# Patient Record
Sex: Female | Born: 1955 | Race: White | Hispanic: No | Marital: Single | Smoking: Current every day smoker
Health system: Southern US, Community
[De-identification: ages and names within clinical notes are randomized; demographics above are authoritative.]

## PROBLEM LIST (undated history)

## (undated) DIAGNOSIS — F329 Major depressive disorder, single episode, unspecified: Secondary | ICD-10-CM

## (undated) DIAGNOSIS — E119 Type 2 diabetes mellitus without complications: Secondary | ICD-10-CM

## (undated) DIAGNOSIS — Z96641 Presence of right artificial hip joint: Secondary | ICD-10-CM

## (undated) DIAGNOSIS — H3530 Unspecified macular degeneration: Secondary | ICD-10-CM

## (undated) DIAGNOSIS — G4730 Sleep apnea, unspecified: Secondary | ICD-10-CM

## (undated) DIAGNOSIS — M1990 Unspecified osteoarthritis, unspecified site: Secondary | ICD-10-CM

## (undated) DIAGNOSIS — G2581 Restless legs syndrome: Secondary | ICD-10-CM

## (undated) DIAGNOSIS — F419 Anxiety disorder, unspecified: Secondary | ICD-10-CM

## (undated) DIAGNOSIS — D649 Anemia, unspecified: Secondary | ICD-10-CM

## (undated) DIAGNOSIS — R51 Headache: Secondary | ICD-10-CM

## (undated) DIAGNOSIS — T8859XA Other complications of anesthesia, initial encounter: Secondary | ICD-10-CM

## (undated) DIAGNOSIS — G40909 Epilepsy, unspecified, not intractable, without status epilepticus: Secondary | ICD-10-CM

## (undated) DIAGNOSIS — F32A Depression, unspecified: Secondary | ICD-10-CM

## (undated) DIAGNOSIS — G4733 Obstructive sleep apnea (adult) (pediatric): Secondary | ICD-10-CM

## (undated) DIAGNOSIS — Z9889 Other specified postprocedural states: Secondary | ICD-10-CM

## (undated) DIAGNOSIS — T7840XA Allergy, unspecified, initial encounter: Secondary | ICD-10-CM

## (undated) DIAGNOSIS — R569 Unspecified convulsions: Secondary | ICD-10-CM

## (undated) DIAGNOSIS — S069XAA Unspecified intracranial injury with loss of consciousness status unknown, initial encounter: Secondary | ICD-10-CM

## (undated) DIAGNOSIS — M545 Low back pain, unspecified: Secondary | ICD-10-CM

## (undated) DIAGNOSIS — S0990XA Unspecified injury of head, initial encounter: Secondary | ICD-10-CM

## (undated) DIAGNOSIS — IMO0001 Reserved for inherently not codable concepts without codable children: Secondary | ICD-10-CM

## (undated) DIAGNOSIS — I4891 Unspecified atrial fibrillation: Secondary | ICD-10-CM

## (undated) DIAGNOSIS — Z8669 Personal history of other diseases of the nervous system and sense organs: Secondary | ICD-10-CM

## (undated) DIAGNOSIS — T4145XA Adverse effect of unspecified anesthetic, initial encounter: Secondary | ICD-10-CM

## (undated) DIAGNOSIS — G709 Myoneural disorder, unspecified: Secondary | ICD-10-CM

## (undated) DIAGNOSIS — J439 Emphysema, unspecified: Secondary | ICD-10-CM

## (undated) DIAGNOSIS — R519 Headache, unspecified: Secondary | ICD-10-CM

## (undated) DIAGNOSIS — K219 Gastro-esophageal reflux disease without esophagitis: Secondary | ICD-10-CM

## (undated) DIAGNOSIS — J449 Chronic obstructive pulmonary disease, unspecified: Secondary | ICD-10-CM

## (undated) DIAGNOSIS — I10 Essential (primary) hypertension: Secondary | ICD-10-CM

## (undated) DIAGNOSIS — S069X9A Unspecified intracranial injury with loss of consciousness of unspecified duration, initial encounter: Secondary | ICD-10-CM

## (undated) HISTORY — DX: Obstructive sleep apnea (adult) (pediatric): G47.33

## (undated) HISTORY — DX: Epilepsy, unspecified, not intractable, without status epilepticus: G40.909

## (undated) HISTORY — PX: TONSILLECTOMY: SUR1361

## (undated) HISTORY — DX: Unspecified convulsions: R56.9

## (undated) HISTORY — DX: Sleep apnea, unspecified: G47.30

## (undated) HISTORY — DX: Type 2 diabetes mellitus without complications: E11.9

## (undated) HISTORY — DX: Major depressive disorder, single episode, unspecified: F32.9

## (undated) HISTORY — DX: Personal history of other diseases of the nervous system and sense organs: Z86.69

## (undated) HISTORY — DX: Gastro-esophageal reflux disease without esophagitis: K21.9

## (undated) HISTORY — DX: Chronic obstructive pulmonary disease, unspecified: J44.9

## (undated) HISTORY — DX: Depression, unspecified: F32.A

## (undated) HISTORY — DX: Unspecified injury of head, initial encounter: S09.90XA

## (undated) HISTORY — DX: Unspecified osteoarthritis, unspecified site: M19.90

## (undated) HISTORY — DX: Allergy, unspecified, initial encounter: T78.40XA

## (undated) HISTORY — DX: Restless legs syndrome: G25.81

## (undated) HISTORY — PX: ROTATOR CUFF REPAIR: SHX139

## (undated) HISTORY — PX: ABDOMINAL HYSTERECTOMY: SHX81

## (undated) HISTORY — DX: Reserved for inherently not codable concepts without codable children: IMO0001

## (undated) HISTORY — PX: REVISION TOTAL HIP ARTHROPLASTY: SHX766

## (undated) HISTORY — DX: Emphysema, unspecified: J43.9

## (undated) HISTORY — DX: Myoneural disorder, unspecified: G70.9

## (undated) HISTORY — DX: Unspecified intracranial injury with loss of consciousness of unspecified duration, initial encounter: S06.9X9A

## (undated) HISTORY — DX: Anemia, unspecified: D64.9

## (undated) HISTORY — PX: VESICOVAGINAL FISTULA CLOSURE W/ TAH: SUR271

## (undated) HISTORY — DX: Low back pain: M54.5

## (undated) HISTORY — DX: Low back pain, unspecified: M54.50

## (undated) HISTORY — DX: Anxiety disorder, unspecified: F41.9

## (undated) HISTORY — PX: APPENDECTOMY: SHX54

## (undated) HISTORY — DX: Unspecified macular degeneration: H35.30

## (undated) HISTORY — DX: Unspecified intracranial injury with loss of consciousness status unknown, initial encounter: S06.9XAA

---

## 2000-03-25 ENCOUNTER — Other Ambulatory Visit: Admission: RE | Admit: 2000-03-25 | Discharge: 2000-03-25 | Payer: Self-pay | Admitting: Gynecology

## 2000-12-27 ENCOUNTER — Ambulatory Visit (HOSPITAL_COMMUNITY): Admission: RE | Admit: 2000-12-27 | Discharge: 2000-12-27 | Payer: Self-pay | Admitting: Family Medicine

## 2000-12-27 ENCOUNTER — Encounter: Payer: Self-pay | Admitting: Family Medicine

## 2001-01-18 ENCOUNTER — Ambulatory Visit (HOSPITAL_COMMUNITY): Admission: RE | Admit: 2001-01-18 | Discharge: 2001-01-18 | Payer: Self-pay | Admitting: Internal Medicine

## 2001-08-08 ENCOUNTER — Encounter: Payer: Self-pay | Admitting: Family Medicine

## 2001-08-08 ENCOUNTER — Ambulatory Visit (HOSPITAL_COMMUNITY): Admission: RE | Admit: 2001-08-08 | Discharge: 2001-08-08 | Payer: Self-pay | Admitting: Family Medicine

## 2001-08-09 ENCOUNTER — Encounter (HOSPITAL_COMMUNITY): Admission: RE | Admit: 2001-08-09 | Discharge: 2001-09-08 | Payer: Self-pay | Admitting: Oncology

## 2001-08-09 ENCOUNTER — Encounter: Admission: RE | Admit: 2001-08-09 | Discharge: 2001-08-09 | Payer: Self-pay | Admitting: Oncology

## 2001-11-08 ENCOUNTER — Encounter: Admission: RE | Admit: 2001-11-08 | Discharge: 2001-11-08 | Payer: Self-pay | Admitting: Oncology

## 2001-11-08 ENCOUNTER — Encounter (HOSPITAL_COMMUNITY): Admission: RE | Admit: 2001-11-08 | Discharge: 2001-12-08 | Payer: Self-pay | Admitting: Oncology

## 2001-12-27 ENCOUNTER — Encounter: Admission: RE | Admit: 2001-12-27 | Discharge: 2001-12-27 | Payer: Self-pay | Admitting: Oncology

## 2001-12-27 ENCOUNTER — Encounter (HOSPITAL_COMMUNITY): Admission: RE | Admit: 2001-12-27 | Discharge: 2002-01-26 | Payer: Self-pay | Admitting: Oncology

## 2002-02-09 ENCOUNTER — Encounter (HOSPITAL_COMMUNITY): Admission: RE | Admit: 2002-02-09 | Discharge: 2002-03-11 | Payer: Self-pay | Admitting: Oncology

## 2002-02-09 ENCOUNTER — Encounter: Admission: RE | Admit: 2002-02-09 | Discharge: 2002-02-09 | Payer: Self-pay | Admitting: Oncology

## 2002-07-17 ENCOUNTER — Ambulatory Visit (HOSPITAL_COMMUNITY): Admission: RE | Admit: 2002-07-17 | Discharge: 2002-07-17 | Payer: Self-pay | Admitting: Internal Medicine

## 2002-07-21 ENCOUNTER — Ambulatory Visit (HOSPITAL_COMMUNITY): Admission: RE | Admit: 2002-07-21 | Discharge: 2002-07-21 | Payer: Self-pay | Admitting: Internal Medicine

## 2002-10-12 ENCOUNTER — Encounter (HOSPITAL_COMMUNITY): Admission: RE | Admit: 2002-10-12 | Discharge: 2002-11-11 | Payer: Self-pay | Admitting: Oncology

## 2002-10-12 ENCOUNTER — Encounter: Admission: RE | Admit: 2002-10-12 | Discharge: 2002-10-12 | Payer: Self-pay | Admitting: Oncology

## 2002-10-13 ENCOUNTER — Encounter (INDEPENDENT_AMBULATORY_CARE_PROVIDER_SITE_OTHER): Payer: Self-pay | Admitting: Internal Medicine

## 2002-10-13 ENCOUNTER — Encounter: Payer: Self-pay | Admitting: Family Medicine

## 2002-10-13 ENCOUNTER — Ambulatory Visit (HOSPITAL_COMMUNITY): Admission: RE | Admit: 2002-10-13 | Discharge: 2002-10-13 | Payer: Self-pay | Admitting: Family Medicine

## 2002-10-18 ENCOUNTER — Encounter: Payer: Self-pay | Admitting: Orthopedic Surgery

## 2002-10-18 ENCOUNTER — Ambulatory Visit: Admission: RE | Admit: 2002-10-18 | Discharge: 2002-10-18 | Payer: Self-pay | Admitting: Orthopedic Surgery

## 2002-11-16 ENCOUNTER — Ambulatory Visit (HOSPITAL_COMMUNITY): Admission: RE | Admit: 2002-11-16 | Discharge: 2002-11-16 | Payer: Self-pay | Admitting: Internal Medicine

## 2002-11-16 HISTORY — PX: ESOPHAGOGASTRODUODENOSCOPY: SHX1529

## 2003-03-10 DIAGNOSIS — S0990XA Unspecified injury of head, initial encounter: Secondary | ICD-10-CM

## 2003-03-10 HISTORY — DX: Unspecified injury of head, initial encounter: S09.90XA

## 2003-03-29 ENCOUNTER — Ambulatory Visit (HOSPITAL_COMMUNITY): Admission: RE | Admit: 2003-03-29 | Discharge: 2003-03-29 | Payer: Self-pay | Admitting: Orthopedic Surgery

## 2003-04-04 ENCOUNTER — Encounter (HOSPITAL_COMMUNITY): Admission: RE | Admit: 2003-04-04 | Discharge: 2003-05-04 | Payer: Self-pay | Admitting: Orthopedic Surgery

## 2003-04-11 ENCOUNTER — Encounter: Payer: Self-pay | Admitting: Orthopedic Surgery

## 2003-04-11 ENCOUNTER — Observation Stay (HOSPITAL_COMMUNITY): Admission: RE | Admit: 2003-04-11 | Discharge: 2003-04-12 | Payer: Self-pay | Admitting: Orthopedic Surgery

## 2003-05-09 ENCOUNTER — Encounter (HOSPITAL_COMMUNITY): Admission: RE | Admit: 2003-05-09 | Discharge: 2003-06-08 | Payer: Self-pay | Admitting: Orthopedic Surgery

## 2003-05-09 ENCOUNTER — Encounter: Admission: RE | Admit: 2003-05-09 | Discharge: 2003-05-09 | Payer: Self-pay | Admitting: Oncology

## 2003-05-09 ENCOUNTER — Encounter (HOSPITAL_COMMUNITY): Admission: RE | Admit: 2003-05-09 | Discharge: 2003-06-08 | Payer: Self-pay | Admitting: Oncology

## 2003-06-11 ENCOUNTER — Encounter (HOSPITAL_COMMUNITY): Admission: RE | Admit: 2003-06-11 | Discharge: 2003-07-11 | Payer: Self-pay | Admitting: Orthopedic Surgery

## 2003-07-10 ENCOUNTER — Encounter (HOSPITAL_COMMUNITY): Admission: RE | Admit: 2003-07-10 | Discharge: 2003-08-09 | Payer: Self-pay | Admitting: Orthopedic Surgery

## 2003-07-19 ENCOUNTER — Other Ambulatory Visit: Admission: RE | Admit: 2003-07-19 | Discharge: 2003-07-19 | Payer: Self-pay | Admitting: Gynecology

## 2003-08-09 ENCOUNTER — Encounter (HOSPITAL_COMMUNITY): Admission: RE | Admit: 2003-08-09 | Discharge: 2003-09-08 | Payer: Self-pay | Admitting: Orthopedic Surgery

## 2003-09-11 ENCOUNTER — Encounter (HOSPITAL_COMMUNITY): Admission: RE | Admit: 2003-09-11 | Discharge: 2003-10-11 | Payer: Self-pay | Admitting: Orthopedic Surgery

## 2003-10-12 ENCOUNTER — Encounter (HOSPITAL_COMMUNITY): Admission: RE | Admit: 2003-10-12 | Discharge: 2003-11-11 | Payer: Self-pay | Admitting: Orthopedic Surgery

## 2003-11-26 ENCOUNTER — Encounter (HOSPITAL_COMMUNITY): Admission: RE | Admit: 2003-11-26 | Discharge: 2003-12-07 | Payer: Self-pay | Admitting: Oncology

## 2003-11-26 ENCOUNTER — Encounter: Admission: RE | Admit: 2003-11-26 | Discharge: 2003-12-07 | Payer: Self-pay | Admitting: Oncology

## 2003-12-06 ENCOUNTER — Ambulatory Visit (HOSPITAL_COMMUNITY): Admission: RE | Admit: 2003-12-06 | Discharge: 2003-12-06 | Payer: Self-pay | Admitting: Orthopedic Surgery

## 2003-12-24 ENCOUNTER — Encounter: Admission: RE | Admit: 2003-12-24 | Discharge: 2003-12-24 | Payer: Self-pay | Admitting: Orthopedic Surgery

## 2004-01-02 ENCOUNTER — Encounter: Payer: Self-pay | Admitting: Orthopedic Surgery

## 2004-01-07 ENCOUNTER — Encounter: Admission: RE | Admit: 2004-01-07 | Discharge: 2004-01-07 | Payer: Self-pay | Admitting: Orthopedic Surgery

## 2004-01-22 ENCOUNTER — Encounter: Admission: RE | Admit: 2004-01-22 | Discharge: 2004-01-22 | Payer: Self-pay | Admitting: Orthopedic Surgery

## 2004-10-06 ENCOUNTER — Emergency Department (HOSPITAL_COMMUNITY): Admission: EM | Admit: 2004-10-06 | Discharge: 2004-10-06 | Payer: Self-pay | Admitting: Emergency Medicine

## 2005-01-20 ENCOUNTER — Ambulatory Visit (HOSPITAL_COMMUNITY): Admission: RE | Admit: 2005-01-20 | Discharge: 2005-01-20 | Payer: Self-pay | Admitting: Family Medicine

## 2005-11-18 ENCOUNTER — Encounter: Admission: RE | Admit: 2005-11-18 | Discharge: 2005-12-04 | Payer: Self-pay | Admitting: Oncology

## 2005-11-18 ENCOUNTER — Encounter (HOSPITAL_COMMUNITY): Admission: RE | Admit: 2005-11-18 | Discharge: 2005-12-04 | Payer: Self-pay | Admitting: Oncology

## 2005-11-18 ENCOUNTER — Ambulatory Visit (HOSPITAL_COMMUNITY): Payer: Self-pay | Admitting: Oncology

## 2005-12-10 ENCOUNTER — Other Ambulatory Visit (HOSPITAL_COMMUNITY): Admission: RE | Admit: 2005-12-10 | Discharge: 2006-03-10 | Payer: Self-pay | Admitting: Psychiatry

## 2005-12-11 ENCOUNTER — Encounter (HOSPITAL_COMMUNITY): Admission: RE | Admit: 2005-12-11 | Discharge: 2006-01-10 | Payer: Self-pay | Admitting: Oncology

## 2005-12-11 ENCOUNTER — Encounter: Admission: RE | Admit: 2005-12-11 | Discharge: 2005-12-11 | Payer: Self-pay | Admitting: Oncology

## 2005-12-14 ENCOUNTER — Ambulatory Visit: Payer: Self-pay | Admitting: Psychiatry

## 2006-03-12 ENCOUNTER — Encounter (HOSPITAL_COMMUNITY): Admission: RE | Admit: 2006-03-12 | Discharge: 2006-04-11 | Payer: Self-pay | Admitting: Oncology

## 2006-03-12 ENCOUNTER — Ambulatory Visit (HOSPITAL_COMMUNITY): Payer: Self-pay | Admitting: Oncology

## 2006-04-20 ENCOUNTER — Ambulatory Visit: Payer: Self-pay | Admitting: Internal Medicine

## 2006-08-30 ENCOUNTER — Encounter (INDEPENDENT_AMBULATORY_CARE_PROVIDER_SITE_OTHER): Payer: Self-pay | Admitting: Internal Medicine

## 2007-02-07 HISTORY — PX: COLONOSCOPY WITH ESOPHAGOGASTRODUODENOSCOPY (EGD): SHX5779

## 2007-02-15 ENCOUNTER — Emergency Department (HOSPITAL_COMMUNITY): Admission: EM | Admit: 2007-02-15 | Discharge: 2007-02-15 | Payer: Self-pay | Admitting: Emergency Medicine

## 2007-02-15 ENCOUNTER — Ambulatory Visit: Payer: Self-pay | Admitting: Internal Medicine

## 2007-03-09 ENCOUNTER — Ambulatory Visit: Payer: Self-pay | Admitting: Internal Medicine

## 2007-03-09 ENCOUNTER — Ambulatory Visit (HOSPITAL_COMMUNITY): Admission: RE | Admit: 2007-03-09 | Discharge: 2007-03-09 | Payer: Self-pay | Admitting: Internal Medicine

## 2007-03-09 ENCOUNTER — Encounter: Payer: Self-pay | Admitting: Internal Medicine

## 2007-03-24 ENCOUNTER — Ambulatory Visit (HOSPITAL_COMMUNITY): Payer: Self-pay | Admitting: Oncology

## 2007-03-24 ENCOUNTER — Encounter (HOSPITAL_COMMUNITY): Admission: RE | Admit: 2007-03-24 | Discharge: 2007-04-23 | Payer: Self-pay | Admitting: Oncology

## 2007-03-28 ENCOUNTER — Ambulatory Visit: Payer: Self-pay | Admitting: Internal Medicine

## 2007-03-28 DIAGNOSIS — K219 Gastro-esophageal reflux disease without esophagitis: Secondary | ICD-10-CM

## 2007-03-28 DIAGNOSIS — M129 Arthropathy, unspecified: Secondary | ICD-10-CM | POA: Insufficient documentation

## 2007-03-28 DIAGNOSIS — F329 Major depressive disorder, single episode, unspecified: Secondary | ICD-10-CM

## 2007-03-28 DIAGNOSIS — J309 Allergic rhinitis, unspecified: Secondary | ICD-10-CM | POA: Insufficient documentation

## 2007-03-28 DIAGNOSIS — H3530 Unspecified macular degeneration: Secondary | ICD-10-CM

## 2007-03-28 DIAGNOSIS — S0990XA Unspecified injury of head, initial encounter: Secondary | ICD-10-CM | POA: Insufficient documentation

## 2007-03-28 DIAGNOSIS — J45909 Unspecified asthma, uncomplicated: Secondary | ICD-10-CM | POA: Insufficient documentation

## 2007-03-28 DIAGNOSIS — R5383 Other fatigue: Secondary | ICD-10-CM

## 2007-03-28 DIAGNOSIS — D649 Anemia, unspecified: Secondary | ICD-10-CM

## 2007-03-28 DIAGNOSIS — R5381 Other malaise: Secondary | ICD-10-CM | POA: Insufficient documentation

## 2007-03-28 DIAGNOSIS — M545 Low back pain: Secondary | ICD-10-CM

## 2007-03-28 DIAGNOSIS — F411 Generalized anxiety disorder: Secondary | ICD-10-CM | POA: Insufficient documentation

## 2007-03-28 DIAGNOSIS — R569 Unspecified convulsions: Secondary | ICD-10-CM

## 2007-03-28 DIAGNOSIS — R635 Abnormal weight gain: Secondary | ICD-10-CM

## 2007-03-29 ENCOUNTER — Telehealth (INDEPENDENT_AMBULATORY_CARE_PROVIDER_SITE_OTHER): Payer: Self-pay | Admitting: *Deleted

## 2007-03-30 LAB — CONVERTED CEMR LAB
ALT: 16 units/L (ref 0–35)
AST: 13 units/L (ref 0–37)
Albumin: 4.4 g/dL (ref 3.5–5.2)
Alkaline Phosphatase: 72 units/L (ref 39–117)
BUN: 21 mg/dL (ref 6–23)
Basophils Relative: 0 % (ref 0–1)
CO2: 18 meq/L — ABNORMAL LOW (ref 19–32)
Calcium: 9.5 mg/dL (ref 8.4–10.5)
Chloride: 103 meq/L (ref 96–112)
Creatinine, Ser: 0.7 mg/dL (ref 0.40–1.20)
Eosinophils Absolute: 0.1 10*3/uL (ref 0.0–0.7)
Eosinophils Relative: 1 % (ref 0–5)
Glucose, Bld: 134 mg/dL — ABNORMAL HIGH (ref 70–99)
HCT: 49.4 % — ABNORMAL HIGH (ref 36.0–46.0)
Hemoglobin: 16.6 g/dL — ABNORMAL HIGH (ref 12.0–15.0)
Lymphocytes Relative: 20 % (ref 12–46)
Lymphs Abs: 3.5 10*3/uL (ref 0.7–4.0)
MCHC: 33.6 g/dL (ref 30.0–36.0)
Monocytes Absolute: 0.8 10*3/uL (ref 0.1–1.0)
Monocytes Relative: 5 % (ref 3–12)
Neutro Abs: 13.2 10*3/uL — ABNORMAL HIGH (ref 1.7–7.7)
RBC: 5.2 M/uL — ABNORMAL HIGH (ref 3.87–5.11)
RDW: 14.5 % (ref 11.5–15.5)
Sodium: 136 meq/L (ref 135–145)
TSH: 1 microintl units/mL (ref 0.350–5.50)
Total Bilirubin: 0.4 mg/dL (ref 0.3–1.2)
Total Protein: 7.5 g/dL (ref 6.0–8.3)
WBC: 17.7 10*3/uL — ABNORMAL HIGH (ref 4.0–10.5)

## 2007-03-31 ENCOUNTER — Ambulatory Visit: Payer: Self-pay | Admitting: Orthopedic Surgery

## 2007-03-31 DIAGNOSIS — M5430 Sciatica, unspecified side: Secondary | ICD-10-CM

## 2007-03-31 DIAGNOSIS — M549 Dorsalgia, unspecified: Secondary | ICD-10-CM | POA: Insufficient documentation

## 2007-04-01 ENCOUNTER — Encounter (INDEPENDENT_AMBULATORY_CARE_PROVIDER_SITE_OTHER): Payer: Self-pay | Admitting: Internal Medicine

## 2007-04-06 ENCOUNTER — Encounter (INDEPENDENT_AMBULATORY_CARE_PROVIDER_SITE_OTHER): Payer: Self-pay | Admitting: Internal Medicine

## 2007-04-15 ENCOUNTER — Ambulatory Visit: Admission: RE | Admit: 2007-04-15 | Discharge: 2007-04-15 | Payer: Self-pay | Admitting: Internal Medicine

## 2007-04-15 ENCOUNTER — Encounter (INDEPENDENT_AMBULATORY_CARE_PROVIDER_SITE_OTHER): Payer: Self-pay | Admitting: Internal Medicine

## 2007-04-21 ENCOUNTER — Ambulatory Visit: Payer: Self-pay | Admitting: Orthopedic Surgery

## 2007-04-26 ENCOUNTER — Telehealth: Payer: Self-pay | Admitting: Orthopedic Surgery

## 2007-04-26 ENCOUNTER — Ambulatory Visit: Payer: Self-pay | Admitting: Pulmonary Disease

## 2007-04-28 ENCOUNTER — Ambulatory Visit (HOSPITAL_COMMUNITY): Admission: RE | Admit: 2007-04-28 | Discharge: 2007-04-28 | Payer: Self-pay | Admitting: Orthopedic Surgery

## 2007-05-10 ENCOUNTER — Telehealth (INDEPENDENT_AMBULATORY_CARE_PROVIDER_SITE_OTHER): Payer: Self-pay | Admitting: *Deleted

## 2007-05-16 ENCOUNTER — Ambulatory Visit: Payer: Self-pay | Admitting: Orthopedic Surgery

## 2007-05-16 DIAGNOSIS — M1610 Unilateral primary osteoarthritis, unspecified hip: Secondary | ICD-10-CM | POA: Insufficient documentation

## 2007-05-16 DIAGNOSIS — M169 Osteoarthritis of hip, unspecified: Secondary | ICD-10-CM | POA: Insufficient documentation

## 2007-05-17 ENCOUNTER — Encounter (INDEPENDENT_AMBULATORY_CARE_PROVIDER_SITE_OTHER): Payer: Self-pay | Admitting: Internal Medicine

## 2007-05-18 ENCOUNTER — Ambulatory Visit: Payer: Self-pay | Admitting: Internal Medicine

## 2007-05-18 DIAGNOSIS — G4730 Sleep apnea, unspecified: Secondary | ICD-10-CM | POA: Insufficient documentation

## 2007-05-18 DIAGNOSIS — G2581 Restless legs syndrome: Secondary | ICD-10-CM | POA: Insufficient documentation

## 2007-05-18 DIAGNOSIS — R519 Headache, unspecified: Secondary | ICD-10-CM | POA: Insufficient documentation

## 2007-05-18 DIAGNOSIS — R51 Headache: Secondary | ICD-10-CM | POA: Insufficient documentation

## 2007-05-19 ENCOUNTER — Ambulatory Visit (HOSPITAL_COMMUNITY): Admission: RE | Admit: 2007-05-19 | Discharge: 2007-05-19 | Payer: Self-pay | Admitting: Orthopedic Surgery

## 2007-06-06 ENCOUNTER — Ambulatory Visit: Payer: Self-pay | Admitting: Orthopedic Surgery

## 2007-06-08 ENCOUNTER — Telehealth: Payer: Self-pay | Admitting: Orthopedic Surgery

## 2007-06-13 ENCOUNTER — Ambulatory Visit (HOSPITAL_COMMUNITY): Admission: RE | Admit: 2007-06-13 | Discharge: 2007-06-13 | Payer: Self-pay | Admitting: Orthopedic Surgery

## 2007-06-29 ENCOUNTER — Ambulatory Visit: Payer: Self-pay | Admitting: Orthopedic Surgery

## 2007-06-29 DIAGNOSIS — M5126 Other intervertebral disc displacement, lumbar region: Secondary | ICD-10-CM

## 2007-07-12 ENCOUNTER — Telehealth: Payer: Self-pay | Admitting: Orthopedic Surgery

## 2007-07-18 ENCOUNTER — Encounter: Admission: RE | Admit: 2007-07-18 | Discharge: 2007-07-18 | Payer: Self-pay | Admitting: Orthopedic Surgery

## 2007-07-21 ENCOUNTER — Encounter: Payer: Self-pay | Admitting: Orthopedic Surgery

## 2007-07-27 ENCOUNTER — Telehealth (INDEPENDENT_AMBULATORY_CARE_PROVIDER_SITE_OTHER): Payer: Self-pay | Admitting: Internal Medicine

## 2007-08-22 ENCOUNTER — Ambulatory Visit: Payer: Self-pay | Admitting: Orthopedic Surgery

## 2007-09-23 ENCOUNTER — Encounter: Payer: Self-pay | Admitting: Orthopedic Surgery

## 2007-10-05 ENCOUNTER — Encounter: Payer: Self-pay | Admitting: Orthopedic Surgery

## 2007-10-07 ENCOUNTER — Encounter: Payer: Self-pay | Admitting: Orthopedic Surgery

## 2007-11-01 ENCOUNTER — Encounter: Payer: Self-pay | Admitting: Orthopedic Surgery

## 2007-11-29 ENCOUNTER — Ambulatory Visit: Payer: Self-pay | Admitting: Internal Medicine

## 2007-11-30 LAB — CONVERTED CEMR LAB
ALT: 25 units/L (ref 0–35)
AST: 18 units/L (ref 0–37)
Albumin: 4.1 g/dL (ref 3.5–5.2)
Alkaline Phosphatase: 65 units/L (ref 39–117)
BUN: 8 mg/dL (ref 6–23)
Basophils Absolute: 0.1 10*3/uL (ref 0.0–0.1)
Basophils Relative: 0 % (ref 0–1)
CO2: 23 meq/L (ref 19–32)
Calcium: 9.2 mg/dL (ref 8.4–10.5)
Chloride: 104 meq/L (ref 96–112)
Creatinine, Ser: 0.71 mg/dL (ref 0.40–1.20)
Eosinophils Absolute: 0.2 10*3/uL (ref 0.0–0.7)
Eosinophils Relative: 1 % (ref 0–5)
Glucose, Bld: 165 mg/dL — ABNORMAL HIGH (ref 70–99)
HCT: 49.8 % — ABNORMAL HIGH (ref 36.0–46.0)
Hemoglobin: 17.2 g/dL — ABNORMAL HIGH (ref 12.0–15.0)
Lymphocytes Relative: 28 % (ref 12–46)
Lymphs Abs: 3.3 10*3/uL (ref 0.7–4.0)
MCHC: 34.5 g/dL (ref 30.0–36.0)
MCV: 94 fL (ref 78.0–100.0)
Monocytes Absolute: 0.9 10*3/uL (ref 0.1–1.0)
Monocytes Relative: 8 % (ref 3–12)
Neutro Abs: 7.3 10*3/uL (ref 1.7–7.7)
Neutrophils Relative %: 63 % (ref 43–77)
Platelets: 309 10*3/uL (ref 150–400)
Potassium: 4.2 meq/L (ref 3.5–5.3)
RBC: 5.3 M/uL — ABNORMAL HIGH (ref 3.87–5.11)
RDW: 14.1 % (ref 11.5–15.5)
Sodium: 139 meq/L (ref 135–145)
Total Bilirubin: 0.4 mg/dL (ref 0.3–1.2)
Total Protein: 7 g/dL (ref 6.0–8.3)
WBC: 11.7 10*3/uL — ABNORMAL HIGH (ref 4.0–10.5)

## 2007-12-02 ENCOUNTER — Ambulatory Visit: Payer: Self-pay | Admitting: Internal Medicine

## 2007-12-02 DIAGNOSIS — E119 Type 2 diabetes mellitus without complications: Secondary | ICD-10-CM

## 2007-12-02 LAB — CONVERTED CEMR LAB: Blood Glucose, Fingerstick: 153

## 2007-12-13 ENCOUNTER — Telehealth: Payer: Self-pay | Admitting: Orthopedic Surgery

## 2007-12-16 ENCOUNTER — Encounter (INDEPENDENT_AMBULATORY_CARE_PROVIDER_SITE_OTHER): Payer: Self-pay | Admitting: Internal Medicine

## 2007-12-16 ENCOUNTER — Telehealth (INDEPENDENT_AMBULATORY_CARE_PROVIDER_SITE_OTHER): Payer: Self-pay | Admitting: Internal Medicine

## 2007-12-21 ENCOUNTER — Inpatient Hospital Stay (HOSPITAL_COMMUNITY): Admission: RE | Admit: 2007-12-21 | Discharge: 2007-12-25 | Payer: Self-pay | Admitting: Orthopedic Surgery

## 2007-12-26 ENCOUNTER — Telehealth (INDEPENDENT_AMBULATORY_CARE_PROVIDER_SITE_OTHER): Payer: Self-pay | Admitting: *Deleted

## 2008-01-20 ENCOUNTER — Encounter: Payer: Self-pay | Admitting: Orthopedic Surgery

## 2008-03-22 ENCOUNTER — Encounter (HOSPITAL_COMMUNITY): Admission: RE | Admit: 2008-03-22 | Discharge: 2008-04-21 | Payer: Self-pay | Admitting: Oncology

## 2008-03-23 ENCOUNTER — Ambulatory Visit: Payer: Self-pay | Admitting: Internal Medicine

## 2008-03-23 ENCOUNTER — Ambulatory Visit (HOSPITAL_COMMUNITY): Payer: Self-pay | Admitting: Oncology

## 2008-03-23 LAB — CONVERTED CEMR LAB
Blood Glucose, Fingerstick: 159
Hgb A1c MFr Bld: 7 %

## 2008-03-26 LAB — CONVERTED CEMR LAB
ALT: 21 units/L (ref 0–35)
AST: 17 units/L (ref 0–37)
Albumin: 4.1 g/dL (ref 3.5–5.2)
Alkaline Phosphatase: 74 units/L (ref 39–117)
BUN: 14 mg/dL (ref 6–23)
CO2: 19 meq/L (ref 19–32)
Calcium: 8.9 mg/dL (ref 8.4–10.5)
Chloride: 107 meq/L (ref 96–112)
Cholesterol: 215 mg/dL — ABNORMAL HIGH (ref 0–200)
Creatinine, Ser: 0.55 mg/dL (ref 0.40–1.20)
Glucose, Bld: 140 mg/dL — ABNORMAL HIGH (ref 70–99)
HDL: 34 mg/dL — ABNORMAL LOW (ref >39)
Potassium: 4.3 meq/L (ref 3.5–5.3)
Sodium: 143 meq/L (ref 135–145)
Total Bilirubin: 0.4 mg/dL (ref 0.3–1.2)
Total CHOL/HDL Ratio: 6.3
Total Protein: 6.9 g/dL (ref 6.0–8.3)
Triglycerides: 493 mg/dL — ABNORMAL HIGH (ref <150)

## 2008-05-02 ENCOUNTER — Ambulatory Visit: Payer: Self-pay | Admitting: Orthopedic Surgery

## 2008-05-02 DIAGNOSIS — M25819 Other specified joint disorders, unspecified shoulder: Secondary | ICD-10-CM | POA: Insufficient documentation

## 2008-05-02 DIAGNOSIS — M542 Cervicalgia: Secondary | ICD-10-CM | POA: Insufficient documentation

## 2008-05-02 DIAGNOSIS — M25519 Pain in unspecified shoulder: Secondary | ICD-10-CM

## 2008-05-02 DIAGNOSIS — M7580 Other shoulder lesions, unspecified shoulder: Secondary | ICD-10-CM

## 2008-05-08 ENCOUNTER — Telehealth: Payer: Self-pay | Admitting: Orthopedic Surgery

## 2008-05-10 ENCOUNTER — Ambulatory Visit (HOSPITAL_COMMUNITY): Admission: RE | Admit: 2008-05-10 | Discharge: 2008-05-10 | Payer: Self-pay | Admitting: Orthopedic Surgery

## 2008-05-21 ENCOUNTER — Ambulatory Visit: Payer: Self-pay | Admitting: Orthopedic Surgery

## 2008-06-29 ENCOUNTER — Encounter: Payer: Self-pay | Admitting: Orthopedic Surgery

## 2008-07-12 ENCOUNTER — Encounter: Payer: Self-pay | Admitting: Orthopedic Surgery

## 2008-07-18 ENCOUNTER — Emergency Department (HOSPITAL_COMMUNITY): Admission: EM | Admit: 2008-07-18 | Discharge: 2008-07-18 | Payer: Self-pay | Admitting: Emergency Medicine

## 2008-08-01 ENCOUNTER — Telehealth (INDEPENDENT_AMBULATORY_CARE_PROVIDER_SITE_OTHER): Payer: Self-pay | Admitting: *Deleted

## 2008-08-01 ENCOUNTER — Ambulatory Visit: Payer: Self-pay | Admitting: Internal Medicine

## 2008-10-24 ENCOUNTER — Ambulatory Visit: Payer: Self-pay | Admitting: Internal Medicine

## 2008-12-04 ENCOUNTER — Encounter (INDEPENDENT_AMBULATORY_CARE_PROVIDER_SITE_OTHER): Payer: Self-pay | Admitting: Internal Medicine

## 2008-12-31 ENCOUNTER — Ambulatory Visit: Payer: Self-pay | Admitting: Orthopedic Surgery

## 2009-01-08 ENCOUNTER — Telehealth: Payer: Self-pay | Admitting: Orthopedic Surgery

## 2009-01-16 ENCOUNTER — Encounter: Payer: Self-pay | Admitting: Orthopedic Surgery

## 2009-01-18 ENCOUNTER — Encounter: Payer: Self-pay | Admitting: Orthopedic Surgery

## 2009-01-21 ENCOUNTER — Encounter (HOSPITAL_COMMUNITY): Admission: RE | Admit: 2009-01-21 | Discharge: 2009-02-20 | Payer: Self-pay | Admitting: Orthopedic Surgery

## 2009-01-21 ENCOUNTER — Encounter: Payer: Self-pay | Admitting: Orthopedic Surgery

## 2009-02-11 ENCOUNTER — Ambulatory Visit: Payer: Self-pay | Admitting: Orthopedic Surgery

## 2009-02-15 ENCOUNTER — Telehealth: Payer: Self-pay | Admitting: Orthopedic Surgery

## 2009-02-20 ENCOUNTER — Ambulatory Visit (HOSPITAL_COMMUNITY): Admission: RE | Admit: 2009-02-20 | Discharge: 2009-02-20 | Payer: Self-pay | Admitting: Orthopedic Surgery

## 2009-02-22 ENCOUNTER — Telehealth: Payer: Self-pay | Admitting: Orthopedic Surgery

## 2009-02-25 ENCOUNTER — Telehealth: Payer: Self-pay | Admitting: Orthopedic Surgery

## 2009-02-26 ENCOUNTER — Telehealth: Payer: Self-pay | Admitting: Orthopedic Surgery

## 2009-03-06 ENCOUNTER — Ambulatory Visit: Payer: Self-pay | Admitting: Orthopedic Surgery

## 2009-03-06 DIAGNOSIS — M75120 Complete rotator cuff tear or rupture of unspecified shoulder, not specified as traumatic: Secondary | ICD-10-CM | POA: Insufficient documentation

## 2009-03-06 DIAGNOSIS — M19019 Primary osteoarthritis, unspecified shoulder: Secondary | ICD-10-CM | POA: Insufficient documentation

## 2009-03-15 ENCOUNTER — Ambulatory Visit: Payer: Self-pay | Admitting: Orthopedic Surgery

## 2009-03-15 ENCOUNTER — Encounter: Payer: Self-pay | Admitting: Orthopedic Surgery

## 2009-03-15 ENCOUNTER — Inpatient Hospital Stay (HOSPITAL_COMMUNITY): Admission: RE | Admit: 2009-03-15 | Discharge: 2009-03-18 | Payer: Self-pay | Admitting: Orthopedic Surgery

## 2009-03-15 ENCOUNTER — Telehealth (INDEPENDENT_AMBULATORY_CARE_PROVIDER_SITE_OTHER): Payer: Self-pay | Admitting: *Deleted

## 2009-03-21 ENCOUNTER — Ambulatory Visit: Payer: Self-pay | Admitting: Orthopedic Surgery

## 2009-03-28 ENCOUNTER — Ambulatory Visit: Payer: Self-pay | Admitting: Orthopedic Surgery

## 2009-04-05 ENCOUNTER — Telehealth: Payer: Self-pay | Admitting: Orthopedic Surgery

## 2009-04-22 ENCOUNTER — Ambulatory Visit: Payer: Self-pay | Admitting: Orthopedic Surgery

## 2009-05-08 ENCOUNTER — Ambulatory Visit (HOSPITAL_COMMUNITY): Payer: Self-pay | Admitting: Oncology

## 2009-05-08 ENCOUNTER — Encounter (HOSPITAL_COMMUNITY): Admission: RE | Admit: 2009-05-08 | Discharge: 2009-06-07 | Payer: Self-pay | Admitting: Oncology

## 2009-06-23 ENCOUNTER — Emergency Department (HOSPITAL_COMMUNITY): Admission: EM | Admit: 2009-06-23 | Discharge: 2009-06-24 | Payer: Self-pay | Admitting: Emergency Medicine

## 2009-07-03 ENCOUNTER — Ambulatory Visit: Payer: Self-pay | Admitting: Orthopedic Surgery

## 2009-09-04 ENCOUNTER — Telehealth: Payer: Self-pay | Admitting: Orthopedic Surgery

## 2009-10-01 ENCOUNTER — Encounter: Payer: Self-pay | Admitting: Orthopedic Surgery

## 2010-03-20 ENCOUNTER — Ambulatory Visit: Admit: 2010-03-20 | Payer: Self-pay | Admitting: Gastroenterology

## 2010-03-30 ENCOUNTER — Encounter: Payer: Self-pay | Admitting: Orthopedic Surgery

## 2010-04-03 ENCOUNTER — Ambulatory Visit
Admission: RE | Admit: 2010-04-03 | Discharge: 2010-04-03 | Payer: Self-pay | Source: Home / Self Care | Attending: Gastroenterology | Admitting: Gastroenterology

## 2010-04-03 ENCOUNTER — Encounter: Payer: Self-pay | Admitting: Internal Medicine

## 2010-04-03 DIAGNOSIS — K2270 Barrett's esophagus without dysplasia: Secondary | ICD-10-CM | POA: Insufficient documentation

## 2010-04-08 NOTE — Letter (Signed)
Summary: Medical record request [employer]  Medical record request [employer]   Imported By: Cammie Sickle 07/17/2009 13:15:02  _____________________________________________________________________  External Attachment:    Type:   Image     Comment:   External Document

## 2010-04-08 NOTE — Assessment & Plan Note (Signed)
Summary: 6 wk reck lt shuolder post op surg 03/15/09/bsf   Visit Type:  Follow-up  CC:  post op left shoulder.  History of Present Illness: status post LEFT open rotator cuff repair on March 15, 2009.  Current medications are Demerol and Robaxin.  The patient started a home therapy program and has done very well.  She status post RIGHT total hip arthroplasty by Dr. Malena Catholic.  She is seen him recently and was evaluated for LEFT groin pain x-rays were negative  He is doing very well she looks better she was able to receive her disability.  She has 35 of external rotation.  90 of abduction.  125 of forward elevation.  Her shoulder incision looks good.  She's improving recommend continued physical therapy refill Demerol   followup in 3 months       Allergies: 1)  ! Pcn 2)  Codeine   Impression & Recommendations:  Problem # 1:  AFTERCARE FOLLOW SURGERY MUSCULOSKEL SYSTEM NEC (ICD-V58.78) Assessment Improved  Orders: Post-Op Check (16109)  Problem # 2:  RUPTURE ROTATOR CUFF (ICD-727.61) Assessment: Comment Only  Orders: Post-Op Check (60454)  Problem # 3:  SHOULDER, ARTHRITIS, DEGEN./OSTEO (ICD-715.91) Assessment: Comment Only  Orders: Post-Op Check (09811)  Medications Added to Medication List This Visit: 1)  Ritalin 10 Mg Tabs (Methylphenidate hcl) 2)  Alprazolam 1 Mg Tabs (Alprazolam) .... One by mouth tid  Patient Instructions: 1)  Please schedule a follow-up appointment in 3 months. 2)  Continue Home Exercise program  Prescriptions: DEMEROL 50 MG TABS (MEPERIDINE HCL) one by mouth q 6 hrs as needed pain  #180 x 0   Entered and Authorized by:   Fuller Canada MD   Signed by:   Fuller Canada MD on 07/03/2009   Method used:   Print then Give to Patient   RxID:   [id number]

## 2010-04-08 NOTE — Progress Notes (Signed)
Summary: wants prescription for Demerol  Phone Note Call from Patient   Summary of Call: Adrienne [name] (22-Jun-1955)  You wrote a prescription for Demerol #180 but [facility] would only fill for #120 due to insurance. Now they will not refill for the 60 remaining pills on that prescription.  She needs a new written prescription for the Demerol. [name] in [place] Her # [phone] Initial call taken by: Jacklynn Ganong,  September 04, 2009 2:57 PM  Follow-up for Phone Call        ok check with pharmacy then refill Follow-up by: Fuller Canada MD,  September 04, 2009 2:57 PM  Additional Follow-up for Phone Call Additional follow up Details #1::        done Additional Follow-up by: Fuller Canada MD,  September 04, 2009 4:07 PM

## 2010-04-08 NOTE — Letter (Signed)
Summary: Medical record request [employer]  Medical record request [employer]   Imported By: Cammie Sickle 03/12/2009 18:17:59  _____________________________________________________________________  External Attachment:    Type:   Image     Comment:   External Document

## 2010-04-08 NOTE — Letter (Signed)
Summary: No show for appointment  No show for appointment   Imported By: Cammie Sickle 10/08/2009 18:10:44  _____________________________________________________________________  External Attachment:    Type:   Image     Comment:   External Document

## 2010-04-08 NOTE — Assessment & Plan Note (Signed)
Summary: POST OP 2/SURG 03/15/09/REM STAPLES/[employer]/CAF   Visit Type:  Follow-up  CC:  postop visit.  History of Present Illness: I saw Adrienne [name] in the office today for a followup visit.  She is a 55 years old woman with the complaint of:  DOS left shoulder 03/15/09.  Procedure    Arthroscopy of left shoulder, open left rotator cuff repair with removal of distal clavicle.  Medication Demerol, Robaxin, Duragesic patch.  Subjectives postop 2 removal of staples.  Adrienne [name] is improving.  She has less pain less swelling.  She is using the Cryo/Cuff and doing much better.  Incision looks good staples were removed  Continue sling follow up 2 weeks and we will start some therapy at home  Allergies: 1)  ! Pcn 2)  Codeine   Impression & Recommendations:  Problem # 1:  AFTERCARE FOLLOW SURGERY MUSCULOSKEL SYSTEM NEC (ICD-V58.78) Assessment Improved  Orders: Post-Op Check (16109)  Medications Added to Medication List This Visit: 1)  Robaxin 500 Mg Tabs (Methocarbamol) .[name].. 1 by mouth q 6 as needed  Patient Instructions: 1)  Please schedule a follow-up appointment in 2 weeks. 2)  start Home program next time ! Prescriptions: DEMEROL 50 MG TABS (MEPERIDINE HCL) one by mouth q 6 hrs as needed pain  #90 x 0   Entered and Authorized by:   Fuller Canada MD   Signed by:   Fuller Canada MD on 03/28/2009   Method used:   Print then Give to Patient   RxID:   [id number] ROBAXIN 500 MG TABS (METHOCARBAMOL) 1 by mouth q 6 as needed  #90 x 5   Entered and Authorized by:   Fuller Canada MD   Signed by:   Fuller Canada MD on 03/28/2009   Method used:   Print then Give to Patient   RxID:   [id number]

## 2010-04-08 NOTE — Progress Notes (Signed)
Summary: Pre-authorization for out-patient procedure  Phone Note Outgoing Call   Call placed to: Insurer Summary of Call: On 1/6j/11 - Contacted [employer] insurance re: out-patient surgery re: pre-authorization. Per online system, auth reference # [id number] Initial call taken by: Cammie Sickle,  March 15, 2009 12:45 PM

## 2010-04-08 NOTE — Progress Notes (Signed)
Summary: Pt requests postpone of appointment for a week  Phone Note Call from Patient   Caller: Patient Summary of Call: Patient called to request to postpone her 04/15/09 post op appt to 04/22/09, as she "has a chance to go to her sister's, out of town".  States doing well and continuing prescribed treatment. Called to advise If any problems.   Her sister's phone # for our records:  [phone] Initial call taken by: Cammie Sickle,  April 05, 2009 10:52 AM  Follow-up for Phone Call        ok Follow-up by: Ether Griffins,  April 08, 2009 8:38 AM

## 2010-04-08 NOTE — Assessment & Plan Note (Signed)
Summary: 2 wk reck left shoulder/post op surg 03/15/09/bcbs/bsf   Visit Type:  Follow-up  CC:  post op left shoulder.[name]  History of Present Illness: I saw Adrienne [name] in the office today for a followup visit.  She is a 55 years old woman with the complaint of:  post op left shoulder.  DEMEROL  ROBAXIN  DOS  03-15-09.  5 WEEKS POST OP   START HOME PROGRAM TODAY   FILED FOR DISABILITY DUE TO HEAD INJURY   LEFT shoulder continues to improve with decreased swelling improved motion  Recommend a home exercise program follow up 6 weeks continue Demerol for pain  Allergies: 1)  ! Pcn 2)  Codeine   Impression & Recommendations:  Problem # 1:  AFTERCARE FOLLOW SURGERY MUSCULOSKEL SYSTEM NEC (ICD-V58.78)  Orders: Post-Op Check (16109)  Problem # 2:  RUPTURE ROTATOR CUFF (ICD-727.61)  Orders: Post-Op Check (60454)  Patient Instructions: 1)  START HOME EXERCISES 2)  F/U IN 6 WEEKS   Prescriptions: DEMEROL 50 MG TABS (MEPERIDINE HCL) one by mouth q 6 hrs as needed pain  #90 x 0   Entered and Authorized by:   Fuller Canada MD   Signed by:   Fuller Canada MD on 04/22/2009   Method used:   Print then Give to Patient   RxID:   [id number]

## 2010-04-08 NOTE — Assessment & Plan Note (Signed)
Summary: POST OP 1/LT SHOULDER/SURG 03/15/09/[employer]/CAF   Visit Type:  postop visit  CC:  postop visit.  History of Present Illness: I saw Adrienne [name] in the office today for a followup visit.  She is a 55 years old woman with the complaint of:  POST OP LEFT SHOULDER.  DOS   03-15-09.  Procedure   Arthroscopy of left shoulder, open left rotator cuff repair with removal of distal clavicle.  Medication  she is on a Duragesic patch, Percocet, Robaxin.  Subjectives  Patient states that her shoulder hurts., she just can't get comfortable.  The incision looks good no redness no drainage.  The shoulder looks good with minimal swelling.  Normal function in the LEFT hand.  Doing reasonably well.  She does have a history of being narcotic tolerance so I'm not surprised about the pain  Recommend sling rest ice take medications as prescribed followup next week for staples out and discuss when to start physical therapy   Allergies: 1)  ! 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

## 2010-04-10 NOTE — Letter (Signed)
Summary: EGD ORDER  EGD ORDER   Imported By: Ave Filter 04/03/2010 12:10:35  _____________________________________________________________________  External Attachment:    Type:   Image     Comment:   External Document  Appended Document: EGD ORDER Pre-op:04/18/10@10 :00a.m.

## 2010-04-10 NOTE — Assessment & Plan Note (Signed)
Summary: CONSULT FOR EGD/SS   Visit Type:  Follow-up Visit Primary Care Provider:  Erle Crocker MD  CC:  needs egd.  History of Present Illness: [name] presents today for a visit prior to EGD for surveillance, hx of Barrett's. Last EGD was in December 2008, +Barrett's, +gastritis, colonoscopy done at same time showed friable anal canal, hyperplastic polyp. Next TCS due in  2018.  Denies epigastric pain, dysphagia or odynophagia. No N/V. Rare reflux, takes omeprazole daily which controls her symptoms. +BM daily, no melena or hematochezia.      Current Medications (verified): 1)  Lamictal 100 Mg Tabs (Lamotrigine) .... Take 1 Tablet By Mouth Once A Day 2)  Lunesta 3 Mg Tabs (Eszopiclone) .... Take 1 Tablet By Mouth Once A Day [facility] 3)  Pristiq 100 Mg Xr24h-Tab (Desvenlafaxine Succinate) .[name].. 1 By Mouth Once Daily 4)  Aspirin 81 Mg  Tbec (Aspirin) .[name].. 1 By Mouth Once Daily 5)  Multivitamins   Tabs (Multiple Vitamin) .[name].. 1 By Mouth Once Daily 6)  Omeprazole 20 Mg  Cpdr (Omeprazole) .[name].. 1 By Mouth Once Daily 7)  Melatonin 3 Mg Tabs (Melatonin) .[name].. 1 By Mouth At Bedtime 8)  Fish Oil  Oil (Fish Oil) .... Take 1 Tablet By Mouth Once A Day 9)  Flax  Oil (Flaxseed (Linseed)) .... Take 1 Tablet By Mouth Once A Day 10)  Lithium Carbonate 300 Mg Caps (Lithium Carbonate) .... Take 1 Tablet By Mouth Two Times A Day 11)  Cinnamon 500 Mg Tabs (Cinnamon) .... Take 1 Tablet By Mouth Once A Day 12)  Vitamin D3 400 Unit Tabs (Cholecalciferol) .... Take 1 Tablet By Mouth Once A Day 13)  Ritalin 10 Mg Tabs (Methylphenidate Hcl) 14)  Alprazolam 1 Mg Tabs (Alprazolam) .... One By Mouth Tid 15)  B-12 .... Once Daily 16)  Vit E .... Once Daily  Allergies: 1)  ! Pcn 2)  ! Sulfa 3)  Codeine  Past History:  Past Medical History: Allergic rhinitis Anemia/leukocytosis/thrombocytosis--Dr. Mariel Sleet Asthma Depression/ ? bipolar disorder--Dr. Mayford Knife GERD/Barrett's esophagus Low back  pain Seizure disorder--? absence Dry macular degeneration bilaterally--Dr. Lita Mains arthritis migraines, hx closed head brain injury OSA RLS NIDDM TCS/EGD 02/2007:, +Barrett's, +gastritis,  friable anal canal, hyperplastic polyp. Next TCS due in  2018.   Past Surgical History: Appendectomy Hysterectomy  rotator cuff repair bilaterally Tonsillectomy colonoscopy with biopsy--03/09/07 EGD with biopsy--03/09/07 R. THA 10/09  Family History: father-deceased-50-cancer, MI mother-79-heart disease, "intestinal cancer," OSA sister x1 child-26 child-28-GERD child-18 MGF, maternal aunt and uncle with DM.   No FH of Colon Cancer:  Social History: Married but separated Disability: secondary to head injury, lives with son now Current Smoker-1ppd x25 years Alcohol use-no Drug use-no  Review of Systems General:  Denies fever, chills, and anorexia. Eyes:  Denies blurring, irritation, and discharge. ENT:  Denies sore throat, hoarseness, and difficulty swallowing. CV:  Denies chest pains and syncope. Resp:  Denies dyspnea at rest and wheezing. GI:  Denies difficulty swallowing, pain on swallowing, nausea, abdominal pain, constipation, change in bowel habits, bloody BM's, and black BMs. GU:  Denies urinary burning and urinary frequency. MS:  Denies joint pain / LOM, joint swelling, and joint stiffness. Derm:  Denies rash, itching, and dry skin. Neuro:  Denies weakness and syncope. Psych:  Denies depression and anxiety. Endo:  Denies cold intolerance and heat intolerance.  Vital Signs:  Patient profile:   55 year old female Height:      65.5 inches Weight:  170 pounds BMI:     27.96 Temp:     98.5 degrees F oral Pulse rate:   64 / minute BP sitting:   118 / 84  (left arm) Cuff size:   regular  Vitals Entered By: Hendricks Limes LPN (April 03, 2010 11:13 AM)  Physical Exam  General:  Well developed, well nourished, no acute distress. Head:  Normocephalic and  atraumatic. Eyes:  PERRLA, no icterus. Mouth:  No deformity or lesions, dentition normal. Lungs:  Clear throughout to auscultation. Heart:  Regular rate and rhythm; no murmurs, rubs,  or bruits. Abdomen:  +BS, soft, non-tender, non-distended, no HSM. no rebound or guarding.  Msk:  Symmetrical with no gross deformities. Normal posture. Pulses:  Normal pulses noted. Extremities:  No clubbing, cyanosis, edema or deformities noted. Neurologic:  Alert and  oriented x4;  grossly normal neurologically. Psych:  Alert and cooperative. Normal mood and affect.  Impression & Recommendations:  Problem # 1:  BARRETT'S ESOPHAGUS (ICD-54.4)  55 year old Caucasian female due for surveillance EGD secondary to Barrett's. Last done in 2008. Denies any problems at this time. No N/V, abdominal pain, no dysphagia/odynophagia. On omeprazole daily, reflux controlled.   EGD to be performed by Dr. Jena Gauss in near future: the R/B/A have been discussed in detail with the pt; states understanding and wishes to proceed.  Orders: Est. Patient Level II (04540)

## 2010-04-18 ENCOUNTER — Other Ambulatory Visit: Payer: Self-pay | Admitting: Internal Medicine

## 2010-04-18 ENCOUNTER — Encounter (HOSPITAL_COMMUNITY): Payer: BC Managed Care – PPO | Attending: Internal Medicine

## 2010-04-18 DIAGNOSIS — Z01812 Encounter for preprocedural laboratory examination: Secondary | ICD-10-CM | POA: Insufficient documentation

## 2010-04-18 DIAGNOSIS — Z01810 Encounter for preprocedural cardiovascular examination: Secondary | ICD-10-CM | POA: Insufficient documentation

## 2010-04-18 LAB — BASIC METABOLIC PANEL
BUN: 7 mg/dL (ref 6–23)
CO2: 30 mEq/L (ref 19–32)
Calcium: 9.8 mg/dL (ref 8.4–10.5)
Chloride: 100 mEq/L (ref 96–112)
Creatinine, Ser: 0.57 mg/dL (ref 0.4–1.2)
GFR calc Af Amer: >60 mL/min (ref >60)
GFR calc non Af Amer: >60 mL/min (ref >60)
Glucose, Bld: 108 mg/dL — ABNORMAL HIGH (ref 70–99)
Potassium: 4.7 mEq/L (ref 3.5–5.1)

## 2010-04-18 LAB — HEMOGLOBIN AND HEMATOCRIT, BLOOD
HCT: 48.3 % — ABNORMAL HIGH (ref 36.0–46.0)
Hemoglobin: 16.4 g/dL — ABNORMAL HIGH (ref 12.0–15.0)

## 2010-04-24 ENCOUNTER — Encounter: Payer: Self-pay | Admitting: Internal Medicine

## 2010-04-24 ENCOUNTER — Ambulatory Visit (HOSPITAL_COMMUNITY)
Admission: RE | Admit: 2010-04-24 | Payer: BC Managed Care – PPO | Source: Ambulatory Visit | Admitting: Internal Medicine

## 2010-05-07 ENCOUNTER — Other Ambulatory Visit (HOSPITAL_COMMUNITY): Payer: BC Managed Care – PPO

## 2010-05-07 ENCOUNTER — Encounter (HOSPITAL_COMMUNITY): Payer: BC Managed Care – PPO

## 2010-05-09 ENCOUNTER — Ambulatory Visit (HOSPITAL_COMMUNITY): Payer: BC Managed Care – PPO | Admitting: Oncology

## 2010-05-25 LAB — BASIC METABOLIC PANEL
CO2: 28 mEq/L (ref 19–32)
Calcium: 9.2 mg/dL (ref 8.4–10.5)
Chloride: 101 mEq/L (ref 96–112)
Creatinine, Ser: 0.57 mg/dL (ref 0.4–1.2)
GFR calc Af Amer: >60 mL/min (ref >60)
GFR calc non Af Amer: >60 mL/min (ref >60)
Glucose, Bld: 136 mg/dL — ABNORMAL HIGH (ref 70–99)
Potassium: 3.9 mEq/L (ref 3.5–5.1)
Sodium: 138 mEq/L (ref 135–145)

## 2010-05-25 LAB — HEMOGLOBIN AND HEMATOCRIT, BLOOD
HCT: 46.5 % — ABNORMAL HIGH (ref 36.0–46.0)
Hemoglobin: 15.9 g/dL — ABNORMAL HIGH (ref 12.0–15.0)

## 2010-05-27 LAB — RAPID URINE DRUG SCREEN, HOSP PERFORMED
Barbiturates: NOT DETECTED
Cocaine: NOT DETECTED
Opiates: NOT DETECTED

## 2010-05-27 LAB — DIFFERENTIAL
Basophils Absolute: 0.1 10*3/uL (ref 0.0–0.1)
Basophils Relative: 1 % (ref 0–1)
Eosinophils Absolute: 0 10*3/uL (ref 0.0–0.7)
Neutro Abs: 17.4 10*3/uL — ABNORMAL HIGH (ref 1.7–7.7)
Neutrophils Relative %: 78 % — ABNORMAL HIGH (ref 43–77)

## 2010-05-27 LAB — BASIC METABOLIC PANEL
BUN: 12 mg/dL (ref 6–23)
Calcium: 9.8 mg/dL (ref 8.4–10.5)
Chloride: 102 mEq/L (ref 96–112)
Creatinine, Ser: 0.75 mg/dL (ref 0.4–1.2)
GFR calc Af Amer: >60 mL/min (ref >60)
GFR calc non Af Amer: >60 mL/min (ref >60)
Glucose, Bld: 155 mg/dL — ABNORMAL HIGH (ref 70–99)
Sodium: 137 mEq/L (ref 135–145)

## 2010-05-27 LAB — CBC
HCT: 48.7 % — ABNORMAL HIGH (ref 36.0–46.0)
Hemoglobin: 17.1 g/dL — ABNORMAL HIGH (ref 12.0–15.0)
MCHC: 35.1 g/dL (ref 30.0–36.0)
Platelets: 285 10*3/uL (ref 150–400)
RDW: 13.4 % (ref 11.5–15.5)

## 2010-06-01 LAB — DIFFERENTIAL
Eosinophils Absolute: 0.1 10*3/uL (ref 0.0–0.7)
Lymphs Abs: 2.9 10*3/uL (ref 0.7–4.0)
Monocytes Absolute: 0.6 10*3/uL (ref 0.1–1.0)
Monocytes Relative: 7 % (ref 3–12)
Neutrophils Relative %: 60 % (ref 43–77)

## 2010-06-01 LAB — CBC
HCT: 48.3 % — ABNORMAL HIGH (ref 36.0–46.0)
Hemoglobin: 16.5 g/dL — ABNORMAL HIGH (ref 12.0–15.0)
MCV: 95.4 fL (ref 78.0–100.0)
RBC: 5.06 MIL/uL (ref 3.87–5.11)
WBC: 9.1 10*3/uL (ref 4.0–10.5)

## 2010-06-23 LAB — DIFFERENTIAL
Basophils Relative: 1 % (ref 0–1)
Eosinophils Absolute: 0.2 10*3/uL (ref 0.0–0.7)
Eosinophils Relative: 1 % (ref 0–5)
Lymphs Abs: 2.8 10*3/uL (ref 0.7–4.0)
Monocytes Relative: 5 % (ref 3–12)

## 2010-06-23 LAB — CBC
HCT: 49.8 % — ABNORMAL HIGH (ref 36.0–46.0)
MCHC: 32.9 g/dL (ref 30.0–36.0)
MCV: 90.6 fL (ref 78.0–100.0)
Platelets: 300 10*3/uL (ref 150–400)
WBC: 11.7 10*3/uL — ABNORMAL HIGH (ref 4.0–10.5)

## 2010-07-22 NOTE — H&P (Signed)
NAME:  Adrienne [name], Adrienne [name]                ACCOUNT NO.:  [id number]   MEDICAL RECORD NO.:  [id number]          PATIENT TYPE:  AMB   LOCATION:  DAY                           FACILITY:  [facility]   PHYSICIAN:  R. Roetta Sessions, M.D. DATE OF BIRTH:  1955/07/10   DATE OF ADMISSION:  DATE OF DISCHARGE:                              HISTORY & PHYSICAL   This is a lady with a history of known Barrett's esophagus.  Last EGD  2004.  She has had some intermittent rectal bleeding.  We saw her in the  office on April 20, 2006, and scheduled her for both EGD and  colonoscopy, but she decided not to have it done.  She has continued  intermittent rectal bleeding, no constipation or diarrhea.  Has not had  any abdominal pain.  Reflux symptoms well controlled on Prilosec 20 mg  orally a day.  She did not have any dysphagia.  She has gained 24 pounds  since we saw her in February.  Mother made this appointment and insisted  she follow through and get the recommended evaluations.  She did trip  and fall at home, has had some right lower extremity pain and some left  rib pain three weeks but has not seen her primary care physician.   CURRENT MEDICATIONS:  1. Zoloft 200 mg at bedtime.  2. Clonazepam 0.5 mg q.i.d.  3. Aspirin 81 mg daily.  4. Multivitamin daily.  5. Prilosec 20 mg orally daily.  6. Depakote daily.  7. Lamisil 2.5 mg daily.  8. Meloxicam p.r.n.  9. Seroquel.  10.Provigil.   ALLERGIES:  1. CODEINE  2. PENICILLIN.   PAST MEDICAL HISTORY:  1. Depression.  2. Gastroesophageal reflux disease with Barrett's esophagus.  3. History of small hiatal hernia.  4. Restless leg syndrome.  5. Chronic migraines.   PAST SURGICAL HISTORY:  1. Appendectomy.  2. Tonsillectomy.  3. Hysterectomy.   FAMILY HISTORY:  Negative for chronic GI or liver illness.   SOCIAL HISTORY:  Patient is married, three children.  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  She has a 30-pack-year tobacco use.  Rarely consumes alcoholic beverages.  Denies any illicit drug use.   REVIEW OF SYSTEMS:  No chest pain, dyspnea on exertion, weight gain as  outlined above.  No fever or chills.   PHYSICAL EXAMINATION:  VITAL SIGNS:  Weight 196, height 5 feet 5,  temperature 97.8, blood pressure 140/70, pulse 72.  GENERAL APPEARANCE:  A 55 year old lady resting comfortably.  SKIN:  Warm and dry.  No jaundice.  HEENT:  No scleral icterus.  Conjunctivae are pink.  CHEST:  Lungs are clear to auscultation.  CARDIOVASCULAR:  Regular rate and rhythm without murmurs, rubs, or  gallops.  ABDOMEN:  Nondistended, positive bowel sounds, soft and nontender  without appreciable mass or organomegaly.  RECTAL:  Exam deferred to time of colonoscopy.   IMPRESSION:  Adrienne [name] is a pleasant 55 year old lady who has  known Barrett's esophagus and is somewhat  overdue for surveillance.  She  failed to follow through on coming for her EGD early this year.  In  addition, she has intermittent hematochezia.  Colonoscopy has also been  recommended which she has put off.  We talked about getting back on  track and performing an EGD and colonoscopy in the near future.  Questions were answered.  She is agreeable.  Plan to perform EGD and  colonoscopy in the very near future.   She does have some left rib pain and some right lower extremity pain  which I deferred to evaluate and I recommend that she really ought to go  ahead and see Dr. Regino Schultze in the very near future and get those issues  sorted out prior to EGD and colonoscopy which, at her request, will be  scheduled just after Christmas.      Jonathon Bellows, M.D.  Electronically Signed     RMR/MEDQ  D:  02/15/2007  T:  02/16/2007  Job:  [id number]   cc:   Kirk Ruths, M.D.  Fax: [phone]

## 2010-07-22 NOTE — H&P (Signed)
NAMEAZALYA, GALYON NO.:  [id number]   MEDICAL RECORD NO.:  [id number]          PATIENT TYPE:  INP   LOCATION:  NA                           FACILITY:  [facility]   PHYSICIAN:  Ollen Gross, M.D.    DATE OF BIRTH:  [birth year]   DATE OF ADMISSION:  12/21/2007  DATE OF DISCHARGE:                              HISTORY & PHYSICAL   CHIEF COMPLAINT:  Right hip pain.   HISTORY OF PRESENT ILLNESS:  Patient is a 55 year old female who has  been seen by Dr. Lequita Halt for ongoing right hip pain that has been  ongoing since earlier this year.  She had a slip and a fall.  She had  immediate onset of significant pain, marked increased pain and  dysfunction since February.  She has been seen in the office where pain  is in the groin radiating to her anterior thigh.  X-rays show rapidly  progressive arthritis that has been brought on by the fall.  It is felt  that due to the rapid progression she would benefit from undergoing  surgical intervention.  Risks and benefits discussed.  Patient  __________ hospital.  She has been seen by Dr. Jen Mow preoperatively and  felt to be stable.  She did say she had a low threshold to start  albuterol nebulizers and steroids postoperatively for any wheezing due  to her history of asthma.   ALLERGIES:  1. Penicillin causes rash.  2. Codeine causes headaches.  3. Sulfa causes rash.   CURRENT MEDICATIONS:  1. Amitriptyline.  2. Diazepam.  3. Meperidine.  4. Omeprazole.  5. Perphenazine.  6. Lunesta.  7. Lithium.  8. Pristiq .  9. Lamotrigine.  10.Melatonin.  11.Baby aspirin.  12.Multivitamin.  13.Aleve.   PAST MEDICAL HISTORY:  1. Prior closed head brain injury.  2. Depression.  3. Anxiety.  4. Migraines.  5. Dry macular degeneration.  6. History of asthma.  7. History of bronchitis.  8. Obstructive sleep apnea.  9. Allergic rhinitis.  10.Hiatal hernia.  11.Gastroesophageal reflux disease.  12.Barrett esophagus with  hemorrhoids.  13.History of colon polyps.  14.Mild borderline diabetes.  15.Anemia.  16.Degenerative disk disease.   PAST SURGICAL HISTORY:  1. Tonsillectomy.  2. Appendectomy.  3. Partial hysterectomy.  4. Right rotator cuff repair.  5. Colonoscopy with polypectomies.  6. EGD with biopsies.   FAMILY HISTORY:  Father deceased with history of cancer and heart  attack.  Mother, history of heart issues and intestinal cancer.  She has  1 sister.   SOCIAL HISTORY:  Currently separated.  One pack per day for about 35  years smoker.  No alcohol.  Three children.  Lives with her son.  She  does have a caregiver lined up after surgery.   REVIEW OF SYSTEMS:  GENERAL:  No fevers, chills, night sweats.  NEURO:  She does have occasional headaches and some insomnia.  No seizures,  syncope, paralysis.  RESPIRATORY:  No shortness breath, productive  cough, or hemoptysis.  She does have a history of asthma, bronchitis,  and obstructive sleep apnea.  CARDIOVASCULAR:  No  chest pain, angina, or  orthopnea.  GI:  No nausea, vomiting, diarrhea, or constipation.  GU:  No dysuria, hematuria, or discharge.  MUSCULOSKELETAL:  Hip pain.   PHYSICAL EXAM:  VITAL SIGNS:  Pulse 92.  Respirations 14.  Blood  pressure 102/64.  GENERAL:  A 54 year old white female, well nourished, well developed,  slightly overweight, tall frame, alert, oriented, and cooperative.  HEENT:  Normocephalic, atraumatic.  Pupils round and reactive.  Oropharynx clear.  EOMs intact.  NECK:  Supple.  CHEST:  Clear.  HEART:  Regular rate and rhythm.  No murmur.  ABDOMEN:  Soft, round, slightly protuberant.  Bowel sounds present.  RECTAL:  Not done, not part of present illness.  BREAST:  Not done, not part of present illness.  GENITALIA:  Not done, not part of present illness.  EXTREMITIES:  Right hip, flexion 90, internal rotation 10, external  rotation 20, abduction 20 to 30.   IMPRESSION:  Osteoarthritis, right hip.   PLAN:   Patient admitted to [facility] to undergo a right total  hip arthroplasty.  Surgery will be performed by Dr. Ollen Gross.      [name], P.A.C.      Ollen Gross, M.D.  Electronically Signed    ALP/MEDQ  D:  12/21/2007  T:  12/21/2007  Job:  [id number]   cc:   Ollen Gross, M.D.  Fax: [phone]   Erle Crocker, [phone], MD  [facility]  [phone] [place]  [place], [place] [address]

## 2010-07-22 NOTE — Op Note (Signed)
NAMEEULONDA, ANDALON NO.:  [id number]   MEDICAL RECORD NO.:  [id number]          PATIENT TYPE:  INP   LOCATION:  0012                         FACILITY:  [facility]   PHYSICIAN:  Ollen Gross, M.D.    DATE OF BIRTH:  [birth year]   DATE OF PROCEDURE:  12/21/2007  DATE OF DISCHARGE:                               OPERATIVE REPORT   PREOPERATIVE DIAGNOSIS:  Osteoarthritis, right hip.   POSTOPERATIVE DIAGNOSIS:  Osteoarthritis, right hip.   PROCEDURE:  Right total hip arthroplasty.   ASSISTANT:  Avel Peace, PA-C   ANESTHESIA:  General.   ESTIMATED BLOOD LOSS:  700.   DRAIN:  Hemovac x1.   COMPLICATIONS:  None.   CONDITION:  Stable to recovery room.   BRIEF CLINICAL NOTE:  Xin is a 55 year old female who has a rapidly  progressive arthritis of the right hip with intractable severe pain and  dysfunction.  She has x-ray documentation of a very rapid progression to  arthritic changes in her joint.  She presents now for right total hip  arthroplasty secondary to intractable pain.   PROCEDURE IN DETAIL:  After successful administration of general  anesthetic, the patient was placed in left lateral decubitus position  with the right side up and held with the hip positioner.  The right  lower extremity was isolated from her perineum with plastic drapes and  prepped and draped in the usual sterile fashion.   A short posterolateral incision was made with a 10 blade through the  subcutaneous tissue to the level of fascia lata which was incised in  line with the skin incision.  The sciatic nerve was palpated and  protected and the short external rotators isolated off the femur.  A  capsulectomy was performed and the hip was dislocated.  The center of  femoral head was marked and a trial prosthesis was placed such that the  center of her trial head corresponded to the center of the native  femoral head.  An osteotomy line was marked on the femoral neck and  osteotomy  was made with an oscillating saw.  The femoral head was  removed and the femur retracted anteriorly to gain acetabular exposure.   The acetabular retractors were placed and the labrum and osteophytes  removed.  The acetabular reaming started at 43 mm coursing increments of  2-51 mm and then a 52-mm Pinnacle acetabular shell was placed in  anatomic position and transfixed with two dome screws with excellent  purchase.  The apex hole eliminator was placed in the permanent 36 mm  neutral Ultamet metal liner placed for a metal-on-metal hip replacement.   The femur was prepared with the canal finder and irrigation.  Axial  reaming was performed to 13.5 mm proximal reaming to an 18 D and the  sleeve machined to a large.  An 22 D large trial sleeve was placed with  an 18 x 13 stem and a 36 plus 8 neck matching native anteversion.  A 36  plus 0 head was placed.  The 36 plus 0 was a little tight, so we went  to  a 36 minus 3 head which had perfect soft tissue tension.  She had great  stability with full extension with external rotation, 70 degrees  flexion, 40 degrees adduction, 90 degrees internal rotation, 90 degrees  of flexion and 70 degrees of internal rotation.  By placing the right  leg on top of the left, it felt as though the limb lengths were equal.  The hip was then dislocated and the trials were removed.  The permanent  18 D large sleeve was placed with an 18 x 13 stem and 36 plus 8 neck  matching native anteversion.  The 36 minus 3 head was placed and the hip  was reduced with the same stability parameters.   The wounds were copiously irrigated with saline solution and short  rotators reattached to the femur through drill holes.  The fascia lata  was closed over a Hemovac drain with interrupted #1 Vicryl.  The subcu  was closed with #1 and #2-0 Vicryl and subcuticular running 4-0  Monocryl.  The incision was cleaned and dried and Steri-Strips and a  bulky sterile dressing  applied.   She was then placed into a knee immobilizer, awakened and transported to  recovery in stable condition.      Ollen Gross, M.D.  Electronically Signed     FA/MEDQ  D:  12/21/2007  T:  12/22/2007  Job:  [id number]

## 2010-07-22 NOTE — Procedures (Signed)
Adrienne [name], FADER NO.:  [id number]   MEDICAL RECORD NO.:  [id number]          PATIENT TYPE:  OUT   LOCATION:  SLEEP LAB                     FACILITY:  [facility]   PHYSICIAN:  Barbaraann Share, MD,FCCPDATE OF BIRTH:  1955/07/05   DATE OF STUDY:  04/15/2007                            NOCTURNAL POLYSOMNOGRAM   REFERRING PHYSICIAN:  Erle Crocker, M.D.   REFERRING PHYSICIAN:  Erle Crocker, M.D.   INDICATION FOR STUDY:  780.50.   EPWORTH SLEEPINESS SCORE:  17.   SLEEP ARCHITECTURE:  The patient had a total sleep time of 416 minutes,  with normal quantity of REM; but decreased slow-wave sleep.  Sleep onset  latency was normal at 9 minutes, and REM onset was fairly rapid at 43  minutes.  Sleep efficiency was excellent at 96%.   RESPIRATORY DATA:  The patient was found to have 18 obstructive apneas  and 9 hypopneas, for an apnea-hypopnea index of 4 events per hour.  However, she was also noted to have 56 REARAs, giving her a respiratory  disturbance index of 12 events per hours.  The events were not  positional, but clearly were more prominent during REM.  Moderate  snoring was noted throughout.  The patient did not need split night  protocol, secondary to the small numbers of events.   OXYGEN DATA:  There was O2 desaturation as low as 77% with her  obstructive events.  In total, the patient spent 131 minutes during the  night with an O2 saturation less than 88%.  Many of these desaturations  were not related to obstructive events.   CARDIAC DATA:  The patient was found to have frequent episodes of  bigeminy, quadrigeminy, and isolated PVCs.   MOVEMENT-PARASOMNIA:  The patient was found to have a 197 leg jerks,  with an index of 28 per hours.  Seven of these resulted in arousals.   IMPRESSIONS-RECOMMENDATIONS:  1. Small numbers of obstructive events, which do not meet the      apnea/hypopnea index criteria for the obstructive sleep apnea      syndrome;  however, the patient had large numbers of respiratory      effort-related arousals, for a respiratory disturbance index of 12      events per hour.  She was also noted to have frequent episodes of      airflow reduction which did result in desaturation; however, they      did not meet the strict definition of an obstructive hypopnea.  The      patient clearly has sleep disordered breathing, and clinical      correlation is suggested to see if she would benefit from      aggressive treatment.  2. Significant oxygen desaturation as low as 77% with obstructive      events, but the patient also had spontaneous oxygen desaturations      throughout the night.  In total, she spent 131 minutes less than      88%.  If the patient's sleep apnea is not treated with a positive      pressure device, I would highly recommend supplemental  oxygen at      bedtime.  3. Large numbers of periodic leg movements, with some degree of      arousal from sleep.  It is unclear whether this is related to her      sleep disordered breathing, or whether she may have a primary      movement disorder of sleep.  Clinical correlation is suggested.  4. Frequent bigeminy and quadrigeminy, as well as isolated PVCs noted.      Clinical correlation is suggested.      Barbaraann Share, MD,FCCP  Diplomate, American Board of Sleep  Medicine  Electronically Signed     [facility]/MEDQ  D:  04/27/2007 07:32:54  T:  04/27/2007 15:39:19  Job:  [id number]

## 2010-07-22 NOTE — Op Note (Signed)
NAME:  Adrienne [name], Adrienne [name]                ACCOUNT NO.:  [id number]   MEDICAL RECORD NO.:  [id number]          PATIENT TYPE:  AMB   LOCATION:  DAY                           FACILITY:  [facility]   PHYSICIAN:  R. Roetta Sessions, M.D. DATE OF BIRTH:  1955/09/09   DATE OF PROCEDURE:  03/09/2007  DATE OF DISCHARGE:                               OPERATIVE REPORT   PROCEDURES:  EGD with biopsy, colonoscopy with biopsy.   INDICATIONS FOR PROCEDURE:  A 56 year old lady with known Barrett  esophagus overdue for surveillance.  She is not having any  dysphagia or  odynophagia.  She notes intermittent hematochezia.  She has put off EGD  and colonoscopy until now.  This approach has been discussed with the  patient at length.  Potential risks, benefits alternatives have been  reviewed, questions answered, and she is agreeable.  Please see the  documentation in the medical record.   PROCEDURE NOTE:  O2 saturation, blood pressure, pulse, respirations  monitored throughout the entire procedure.  Conscious sedation:  Versed  8 mg IV, Demerol 175 mg IV, Phenergan 12.5 mg diluted slow IV push to  augment conscious sedation.  Instrumentation:  Pentax video chip system.  Cetacaine spray for topical pharyngeal anesthesia.   EGD findings:  Examination of the tubular esophagus revealed 3 inlet  patches of salmon-colored epithelium beginning in the UES and going into  the proximal esophagus.  Please see photos.  In the distal esophagus  there was salmon-colored epithelium coming up circumferentially and in  tongues a good 6-7 cm above the EG junction.  Please see photos.  There  is no evidence of esophagitis or neoplasia.  There was no stricture.  EG  junction was easily traversed.   Stomach:  The gastric cavity was empty, it insufflated well with air.  Thorough examination of the gastric mucosa including retroflex view of  the proximal stomach, esophagogastric junction demonstrated small hiatal  hernia.  There were  diffuse erosions of the gastric mucosa.  The pylorus  was patent.  These erosions extended down well into the bulb.  There was  no out-and-out ulcer or infiltrating process in the stomach or duodenum.  Otherwise D1 and D2 appeared normal, as did the stomach  therapeutic/diagnostic maneuvers.  The distal Barrett epithelium was  rebiopsied for histologic study.  The proximal inlet patch was also  biopsied and subsequently biopsies of the stomach were taken for  histologic study.  The patient tolerated the procedure well and was  prepared for colonoscopy.   Digital rectal exam revealed no abnormalities, unfortunately the prep  was suboptimal to poor.  Colonic mucosa was surveyed from the  rectosigmoid junction through the left transverse and right colon to the  distal orifice, ileocecal valve and cecum.  These structures were well  seen and photographed for the record.  From this level the scope was  cautiously withdrawn and all previously mentioned mucosal surfaces were  again seen.  There was quite a bit of granular liquid viscous stool  throughout the colon, making the exam more difficult.  We had copiously  irrigated with  a good 1500 mL to get better visualization of the colonic  mucosa.  A small polyp or other lesion may not have been seen because of  the poor prep today.  The colonic mucosa appeared grossly normal.  The  scope was pulled down into the rectum where examination of the rectal  mucosa including retroflex view of the anal verge demonstrated two 5 mm  polyps at 5 cm and a friable anal canal.  Otherwise rectal mucosa  appeared normal.  The 2 polyps were cold biopsy removed.  The patient  tolerated both procedures well and was reactive in endoscopy.   IMPRESSION:  Esophagogastroduodenoscopy:  Multiple proximal inlet  patches biopsied, salmon-colored epithelium distal esophagus consistent  with Barrett esophagus, otherwise normal tubular esophagus, status post  biopsy.  Small  hiatal hernia.  Multiple antral erosions and patchy  erythema consistent with gastritis, status post biopsies.  Patent  pylorus.  Bulbar erosions.  Otherwise D1 and D2 appeared normal.   Colonoscopy findings:  Friable anal canal, diminutive rectal polyps,  status post cold biopsy removal.  Otherwise, normal rectum.  Grossly  normal colon but the poor prep compromised exam.   RECOMMENDATIONS:  1. Follow up on path.  2. Continue Prilosec 20 mg orally daily.  3. Anusol-HC suppositories 1 per rectum at bedtime x10 days.  4. Follow up on path.  5. Further recommendations to follow.      Jonathon Bellows, M.D.  Electronically Signed     RMR/MEDQ  D:  03/09/2007  T:  03/09/2007  Job:  [id number]   cc:   Kirk Ruths, M.D.  Fax: [phone]

## 2010-07-25 NOTE — Discharge Summary (Signed)
NAMEGENEIVE, SANDSTROM NO.:  [id number]   MEDICAL RECORD NO.:  [id number]          PATIENT TYPE:  INP   LOCATION:  1613                         FACILITY:  [facility]   PHYSICIAN:  Ollen Gross, M.D.    DATE OF BIRTH:  03-08-1956   DATE OF ADMISSION:  12/21/2007  DATE OF DISCHARGE:  12/25/2007                               DISCHARGE SUMMARY   ADMITTING DIAGNOSES:  1. Osteoarthritis right hip.  2. Prior closed head and brain injury.  3. Depression.  4. Anxiety.  5. Migraines.  6. Dry macular degeneration.  7. History of asthma.  8. History of bronchitis.  9. Obstructive sleep apnea.  10.Allergic rhinitis.  11.Hiatal hernia.  12.Gastroesophageal reflux disease.  13.Barrett's esophagus.  14.Hemorrhoids.  15.History of colon polyps.  16.Mild borderline diabetes.  17.Anemia.  18.Degenerative disk disease.   DISCHARGE DIAGNOSES:  1. Osteoarthritis right hip status post right total hip replacement      arthroplasty remaining.  2. Prior closed head and brain injury.  3. Depression.  4. Anxiety.  5. Migraines.  6. Dry macular degeneration.  7. History of asthma.  8. History of bronchitis.  9. Obstructive sleep apnea.  10.Allergic rhinitis.  11.Hiatal hernia.  12.Gastroesophageal reflux disease.  13.Barrett's esophagus.  14.Hemorrhoids.  15.History of colon polyps.  16.Mild borderline diabetes.  17.Anemia.  18.Degenerative disk disease.   PROCEDURE:  December 21, 2007 right total hip.  Surgeon Dr. Lequita Halt,  assistant Avel Peace PA-C.  Anesthesia general.   CONSULTS:  None.   BRIEF HISTORY:  Adrienne [name] is a 55 year old female with a rapidly progressive  osteoarthritis of the right hip with intractable severe pain and  dysfunction.  X-ray documentation of very rapid progression arthritic  changes in her joint now presents for total hip arthroplasty.   LABORATORY DATA:  Preop CBC showed hemoglobin 15.8, hematocrit of 47.1,  white cell count 9.7, platelets  294.  Postop hemoglobin 12.50.  Only  drifted a little bit down to last H&H of 12.2 and 36.0.  White count  went up to 9.7-12.8 back down to 11.2.  Preop 12.6 and 31, respectively  with an INR 0.9.  Serial protimes followed.  PT/INR 24.8 and 2.1.  Chem  panel on admission elevated glucose of 317, low albumin of 3.2.  Remaining Chem panel within normal limits.  Serial B mets were followed.  Electrolytes all remained within normal limits.  Glucose improved down  to 148, CO2 went up from 23-33.  Preop UA positive glucose, 0-2 red  cells.  Otherwise, negative.  Blood group type O+.  EKG dated November 19, 2007, normal sinus rhythm, normal EKG, confirmed unable to read  signature.   HOSPITAL COURSE:  The patient was admitted to [facility],  tolerated procedure well, later transferred to the recovery room on the  orthopedic floor.  Postoperative care given, PCA and p.o. analgesic pain  control following surgery.  Some pain through the night, but doing  pretty well on the morning of day #1.  Started back on her CPAP at  night.  Started back on her omeprazole, newly  diagnosed borderline  diabetes, managing right now with diet and exercise.  Will follow-up  with her primary care Paydon Carll for further postoperative care of her  diabetes.  Will be placed on sliding scale insulin just for tighter  control in the postoperative period.  Did pretty well on getting up out  of bed on day #1.  By day #2, she was walking about 20 feet or so.  Pain  was under better control.  Dressing changed on day #2, incision looked  good.  Labs looked stable.  Continue to slowly progress with physical  therapy walking about 30 feet by day #3, but still needed a little bit  more therapy.  Wound was healing well, no complaints.  She was weaned  over to p.o. medications, and by postop day #4, she was doing better  from a therapy standpoint.  Weaned over to her p.o. medications and was  discharged home.    DISCHARGE/PLAN:  1. Discharged home on December 25, 2007.  2. Discharge diagnoses, please see above.  3. Discharge medications:  Percocet, Robaxin, Coumadin.  Follow-up 2      weeks.   DISCHARGE INSTRUCTIONS:  1. Activity:  Partial weightbearing 25-50% right leg, hip precautions      total hip protocol.  May start showering, do not submerge incision      under water.  2. Diet:  As tolerated.  Follow-up in 2 weeks.   DISPOSITION:  Home.   CONDITION ON DISCHARGE:  Improved.      [name], P.A.C.      Ollen Gross, M.D.  Electronically Signed    ALP/MEDQ  D:  02/09/2008  T:  02/09/2008  Job:  [id number]   cc:   Ollen Gross, M.D.  Fax: [phone]   Erle Crocker, M.D.

## 2010-07-25 NOTE — Op Note (Signed)
NAME:  Adrienne [name], Adrienne [name]                          ACCOUNT NO.:  [id number]   MEDICAL RECORD NO.:  [id number]                   PATIENT TYPE:  AMB   LOCATION:  DAY                                  FACILITY:  [facility]   PHYSICIAN:  Vickki Hearing, M.D.           DATE OF BIRTH:  1955/06/29   DATE OF PROCEDURE:  DATE OF DISCHARGE:                                 OPERATIVE REPORT   CHIEF COMPLAINT:  Chief complaint was right shoulder pain.   HISTORY OF PRESENT ILLNESS:  She is a 55 year old female who had aching,  throbbing right shoulder pain without any trauma that apparently began while  she was moving furniture. Because of her history of ulcers, she cannot take  antiinflammatories.  She had nocturnal pain, did not respond to therapeutic  exercise or cortisone injection. An MRI was obtained that showed AC joint  arthrosis, partial tear of the rotator cuff versus full-thickness tear of  the infraspinatus tendon. She presented for surgical treatment because of  persistent pain.   PREOPERATIVE DIAGNOSES:  Rotator cuff tear, impingement syndrome and  acromioclavicular joint arthritis.   POSTOPERATIVE DIAGNOSES:  Partial thickness tear supraspinatus tendon, full-  thickness tear infraspinatus tendon, osteoarthritis acromioclavicular joint.   PROCEDURE:  Arthroscopic subacromial decompression, open rotator cuff  repair, open Mumford procedure.   FINDINGS:  There was biceps degeneration in the joint. There was a near full-  thickness infraspinatus tear, partial thickness supraspinatus tear, there  was bursitis and AC joint arthritis.   SURGEON:  Vickki Hearing, M.D.   ANESTHESIA:  General.   DESCRIPTION OF PROCEDURE:  The patient was identified as Adrienne [name] by  armband and verbal confirmation. She had marked her right shoulder as the  operative site.  I signed my initials and wrote the procedure over that area  on the right shoulder. She was given Ancef, taken to the operating  room for  a general anesthetic. She was placed in the semisitting position at which  time a time out was taken to confirm the patient's name, operative site and  procedure. The consent was checked, everyone agreed on the planned procedure  and we proceeded as follows.   The arthroscopic portion of the procedure was done in the standard fashion  via posterior portal approach. Diagnostic arthroscopy revealed biceps  degeneration and undersurface fraying of the rotator cuff. This was treated  with debridement via anterior portal.   We then placed the scope in the subacromial space and did a subacromial  decompression and resected the inferior portion of the acromion.  The [facility]  joint was identified at this time at which time there was a large amount of  subacromial bursal proliferation and inflammation and the inferior portion  of the clavicle was found to be degenerative with inferior impinging spur in  the supraspinatus tendon. We also noted at this time that the rotator cuff  was torn near the avascular  junction. This appeared to be partial thickness  tearing. We marked this area with a Prolene suture and removed the scope  from the subacromial space.   We then opened the procedure via an anterior approach, divided the subcu  tissue down to the deltoid fascia, split the deltoid in line with it fibers  across the acromion and across the [facility] joint into the trapezius fascia where  we did a subperiosteal dissection. We resected the [facility] joint.  The distal  clavicle portion was resected using a saw then the posterior corner was  beveled posteriorly.  Bone wax was applied over the end of the bone. This  gave an excellent view of the rotator cuff and we followed the marking  suture down to the rotator cuff ear at which time we noted a partial  thickness tear but greater than 50% of the supraspinatus tendon and a near  full-thickness tear 95% of the infraspinatus tendon. We converted this after   bursectomy to a full the thickness tear and then did a side to side repair  with nonabsorbable sutures using four stitches. This gave an excellent  repair and there was no tension with the arm at this side. We irrigated,  closed with #2 Ethibond suture, 2-0 Vicryl and 3-0 Biosyn. We used Steri-  Strips and 30 mL of Marcaine and covered the wound with sterile dressings.  We placed her in a shoulder immobilizer, extubated her and took her to the  recovery room in stable condition.   We will assess her pain levels and decide whether she needs an observation  sty or whether she can go home.      ___________________________________________                                            Vickki Hearing, M.D.   SEH/MEDQ  D:  04/11/2003  T:  04/11/2003  Job:  [id number]

## 2010-07-25 NOTE — Op Note (Signed)
NAME:  Adrienne [name], Adrienne [name]                          ACCOUNT NO.:  [id number]   MEDICAL RECORD NO.:  [id number]                   PATIENT TYPE:  AMB   LOCATION:  DAY                                  FACILITY:  [facility]   PHYSICIAN:  R. Roetta Sessions, M.D.              DATE OF BIRTH:  06-14-1955   DATE OF PROCEDURE:  07/17/2002  DATE OF DISCHARGE:                                 OPERATIVE REPORT   PROCEDURE:  Screening colonoscopy.   ENDOSCOPIST:  Gerrit Friends. Rourk, M.D.   INDICATIONS FOR PROCEDURE:  The patient is a 55 year old lady, smoker, who  was in good health until 5 days ago when she got up early in the morning,  experienced urgency to have a bowel movement, and had diarrhea accompanied  by gross blood per recent.  She had several episodes; however, by the time  24 hours had elapsed her symptoms had subsided. She really did not have as  much in the way of abdominal cramps. She describes accompanying urgency.  She has had no melena.  Prior to this 24 hour episode she tells me that she  has 1 formed bowel movement daily, although 2 years earlier she had a  similar, very self-limiting episode lasting no more than several hours, for  which she saw Dr. Sherwood Gambler.  She was told that she had a parasite (without any  studies being performed) and was given medication.  Aside from these 2  episodes she denies chronic hematochezia, melena, or diarrhea.  Generally  she has 1-2 formed bowel movements daily. She does take Voltaren on a  regular basis.  She also smokes.  Colonoscopy is now being done to further  evaluate her symptoms.  This approach has been discussed with the patient at  length.  The potential risks, benefits, and alternatives have been reviewed;  questions answered.  Please see my handwritten H&P for more information.   MONITORING:  O2 saturation, blood pressure, pulse and respirations were.  Monitored throughout the entire procedure.  Conscious sedation: Versed 3 mg IV, Demerol 75 mg  IV.   INSTRUMENT:  Olympus video chip pediatric colonoscope.   FINDINGS:  Digital rectal exam revealed no abnormalities.   ENDOSCOPIC FINDINGS:  The prep was good.   RECTUM:  Examination of the rectal mucosa including the retroflex view of  the anal verge and en faux view of the anal canal revealed minimal anal  canal hemorrhoids.   COLON:  The colonic mucosa was surveyed from the rectosigmoid junction  through the left transverse and right colon to the area of the appendiceal  orifice, ileocecal valve, and cecum.  These structures were well seen and  photographed for the record.  The patient's colonic mucosa all the way to  the cecum appeared normal.  I intubated the terminal ileum to 10 cm and this  segment of GI tract also appeared normal.   From this level  the scope was slowly withdrawn.  All previously mentioned  mucosal surfaces were again seen; and, again, no other abnormalities were  observed.  The patient tolerated the procedure well and was reacted in  endoscopy.   IMPRESSION:  1. Minimal anal canal/internal hemorrhoids, otherwise normal colonic mucosa.  2. Normal terminal ileum.   DISCUSSION:  Ms. Kenealy's symptoms sound more like ischemic or segmental  colitis which is a self-limiting process.  The symptoms really are atypical  particularly the diarrhea component for just simply internal hemorrhoids.  In addition, I am doubtful that this is representative of a rapid transit  upper GI bleed although nonsteroidals may have predisposed her to bleeding.   RECOMMENDATIONS:  1. Stop smoking.  2. CBC.  3. Minimize Voltaren.  4. Further recommendations to follow pending results of CBC.  She may need     an EGD.                                               Jonathon Bellows, M.D.    RMR/MEDQ  D:  07/17/2002  T:  07/17/2002  Job:  [id number]   cc:   Madelin Rear. Sherwood Gambler, M.D.  [address]  [place]  [place] [address]  Fax: [phone]

## 2010-07-25 NOTE — Op Note (Signed)
NAME:  Adrienne [name], Adrienne [name]                          ACCOUNT NO.:  [id number]   MEDICAL RECORD NO.:  [id number]                   PATIENT TYPE:  AMB   LOCATION:  DAY                                  FACILITY:  [facility]   PHYSICIAN:  R. Roetta Sessions, M.D.              DATE OF BIRTH:  Jun 12, 1955   DATE OF PROCEDURE:  11/16/2002  DATE OF DISCHARGE:                                 OPERATIVE REPORT   PROCEDURE:  Surveillance esophagogastroduodenoscopy.   ENDOSCOPIST:  Gerrit Friends. Rourk, M.D.   INDICATIONS FOR PROCEDURE:  The patient is a 55 year old lady who was found  to have prepyloric gastric ulcers earlier this year felt to be related to  Voltaren and H. pylori serologies were negative.  She was also found to have  short segment Barrett's esophagus; Barrett's biopsy proved; and salmon-  colored epithelium in the UES.  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  She has refrained from taking any nonsteroidal agents. She  continues to take Prilosec OTC.  She is here to confirm that the gastric  ulcers previously seen have healed.  This approach has been discussed with  the patient.  The potential risks, benefits, and alternatives have been  reviewed; questions answered.  Please see my handwritten H&P.   PROCEDURE NOTE:  O2 saturation, blood pressure, pulse and respirations were  monitored throughout the entire procedure.  Conscious sedation: Versed 4 mg  IV, Demerol 75 mg IV in divided doses.   INSTRUMENT:  Olympus video chip adult gastroscope.   FINDINGS:  Examination of the tubular esophagus again revealed a patch of  salmon-colored epithelium up in the UES.  There were also multiple tongues  of salmon-colored epithelium coming up into the distal esophagus as seen  previously.  The EG junction was patulous.  The mucosa of the esophagus  otherwise appeared normal.  The EG junction was easily traversed.   STOMACH:  The gastric cavity was empty.  It insufflated well with air.  A  thorough examination of the gastric mucosa including a retroflex view of the  proximal stomach and esophagogastric junction demonstrated only a small  hiatal hernia.  The previously noted gastric ulcers were completely healed.  The pylorus was patent and easily traversed.   Examination of the bulb and the second portion appeared normal.   THERAPEUTIC/DIAGNOSTIC MANEUVERS:  None.   The patient tolerated the procedure well and was reacted in endoscopy.   IMPRESSION:  1. Salmon-colored epithelium in the upper esophageal sphincter, previously     biopsied ectopic gastric mucosa.  2. Salmon-colored epithelium at the distal esophagus previously biopsied     revealed Barrett's esophagus otherwise the esophageal mucosa appeared     normal.  3. Patulous esophagogastric junction.  4. Small hiatal hernia.  5. Gastric mucosa otherwise appeared normal.  Normal D1 and D2.   RECOMMENDATIONS:  1. The patient is absolutely to refrain from taking any  nonsteroidals in the     future including Voltaren, Toradol, etc.  2. Continue Prilosec OTC daily.  3. Repeat EGD with surveillance biopsies of the esophagus in 3 years.                                               Jonathon Bellows, M.D.    RMR/MEDQ  D:  11/16/2002  T:  11/16/2002  Job:  [id number]   cc:   Kirk Ruths, M.D.  [address]  [place]  [place] [address]  Fax: [phone]

## 2010-07-25 NOTE — Procedures (Signed)
NAMEADVIKA, MCLELLAND NO.:  [id number]   MEDICAL RECORD NO.:  [id number]          PATIENT TYPE:  EMS   LOCATION:  ED                            FACILITY:  [facility]   PHYSICIAN:  Edward L. Juanetta Gosling, M.D.DATE OF BIRTH:  1955-09-08   DATE OF PROCEDURE:  10/13/2004  DATE OF DISCHARGE:                                EKG INTERPRETATION   RESULTS:  The rhythm is sinus rhythm with rate in 70s.  There were multiple  PVCs.  The axis is normal.  There are nonspecific T-wave abnormalities.   IMPRESSION:  Abnormal electrocardiogram.       ELH/MEDQ  D:  10/13/2004  T:  10/14/2004  Job:  [id number]

## 2010-07-25 NOTE — H&P (Signed)
NAME:  Adrienne [name], Adrienne [name]                          ACCOUNT NO.:  [id number]   MEDICAL RECORD NO.:  [id number]                   PATIENT TYPE:  AMB   LOCATION:  DAY                                  FACILITY:  [facility]   PHYSICIAN:  Vickki Hearing, M.D.           DATE OF BIRTH:  04/20/1955   DATE OF ADMISSION:  DATE OF DISCHARGE:                                HISTORY & PHYSICAL   CHIEF COMPLAINT:  Right shoulder pain.   HISTORY OF PRESENT ILLNESS:  This is a 55 year old female who presented with  aching, throbbing right shoulder pain in May without trauma.  This started  when she was moving furniture.  She has a history of ulcers, and cannot take  anti-inflammatories.  She has been having night pain, and it did not respond  to therapeutic exercise and 2 cortisone injections.  She is allergic to  codeine, and has to take Demerol, as well.  Weakness is not a major problem,  but pain over the [facility] joint and right shoulder deltoid area is.  Motion is  also a problem.  She wishes to have surgical treatment.   REVIEW OF SYSTEMS:  Anxiety panic attacks.   ALLERGIES:  1. PENICILLIN.  2. HYDROCODONE.  3. CODEINE which causes headache.   MEDICAL PROBLEMS:  1. Ulcer.  2. Acid reflux.  3. Anxiety.   SURGERY:  1. Partial hysterectomy.  2. Appendectomy.  3. Lumpectomy, left breast.   MEDICATION:  1. Aciphex.  2. Multivitamin.  3. Black cohosh.  4. Ambien.  5. [facility] Wart.   FAMILY HISTORY:  Heart disease and cancer.   SOCIAL HISTORY:  Married.  Smoking one pack per day.  Alcohol use - no.  Caffeine use - no.  Highest grade completed - high school, and then  associate degree after 2 years.   PHYSICAL EXAMINATION:  VITAL SIGNS:  Weight 168, pulse 68, respiratory rate  18.  GENERAL APPEARANCE:  Well-developed and nourished.  Hygiene normal.  CARDIOVASCULAR:  Pulses and temperature normal.  No edema, tenderness,  swelling, or varicose veins, lymph nodes.  CERVICAL SPINE:   Negative.  MUSCULOSKELETAL:  __________ normal.  EXTREMITIES:  Right shoulder - there is tenderness in the posterior and  anterior deltoid.  AC joint is tender.  Biceps nontender.  Range of motion -  flexion 150 with pain, abduction 90 with pain, internal rotation thoracic  level 10 with pain.  Stability and strength are normal.  Impingement sign is  positive.  Left upper extremity - range of motion, strength, stability, and  alignment are normal.  Skin in all 4 extremities is normal.  Lower  extremities - joint alignment normal, muscle tone good.  NEUROLOGIC/PSYCHIATRIC:  Finger-to-nose normal.  Deep tendon reflexes  normal.  Sensation normal.  Psychiatry exam normal.  Mood normal.   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   OVERALL IMPRESSION:  AC joint arthritis, rotator cuff tear, infraspinatus  and supraspinatus.   RECOMMENDATIONS:  Arthroscopy, right shoulder, subacromial decompression,  open AC joint excision, and possible open rotator cuff repair.     ___________________________________________                                         Vickki Hearing, M.D.   SEH/MEDQ  D:  04/09/2003  T:  04/09/2003  Job:  [id number]

## 2010-07-25 NOTE — H&P (Signed)
NAMEMARKAN, CAZAREZ                ACCOUNT NO.:  [id number]   MEDICAL RECORD NO.:  [id number]          PATIENT TYPE:  AMB   LOCATION:                                FACILITY:  [facility]   PHYSICIAN:  R. Roetta Sessions, M.D. DATE OF BIRTH:  [birth year]   DATE OF ADMISSION:  DATE OF DISCHARGE:  LH                              HISTORY & PHYSICAL   CHIEF COMPLAINT:  For colonoscopy/EGD.   HISTORY OF PRESENT ILLNESS:  Ms. Lenig is a 55 year old Caucasian  female who presents today for evaluation of rectal bleeding on two  occasions.  She has had a moderate volume of bright red blood.  On one  occasion it had a clot.  She denies any proctalgia.  She does reveal  that she is having rectal spasms.  Denies any abdominal pain with the  bleeding.  Denies any bright rectal pruritus.  Denies any fevers,  chills, nausea or vomiting.  She is on aspirin 81 mg daily, but denies  any other NSAID use.  She did have a colonoscopy.  It was back 3-1/2  years ago.  She was found to have internal hemorrhoids, otherwise a  normal exam.  She is scheduled to have a follow-up EGD, given her  history of Barrett's esophagus.   PAST MEDICAL/SURGICAL HISTORY:  1. Depression.  2. An appendectomy.  3. A tonsillectomy.  4. A partial hysterectomy.  5. Rotator cuff surgery.  6. History of Barrett's esophagus.  The last EGD was November 16, 2002, by Dr. Jonathon Bellows.  She was found to have on exam of      the epithelium, upper esophageal sphincter as well as in the distal      esophagus.  7. She also had a small hiatal hernia.  8. History of restless leg syndrome.  9. Chronic migraines.  10.Minimal leukocytosis.   CURRENT MEDICATIONS:  1. Zoloft 200 mg q.h.s.  2. Trazodone 50 mg q.h.s.  3. Clonazepam 0.5 mg q.i.d.  4. Aspirin 81 mg daily.  5. Multivitamin daily.  6. Prilosec 20 mg daily.  7. Flexeril 10 mg p.r.n.   ALLERGIES:  CODEINE AND PENICILLIN.   FAMILY HISTORY:  No family history of colorectal  carcinoma or other  chronic GI problems.   SOCIAL HISTORY:  She is married with three healthy children.  She is  unemployed at this time.  She has a 30-pack-year history of tobacco use.  An alcoholic beverages once every four to five months.  Denies any drug  use.   REVIEW OF SYSTEMS:  CONSTITUTIONAL:  Weight is stable.  Denies fevers or  chills.  CARDIOVASCULAR:  Denies chest pain, orthopnea, shortness of  breath, dyspnea, cough or hemoptysis.  GI:  See the HPI.  Denies any  heartburn, indigestion, dysphagia or odynophagia.  She does take  Prilosec 20 mg daily.   PHYSICAL EXAMINATION:  VITAL SIGNS:  Weight 172 pounds, height 65  inches, temperature 98.4 degrees, blood pressure 100/68, pulse 64.  GENERAL:  This is a 55 year old Caucasian female who is alert, oriented,  pleasant  and cooperative, in no acute distress.  HEENT:  Sclerae clear, anicteric.  Conjunctivae pink.  Oropharynx pink  and moist without any lesions.  NECK:  Supple, with no mass or thyromegaly.  LUNGS:  Clear to auscultation bilaterally.  HEART:  Regular rate and rhythm.  Normal S1 and S2.  No murmurs, rubs,  thrills or gallops.  ABDOMEN:  Positive bowel sounds x4.  No bruits auscultated, soft,  nontender.  No palpable masses or hepatosplenomegaly.  No rebound  tenderness or guarding.  EXTREMITIES:  Without clubbing or edema bilaterally.  RECTAL:  No external lesions.  Good sphincter tone.  No anterior masses  palpated.  Small amount of light brown stool obtained from the vault  which is Hemoccult-negative.   IMPRESSION:  1. Ms. Lundquist is a 55 year old Caucasian female, who is day four      surveillance of her Barrett's esophagus.  2. She has a history of gastroesophageal reflux disease, well-      controlled on a proton pump inhibitor.  3. She presents today with two episodes of moderate volume rectal      bleeding.  A rectal examination is benign.  I suspect she may have      had a self-limited bout of  ischemic colitis or less likely bleeding      from a benign anorectal source.  Given the fact that it has been      almost four years since her last colonoscopy, and she is having an      esophagogastroduodenoscopy, will proceed with a diagnostic      colonoscopy at the same time.   PLAN:  1. Anusol HC suppositories, one per rectum b.i.d., #20, with no      refills.  2. EGD and colonoscopy with Dr. Jonathon Bellows in the near future.      I have discussed these procedures, including the risks and      benefits, including but not limited to bleeding, infection,      perforation, drug reaction or __________.  3. Continue Prilosec 20 mg daily.      Nicholas Lose, N.P.      Jonathon Bellows, M.D.  Electronically Signed   KC/MEDQ  D:  04/20/2006  T:  04/20/2006  Job:  [id number]   cc:   Kirk Ruths, M.D.  Fax: [phone]

## 2010-07-25 NOTE — Op Note (Signed)
NAME:  Adrienne [name], Adrienne [name]                          ACCOUNT NO.:  [id number]   MEDICAL RECORD NO.:  [id number]                   PATIENT TYPE:  AMB   LOCATION:  DAY                                  FACILITY:  [facility]   PHYSICIAN:  R. Roetta Sessions, M.D.              DATE OF BIRTH:  Apr 09, 1955   DATE OF PROCEDURE:  07/21/2002  DATE OF DISCHARGE:                                 OPERATIVE REPORT   PROCEDURE:  Esophagogastroduodenoscopy with biopsy.   INDICATIONS FOR PROCEDURE:  The patient is a 55 year old lady with a recent  episode of abdominal cramps, diarrhea and gross blood per rectum which was  self limiting. Colonoscopy just a few days ago demonstrated only anal canal  hemorrhoids.  Otherwise, the rectum, the colon, and the terminal ileum  appeared normal. She has been taking Voltaren and aspirin. She had a CBC  with a white count of 16,000, hemoglobin was also 16 __________ for  colonoscopy. She was asymptomatic.  She continues to be asymptomatic. I have  asked her to stop her Voltaren because she is taking multiple  nonsteroidal's, I elected to bring her back to perform EGD and look at her  upper GI tract. This approach has been discussed with the patient. Please  see the updated handwritten H&P from today.   MONITORING:  O2 saturation, blood pressure, pulse, and respirations were  monitored throughout the entire procedure.   CONSCIOUS SEDATION:  Versed 3 mg IV, Demerol 75 mg IV in divided doses.   INSTRUMENTS:  Olympus video chip adult gastroscope.   FINDINGS:  Examination of the tubular esophagus revealed two kissing areas  of salmon colored epithelium up in the upper esophageal sphincter. Please  see photos. The remainder of the esophageal mucosa was normal until the  distal esophagus was reached. There were two 3 cm tongues of salmon  colored epithelium coming up above the EG junction, please see photos.  Otherwise, esophageal mucosa appeared normal.  The EG junction was  somewhat  patulous and easily traversed.   STOMACH:  The gastric cavity was empty and insufflated well with air. A  thorough examination of the gastric mucosa including a retroflexed view of  the proximal stomach and esophagogastric junction demonstrated two  approximately 0.6 cm prepyloric ulcers with surrounding erosions and  erythema. There did not appear to be an infiltrative process. The remainder  of her gastric mucosa appeared normal. The pylorus was patent and easily  traversed. Examination of the bulb revealed patchy erythema and erosions of  the bulb. There was no ulcer. D2 appeared normal.   THERAPEUTIC/DIAGNOSTIC MANEUVERS:  The abnormal mucosa at the level of the  upper esophageal sphincter was biopsied. The two tongues of salmon colored  epithelium in the distal esophagus were biopsied and submitted separately.   The patient tolerated the procedure well and was reacted in endoscopy.    IMPRESSION:  1. Salmon colored epithelium  as well as upper esophageal sphincter most     likely representing a type of gastric epithelium biopsy. Two tongues of     salmon colored epithelium extending into the distal esophagus from the EG     junction discerning for at least short segment Barrett's esophagus     biopsied. The remainder of the esophageal mucosa appeared normal.  2. Two small prepyloric ulcers with surrounding erosions that appeared to be     benign. The remainder of the gastric mucosa appeared normal.  3. Bulbar erosions, otherwise, no ulcer, otherwise D1 and D2 appeared     normal.   RECOMMENDATIONS:  1. Stop aspirin and Voltaren, H. pylori serologies today.  2. Begin Aciphex one tablet daily.  3. Stop smoking.  4. Office visit one month.  5. Followup on path.                                               Jonathon Bellows, M.D.    RMR/MEDQ  D:  07/21/2002  T:  07/21/2002  Job:  [id number]   cc:   Madelin Rear. Sherwood Gambler, M.D.  [address]  [place]  [place] [address]   Fax: [phone]

## 2010-07-25 NOTE — Procedures (Signed)
[facility]  Patient:    CHERRY, TURLINGTON Visit Number: [id number] MRN: [id number]          Service Type: OUT Location: RAD Attending Physician:  Pricilla Riffle Dictated by:   Delton See, P.A. Proc. Date: 01/18/01 Admit Date:  01/18/2001   CC:         Karleen Hampshire, M.D.   Stress Test  DATE OF BIRTH:  1955/04/04  BRIEF HISTORY:  Ms. Michie is a pleasant 55 year old female who was recently seen in the office by Dr. Dietrich Pates for evaluation of paroxysmal nocturnal dyspnea.  She has a history of tobacco use.  She has a positive family history for early coronary artery disease.  She does have a history of anxiety.  She was scheduled for a stress echocardiogram.  Prior to the study today the patient reports no recent chest pain.  Her baseline EKG shows sinus rhythm, rate 78 beats per minute without ischemic changes.  Her target heart rate is 147 beats per minute.  Her blood pressure is 140/90.  The patient was able to exercise for a total of 8 minutes reaching a heart rate of 148 beats per minute.  Her blood pressure was 190/96.  She had no chest pain.  She did have slight inferolateral ST segment depression.  There were some problems with the EKG leads and there was a lot of artifact during this study.  She had some PVCs in recovery.  The echocardiogram images are pending at the time of this dictation. Dictated by:   Delton See, P.A. Attending Physician:  Pricilla Riffle DD:  01/18/01 TD:  01/18/01 Job: [id number]

## 2010-11-27 LAB — CBC
MCHC: 33.6
MCV: 93.6
Platelets: 307
WBC: 13.7 — ABNORMAL HIGH

## 2010-11-27 LAB — DIFFERENTIAL
Basophils Relative: 1
Eosinophils Absolute: 0.3
Neutrophils Relative %: 60

## 2010-12-09 LAB — URINALYSIS, ROUTINE W REFLEX MICROSCOPIC
Bilirubin Urine: NEGATIVE
Glucose, UA: >1000 — AB
Hgb urine dipstick: NEGATIVE
Ketones, ur: NEGATIVE
Protein, ur: NEGATIVE
Urobilinogen, UA: 0.2

## 2010-12-09 LAB — COMPREHENSIVE METABOLIC PANEL
ALT: 28
AST: 19
Calcium: 8.3 — ABNORMAL LOW
GFR calc Af Amer: >60
Sodium: 135
Total Protein: 6

## 2010-12-09 LAB — CBC
HCT: 36
HCT: 36.6
HCT: 37.3
Hemoglobin: 12.5
MCHC: 33.6
MCHC: 33.6
MCHC: 33.8
MCHC: 34
MCV: 94.1
MCV: 95
Platelets: 216
Platelets: 243
Platelets: 294
RBC: 3.95
RDW: 13.6
RDW: 13.8
WBC: 12.8 — ABNORMAL HIGH

## 2010-12-09 LAB — GLUCOSE, CAPILLARY
Glucose-Capillary: 130 — ABNORMAL HIGH
Glucose-Capillary: 171 — ABNORMAL HIGH
Glucose-Capillary: 174 — ABNORMAL HIGH
Glucose-Capillary: 190 — ABNORMAL HIGH
Glucose-Capillary: 207 — ABNORMAL HIGH
Glucose-Capillary: 210 — ABNORMAL HIGH
Glucose-Capillary: 221 — ABNORMAL HIGH

## 2010-12-09 LAB — BASIC METABOLIC PANEL
BUN: 4 — ABNORMAL LOW
CO2: 30
CO2: 33 — ABNORMAL HIGH
Calcium: 7.8 — ABNORMAL LOW
Chloride: 100
Creatinine, Ser: 0.66
GFR calc Af Amer: >60
GFR calc non Af Amer: >60
Glucose, Bld: 148 — ABNORMAL HIGH
Glucose, Bld: 187 — ABNORMAL HIGH
Potassium: 3.9
Potassium: 4
Sodium: 135

## 2010-12-09 LAB — PROTIME-INR
INR: 1.3
Prothrombin Time: 12.6
Prothrombin Time: 14.2
Prothrombin Time: 19.1 — ABNORMAL HIGH

## 2010-12-09 LAB — URINE MICROSCOPIC-ADD ON

## 2010-12-09 LAB — TYPE AND SCREEN
ABO/RH(D): O POS
Antibody Screen: NEGATIVE

## 2012-03-09 HISTORY — PX: OTHER SURGICAL HISTORY: SHX169

## 2012-12-19 ENCOUNTER — Other Ambulatory Visit: Payer: Self-pay | Admitting: Gynecology

## 2013-03-02 ENCOUNTER — Encounter (HOSPITAL_COMMUNITY): Payer: Self-pay | Admitting: Emergency Medicine

## 2013-03-02 ENCOUNTER — Emergency Department (HOSPITAL_COMMUNITY)
Admission: EM | Admit: 2013-03-02 | Discharge: 2013-03-02 | Disposition: A | Discharge status: Home / Self Care | Payer: Medicare Other | Source: Home / Self Care | Attending: Emergency Medicine | Admitting: Emergency Medicine

## 2013-03-02 ENCOUNTER — Emergency Department (HOSPITAL_COMMUNITY): Payer: Medicare Other

## 2013-03-02 ENCOUNTER — Inpatient Hospital Stay (HOSPITAL_COMMUNITY)
Admission: EM | Admit: 2013-03-02 | Discharge: 2013-03-10 | DRG: 493 | Disposition: A | Discharge status: Skilled Nursing Facility | Payer: Medicare Other | Attending: Family Medicine | Admitting: Family Medicine

## 2013-03-02 ENCOUNTER — Encounter (HOSPITAL_COMMUNITY): Payer: Self-pay | Admitting: Family Medicine

## 2013-03-02 DIAGNOSIS — Z888 Allergy status to other drugs, medicaments and biological substances status: Secondary | ICD-10-CM

## 2013-03-02 DIAGNOSIS — Z9089 Acquired absence of other organs: Secondary | ICD-10-CM

## 2013-03-02 DIAGNOSIS — K2270 Barrett's esophagus without dysplasia: Secondary | ICD-10-CM | POA: Diagnosis present

## 2013-03-02 DIAGNOSIS — Z794 Long term (current) use of insulin: Secondary | ICD-10-CM

## 2013-03-02 DIAGNOSIS — Z87891 Personal history of nicotine dependence: Secondary | ICD-10-CM

## 2013-03-02 DIAGNOSIS — D649 Anemia, unspecified: Secondary | ICD-10-CM | POA: Insufficient documentation

## 2013-03-02 DIAGNOSIS — M549 Dorsalgia, unspecified: Secondary | ICD-10-CM

## 2013-03-02 DIAGNOSIS — H3530 Unspecified macular degeneration: Secondary | ICD-10-CM | POA: Diagnosis present

## 2013-03-02 DIAGNOSIS — G4730 Sleep apnea, unspecified: Secondary | ICD-10-CM | POA: Diagnosis present

## 2013-03-02 DIAGNOSIS — IMO0001 Reserved for inherently not codable concepts without codable children: Secondary | ICD-10-CM | POA: Diagnosis present

## 2013-03-02 DIAGNOSIS — F319 Bipolar disorder, unspecified: Secondary | ICD-10-CM | POA: Diagnosis present

## 2013-03-02 DIAGNOSIS — J45909 Unspecified asthma, uncomplicated: Secondary | ICD-10-CM | POA: Insufficient documentation

## 2013-03-02 DIAGNOSIS — Z882 Allergy status to sulfonamides status: Secondary | ICD-10-CM

## 2013-03-02 DIAGNOSIS — G4733 Obstructive sleep apnea (adult) (pediatric): Secondary | ICD-10-CM | POA: Diagnosis present

## 2013-03-02 DIAGNOSIS — Z880 Allergy status to penicillin: Secondary | ICD-10-CM

## 2013-03-02 DIAGNOSIS — Z79899 Other long term (current) drug therapy: Secondary | ICD-10-CM | POA: Insufficient documentation

## 2013-03-02 DIAGNOSIS — F17200 Nicotine dependence, unspecified, uncomplicated: Secondary | ICD-10-CM | POA: Insufficient documentation

## 2013-03-02 DIAGNOSIS — R635 Abnormal weight gain: Secondary | ICD-10-CM

## 2013-03-02 DIAGNOSIS — E119 Type 2 diabetes mellitus without complications: Secondary | ICD-10-CM | POA: Diagnosis present

## 2013-03-02 DIAGNOSIS — M542 Cervicalgia: Secondary | ICD-10-CM

## 2013-03-02 DIAGNOSIS — S82109A Unspecified fracture of upper end of unspecified tibia, initial encounter for closed fracture: Secondary | ICD-10-CM | POA: Insufficient documentation

## 2013-03-02 DIAGNOSIS — Z87820 Personal history of traumatic brain injury: Secondary | ICD-10-CM | POA: Insufficient documentation

## 2013-03-02 DIAGNOSIS — F411 Generalized anxiety disorder: Secondary | ICD-10-CM | POA: Diagnosis present

## 2013-03-02 DIAGNOSIS — T148XXA Other injury of unspecified body region, initial encounter: Secondary | ICD-10-CM

## 2013-03-02 DIAGNOSIS — M25819 Other specified joint disorders, unspecified shoulder: Secondary | ICD-10-CM | POA: Diagnosis present

## 2013-03-02 DIAGNOSIS — M169 Osteoarthritis of hip, unspecified: Secondary | ICD-10-CM

## 2013-03-02 DIAGNOSIS — Z96649 Presence of unspecified artificial hip joint: Secondary | ICD-10-CM

## 2013-03-02 DIAGNOSIS — S82202A Unspecified fracture of shaft of left tibia, initial encounter for closed fracture: Secondary | ICD-10-CM

## 2013-03-02 DIAGNOSIS — K219 Gastro-esophageal reflux disease without esophagitis: Secondary | ICD-10-CM | POA: Diagnosis present

## 2013-03-02 DIAGNOSIS — S0990XA Unspecified injury of head, initial encounter: Secondary | ICD-10-CM

## 2013-03-02 DIAGNOSIS — M129 Arthropathy, unspecified: Secondary | ICD-10-CM | POA: Diagnosis present

## 2013-03-02 DIAGNOSIS — G2581 Restless legs syndrome: Secondary | ICD-10-CM | POA: Diagnosis present

## 2013-03-02 DIAGNOSIS — G43909 Migraine, unspecified, not intractable, without status migrainosus: Secondary | ICD-10-CM | POA: Diagnosis present

## 2013-03-02 DIAGNOSIS — R5381 Other malaise: Secondary | ICD-10-CM

## 2013-03-02 DIAGNOSIS — Z8669 Personal history of other diseases of the nervous system and sense organs: Secondary | ICD-10-CM | POA: Insufficient documentation

## 2013-03-02 DIAGNOSIS — L0390 Cellulitis, unspecified: Secondary | ICD-10-CM

## 2013-03-02 DIAGNOSIS — Y92009 Unspecified place in unspecified non-institutional (private) residence as the place of occurrence of the external cause: Secondary | ICD-10-CM

## 2013-03-02 DIAGNOSIS — K1370 Unspecified lesions of oral mucosa: Secondary | ICD-10-CM | POA: Diagnosis not present

## 2013-03-02 DIAGNOSIS — Z833 Family history of diabetes mellitus: Secondary | ICD-10-CM

## 2013-03-02 DIAGNOSIS — Y9210 Unspecified residential institution as the place of occurrence of the external cause: Secondary | ICD-10-CM | POA: Diagnosis not present

## 2013-03-02 DIAGNOSIS — J309 Allergic rhinitis, unspecified: Secondary | ICD-10-CM | POA: Diagnosis present

## 2013-03-02 DIAGNOSIS — R519 Headache, unspecified: Secondary | ICD-10-CM | POA: Diagnosis present

## 2013-03-02 DIAGNOSIS — F329 Major depressive disorder, single episode, unspecified: Secondary | ICD-10-CM | POA: Diagnosis present

## 2013-03-02 DIAGNOSIS — Y929 Unspecified place or not applicable: Secondary | ICD-10-CM | POA: Insufficient documentation

## 2013-03-02 DIAGNOSIS — R51 Headache: Secondary | ICD-10-CM

## 2013-03-02 DIAGNOSIS — M5126 Other intervertebral disc displacement, lumbar region: Secondary | ICD-10-CM

## 2013-03-02 DIAGNOSIS — Z7982 Long term (current) use of aspirin: Secondary | ICD-10-CM

## 2013-03-02 DIAGNOSIS — R569 Unspecified convulsions: Secondary | ICD-10-CM

## 2013-03-02 DIAGNOSIS — Y9389 Activity, other specified: Secondary | ICD-10-CM | POA: Insufficient documentation

## 2013-03-02 DIAGNOSIS — G40909 Epilepsy, unspecified, not intractable, without status epilepticus: Secondary | ICD-10-CM | POA: Diagnosis present

## 2013-03-02 DIAGNOSIS — M19019 Primary osteoarthritis, unspecified shoulder: Secondary | ICD-10-CM

## 2013-03-02 DIAGNOSIS — T368X5A Adverse effect of other systemic antibiotics, initial encounter: Secondary | ICD-10-CM | POA: Diagnosis not present

## 2013-03-02 DIAGNOSIS — IMO0002 Reserved for concepts with insufficient information to code with codable children: Secondary | ICD-10-CM | POA: Diagnosis not present

## 2013-03-02 DIAGNOSIS — S82142A Displaced bicondylar fracture of left tibia, initial encounter for closed fracture: Secondary | ICD-10-CM

## 2013-03-02 DIAGNOSIS — K5900 Constipation, unspecified: Secondary | ICD-10-CM | POA: Diagnosis not present

## 2013-03-02 DIAGNOSIS — W108XXA Fall (on) (from) other stairs and steps, initial encounter: Secondary | ICD-10-CM | POA: Insufficient documentation

## 2013-03-02 DIAGNOSIS — M545 Low back pain: Secondary | ICD-10-CM

## 2013-03-02 HISTORY — DX: Presence of right artificial hip joint: Z96.641

## 2013-03-02 HISTORY — DX: Other specified postprocedural states: Z98.890

## 2013-03-02 LAB — CBC WITH DIFFERENTIAL/PLATELET
Basophils Absolute: 0 10*3/uL (ref 0.0–0.1)
Eosinophils Absolute: 0.1 10*3/uL (ref 0.0–0.7)
Eosinophils Relative: 1 % (ref 0–5)
HCT: 45.1 % (ref 36.0–46.0)
Lymphocytes Relative: 24 % (ref 12–46)
MCH: 31 pg (ref 26.0–34.0)
MCHC: 33.5 g/dL (ref 30.0–36.0)
MCV: 92.6 fL (ref 78.0–100.0)
Monocytes Absolute: 1.4 10*3/uL — ABNORMAL HIGH (ref 0.1–1.0)
RDW: 14.1 % (ref 11.5–15.5)
WBC: 14.2 10*3/uL — ABNORMAL HIGH (ref 4.0–10.5)

## 2013-03-02 LAB — BASIC METABOLIC PANEL
CO2: 21 mEq/L (ref 19–32)
Calcium: 9 mg/dL (ref 8.4–10.5)
Creatinine, Ser: 0.64 mg/dL (ref 0.50–1.10)

## 2013-03-02 LAB — PROTIME-INR
INR: 0.96 (ref 0.00–1.49)
Prothrombin Time: 12.6 seconds (ref 11.6–15.2)

## 2013-03-02 MED ORDER — NALOXONE HCL 0.4 MG/ML IJ SOLN: 0.4000 mg | INTRAMUSCULAR | Status: DC | PRN

## 2013-03-02 MED ORDER — ALBUTEROL SULFATE (5 MG/ML) 0.5% IN NEBU: 2.5000 mg | INHALATION_SOLUTION | RESPIRATORY_TRACT | Status: DC | PRN

## 2013-03-02 MED ORDER — VENLAFAXINE HCL ER 150 MG PO CP24
150.0000 mg | ORAL_CAPSULE | Freq: Every day | ORAL | Status: DC
  Administered 2013-03-03 – 2013-03-10 (×7): 150 mg via ORAL
  Filled 2013-03-02 (×11): qty 1

## 2013-03-02 MED ORDER — ALPRAZOLAM 0.5 MG PO TABS
1.0000 mg | ORAL_TABLET | Freq: Every evening | ORAL | Status: DC | PRN
  Administered 2013-03-03 – 2013-03-09 (×6): 1 mg via ORAL
  Filled 2013-03-02 (×7): qty 2

## 2013-03-02 MED ORDER — ACETAMINOPHEN 325 MG PO TABS
650.0000 mg | ORAL_TABLET | Freq: Four times a day (QID) | ORAL | Status: DC | PRN
  Administered 2013-03-10: 650 mg via ORAL
  Filled 2013-03-02: qty 2

## 2013-03-02 MED ORDER — HYDROMORPHONE HCL PF 1 MG/ML IJ SOLN
1.0000 mg | Freq: Once | INTRAMUSCULAR | Status: AC
  Administered 2013-03-02: 1 mg via INTRAVENOUS
  Filled 2013-03-02: qty 1

## 2013-03-02 MED ORDER — DIPHENHYDRAMINE HCL 12.5 MG/5ML PO ELIX
12.5000 mg | ORAL_SOLUTION | Freq: Four times a day (QID) | ORAL | Status: DC | PRN
  Administered 2013-03-02 – 2013-03-05 (×9): 12.5 mg via ORAL
  Filled 2013-03-02 (×9): qty 10

## 2013-03-02 MED ORDER — SODIUM CHLORIDE 0.9 % IV SOLN: 250.0000 mL | INTRAVENOUS | Status: DC | PRN

## 2013-03-02 MED ORDER — SODIUM CHLORIDE 0.9 % IJ SOLN
3.0000 mL | Freq: Two times a day (BID) | INTRAMUSCULAR | Status: DC
  Administered 2013-03-03 – 2013-03-09 (×6): 3 mL via INTRAVENOUS

## 2013-03-02 MED ORDER — METHYLPHENIDATE HCL 5 MG PO TABS
10.0000 mg | ORAL_TABLET | Freq: Two times a day (BID) | ORAL | Status: DC
  Administered 2013-03-03 – 2013-03-09 (×6): 10 mg via ORAL
  Filled 2013-03-02 (×10): qty 2

## 2013-03-02 MED ORDER — MORPHINE SULFATE 4 MG/ML IJ SOLN
4.0000 mg | Freq: Once | INTRAMUSCULAR | Status: AC
  Administered 2013-03-02: 4 mg via INTRAVENOUS
  Filled 2013-03-02: qty 1

## 2013-03-02 MED ORDER — FLEET ENEMA 7-19 GM/118ML RE ENEM: 1.0000 | ENEMA | Freq: Once | RECTAL | Status: AC | PRN

## 2013-03-02 MED ORDER — ACETAMINOPHEN 650 MG RE SUPP: 650.0000 mg | Freq: Four times a day (QID) | RECTAL | Status: DC | PRN

## 2013-03-02 MED ORDER — DIPHENHYDRAMINE HCL 50 MG/ML IJ SOLN
12.5000 mg | Freq: Four times a day (QID) | INTRAMUSCULAR | Status: DC | PRN
  Administered 2013-03-06 (×2): 12.5 mg via INTRAVENOUS
  Filled 2013-03-02 (×3): qty 1

## 2013-03-02 MED ORDER — CHLORHEXIDINE GLUCONATE 4 % EX LIQD
60.0000 mL | Freq: Once | CUTANEOUS | Status: DC
  Filled 2013-03-02: qty 60

## 2013-03-02 MED ORDER — ACETAMINOPHEN 500 MG PO TABS
1000.0000 mg | ORAL_TABLET | Freq: Once | ORAL | Status: AC
  Administered 2013-03-02: 1000 mg via ORAL
  Filled 2013-03-02: qty 2

## 2013-03-02 MED ORDER — ONDANSETRON HCL 4 MG/2ML IJ SOLN: 4.0000 mg | Freq: Four times a day (QID) | INTRAMUSCULAR | Status: DC | PRN

## 2013-03-02 MED ORDER — HYDROMORPHONE HCL PF 1 MG/ML IJ SOLN
INTRAMUSCULAR | Status: AC
  Filled 2013-03-02: qty 1

## 2013-03-02 MED ORDER — PANTOPRAZOLE SODIUM 40 MG PO TBEC
40.0000 mg | DELAYED_RELEASE_TABLET | Freq: Every day | ORAL | Status: DC
  Administered 2013-03-03 – 2013-03-10 (×7): 40 mg via ORAL
  Filled 2013-03-02 (×7): qty 1

## 2013-03-02 MED ORDER — MORPHINE SULFATE (PF) 1 MG/ML IV SOLN
INTRAVENOUS | Status: DC
  Administered 2013-03-02: 23:00:00 via INTRAVENOUS
  Administered 2013-03-03: 3 mg via INTRAVENOUS
  Administered 2013-03-03: 2.66 mg via INTRAVENOUS
  Administered 2013-03-03: 21:00:00 via INTRAVENOUS
  Administered 2013-03-03: 6 mg via INTRAVENOUS
  Administered 2013-03-03: 9 mg via INTRAVENOUS
  Administered 2013-03-03 – 2013-03-04 (×2): 4.5 mg via INTRAVENOUS
  Administered 2013-03-04: 1 mg via INTRAVENOUS
  Administered 2013-03-04: 3 mg via INTRAVENOUS
  Administered 2013-03-04 (×2): 4.5 mg via INTRAVENOUS
  Administered 2013-03-05: 3 mg via INTRAVENOUS
  Administered 2013-03-05: 1.5 mg via INTRAVENOUS
  Filled 2013-03-02 (×4): qty 25

## 2013-03-02 MED ORDER — SODIUM CHLORIDE 0.9 % IJ SOLN: 9.0000 mL | INTRAMUSCULAR | Status: DC | PRN

## 2013-03-02 MED ORDER — SODIUM CHLORIDE 0.9 % IJ SOLN: 3.0000 mL | INTRAMUSCULAR | Status: DC | PRN

## 2013-03-02 MED ORDER — CLINDAMYCIN PHOSPHATE 900 MG/50ML IV SOLN
900.0000 mg | INTRAVENOUS | Status: AC
  Filled 2013-03-02: qty 50

## 2013-03-02 MED ORDER — DEXTROSE-NACL 5-0.45 % IV SOLN
100.0000 mL/h | INTRAVENOUS | Status: DC
  Administered 2013-03-02 – 2013-03-03 (×2): 100 mL/h via INTRAVENOUS

## 2013-03-02 MED ORDER — DOCUSATE SODIUM 100 MG PO CAPS
100.0000 mg | ORAL_CAPSULE | Freq: Two times a day (BID) | ORAL | Status: DC
  Administered 2013-03-02 – 2013-03-09 (×14): 100 mg via ORAL
  Filled 2013-03-02 (×20): qty 1

## 2013-03-02 MED ORDER — HYDROMORPHONE HCL PF 1 MG/ML IJ SOLN
1.0000 mg | Freq: Once | INTRAMUSCULAR | Status: AC
  Administered 2013-03-02: 1 mg via INTRAVENOUS

## 2013-03-02 MED ORDER — DIPHENHYDRAMINE HCL 50 MG/ML IJ SOLN
25.0000 mg | Freq: Once | INTRAMUSCULAR | Status: AC
  Administered 2013-03-02: 25 mg via INTRAVENOUS
  Filled 2013-03-02: qty 1

## 2013-03-02 MED ORDER — DESVENLAFAXINE SUCCINATE ER 100 MG PO TB24: 100.0000 mg | ORAL_TABLET | Freq: Every day | ORAL | Status: DC

## 2013-03-02 MED ORDER — BISACODYL 5 MG PO TBEC
5.0000 mg | DELAYED_RELEASE_TABLET | Freq: Every day | ORAL | Status: DC | PRN
  Administered 2013-03-05: 5 mg via ORAL
  Filled 2013-03-02: qty 1

## 2013-03-02 MED ORDER — OXYCODONE-ACETAMINOPHEN 5-325 MG PO TABS: 2.0000 | ORAL_TABLET | ORAL | Status: DC | PRN

## 2013-03-02 MED ORDER — FUROSEMIDE 20 MG PO TABS
20.0000 mg | ORAL_TABLET | Freq: Every day | ORAL | Status: DC
  Administered 2013-03-03 – 2013-03-10 (×7): 20 mg via ORAL
  Filled 2013-03-02 (×8): qty 1

## 2013-03-02 MED ORDER — INSULIN ASPART 100 UNIT/ML ~~LOC~~ SOLN
0.0000 [IU] | Freq: Three times a day (TID) | SUBCUTANEOUS | Status: DC
  Administered 2013-03-03 (×2): 2 [IU] via SUBCUTANEOUS
  Administered 2013-03-03 – 2013-03-04 (×2): 3 [IU] via SUBCUTANEOUS
  Administered 2013-03-04 – 2013-03-10 (×10): 2 [IU] via SUBCUTANEOUS

## 2013-03-02 NOTE — ED Notes (Signed)
Bed: WA24 Expected date:  Expected time:  Means of arrival:  Comments: EMS knee pain 

## 2013-03-02 NOTE — ED Notes (Signed)
Pt with ortho md at bedside to assess. Will continue to monitor.

## 2013-03-02 NOTE — ED Notes (Signed)
Pt unable to get into family vehicle. Pt could not bear enough weight to lift herself into the vehicle. Pt able to ambulate in hallway with crutches 15 steps. Pt insistent on going home and not staying in the hospital. Family and pt agreed on [facility] picking up pt to take home.

## 2013-03-02 NOTE — ED Notes (Signed)
MD at bedside. 

## 2013-03-02 NOTE — ED Notes (Signed)
[facility] at bedside for transfer.  

## 2013-03-02 NOTE — ED Notes (Signed)
[facility] out of Ed with pt to transport to [facility].

## 2013-03-02 NOTE — ED Notes (Signed)
Per EMS pt tripped going up the stairs, pt was able to pull herself up into a sitting position but was unable to stand, pt c/o left knee pain. Pt was given 4mg  morphine and 25 mg of benadryl by EMS at approx 0040. Pt reports she has an allergy to morphine, rxn is itching, pt reports her pain is a 4/10 now after the morphine. Pt placed in left leg splint by EMS. Pt is A&O X4.

## 2013-03-02 NOTE — ED Notes (Signed)
ekg done

## 2013-03-02 NOTE — ED Notes (Signed)
Pt has a broken LT knee that was treated at [facility] last night.  Being that it was Christmas eve and pt has family here, she wanted to go home so she was taken home by [facility].  She was to be following up with Ortho w/possible surgery tomorrow.  She is back today b/c she can't tolerate the pain nor can she maneuver well at home.

## 2013-03-02 NOTE — ED Provider Notes (Signed)
CSN: [id number]     Arrival date & time 03/02/13  1502 History   First MD Initiated Contact with Patient 03/02/13 1519     Chief Complaint  Patient presents with  . Knee Pain   (Consider location/radiation/quality/duration/timing/severity/associated sxs/prior Treatment) HPI Comments: Patient presents to the ED with a chief complaint of knee pain.  She was seen early this morning after sustaining a tibial plateau fracture to her left knee.  She was adamant about going home, and was released with a knee immobilizer and crutches.  She returns 2/2 pain and inability to function.  She also had recent hip surgery on the contralateral hip, which makes ambulation with crutches nearly impossible.  She is requesting further pain control and admission to the hospital.  She states that her pain is 4/10.  The pain is tolerable as long as she is resting.  The history is provided by the patient. No language interpreter was used.    Past Medical History  Diagnosis Date  . Allergic rhinitis   . Anemia     leukocytosis/thrombocytosis--Dr. Mariel Sleet  . Asthma   . Depression     ? Bipolar Disorder--Dr, Mayford Knife  . GERD (gastroesophageal reflux disease)     Barrett's esophagus  . Low back pain   . Seizure disorder     ? absence  . Macular degeneration, bilateral     Dry-Dr. Lita Mains  . Arthritis   . Hx of migraines   . Brain injury     Closed head  . OSA (obstructive sleep apnea)   . RLS (restless legs syndrome)   . NIDDM (non-insulin dependent diabetes mellitus)    Past Surgical History  Procedure Laterality Date  . Colonoscopy  12/08    TCS/EGD: + Barrett's,+ gastritis, friable anal canal, hyperplastic polyp  . Appendectomy    . Vesicovaginal fistula closure w/ tah    . Rotator cuff repair      Bilaterally  . Revision total hip arthroplasty      Right   No family history on file. History  Substance Use Topics  . Smoking status: Not on file  . Smokeless tobacco: Not on file  . Alcohol  Use:    OB History   Grav Para Term Preterm Abortions TAB SAB Ect Mult Living                 Review of Systems  All other systems reviewed and are negative.    Allergies  Codeine; Morphine and related; Penicillins; and Sulfonamide derivatives  Home Medications   Current Outpatient Rx  Name  Route  Sig  Dispense  Refill  . ALPRAZolam (XANAX) 1 MG tablet   Oral   Take 1 mg by mouth at bedtime as needed.           [name] aspirin 81 MG tablet   Oral   Take 81 mg by mouth daily.           [name] BIOTIN PO   Oral   Take 1 capsule by mouth daily.         . Cholecalciferol (VITAMIN D3) 400 UNITS CAPS   Oral   Take by mouth.           . Cinnamon 500 MG TABS   Oral   Take by mouth.           . Cyanocobalamin (VITAMIN B 12 PO)   Oral   Take by mouth.           [name]  cyclobenzaprine (FLEXERIL) 10 MG tablet   Oral   Take 10 mg by mouth at bedtime.         [name] desvenlafaxine (PRISTIQ) 100 MG 24 hr tablet   Oral   Take 100 mg by mouth daily.           . fish oil-omega-3 fatty acids 1000 MG capsule   Oral   Take 2 g by mouth daily.           . Flaxseed, Linseed, (FLAX SEED OIL PO)   Oral   Take by mouth.           . furosemide (LASIX) 20 MG tablet   Oral   Take 20 mg by mouth daily.         . Melatonin 10 MG CAPS   Oral   Take 10 mg by mouth at bedtime.         . methylphenidate (RITALIN) 10 MG tablet   Oral   Take 10 mg by mouth 2 (two) times daily.           . Multiple Vitamin (MULTIVITAMIN) capsule   Oral   Take 1 capsule by mouth daily.           [name] omeprazole (PRILOSEC) 20 MG capsule   Oral   Take 20 mg by mouth daily.           [name] oxyCODONE-acetaminophen (PERCOCET/ROXICET) 5-325 MG per tablet   Oral   Take 2 tablets by mouth every 4 (four) hours as needed for severe pain.   20 tablet   0   . vitamin E 1000 UNIT capsule   Oral   Take 1,000 Units by mouth daily.            BP 108/65  Pulse 80  Temp(Src) 98.9 F (37.2 C)  (Oral)  Resp 16  SpO2 92% Physical Exam  Nursing note and vitals reviewed. Constitutional: She is oriented to person, place, and time. She appears well-developed and well-nourished.  HENT:  Head: Normocephalic and atraumatic.  Eyes: Conjunctivae and EOM are normal. Pupils are equal, round, and reactive to light.  Neck: Normal range of motion. Neck supple.  Cardiovascular: Normal rate and regular rhythm.  Exam reveals no gallop and no friction rub.   No murmur heard. Pulmonary/Chest: Effort normal and breath sounds normal. No respiratory distress. She has no wheezes. She has no rales. She exhibits no tenderness.  Abdominal: Soft. Bowel sounds are normal. She exhibits no distension and no mass. There is no tenderness. There is no rebound and no guarding.  Musculoskeletal: Normal range of motion. She exhibits no edema and no tenderness.  Left knee ROM and strength deferred  Neurological: She is alert and oriented to person, place, and time.  Skin: Skin is warm and dry.  Psychiatric: She has a normal mood and affect. Her behavior is normal. Judgment and thought content normal.    ED Course  Procedures (including critical care time) Labs Review Labs Reviewed  CBC WITH DIFFERENTIAL  BASIC METABOLIC PANEL   Imaging Review Ct Knee Left Wo Contrast  03/02/2013   CLINICAL DATA:  Status post fall; left knee pain. Tibial fracture noted on radiograph.  EXAM: CT OF THE LEFT KNEE WITHOUT CONTRAST  TECHNIQUE: Multidetector CT imaging was performed according to the standard protocol. Multiplanar CT image reconstructions were also generated.  COMPARISON:  None.  FINDINGS: There is a near vertical fracture extending through the lateral tibial plateau, with mild associated impaction.  There is focal depression at the posterior aspect of the lateral tibial plateau adjacent to the tibiofibular articulation, measuring 7 mm, compatible with a Schatzker type 2 fracture. There is slight comminution at the site  of depression, and mild depression is noted more anteriorly. However, the somewhat complex fracture line extends across the midportion of the medial tibial plateau and anterior aspect of the tibial spine, rather than through the anterior aspect of the lateral tibial plateau, with slight comminution at the tibial spine. As a result, the dominant fracture line would be considered incomplete.  The anterior cruciate ligament and posterior cruciate ligament appear grossly intact, though the anterior cruciate ligament appears to insert on a minimally displaced tibial spine fragment. The menisci are not well assessed, though no definite extrusion is seen. The medial collateral ligament and lateral collateral ligament complex are grossly unremarkable, though part of the lateral collateral ligament complex extends to the depressed tibial plateau fragment.  A large lipohemarthrosis is noted. No additional fractures are seen. The quadriceps and patellar tendons are unremarkable in appearance.  IMPRESSION: 1. Somewhat unusual Schatzker type 2 fracture involving both the medial and lateral tibial plateau, and the tibial spine. Focal depression at the posterior aspect of the lateral tibial plateau adjacent to the tibiofibular articulation, measuring 7 mm, with slight comminution at the site of depression, and mild depression more anteriorly. The somewhat complex fracture line extends across the midportion of the medial tibial plateau and anterior aspect of the tibial spine, rather than through the anterior aspect of the lateral tibial plateau, and demonstrates slight comminution at the tibial spine. The dominant fracture line appears incomplete. 2. Anterior cruciate ligament appears to insert on a minimally displaced tibial spine fragment. Menisci not well assessed. 3. Large lipohemarthrosis noted.   Electronically Signed   By: Roanna Raider M.D.   On: 03/02/2013 04:10   Dg Knee Complete 4 Views Left  03/02/2013   CLINICAL  DATA:  Status post fall down steps, with left knee pain.  EXAM: LEFT KNEE - COMPLETE 4+ VIEW  COMPARISON:  None.  FINDINGS: Vague lucencies are noted at both tibial plateaus, with apparent disruption of the articular surface on both sides and mild flattening of the lateral tibial plateau. This is suspicious for a Schatzker type 5 tibial plateau fracture involving both condyles, though the distal extension of the fracture lines is not well characterized.  A associated large lipohemarthrosis is seen. The fracture may involve the tibial spine, given the slightly unusual appearance along the tibial spine on the lateral view. The distal femur appears intact. The patella is grossly unremarkable in appearance. No fibular fracture is identified.  IMPRESSION: Disruption of the articular surface at both the medial and lateral tibial plateaus, with mild flattening of the lateral tibial plateau. This is suspicious for a Schatzker type 5 tibial plateau fracture involving both condyles, though the distal extension of the fracture lines is not well characterized. Associated large lipohemarthrosis seen. Possible additional fracture line involving the tibial spine.   Electronically Signed   By: Roanna Raider M.D.   On: 03/02/2013 02:22    EKG Interpretation   None       MDM   1. Tibial plateau fracture, left, closed, initial encounter     Discussed the patient with Dr. Freida Busman.  Will admit the patient as she is unable to ambulate.  Discussed the patient with [facility], who say that they only admit hip fractures for orthopedics.  They recommend admission by orthopedics, but  say if there is a problem then to have orthopedics call the hospitalist.  I discussed this with Dr. Eulah Pont, and gave him Dr. Chancy Milroy number as instructed.  7:04 PM Patient will be admitted by [facility].    Roxy Horseman, PA-C 03/02/13 1904

## 2013-03-02 NOTE — ED Provider Notes (Addendum)
CSN: [id number]     Arrival date & time 03/02/13  0045 History   First MD Initiated Contact with Patient 03/02/13 0114     Chief Complaint  Patient presents with  . Knee Injury   (Consider location/radiation/quality/duration/timing/severity/associated sxs/prior Treatment) HPI 57 year old female presents to emergency department from home via EMS after fall.  Ration reports she tumbled down to her 3 stairs landing on her knee.  She reports she struck her head, but did not pass out.  She has no head pain.  She has been unable to stand due to pain in the knee.  Patient has history of GERD, low back pain, obstructive sleep apnea, type 2 diabetes.  She is a smoker. Past Medical History  Diagnosis Date  . Allergic rhinitis   . Anemia     leukocytosis/thrombocytosis--Dr. Mariel Sleet  . Asthma   . Depression     ? Bipolar Disorder--Dr, Mayford Knife  . GERD (gastroesophageal reflux disease)     Barrett's esophagus  . Low back pain   . Seizure disorder     ? absence  . Macular degeneration, bilateral     Dry-Dr. Lita Mains  . Arthritis   . Hx of migraines   . Brain injury     Closed head  . OSA (obstructive sleep apnea)   . RLS (restless legs syndrome)   . NIDDM (non-insulin dependent diabetes mellitus)    Past Surgical History  Procedure Laterality Date  . Colonoscopy  12/08    TCS/EGD: + Barrett's,+ gastritis, friable anal canal, hyperplastic polyp  . Appendectomy    . Vesicovaginal fistula closure w/ tah    . Rotator cuff repair      Bilaterally  . Revision total hip arthroplasty      Right   No family history on file. History  Substance Use Topics  . Smoking status: Not on file  . Smokeless tobacco: Not on file  . Alcohol Use:    OB History   Grav Para Term Preterm Abortions TAB SAB Ect Mult Living                 Review of Systems  See History of Present Illness; otherwise all other systems are reviewed and negative  Allergies  Codeine; Morphine and related; Penicillins;  and Sulfonamide derivatives  Home Medications   Current Outpatient Rx  Name  Route  Sig  Dispense  Refill  . ALPRAZolam (XANAX) 1 MG tablet   Oral   Take 1 mg by mouth at bedtime as needed.           [name] aspirin 81 MG tablet   Oral   Take 81 mg by mouth daily.           [name] BIOTIN PO   Oral   Take 1 capsule by mouth daily.         . Cholecalciferol (VITAMIN D3) 400 UNITS CAPS   Oral   Take by mouth.           . Cinnamon 500 MG TABS   Oral   Take by mouth.           . Cyanocobalamin (VITAMIN B 12 PO)   Oral   Take by mouth.           . cyclobenzaprine (FLEXERIL) 10 MG tablet   Oral   Take 10 mg by mouth at bedtime.         [name] desvenlafaxine (PRISTIQ) 100 MG 24 hr tablet   Oral  Take 100 mg by mouth daily.           . fish oil-omega-3 fatty acids 1000 MG capsule   Oral   Take 2 g by mouth daily.           . Flaxseed, Linseed, (FLAX SEED OIL PO)   Oral   Take by mouth.           . furosemide (LASIX) 20 MG tablet   Oral   Take 20 mg by mouth daily.         . Melatonin 10 MG CAPS   Oral   Take 10 mg by mouth at bedtime.         . methylphenidate (RITALIN) 10 MG tablet   Oral   Take 10 mg by mouth 2 (two) times daily.           . Multiple Vitamin (MULTIVITAMIN) capsule   Oral   Take 1 capsule by mouth daily.           [name] omeprazole (PRILOSEC) 20 MG capsule   Oral   Take 20 mg by mouth daily.           . vitamin E 1000 UNIT capsule   Oral   Take 1,000 Units by mouth daily.            BP 95/80  Pulse 84  Temp(Src) 98.4 F (36.9 C) (Oral)  Resp 18  SpO2 92% Physical Exam  Nursing note and vitals reviewed. Constitutional: She is oriented to person, place, and time. She appears well-developed and well-nourished. She appears distressed (uncomfortable appearing).  HENT:  Head: Normocephalic and atraumatic.  Nose: Nose normal.  Mouth/Throat: Oropharynx is clear and moist.  Eyes: Conjunctivae and EOM are normal. Pupils  are equal, round, and reactive to light.  Neck: Normal range of motion. Neck supple. No JVD present. No tracheal deviation present. No thyromegaly present.  Cardiovascular: Normal rate, regular rhythm, normal heart sounds and intact distal pulses.  Exam reveals no gallop and no friction rub.   No murmur heard. Pulmonary/Chest: Effort normal and breath sounds normal. No stridor. No respiratory distress. She has no wheezes. She has no rales. She exhibits no tenderness.  Abdominal: Soft. Bowel sounds are normal. She exhibits no distension and no mass. There is no tenderness. There is no rebound and no guarding.  Musculoskeletal: Normal range of motion. She exhibits edema and tenderness.  Patient with tenderness to palpation of left knee, effusion noted to lateral superior aspect of patella.  Patient is unable to range knee, secondary to pain.  Distally compartments are soft, pulses intact.  Range of motion at ankle and toes normal.  Normal sensation.  Lymphadenopathy:    She has no cervical adenopathy.  Neurological: She is alert and oriented to person, place, and time. She exhibits normal muscle tone. Coordination normal.  Skin: Skin is warm and dry. No rash noted. No erythema. No pallor.  Psychiatric: She has a normal mood and affect. Her behavior is normal. Judgment and thought content normal.    ED Course  Procedures (including critical care time) Labs Review Labs Reviewed - No data to display Imaging Review Ct Knee Left Wo Contrast  03/02/2013   CLINICAL DATA:  Status post fall; left knee pain. Tibial fracture noted on radiograph.  EXAM: CT OF THE LEFT KNEE WITHOUT CONTRAST  TECHNIQUE: Multidetector CT imaging was performed according to the standard protocol. Multiplanar CT image reconstructions were also generated.  COMPARISON:  None.  FINDINGS: There is a near vertical fracture extending through the lateral tibial plateau, with mild associated impaction. There is focal depression at the  posterior aspect of the lateral tibial plateau adjacent to the tibiofibular articulation, measuring 7 mm, compatible with a Schatzker type 2 fracture. There is slight comminution at the site of depression, and mild depression is noted more anteriorly. However, the somewhat complex fracture line extends across the midportion of the medial tibial plateau and anterior aspect of the tibial spine, rather than through the anterior aspect of the lateral tibial plateau, with slight comminution at the tibial spine. As a result, the dominant fracture line would be considered incomplete.  The anterior cruciate ligament and posterior cruciate ligament appear grossly intact, though the anterior cruciate ligament appears to insert on a minimally displaced tibial spine fragment. The menisci are not well assessed, though no definite extrusion is seen. The medial collateral ligament and lateral collateral ligament complex are grossly unremarkable, though part of the lateral collateral ligament complex extends to the depressed tibial plateau fragment.  A large lipohemarthrosis is noted. No additional fractures are seen. The quadriceps and patellar tendons are unremarkable in appearance.  IMPRESSION: 1. Somewhat unusual Schatzker type 2 fracture involving both the medial and lateral tibial plateau, and the tibial spine. Focal depression at the posterior aspect of the lateral tibial plateau adjacent to the tibiofibular articulation, measuring 7 mm, with slight comminution at the site of depression, and mild depression more anteriorly. The somewhat complex fracture line extends across the midportion of the medial tibial plateau and anterior aspect of the tibial spine, rather than through the anterior aspect of the lateral tibial plateau, and demonstrates slight comminution at the tibial spine. The dominant fracture line appears incomplete. 2. Anterior cruciate ligament appears to insert on a minimally displaced tibial spine fragment.  Menisci not well assessed. 3. Large lipohemarthrosis noted.   Electronically Signed   By: Roanna Raider M.D.   On: 03/02/2013 04:10   Dg Knee Complete 4 Views Left  03/02/2013   CLINICAL DATA:  Status post fall down steps, with left knee pain.  EXAM: LEFT KNEE - COMPLETE 4+ VIEW  COMPARISON:  None.  FINDINGS: Vague lucencies are noted at both tibial plateaus, with apparent disruption of the articular surface on both sides and mild flattening of the lateral tibial plateau. This is suspicious for a Schatzker type 5 tibial plateau fracture involving both condyles, though the distal extension of the fracture lines is not well characterized.  A associated large lipohemarthrosis is seen. The fracture may involve the tibial spine, given the slightly unusual appearance along the tibial spine on the lateral view. The distal femur appears intact. The patella is grossly unremarkable in appearance. No fibular fracture is identified.  IMPRESSION: Disruption of the articular surface at both the medial and lateral tibial plateaus, with mild flattening of the lateral tibial plateau. This is suspicious for a Schatzker type 5 tibial plateau fracture involving both condyles, though the distal extension of the fracture lines is not well characterized. Associated large lipohemarthrosis seen. Possible additional fracture line involving the tibial spine.   Electronically Signed   By: Roanna Raider M.D.   On: 03/02/2013 02:22    EKG Interpretation   None       MDM   1. Tibial plateau fracture, left, closed, initial encounter    57 year old female with left knee injury.  X-rays show Schatzker type V tibial plateau fracture involving both condyles.  Discussed with on-call orthopedics, Dr.  Eulah Pont.  He recommends knee mobilizer crutches, elevation, and followup with him on Monday.  Patient updated on findings and plan.   Olivia Mackie, MD 03/02/13 0435  5:37 AM She'll with significant difficulty using crutches.   Patient return to the emergency room when she was unable to get into a [place] her family.  Patient adamant that she will not be admitted.  Patient was observed to ambulate with crutches 6 feet.  She has difficulties with range of motion of her right hip due to prior surgery.  I have placed a consult to home health care to contact her for evaluation given her limitations.  Olivia Mackie, MD 03/02/13 [phone]

## 2013-03-02 NOTE — Consult Note (Signed)
ORTHOPAEDIC CONSULTATION  REQUESTING PHYSICIAN: Acey Lav, MD  Chief Complaint: L plateau fracture  HPI: Adrienne [name] is a 57 y.o. female who complains of  Fall down a few steps yesterday. She was released home for Tue OR but could not mobilize and returned to ED  Past Medical History  Diagnosis Date  . Allergic rhinitis   . Anemia     leukocytosis/thrombocytosis--Dr. Mariel Sleet  . Asthma   . Depression     ? Bipolar Disorder--Dr, Mayford Knife  . GERD (gastroesophageal reflux disease)     Barrett's esophagus  . Low back pain   . Seizure disorder     ? absence  . Macular degeneration, bilateral     Dry-Dr. Lita Mains  . Arthritis   . Hx of migraines   . Brain injury     Closed head  . OSA (obstructive sleep apnea)   . RLS (restless legs syndrome)   . NIDDM (non-insulin dependent diabetes mellitus)   . Status post right hip replacement   . Status post rotator cuff surgery    Past Surgical History  Procedure Laterality Date  . Colonoscopy  12/08    TCS/EGD: + Barrett's,+ gastritis, friable anal canal, hyperplastic polyp  . Appendectomy    . Vesicovaginal fistula closure w/ tah    . Rotator cuff repair      Bilaterally  . Revision total hip arthroplasty      Right  . Tonsillectomy     History   Social History  . Marital Status: Divorced    Spouse Name: N/A    Number of Children: 3  . Years of Education: 14   Occupational History  . on disability     due to head injury, 2005   Social History Main Topics  . Smoking status: Former Smoker -- 1.00 packs/day    Types: Cigarettes    Start date: 03/03/1971    Quit date: 12/01/2012  . Smokeless tobacco: None  . Alcohol Use: No  . Drug Use: No  . Sexual Activity: Yes   Other Topics Concern  . None   Social History Narrative  . None   Family History  Problem Relation Age of Onset  . Atrial fibrillation Mother   . Diabetes Maternal Grandfather   . Cancer Mother   . Cancer Maternal Grandfather   .  Thyroid disease Son    Allergies  Allergen Reactions  . Codeine     REACTION: headaches  . Morphine And Related Itching  . Penicillins Rash  . Sulfonamide Derivatives Rash   Prior to Admission medications   Medication Sig Start Date End Date Taking? Authorizing Provider  ALPRAZolam Prudy Feeler) 1 MG tablet Take 1 mg by mouth at bedtime as needed.     Yes Historical Provider, MD  aspirin 81 MG tablet Take 81 mg by mouth daily.     Yes Historical Provider, MD  BIOTIN PO Take 1 capsule by mouth daily.   Yes Historical Provider, MD  Cholecalciferol (VITAMIN D3) 400 UNITS CAPS Take by mouth.     Yes Historical Provider, MD  Cinnamon 500 MG TABS Take by mouth.     Yes Historical Provider, MD  Cyanocobalamin (VITAMIN B 12 PO) Take by mouth.     Yes Historical Provider, MD  cyclobenzaprine (FLEXERIL) 10 MG tablet Take 10 mg by mouth at bedtime.   Yes Historical Provider, MD  desvenlafaxine (PRISTIQ) 100 MG 24 hr tablet Take 100 mg by mouth daily.  Yes Historical Provider, MD  fish oil-omega-3 fatty acids 1000 MG capsule Take 2 g by mouth daily.     Yes Historical Provider, MD  Flaxseed, Linseed, (FLAX SEED OIL PO) Take by mouth.     Yes Historical Provider, MD  furosemide (LASIX) 20 MG tablet Take 20 mg by mouth daily.   Yes Historical Provider, MD  Melatonin 10 MG CAPS Take 10 mg by mouth at bedtime.   Yes Historical Provider, MD  methylphenidate (RITALIN) 10 MG tablet Take 10 mg by mouth 2 (two) times daily.     Yes Historical Provider, MD  Multiple Vitamin (MULTIVITAMIN) capsule Take 1 capsule by mouth daily.     Yes Historical Provider, MD  omeprazole (PRILOSEC) 20 MG capsule Take 20 mg by mouth daily.     Yes Historical Provider, MD  oxyCODONE-acetaminophen (PERCOCET/ROXICET) 5-325 MG per tablet Take 2 tablets by mouth every 4 (four) hours as needed for severe pain. 03/02/13  Yes Olivia Mackie, MD  vitamin E 1000 UNIT capsule Take 1,000 Units by mouth daily.     Yes Historical Provider, MD    Dg Hip Complete Right  03/02/2013   CLINICAL DATA:  Fall. Right hip pain. Hip arthroplasty in near 2010.  EXAM: RIGHT HIP - COMPLETE 2+ VIEW  COMPARISON:  12/16/2007 radiographs.  FINDINGS: Uncomplicated right total hip arthroplasty. There is mild lateral migration of the distal femoral stem. No periprosthetic fracture is present. Acetabular cup appears within normal limits. The obturator rings are intact. Moderate to severe left hip osteoarthritis is incidentally noted. L4-L5 right-sided spondylosis.  IMPRESSION: Uncomplicated right total hip arthroplasty. No acute osseous abnormality.   Electronically Signed   By: Andreas Newport M.D.   On: 03/02/2013 19:18   Ct Knee Left Wo Contrast  03/02/2013   CLINICAL DATA:  Status post fall; left knee pain. Tibial fracture noted on radiograph.  EXAM: CT OF THE LEFT KNEE WITHOUT CONTRAST  TECHNIQUE: Multidetector CT imaging was performed according to the standard protocol. Multiplanar CT image reconstructions were also generated.  COMPARISON:  None.  FINDINGS: There is a near vertical fracture extending through the lateral tibial plateau, with mild associated impaction. There is focal depression at the posterior aspect of the lateral tibial plateau adjacent to the tibiofibular articulation, measuring 7 mm, compatible with a Schatzker type 2 fracture. There is slight comminution at the site of depression, and mild depression is noted more anteriorly. However, the somewhat complex fracture line extends across the midportion of the medial tibial plateau and anterior aspect of the tibial spine, rather than through the anterior aspect of the lateral tibial plateau, with slight comminution at the tibial spine. As a result, the dominant fracture line would be considered incomplete.  The anterior cruciate ligament and posterior cruciate ligament appear grossly intact, though the anterior cruciate ligament appears to insert on a minimally displaced tibial spine fragment. The  menisci are not well assessed, though no definite extrusion is seen. The medial collateral ligament and lateral collateral ligament complex are grossly unremarkable, though part of the lateral collateral ligament complex extends to the depressed tibial plateau fragment.  A large lipohemarthrosis is noted. No additional fractures are seen. The quadriceps and patellar tendons are unremarkable in appearance.  IMPRESSION: 1. Somewhat unusual Schatzker type 2 fracture involving both the medial and lateral tibial plateau, and the tibial spine. Focal depression at the posterior aspect of the lateral tibial plateau adjacent to the tibiofibular articulation, measuring 7 mm, with slight comminution at the  site of depression, and mild depression more anteriorly. The somewhat complex fracture line extends across the midportion of the medial tibial plateau and anterior aspect of the tibial spine, rather than through the anterior aspect of the lateral tibial plateau, and demonstrates slight comminution at the tibial spine. The dominant fracture line appears incomplete. 2. Anterior cruciate ligament appears to insert on a minimally displaced tibial spine fragment. Menisci not well assessed. 3. Large lipohemarthrosis noted.   Electronically Signed   By: Roanna Raider M.D.   On: 03/02/2013 04:10   Dg Knee Complete 4 Views Left  03/02/2013   CLINICAL DATA:  Status post fall down steps, with left knee pain.  EXAM: LEFT KNEE - COMPLETE 4+ VIEW  COMPARISON:  None.  FINDINGS: Vague lucencies are noted at both tibial plateaus, with apparent disruption of the articular surface on both sides and mild flattening of the lateral tibial plateau. This is suspicious for a Schatzker type 5 tibial plateau fracture involving both condyles, though the distal extension of the fracture lines is not well characterized.  A associated large lipohemarthrosis is seen. The fracture may involve the tibial spine, given the slightly unusual appearance  along the tibial spine on the lateral view. The distal femur appears intact. The patella is grossly unremarkable in appearance. No fibular fracture is identified.  IMPRESSION: Disruption of the articular surface at both the medial and lateral tibial plateaus, with mild flattening of the lateral tibial plateau. This is suspicious for a Schatzker type 5 tibial plateau fracture involving both condyles, though the distal extension of the fracture lines is not well characterized. Associated large lipohemarthrosis seen. Possible additional fracture line involving the tibial spine.   Electronically Signed   By: Roanna Raider M.D.   On: 03/02/2013 02:22    Positive ROS: All other systems have been reviewed and were otherwise negative with the exception of those mentioned in the HPI and as above.  Labs cbc  Recent Labs  03/02/13 1550  WBC 14.2*  HGB 15.1*  HCT 45.1  PLT 249    Labs inflam No results found for this basename: ESR, CRP,  in the last 72 hours  Labs coag No results found for this basename: INR, PT, PTT,  in the last 72 hours   Recent Labs  03/02/13 1550  NA 135  K 4.0  CL 97  CO2 21  GLUCOSE 216*  BUN 13  CREATININE 0.64  CALCIUM 9.0    Physical Exam: Filed Vitals:   03/02/13 1847  BP:   Pulse: 87  Temp:   Resp:    General: Alert, no acute distress Cardiovascular: No pedal edema Respiratory: No cyanosis, no use of accessory musculature GI: No organomegaly, abdomen is soft and non-tender Skin: No lesions in the area of chief complaint Neurologic: Sensation intact distally Psychiatric: Patient is competent for consent with normal mood and affect Lymphatic: No axillary or cervical lymphadenopathy  MUSCULOSKELETAL:  Swelling and TTP at plateau, compartments soft, NVI distally Other extremities are atraumatic with painless ROM and NVI.  Assessment: L plateau fx  Plan: ORIF on 12/30 Weight Bearing Status: NWB Elevate  PT VTE px: SCD's and hold chemical  the morning of 12/30   Margarita Rana, D, MD Cell [phone]   03/02/2013 8:24 PM

## 2013-03-02 NOTE — H&P (Signed)
[facility] History and Physical  Adrienne [name] ZOX:[id number] DOB: August 30, 1955 DOA: 03/02/2013  Referring physician: none PCP: No PCP Per Patient *pt is welcome to f/u with me in my outpatient practice in [place] (where she lives)* Specialists: Dr. Eulah Pont - ortho for this admission  Chief Complaint: pain secondary to left tibial fracture  HPI: Adrienne [name] is a 57 y.o. female  Last night at about 11pm pt was walking down stairs in her home and she missed a step. She fell down 3 steps and dropped the 3 parcels she was carrying. She did not hear or feel anysnaps or pops but felt immediate 10/10 pain in her left knee. She sat on the floor for 20 minutes, hoping it would feel better but it did not. She pulled herself up by the railing and was unable to bear weight. She slid to a phone about 2 feet away and called her mother and sister. They arrived in 10 minutes and called EMS. She was transported to [facility] but declined admission due to wanting to spend the holidays with family. She was home for about 10 hours before returning to the ED for admission due to inability to manage mobility and pain at home.   Review of Systems: The patient denies anorexia, fever, weight loss,, vision loss, decreased hearing, hoarseness, chest pain, syncope, dyspnea on exertion, peripheral edema, balance deficits, hemoptysis, abdominal pain, melena, hematochezia, severe indigestion/heartburn, hematuria, incontinence, genital sores, muscle weakness, suspicious skin lesions, transient blindness, difficulty walking, depression, unusual weight change, abnormal bleeding, enlarged lymph nodes, angioedema, and breast masses.  C/o 4/10 pain in lle, esp at the knee at this time.   Past Medical History  Diagnosis Date  . Allergic rhinitis   . Anemia     leukocytosis/thrombocytosis--Dr. Mariel Sleet  . Asthma   . Depression     ? Bipolar Disorder--Dr, Mayford Knife  . GERD (gastroesophageal reflux disease)     Barrett's  esophagus  . Low back pain   . Seizure disorder     ? absence  . Macular degeneration, bilateral     Dry-Dr. Lita Mains  . Arthritis   . Hx of migraines   . Brain injury     Closed head  . OSA (obstructive sleep apnea)   . RLS (restless legs syndrome)   . NIDDM (non-insulin dependent diabetes mellitus)    Past Surgical History  Procedure Laterality Date  . Colonoscopy  12/08    TCS/EGD: + Barrett's,+ gastritis, friable anal canal, hyperplastic polyp  . Appendectomy    . Vesicovaginal fistula closure w/ tah    . Rotator cuff repair      Bilaterally  . Revision total hip arthroplasty      Right   Social History:  Quit smoking 4 mos ago after 1ppd x 40 years Non drinker, no illicit drug use Lives at home, when not having broken leg, able to do all ADLs  Allergies  Allergen Reactions  . Codeine     REACTION: headaches  . Morphine And Related Itching  . Penicillins Rash  . Sulfonamide Derivatives Rash   Family History  Problem Relation Age of Onset  . Atrial fibrillation Mother   . Diabetes Maternal Grandfather   . Cancer Mother   . Cancer Maternal Grandfather   . Thyroid disease Son      Prior to Admission medications   Medication Sig Start Date End Date Taking? Authorizing Provider  ALPRAZolam Prudy Feeler) 1 MG tablet Take 1 mg by mouth at bedtime as  needed.     Yes Historical Provider, MD  aspirin 81 MG tablet Take 81 mg by mouth daily.     Yes Historical Provider, MD  BIOTIN PO Take 1 capsule by mouth daily.   Yes Historical Provider, MD  Cholecalciferol (VITAMIN D3) 400 UNITS CAPS Take by mouth.     Yes Historical Provider, MD  Cinnamon 500 MG TABS Take by mouth.     Yes Historical Provider, MD  Cyanocobalamin (VITAMIN B 12 PO) Take by mouth.     Yes Historical Provider, MD  cyclobenzaprine (FLEXERIL) 10 MG tablet Take 10 mg by mouth at bedtime.   Yes Historical Provider, MD  desvenlafaxine (PRISTIQ) 100 MG 24 hr tablet Take 100 mg by mouth daily.     Yes Historical  Provider, MD  fish oil-omega-3 fatty acids 1000 MG capsule Take 2 g by mouth daily.     Yes Historical Provider, MD  Flaxseed, Linseed, (FLAX SEED OIL PO) Take by mouth.     Yes Historical Provider, MD  furosemide (LASIX) 20 MG tablet Take 20 mg by mouth daily.   Yes Historical Provider, MD  Melatonin 10 MG CAPS Take 10 mg by mouth at bedtime.   Yes Historical Provider, MD  methylphenidate (RITALIN) 10 MG tablet Take 10 mg by mouth 2 (two) times daily.     Yes Historical Provider, MD  Multiple Vitamin (MULTIVITAMIN) capsule Take 1 capsule by mouth daily.     Yes Historical Provider, MD  omeprazole (PRILOSEC) 20 MG capsule Take 20 mg by mouth daily.     Yes Historical Provider, MD  oxyCODONE-acetaminophen (PERCOCET/ROXICET) 5-325 MG per tablet Take 2 tablets by mouth every 4 (four) hours as needed for severe pain. 03/02/13  Yes Olivia Mackie, MD  vitamin E 1000 UNIT capsule Take 1,000 Units by mouth daily.     Yes Historical Provider, MD   Physical Exam: Filed Vitals:   03/02/13 1847  BP:   Pulse: 87  Temp:   Resp:    Nursing note and vitals reviewed. Constitutional: She is oriented to person, place, and time. She appears well-developed and well-nourished.  HENT:  Right Ear: External ear normal.  Left Ear: External ear normal.  Nose: Nose normal.  Mouth/Throat: Oropharynx is clear and moist. No oropharyngeal exudate.  Eyes: Conjunctivae are normal. Pupils are equal, round, and reactive to light.  Neck: Normal range of motion. Neck supple. No thyromegaly present.  Cardiovascular: Normal rate, regular rhythm and normal heart sounds.   Pulmonary/Chest: Effort normal and breath sounds normal.  Abdominal: Soft. Bowel sounds are normal. She exhibits no distension. There is no tenderness. There is no rebound.  Lymphadenopathy:    She has no cervical adenopathy.  Neurological: She is alert and oriented to person, place, and time. She has normal reflexes.  Skin: Skin is warm and dry.   Psychiatric: She has a normal mood and affect. Her behavior is normal.  Musculoskeletal - lle - very ttp at the knee and moderatley ttp below, pulses intact with CP< 2 sec. Neurovascularly intact.  Right lateral upper thigh - ttp and firm, consist with hematoma  Labs on Admission:  Basic Metabolic Panel:  Recent Labs Lab 03/02/13 1550  NA 135  K 4.0  CL 97  CO2 21  GLUCOSE 216*  BUN 13  CREATININE 0.64  CALCIUM 9.0   Liver Function Tests: No results found for this basename: AST, ALT, ALKPHOS, BILITOT, PROT, ALBUMIN,  in the last 168 hours No results found  for this basename: LIPASE, AMYLASE,  in the last 168 hours No results found for this basename: AMMONIA,  in the last 168 hours CBC:  Recent Labs Lab 03/02/13 1550  WBC 14.2*  NEUTROABS 9.3*  HGB 15.1*  HCT 45.1  MCV 92.6  PLT 249   Cardiac Enzymes: No results found for this basename: CKTOTAL, CKMB, CKMBINDEX, TROPONINI,  in the last 168 hours  BNP (last 3 results) No results found for this basename: PROBNP,  in the last 8760 hours CBG: No results found for this basename: GLUCAP,  in the last 168 hours  Radiological Exams on Admission: Ct Knee Left Wo Contrast  03/02/2013   CLINICAL DATA:  Status post fall; left knee pain. Tibial fracture noted on radiograph.  EXAM: CT OF THE LEFT KNEE WITHOUT CONTRAST  TECHNIQUE: Multidetector CT imaging was performed according to the standard protocol. Multiplanar CT image reconstructions were also generated.  COMPARISON:  None.  FINDINGS: There is a near vertical fracture extending through the lateral tibial plateau, with mild associated impaction. There is focal depression at the posterior aspect of the lateral tibial plateau adjacent to the tibiofibular articulation, measuring 7 mm, compatible with a Schatzker type 2 fracture. There is slight comminution at the site of depression, and mild depression is noted more anteriorly. However, the somewhat complex fracture line extends  across the midportion of the medial tibial plateau and anterior aspect of the tibial spine, rather than through the anterior aspect of the lateral tibial plateau, with slight comminution at the tibial spine. As a result, the dominant fracture line would be considered incomplete.  The anterior cruciate ligament and posterior cruciate ligament appear grossly intact, though the anterior cruciate ligament appears to insert on a minimally displaced tibial spine fragment. The menisci are not well assessed, though no definite extrusion is seen. The medial collateral ligament and lateral collateral ligament complex are grossly unremarkable, though part of the lateral collateral ligament complex extends to the depressed tibial plateau fragment.  A large lipohemarthrosis is noted. No additional fractures are seen. The quadriceps and patellar tendons are unremarkable in appearance.  IMPRESSION: 1. Somewhat unusual Schatzker type 2 fracture involving both the medial and lateral tibial plateau, and the tibial spine. Focal depression at the posterior aspect of the lateral tibial plateau adjacent to the tibiofibular articulation, measuring 7 mm, with slight comminution at the site of depression, and mild depression more anteriorly. The somewhat complex fracture line extends across the midportion of the medial tibial plateau and anterior aspect of the tibial spine, rather than through the anterior aspect of the lateral tibial plateau, and demonstrates slight comminution at the tibial spine. The dominant fracture line appears incomplete. 2. Anterior cruciate ligament appears to insert on a minimally displaced tibial spine fragment. Menisci not well assessed. 3. Large lipohemarthrosis noted.   Electronically Signed   By: Roanna Raider M.D.   On: 03/02/2013 04:10   Dg Knee Complete 4 Views Left  03/02/2013   CLINICAL DATA:  Status post fall down steps, with left knee pain.  EXAM: LEFT KNEE - COMPLETE 4+ VIEW  COMPARISON:  None.   FINDINGS: Vague lucencies are noted at both tibial plateaus, with apparent disruption of the articular surface on both sides and mild flattening of the lateral tibial plateau. This is suspicious for a Schatzker type 5 tibial plateau fracture involving both condyles, though the distal extension of the fracture lines is not well characterized.  A associated large lipohemarthrosis is seen. The fracture may involve  the tibial spine, given the slightly unusual appearance along the tibial spine on the lateral view. The distal femur appears intact. The patella is grossly unremarkable in appearance. No fibular fracture is identified.  IMPRESSION: Disruption of the articular surface at both the medial and lateral tibial plateaus, with mild flattening of the lateral tibial plateau. This is suspicious for a Schatzker type 5 tibial plateau fracture involving both condyles, though the distal extension of the fracture lines is not well characterized. Associated large lipohemarthrosis seen. Possible additional fracture line involving the tibial spine.   Electronically Signed   By: Roanna Raider M.D.   On: 03/02/2013 02:22    EKG: Independently reviewed. NSR  Assessment/Plan  Patient Active Problem List   Diagnosis Date Noted  . Left tibial fracture 03/02/2013  . Fracture of left tibial plateau 03/02/2013  . Hematoma 03/02/2013  . Barrett's esophagus 04/03/2010  . SHOULDER, ARTHRITIS, DEGEN./OSTEO 03/06/2009  . RUPTURE ROTATOR CUFF 03/06/2009  . SHOULDER PAIN 05/02/2008  . NECK PAIN 05/02/2008  . IMPINGEMENT SYNDROME 05/02/2008  . DIABETES MELLITUS, TYPE II, UNCONTROLLED 12/02/2007  . H N P-LUMBAR 06/29/2007  . RESTLESS LEG SYNDROME 05/18/2007  . SLEEP APNEA 05/18/2007  . Headache(784.0) 05/18/2007  . HIP, ARTHRITIS, DEGEN./OSTEO 05/16/2007  . SCIATICA 03/31/2007  . BACK PAIN 03/31/2007  . ANEMIA-NOS 03/28/2007  . ANXIETY 03/28/2007  . DEPRESSION 03/28/2007  . MACULAR DEGENERATION 03/28/2007  .  ALLERGIC RHINITIS 03/28/2007  . ASTHMA 03/28/2007  . GERD 03/28/2007  . ARTHRITIS 03/28/2007  . LOW BACK PAIN 03/28/2007  . SEIZURE DISORDER 03/28/2007  . FATIGUE 03/28/2007  . WEIGHT GAIN, ABNORMAL 03/28/2007  . HEAD TRAUMA, CLOSED 03/28/2007    1. Left tibial fracture: This patient will be transferred and admitted at [facility]. Dr. Eulah Pont will be seeing her tomorrow (this is my understanding). Surgery is planned for early next week. We'll place her on PCA with morphine to control her pain. We'll give her Benadryl to help with the itching.  scratch that 2. Diabetes mellitus type 2, uncontrolled, new onset: The patient was unaware that she was diabetic however her sugar was 216 on her BMP today. Will check an A1c and ask diabetes education to see her while she is in the hospital. Will put her on a Karp control diet and check her blood sugars. We'll hold off on starting sliding scale until we see what her blood sugars are doing. 3. Anxiety: We'll continue her Xanax. Advised the patient that benzodiazepines and narcotics can make her more sleepy and if she feels like she is too tired she can decline in her Xanax. 4. Asthma: Patient says she is asymptomatic at this time. Her lungs are clear on exam. Have ordered albuterol nebs when necessary. 5. Depression: Continue home meds 6. 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 The patient says she has not had any seizures for over 5 years. She also says the depression is not a problem for her anymore and denies suicidal ideation or homicidal ideation.  Dr. Eulah Pont - orthopedics, has been formally consulted  Code Status:FULL Family Communication: none Disposition Plan: surgery planned for early next week. Inpatient until then  Time spent: 1 hour  Acey Lav [facility] Pager [phone]  If 7PM-7AM, please contact  night-coverage [email or web address] Password [email or web address] 03/02/2013, 7:01 PM

## 2013-03-02 NOTE — ED Notes (Signed)
Report given to Adrienne [name]

## 2013-03-02 NOTE — ED Provider Notes (Signed)
EKG Interpretation   None        Toy Baker, MD 03/02/13 [phone]

## 2013-03-03 ENCOUNTER — Encounter (HOSPITAL_COMMUNITY): Payer: Self-pay | Admitting: Emergency Medicine

## 2013-03-03 DIAGNOSIS — F411 Generalized anxiety disorder: Secondary | ICD-10-CM

## 2013-03-03 LAB — GLUCOSE, CAPILLARY
Glucose-Capillary: 137 mg/dL — ABNORMAL HIGH (ref 70–99)
Glucose-Capillary: 228 mg/dL — ABNORMAL HIGH (ref 70–99)
Glucose-Capillary: 156 mg/dL — ABNORMAL HIGH (ref 70–99)
Glucose-Capillary: 131 mg/dL — ABNORMAL HIGH (ref 70–99)
Glucose-Capillary: 144 mg/dL — ABNORMAL HIGH (ref 70–99)
Glucose-Capillary: 144 mg/dL — ABNORMAL HIGH (ref 70–99)

## 2013-03-03 LAB — COMPREHENSIVE METABOLIC PANEL
AST: 29 U/L (ref 0–37)
Albumin: 2.9 g/dL — ABNORMAL LOW (ref 3.5–5.2)
Chloride: 103 mEq/L (ref 96–112)
Creatinine, Ser: 0.64 mg/dL (ref 0.50–1.10)
Potassium: 3.7 mEq/L (ref 3.5–5.1)
Total Bilirubin: 0.5 mg/dL (ref 0.3–1.2)
Total Protein: 6 g/dL (ref 6.0–8.3)

## 2013-03-03 LAB — CBC
HCT: 41.3 % (ref 36.0–46.0)
MCH: 31.1 pg (ref 26.0–34.0)
MCHC: 33.2 g/dL (ref 30.0–36.0)
MCV: 93.7 fL (ref 78.0–100.0)
Platelets: 230 10*3/uL (ref 150–400)
RBC: 4.41 MIL/uL (ref 3.87–5.11)

## 2013-03-03 LAB — HEMOGLOBIN A1C
Hgb A1c MFr Bld: 7.5 % — ABNORMAL HIGH (ref <5.7)
Mean Plasma Glucose: 169 mg/dL — ABNORMAL HIGH (ref <117)

## 2013-03-03 MED ORDER — LIVING WELL WITH DIABETES BOOK
Freq: Once | Status: AC
  Administered 2013-03-03: 12:00:00
  Filled 2013-03-03: qty 1

## 2013-03-03 MED ORDER — SODIUM CHLORIDE 0.9 % IV SOLN
INTRAVENOUS | Status: DC
  Administered 2013-03-03: 21:00:00 via INTRAVENOUS
  Administered 2013-03-03: 100 mL/h via INTRAVENOUS
  Administered 2013-03-04 – 2013-03-06 (×4): via INTRAVENOUS
  Administered 2013-03-06: 100 mL/h via INTRAVENOUS
  Administered 2013-03-06 – 2013-03-09 (×8): via INTRAVENOUS

## 2013-03-03 MED ORDER — PNEUMOCOCCAL VAC POLYVALENT 25 MCG/0.5ML IJ INJ: 0.5000 mL | INJECTION | INTRAMUSCULAR | Status: DC

## 2013-03-03 MED ORDER — ENOXAPARIN SODIUM 40 MG/0.4ML ~~LOC~~ SOLN: 40.0000 mg | Freq: Every day | SUBCUTANEOUS | Status: DC

## 2013-03-03 MED ORDER — INFLUENZA VAC SPLIT QUAD 0.5 ML IM SUSP: 0.5000 mL | INTRAMUSCULAR | Status: DC

## 2013-03-03 MED ORDER — PNEUMOCOCCAL VAC POLYVALENT 25 MCG/0.5ML IJ INJ
0.5000 mL | INJECTION | INTRAMUSCULAR | Status: AC
  Administered 2013-03-04: 0.5 mL via INTRAMUSCULAR
  Filled 2013-03-03: qty 0.5

## 2013-03-03 MED ORDER — INFLUENZA VAC SPLIT QUAD 0.5 ML IM SUSP
0.5000 mL | INTRAMUSCULAR | Status: AC
  Administered 2013-03-04: 0.5 mL via INTRAMUSCULAR
  Filled 2013-03-03: qty 0.5

## 2013-03-03 MED ORDER — ENOXAPARIN SODIUM 40 MG/0.4ML ~~LOC~~ SOLN
40.0000 mg | Freq: Every day | SUBCUTANEOUS | Status: AC
  Administered 2013-03-03 – 2013-03-06 (×4): 40 mg via SUBCUTANEOUS
  Filled 2013-03-03 (×4): qty 0.4

## 2013-03-03 NOTE — Progress Notes (Signed)
[facility] PROGRESS NOTE  CHANIN FRUMKIN ZOX:[id number] DOB: [birth year] DOA: 03/02/2013 PCP: No PCP Per Patient  Assessment/Plan: 1-Left tibial fracture: Dr Eulah Pont planning surgery 12-30.  -Will start Lovenox for DVT prophylaxis, hold the day of surgery.  -Continue with PCA pump, monitor for over sedation.  -Docusate for Bowel regimen.   2-Diabetes mellitus type 2, uncontrolled, new onset: HB A-1c pending. Continue with SSI.   3-Anxiety: We'll continue her Xanax PRN.  Asthma:  albuterol nebs when necessary.   Depression: Continue home meds   History of closed head injury: Per the patient this was the source of her seizures and other new problems such as depression. The patient says she has not had any seizures for over 5 years.   Leukocytosis: probably reactive, trending down.   Code Status: Full Code.  Family Communication: Care discussed with patient.  Disposition Plan: remain inpatient.    Consultants:  Dr Eulah Pont.   Procedures:  none  Antibiotics:  none  HPI/Subjective: Complaining of itching and lower extremity pain.   Objective: Filed Vitals:   03/03/13 0800  BP:   Pulse:   Temp:   Resp: 11    Intake/Output Summary (Last 24 hours) at 03/03/13 0858 Last data filed at 03/03/13 0458  Gross per 24 hour  Intake 811.67 ml  Output      0 ml  Net 811.67 ml   There were no vitals filed for this visit.  Exam:   General:  No distress.   Cardiovascular: S 1, S 2 RRR  Respiratory: CTA  Abdomen: Bs present, soft, NT  Musculoskeletal: left knee LE with immobilizer.   Data Reviewed: Basic Metabolic Panel:  Recent Labs Lab 03/02/13 1550 03/02/13 2258 03/03/13 0530  NA 135  --  141  K 4.0  --  3.7  CL 97  --  103  CO2 21  --  28  GLUCOSE 216*  --  191*  BUN 13  --  12  CREATININE 0.64  --  0.64  CALCIUM 9.0  --  8.4  MG  --  2.0  --   PHOS  --  4.1  --    Liver Function Tests:  Recent Labs Lab 03/03/13 0530  AST 29  ALT 21   ALKPHOS 56  BILITOT 0.5  PROT 6.0  ALBUMIN 2.9*   No results found for this basename: LIPASE, AMYLASE,  in the last 168 hours No results found for this basename: AMMONIA,  in the last 168 hours CBC:  Recent Labs Lab 03/02/13 1550 03/03/13 0530  WBC 14.2* 10.6*  NEUTROABS 9.3*  --   HGB 15.1* 13.7  HCT 45.1 41.3  MCV 92.6 93.7  PLT 249 230   Cardiac Enzymes: No results found for this basename: CKTOTAL, CKMB, CKMBINDEX, TROPONINI,  in the last 168 hours BNP (last 3 results) No results found for this basename: PROBNP,  in the last 8760 hours CBG:  Recent Labs Lab 03/02/13 2241 03/03/13 0504 03/03/13 0815  GLUCAP 137* 228* 156*    Recent Results (from the past 240 hour(s))  SURGICAL PCR SCREEN     Status: None   Collection Time    03/03/13  1:23 AM      Result Value Range Status   MRSA, PCR NEGATIVE  NEGATIVE Final   Staphylococcus aureus NEGATIVE  NEGATIVE Final   Comment:            The Xpert SA Assay (FDA     approved for NASAL specimens  in patients over 72 years of age),     is one component of     a comprehensive surveillance     program.  Test performance has     been validated by [employer] for patients greater     than or equal to 46 year old.     It is not intended     to diagnose infection nor to     guide or monitor treatment.     Studies: Dg Hip Complete Right  03/02/2013   CLINICAL DATA:  Fall. Right hip pain. Hip arthroplasty in near 2010.  EXAM: RIGHT HIP - COMPLETE 2+ VIEW  COMPARISON:  12/16/2007 radiographs.  FINDINGS: Uncomplicated right total hip arthroplasty. There is mild lateral migration of the distal femoral stem. No periprosthetic fracture is present. Acetabular cup appears within normal limits. The obturator rings are intact. Moderate to severe left hip osteoarthritis is incidentally noted. L4-L5 right-sided spondylosis.  IMPRESSION: Uncomplicated right total hip arthroplasty. No acute osseous abnormality.   Electronically  Signed   By: Andreas Newport M.D.   On: 03/02/2013 19:18   Ct Knee Left Wo Contrast  03/02/2013   CLINICAL DATA:  Status post fall; left knee pain. Tibial fracture noted on radiograph.  EXAM: CT OF THE LEFT KNEE WITHOUT CONTRAST  TECHNIQUE: Multidetector CT imaging was performed according to the standard protocol. Multiplanar CT image reconstructions were also generated.  COMPARISON:  None.  FINDINGS: There is a near vertical fracture extending through the lateral tibial plateau, with mild associated impaction. There is focal depression at the posterior aspect of the lateral tibial plateau adjacent to the tibiofibular articulation, measuring 7 mm, compatible with a Schatzker type 2 fracture. There is slight comminution at the site of depression, and mild depression is noted more anteriorly. However, the somewhat complex fracture line extends across the midportion of the medial tibial plateau and anterior aspect of the tibial spine, rather than through the anterior aspect of the lateral tibial plateau, with slight comminution at the tibial spine. As a result, the dominant fracture line would be considered incomplete.  The anterior cruciate ligament and posterior cruciate ligament appear grossly intact, though the anterior cruciate ligament appears to insert on a minimally displaced tibial spine fragment. The menisci are not well assessed, though no definite extrusion is seen. The medial collateral ligament and lateral collateral ligament complex are grossly unremarkable, though part of the lateral collateral ligament complex extends to the depressed tibial plateau fragment.  A large lipohemarthrosis is noted. No additional fractures are seen. The quadriceps and patellar tendons are unremarkable in appearance.  IMPRESSION: 1. Somewhat unusual Schatzker type 2 fracture involving both the medial and lateral tibial plateau, and the tibial spine. Focal depression at the posterior aspect of the lateral tibial plateau  adjacent to the tibiofibular articulation, measuring 7 mm, with slight comminution at the site of depression, and mild depression more anteriorly. The somewhat complex fracture line extends across the midportion of the medial tibial plateau and anterior aspect of the tibial spine, rather than through the anterior aspect of the lateral tibial plateau, and demonstrates slight comminution at the tibial spine. The dominant fracture line appears incomplete. 2. Anterior cruciate ligament appears to insert on a minimally displaced tibial spine fragment. Menisci not well assessed. 3. Large lipohemarthrosis noted.   Electronically Signed   By: Roanna Raider M.D.   On: 03/02/2013 04:10   Dg Knee Complete 4 Views Left  03/02/2013  CLINICAL DATA:  Status post fall down steps, with left knee pain.  EXAM: LEFT KNEE - COMPLETE 4+ VIEW  COMPARISON:  None.  FINDINGS: Vague lucencies are noted at both tibial plateaus, with apparent disruption of the articular surface on both sides and mild flattening of the lateral tibial plateau. This is suspicious for a Schatzker type 5 tibial plateau fracture involving both condyles, though the distal extension of the fracture lines is not well characterized.  A associated large lipohemarthrosis is seen. The fracture may involve the tibial spine, given the slightly unusual appearance along the tibial spine on the lateral view. The distal femur appears intact. The patella is grossly unremarkable in appearance. No fibular fracture is identified.  IMPRESSION: Disruption of the articular surface at both the medial and lateral tibial plateaus, with mild flattening of the lateral tibial plateau. This is suspicious for a Schatzker type 5 tibial plateau fracture involving both condyles, though the distal extension of the fracture lines is not well characterized. Associated large lipohemarthrosis seen. Possible additional fracture line involving the tibial spine.   Electronically Signed   By: Roanna Raider M.D.   On: 03/02/2013 02:22    Scheduled Meds: . chlorhexidine  60 mL Topical Once  . clindamycin (CLEOCIN) IV  900 mg Intravenous On Call to OR  . docusate sodium  100 mg Oral BID  . furosemide  20 mg Oral Daily  . HYDROmorphone      . [START ON 03/04/2013] influenza vac split quadrivalent PF  0.5 mL Intramuscular Tomorrow-1000  . insulin aspart  0-15 Units Subcutaneous TID WC  . methylphenidate  10 mg Oral BID  . morphine   Intravenous Q4H  . pantoprazole  40 mg Oral Daily  . [START ON 03/04/2013] pneumococcal 23 valent vaccine  0.5 mL Intramuscular Tomorrow-1000  . sodium chloride  3 mL Intravenous Q12H  . venlafaxine XR  150 mg Oral Q breakfast   Continuous Infusions: . dextrose 5 % and 0.45% NaCl 100 mL/hr (03/03/13 0837)    Principal Problem:   Fracture of left tibial plateau Active Problems:   DIABETES MELLITUS, TYPE II, UNCONTROLLED   ANXIETY   DEPRESSION   RESTLESS LEG SYNDROME   ALLERGIC RHINITIS   ASTHMA   GERD   ARTHRITIS   SEIZURE DISORDER   SLEEP APNEA   Headache(784.0)   Barrett's esophagus   Left tibial fracture   Hematoma    Time spent: 35 minutes.     REGALADO,BELKYS  [facility] Pager [phone]. If 7PM-7AM, please contact night-coverage at [email or web address], password [facility] 03/03/2013, 8:58 AM  LOS: 1 day

## 2013-03-03 NOTE — Progress Notes (Signed)
Inpatient Diabetes Program Recommendations  AACE/ADA: New Consensus Statement on Inpatient Glycemic Control (2013)  Target Ranges:  Prepandial:   less than 140 mg/dL      Peak postprandial:   less than 180 mg/dL (1-2 hours)      Critically ill patients:  140 - 180 mg/dL     **Patient admitted with fracture.  New diagnosis of DM this admission.  A1c 7.5%.  Patient for surgery next Tuesday.  **Spoke with pt about new diagnosis.  Discussed A1C results with her and explained what an A1C is, basic pathophysiology of DM Type 2, basic home care, importance of checking CBGs and maintaining good CBG control to prevent long-term and short-term complications.  RNs to provide ongoing basic DM education at bedside with this patient.  Have ordered educational booklet and DM videos.  Care management consulted for assistance with patient finding PCP after d/c.  **RD consult placed for DM diet education.  **Have emailed Cathie Hoops, RD at [facility] hospital regarding this patient.  Patient can attend free DM classes at [facility] with Jenifer at [facility] after d/c.   **MD- Patient may do well with Metformin after d/c.  Please consider.    Will follow. Ambrose Finland RN, MSN, CDE Diabetes Coordinator Inpatient Diabetes Program Team Pager: [phone] (8a-10p)

## 2013-03-03 NOTE — Progress Notes (Signed)
PT Cancellation Note  Patient Details Name: Adrienne [name] MRN: [id number] DOB: 03/05/1956   Cancelled Treatment:     Pt currently has strict bedrest orders. Spoke with Dr. Sunnie Nielsen who stated to keep pt on bedrest, but to check with Dr Eulah Pont regarding activity. I have left voicemail for Dr Eulah Pont who is currently in surgery (per RN). If activity order is increased will attempt to mobilize pt this pm. Thanks   Donnella Sham 03/03/2013, 11:06 AM Lavona Mound, PT  [phone] 03/03/2013

## 2013-03-03 NOTE — Evaluation (Signed)
Physical Therapy Evaluation Patient Details Name: Adrienne [name] MRN: [id number] DOB: August 14, 1955 Today's Date: 03/03/2013 Time: 4098-1191 PT Time Calculation (min): 31 min  PT Assessment / Plan / Recommendation History of Present Illness  Admitted w left Tibia plat fx. Surgery scheduled for 03/07/13  Clinical Impression  Pt admitted with above diagnoses and unable to function at home until surgery.  Hopefully pt will progress well enough after surgery to be able to DC to her mother's home. Will continue to follow and await surgery on 03/07/13.     PT Assessment  Patient needs continued PT services    Follow Up Recommendations  Home health PT;Supervision for mobility/OOB (if able to go )    Does the patient have the potential to tolerate intense rehabilitation      Barriers to Discharge        Equipment Recommendations  Wheelchair (measurements PT)    Recommendations for Other Services     Frequency Min 5X/week    Precautions / Restrictions Precautions Precautions: Fall Required Braces or Orthoses: Knee Immobilizer - Left Knee Immobilizer - Left: On at all times Restrictions Weight Bearing Restrictions: Yes LLE Weight Bearing: Non weight bearing   Pertinent Vitals/Pain 5/10 pain in LLE at rest. Using PCA      Mobility  Bed Mobility Bed Mobility: Supine to Sit Supine to Sit: 3: Mod assist;HOB elevated;With rails Details for Bed Mobility Assistance: assist needed for LLE Transfers Transfers: Sit to Stand;Stand to Sit;Stand Pivot Transfers Sit to Stand: 4: Min assist;From bed;With upper extremity assist Stand to Sit: 4: Min assist;With upper extremity assist;To chair/3-in-1 Stand Pivot Transfers: 4: Min assist;Other (comment) (used RW) Details for Transfer Assistance: verbal cues for technique Ambulation/Gait Ambulation/Gait Assistance: Other (comment) (unable)    Exercises General Exercises - Lower Extremity Ankle Circles/Pumps: AROM;Both;10 reps;Supine   PT  Diagnosis: Difficulty walking  PT Problem List: Decreased strength;Decreased range of motion;Decreased activity tolerance;Decreased mobility;Decreased knowledge of use of DME;Pain PT Treatment Interventions: DME instruction;Gait training;Therapeutic activities;Therapeutic exercise;Patient/family education;Wheelchair mobility training     PT Goals(Current goals can be found in the care plan section) Acute Rehab PT Goals Patient Stated Goal: pt would like to got o her mother's house at DC PT Goal Formulation: With patient Time For Goal Achievement: 03/10/13 Potential to Achieve Goals: Good  Visit Information  Last PT Received On: 03/03/13 Assistance Needed: +1 History of Present Illness: Admitted w left Tibia plat fx. Surgery scheduled for 03/07/13       Prior Functioning  Home Living Family/patient expects to be discharged to:: Private residence Living Arrangements: Parent (Pt hopefult to DC to her mother's house) Available Help at Discharge: Family;Available 24 hours/day Type of Home: House Home Access: Level entry Home Layout: One level Home Equipment: Emergency planning/management officer - 2 wheels;Bedside commode (Pt was not sure if her walker had wheels or not) Prior Function Level of Independence: Independent (Prior to fracture) Communication Communication: No difficulties    Cognition  Cognition Arousal/Alertness: Awake/alert Behavior During Therapy: WFL for tasks assessed/performed Overall Cognitive Status: Within Functional Limits for tasks assessed    Extremity/Trunk Assessment Upper Extremity Assessment Upper Extremity Assessment: Overall WFL for tasks assessed Lower Extremity Assessment Lower Extremity Assessment: LLE deficits/detail;RLE deficits/detail RLE Deficits / Details: pt reports functional weakness in RLE since hip surgery in 2010 LLE Deficits / Details: pt in knee immobilizer LLE: Unable to fully assess due to pain;Unable to fully assess due to immobilization    Balance    End of Session PT - End  of Session Equipment Utilized During Treatment: Gait belt;Left knee immobilizer;Oxygen Activity Tolerance: Patient tolerated treatment well Patient left: in chair;with call bell/phone within reach Nurse Communication: Mobility status  GP     Donnella Sham 03/03/2013, 2:38 PM

## 2013-03-03 NOTE — Progress Notes (Signed)
  RD consulted for nutrition education regarding diabetes.   Lab Results  Component Value Date   HGBA1C 7.5* 03/02/2013    RD provided "Carbohydrate Counting for People with Diabetes" handout from the Academy of Nutrition and Dietetics. Discussed different food groups and their effects on blood sugar, emphasizing carbohydrate-containing foods. Provided list of carbohydrates and recommended serving sizes of common foods.  Discussed importance of controlled and consistent carbohydrate intake throughout the day. Provided examples of ways to balance meals/snacks and encouraged intake of high-fiber, whole grain complex carbohydrates. Teach back method used.  Pt reported minimal education regarding DM diet recommendations prior to admission. Was willing to start cutting back on sweetened beverages, consume more balanced meals/snacks, and to monitor carbohydrate portion sizes  Expect fair compliance. Pt's family eager to follow up with outpatient RD on further DM counseling. Pt appears to have good support system that will likely assist her in complying with DM diet recommendations   There is no weight on file to calculate BMI.   Current diet order is Carb Modified Medium, patient is consuming approximately 100% of meals at this time. Labs and medications reviewed. No further nutrition interventions warranted at this time. RD contact information provided. If additional nutrition issues arise, please re-consult RD.  Lloyd Huger MS RD LDN Clinical Dietitian [phone]

## 2013-03-03 NOTE — Care Management Note (Signed)
CARE MANAGEMENT NOTE 03/03/2013  Patient:  Adrienne [name], Adrienne [name]   Account Number:  [id number]  Date Initiated:  03/03/2013  Documentation initiated by:  Vance Peper  Subjective/Objective Assessment:   57 yr old female admitted with left tibial plateau fracture. Newly diagnosis Diabetic.     Action/Plan:   OR on Tuesday 03/07/13. CM will follow.  Patient's sister states she will need shortterm SNF d/t lack of care givers being available. Cm will notify SW to follow as well.  Choice for [facility] was offered. [facility] selected should patient go home.   Anticipated DC Date:     Anticipated DC Plan:           Choice offered to / List presented to:  C-1 Patient           HH agency  [facility]   Status of service:  In process, will continue to follow Comments:  03/03/13 1:00pm Vance Peper, RN BSN Case Manager patient was provided with information for [facility], to locate a primary Physician in her area.

## 2013-03-04 LAB — CBC
MCH: 31.1 pg (ref 26.0–34.0)
MCHC: 32.5 g/dL (ref 30.0–36.0)
Platelets: 206 10*3/uL (ref 150–400)
RDW: 14.1 % (ref 11.5–15.5)

## 2013-03-04 LAB — BASIC METABOLIC PANEL
Calcium: 8 mg/dL — ABNORMAL LOW (ref 8.4–10.5)
GFR calc non Af Amer: >90 mL/min (ref >90)
Glucose, Bld: 149 mg/dL — ABNORMAL HIGH (ref 70–99)
Sodium: 137 mEq/L (ref 135–145)

## 2013-03-04 LAB — GLUCOSE, CAPILLARY: Glucose-Capillary: 134 mg/dL — ABNORMAL HIGH (ref 70–99)

## 2013-03-04 MED ORDER — POLYETHYLENE GLYCOL 3350 17 G PO PACK
17.0000 g | PACK | Freq: Two times a day (BID) | ORAL | Status: DC
  Administered 2013-03-04 – 2013-03-09 (×11): 17 g via ORAL
  Filled 2013-03-04 (×19): qty 1

## 2013-03-04 NOTE — Progress Notes (Signed)
[facility] PROGRESS NOTE  Adrienne [name]:[id number] DOB: 05-22-1955 DOA: 03/02/2013 PCP: No PCP Per Patient  Assessment/Plan: 57 year old admitted 12-25 with new diagnosis of diabetes and left tibia fracture after mechanical fall.   1-Left tibial fracture: Dr Eulah Pont planning surgery 12-30.  -Continue with  Lovenox for DVT prophylaxis, hold the day of surgery.  -Continue with PCA pump, monitor for over sedation.  -Docusate for Bowel regimen. Will add miralx today.   2-Diabetes mellitus type 2, uncontrolled, new onset: HB A-1c 7.5. Continue with SSI.   3-Anxiety: We'll continue her Xanax PRN.  Asthma:  albuterol nebs when necessary.   Depression: Continue home meds   History of closed head injury: Per the patient this was the source of her seizures and other new problems such as depression. The patient says she has not had any seizures for over 5 years.   Leukocytosis: probably reactive, monitor.   Code Status: Full Code.  Family Communication: Care discussed with patient.  Disposition Plan: remain inpatient.    Consultants:  Dr Eulah Pont.   Procedures:  none  Antibiotics:  none  HPI/Subjective: Feeling well. No dyspnea. Pain controlled.  Per nurse staff no oversedation notice with PCA pump.   Objective: Filed Vitals:   03/04/13 1342  BP: 105/52  Pulse: 70  Temp: 98.8 F (37.1 C)  Resp: 16    Intake/Output Summary (Last 24 hours) at 03/04/13 1353 Last data filed at 03/04/13 1300  Gross per 24 hour  Intake 2303.33 ml  Output      0 ml  Net 2303.33 ml   There were no vitals filed for this visit.  Exam:   General:  No distress.   Cardiovascular: S 1, S 2 RRR  Respiratory: CTA  Abdomen: Bs present, soft, NT  Musculoskeletal: left knee LE with immobilizer.   Data Reviewed: Basic Metabolic Panel:  Recent Labs Lab 03/02/13 1550 03/02/13 2258 03/03/13 0530 03/04/13 0651  NA 135  --  141 137  K 4.0  --  3.7 3.8  CL 97  --  103 102   CO2 21  --  28 25  GLUCOSE 216*  --  191* 149*  BUN 13  --  12 8  CREATININE 0.64  --  0.64 0.60  CALCIUM 9.0  --  8.4 8.0*  MG  --  2.0  --   --   PHOS  --  4.1  --   --    Liver Function Tests:  Recent Labs Lab 03/03/13 0530  AST 29  ALT 21  ALKPHOS 56  BILITOT 0.5  PROT 6.0  ALBUMIN 2.9*   No results found for this basename: LIPASE, AMYLASE,  in the last 168 hours No results found for this basename: AMMONIA,  in the last 168 hours CBC:  Recent Labs Lab 03/02/13 1550 03/03/13 0530 03/04/13 0651  WBC 14.2* 10.6* 11.5*  NEUTROABS 9.3*  --   --   HGB 15.1* 13.7 13.2  HCT 45.1 41.3 40.6  MCV 92.6 93.7 95.5  PLT 249 230 206   Cardiac Enzymes: No results found for this basename: CKTOTAL, CKMB, CKMBINDEX, TROPONINI,  in the last 168 hours BNP (last 3 results) No results found for this basename: PROBNP,  in the last 8760 hours CBG:  Recent Labs Lab 03/03/13 1151 03/03/13 1634 03/03/13 2223 03/04/13 0703 03/04/13 1125  GLUCAP 131* 144* 144* 126* 134*    Recent Results (from the past 240 hour(s))  SURGICAL PCR SCREEN  Status: None   Collection Time    03/03/13  1:23 AM      Result Value Range Status   MRSA, PCR NEGATIVE  NEGATIVE Final   Staphylococcus aureus NEGATIVE  NEGATIVE Final   Comment:            The Xpert SA Assay (FDA     approved for NASAL specimens     in patients over 6 years of age),     is one component of     a comprehensive surveillance     program.  Test performance has     been validated by [employer] for patients greater     than or equal to 79 year old.     It is not intended     to diagnose infection nor to     guide or monitor treatment.     Studies: Dg Hip Complete Right  03/02/2013   CLINICAL DATA:  Fall. Right hip pain. Hip arthroplasty in near 2010.  EXAM: RIGHT HIP - COMPLETE 2+ VIEW  COMPARISON:  12/16/2007 radiographs.  FINDINGS: Uncomplicated right total hip arthroplasty. There is mild lateral migration of  the distal femoral stem. No periprosthetic fracture is present. Acetabular cup appears within normal limits. The obturator rings are intact. Moderate to severe left hip osteoarthritis is incidentally noted. L4-L5 right-sided spondylosis.  IMPRESSION: Uncomplicated right total hip arthroplasty. No acute osseous abnormality.   Electronically Signed   By: Andreas Newport M.D.   On: 03/02/2013 19:18    Scheduled Meds: . chlorhexidine  60 mL Topical Once  . docusate sodium  100 mg Oral BID  . enoxaparin (LOVENOX) injection  40 mg Subcutaneous Daily  . [START ON 03/08/2013] enoxaparin (LOVENOX) injection  40 mg Subcutaneous Daily  . furosemide  20 mg Oral Daily  . insulin aspart  0-15 Units Subcutaneous TID WC  . methylphenidate  10 mg Oral BID  . morphine   Intravenous Q4H  . pantoprazole  40 mg Oral Daily  . sodium chloride  3 mL Intravenous Q12H  . venlafaxine XR  150 mg Oral Q breakfast   Continuous Infusions: . sodium chloride 100 mL/hr at 03/04/13 1610    Principal Problem:   Fracture of left tibial plateau Active Problems:   DIABETES MELLITUS, TYPE II, UNCONTROLLED   ANXIETY   DEPRESSION   RESTLESS LEG SYNDROME   ALLERGIC RHINITIS   ASTHMA   GERD   ARTHRITIS   SEIZURE DISORDER   SLEEP APNEA   Headache(784.0)   Barrett's esophagus   Left tibial fracture   Hematoma    Time spent: 30 minutes.     Lylah Lantis  [facility] Pager [phone]. If 7PM-7AM, please contact night-coverage at [email or web address], password [facility] 03/04/2013, 1:53 PM  LOS: 2 days

## 2013-03-05 LAB — CBC
HCT: 38.7 % (ref 36.0–46.0)
MCH: 31.1 pg (ref 26.0–34.0)
MCV: 95.6 fL (ref 78.0–100.0)
Platelets: 216 10*3/uL (ref 150–400)
RBC: 4.05 MIL/uL (ref 3.87–5.11)
RDW: 13.8 % (ref 11.5–15.5)

## 2013-03-05 LAB — BASIC METABOLIC PANEL
Calcium: 8.2 mg/dL — ABNORMAL LOW (ref 8.4–10.5)
Creatinine, Ser: 0.72 mg/dL (ref 0.50–1.10)
GFR calc Af Amer: >90 mL/min (ref >90)
GFR calc non Af Amer: >90 mL/min (ref >90)
Sodium: 136 mEq/L (ref 135–145)

## 2013-03-05 LAB — GLUCOSE, CAPILLARY
Glucose-Capillary: 123 mg/dL — ABNORMAL HIGH (ref 70–99)
Glucose-Capillary: 123 mg/dL — ABNORMAL HIGH (ref 70–99)

## 2013-03-05 MED ORDER — FENTANYL 25 MCG/HR TD PT72
25.0000 ug | MEDICATED_PATCH | TRANSDERMAL | Status: DC
  Administered 2013-03-05 – 2013-03-08 (×2): 25 ug via TRANSDERMAL
  Filled 2013-03-05 (×2): qty 1

## 2013-03-05 MED ORDER — HYDROMORPHONE HCL PF 1 MG/ML IJ SOLN
1.0000 mg | INTRAMUSCULAR | Status: DC | PRN
  Administered 2013-03-05 – 2013-03-09 (×26): 1 mg via INTRAVENOUS
  Administered 2013-03-09: 0.5 mg via INTRAVENOUS
  Administered 2013-03-09 – 2013-03-10 (×6): 1 mg via INTRAVENOUS
  Filled 2013-03-05 (×33): qty 1

## 2013-03-05 NOTE — Progress Notes (Signed)
[facility] PROGRESS NOTE  Adrienne [name]:[id number] DOB: 05/10/1955 DOA: 03/02/2013 PCP: No PCP Per Patient  Assessment/Plan: 57 year old admitted 12-25 with new diagnosis of diabetes and left tibia fracture after mechanical fall.   1-Left tibial fracture: Dr Eulah Pont planning surgery 12-30.  -Continue with  Lovenox for DVT prophylaxis, hold the day of surgery.  -Will d/c PCA and start Fentanyl patch 25 mcg q 72 hrs and Dilaudid 1 mg q 2 hr prn. -Docusate, Miralax  for Bowel regimen.   2-Diabetes mellitus type 2, uncontrolled, new onset: HB A-1c 7.5. Continue with SSI.   3-Anxiety: We'll continue her Xanax PRN.  Asthma:  albuterol nebs when necessary.   Depression: Continue home meds   History of closed head injury: Per the patient this was the source of her seizures and other new problems such as depression. The patient says she has not had any seizures for over 5 years.   Leukocytosis: probably reactive, monitor.   Code Status: Full Code.  Family Communication: Care discussed with patient.  Disposition Plan: remain inpatient.    Consultants:  Dr Eulah Pont.   Procedures:  none  Antibiotics:  none  HPI/Subjective: Patient seen and examined, does not want PCA pump.   Objective: Filed Vitals:   03/05/13 1250  BP:   Pulse:   Temp:   Resp: 18    Intake/Output Summary (Last 24 hours) at 03/05/13 1448 Last data filed at 03/05/13 0800  Gross per 24 hour  Intake   1660 ml  Output    800 ml  Net    860 ml   There were no vitals filed for this visit.  Exam:  Physical Exam: Head: Normocephalic, atraumatic.  Eyes: No signs of jaundice, EOMI Nose: Mucous membranes dry.  Throat: Oropharynx nonerythematous, no exudate appreciated.  Neck: supple,No deformities, masses, or tenderness noted. Lungs: Normal respiratory effort. B/L Clear to auscultation, no crackles or wheezes.  Heart: Regular RR. S1 and S2 normal  Abdomen: BS normoactive. Soft, Nondistended,  non-tender.  Extremities: LLE in knee immobilizer   Data Reviewed: Basic Metabolic Panel:  Recent Labs Lab 03/02/13 1550 03/02/13 2258 03/03/13 0530 03/04/13 0651 03/05/13 0830  NA 135  --  141 137 136  K 4.0  --  3.7 3.8 3.9  CL 97  --  103 102 100  CO2 21  --  28 25 28   GLUCOSE 216*  --  191* 149* 195*  BUN 13  --  12 8 9   CREATININE 0.64  --  0.64 0.60 0.72  CALCIUM 9.0  --  8.4 8.0* 8.2*  MG  --  2.0  --   --   --   PHOS  --  4.1  --   --   --    Liver Function Tests:  Recent Labs Lab 03/03/13 0530  AST 29  ALT 21  ALKPHOS 56  BILITOT 0.5  PROT 6.0  ALBUMIN 2.9*   No results found for this basename: LIPASE, AMYLASE,  in the last 168 hours No results found for this basename: AMMONIA,  in the last 168 hours CBC:  Recent Labs Lab 03/02/13 1550 03/03/13 0530 03/04/13 0651 03/05/13 0830  WBC 14.2* 10.6* 11.5* 11.5*  NEUTROABS 9.3*  --   --   --   HGB 15.1* 13.7 13.2 12.6  HCT 45.1 41.3 40.6 38.7  MCV 92.6 93.7 95.5 95.6  PLT 249 230 206 216   Cardiac Enzymes: No results found for this basename: CKTOTAL, CKMB,  CKMBINDEX, TROPONINI,  in the last 168 hours BNP (last 3 results) No results found for this basename: PROBNP,  in the last 8760 hours CBG:  Recent Labs Lab 03/04/13 1125 03/04/13 1634 03/04/13 2150 03/05/13 0646 03/05/13 1131  GLUCAP 134* 167* 123* 123* 118*    Recent Results (from the past 240 hour(s))  SURGICAL PCR SCREEN     Status: None   Collection Time    03/03/13  1:23 AM      Result Value Range Status   MRSA, PCR NEGATIVE  NEGATIVE Final   Staphylococcus aureus NEGATIVE  NEGATIVE Final   Comment:            The Xpert SA Assay (FDA     approved for NASAL specimens     in patients over 61 years of age),     is one component of     a comprehensive surveillance     program.  Test performance has     been validated by [employer] for patients greater     than or equal to 76 year old.     It is not intended     to  diagnose infection nor to     guide or monitor treatment.     Studies: No results found.  Scheduled Meds: . chlorhexidine  60 mL Topical Once  . docusate sodium  100 mg Oral BID  . enoxaparin (LOVENOX) injection  40 mg Subcutaneous Daily  . [START ON 03/08/2013] enoxaparin (LOVENOX) injection  40 mg Subcutaneous Daily  . fentaNYL  25 mcg Transdermal Q72H  . furosemide  20 mg Oral Daily  . insulin aspart  0-15 Units Subcutaneous TID WC  . methylphenidate  10 mg Oral BID  . pantoprazole  40 mg Oral Daily  . polyethylene glycol  17 g Oral BID  . sodium chloride  3 mL Intravenous Q12H  . venlafaxine XR  150 mg Oral Q breakfast   Continuous Infusions: . sodium chloride 100 mL/hr at 03/05/13 1610    Principal Problem:   Fracture of left tibial plateau Active Problems:   DIABETES MELLITUS, TYPE II, UNCONTROLLED   ANXIETY   DEPRESSION   RESTLESS LEG SYNDROME   ALLERGIC RHINITIS   ASTHMA   GERD   ARTHRITIS   SEIZURE DISORDER   SLEEP APNEA   Headache(784.0)   Barrett's esophagus   Left tibial fracture   Hematoma    Time spent: 30 minutes.     [facility]  [facility] Pager [phone]. If 7PM-7AM, please contact night-coverage at [email or web address], password [facility] 03/05/2013, 2:48 PM  LOS: 3 days

## 2013-03-06 DIAGNOSIS — M7989 Other specified soft tissue disorders: Secondary | ICD-10-CM

## 2013-03-06 DIAGNOSIS — M79609 Pain in unspecified limb: Secondary | ICD-10-CM

## 2013-03-06 DIAGNOSIS — F329 Major depressive disorder, single episode, unspecified: Secondary | ICD-10-CM

## 2013-03-06 DIAGNOSIS — S82209A Unspecified fracture of shaft of unspecified tibia, initial encounter for closed fracture: Secondary | ICD-10-CM

## 2013-03-06 LAB — CBC
HCT: 40.4 % (ref 36.0–46.0)
Hemoglobin: 13.2 g/dL (ref 12.0–15.0)
MCV: 95.7 fL (ref 78.0–100.0)
RBC: 4.22 MIL/uL (ref 3.87–5.11)
RDW: 14.2 % (ref 11.5–15.5)
WBC: 10.3 10*3/uL (ref 4.0–10.5)

## 2013-03-06 LAB — GLUCOSE, CAPILLARY
Glucose-Capillary: 136 mg/dL — ABNORMAL HIGH (ref 70–99)
Glucose-Capillary: 127 mg/dL — ABNORMAL HIGH (ref 70–99)

## 2013-03-06 MED ORDER — DIPHENHYDRAMINE HCL 50 MG/ML IJ SOLN
12.5000 mg | INTRAMUSCULAR | Status: DC | PRN
  Administered 2013-03-06 – 2013-03-07 (×2): 12.5 mg via INTRAVENOUS
  Filled 2013-03-06: qty 1

## 2013-03-06 MED ORDER — VANCOMYCIN HCL IN DEXTROSE 750-5 MG/150ML-% IV SOLN
750.0000 mg | Freq: Once | INTRAVENOUS | Status: AC
  Administered 2013-03-06: 750 mg via INTRAVENOUS
  Filled 2013-03-06 (×2): qty 150

## 2013-03-06 MED ORDER — DIPHENHYDRAMINE HCL 12.5 MG/5ML PO ELIX
12.5000 mg | ORAL_SOLUTION | ORAL | Status: DC | PRN
  Administered 2013-03-06 – 2013-03-09 (×10): 12.5 mg via ORAL
  Filled 2013-03-06 (×10): qty 10

## 2013-03-06 MED ORDER — VANCOMYCIN HCL IN DEXTROSE 750-5 MG/150ML-% IV SOLN
750.0000 mg | Freq: Two times a day (BID) | INTRAVENOUS | Status: DC
  Administered 2013-03-07 – 2013-03-09 (×8): 750 mg via INTRAVENOUS
  Filled 2013-03-06 (×9): qty 150

## 2013-03-06 MED ORDER — ALBUTEROL SULFATE (2.5 MG/3ML) 0.083% IN NEBU: 2.5000 mg | INHALATION_SOLUTION | RESPIRATORY_TRACT | Status: DC | PRN

## 2013-03-06 NOTE — Progress Notes (Signed)
ANTIBIOTIC CONSULT NOTE - INITIAL  Pharmacy Consult for Vancomycin Indication: cellulitis  Allergies  Allergen Reactions  . Codeine     REACTION: headaches  . Morphine And Related Itching  . Penicillins Rash  . Sulfonamide Derivatives Rash    Patient Measurements: Height: 5' 5.35" (166 cm) Weight: 169 lb 12.1 oz (77 kg) IBW/kg (Calculated) : 57.81  Vital Signs: Temp: 97.8 F (36.6 C) (12/29 1317) BP: 97/55 mmHg (12/29 1317) Pulse Rate: 57 (12/29 1317) Intake/Output from previous day: 12/28 0701 - 12/29 0700 In: 2140 [P.O.:1040; I.V.:1100] Out: -  Intake/Output from this shift: Total I/O In: 240 [P.O.:240] Out: -   Labs:  Recent Labs  03/04/13 0651 03/05/13 0830  WBC 11.5* 11.5*  HGB 13.2 12.6  PLT 206 216  CREATININE 0.60 0.72    Microbiology: Recent Results (from the past 720 hour(s))  SURGICAL PCR SCREEN     Status: None   Collection Time    03/03/13  1:23 AM      Result Value Range Status   MRSA, PCR NEGATIVE  NEGATIVE Final   Staphylococcus aureus NEGATIVE  NEGATIVE Final   Comment:            The Xpert SA Assay (FDA     approved for NASAL specimens     in patients over 41 years of age),     is one component of     a comprehensive surveillance     program.  Test performance has     been validated by [employer] for patients greater     than or equal to 35 year old.     It is not intended     to diagnose infection nor to     guide or monitor treatment.    Medical History: Past Medical History  Diagnosis Date  . Allergic rhinitis   . Anemia     leukocytosis/thrombocytosis--Dr. Mariel Sleet  . Asthma   . Depression     ? Bipolar Disorder--Dr, Mayford Knife  . GERD (gastroesophageal reflux disease)     Barrett's esophagus  . Low back pain   . Seizure disorder     ? absence  . Macular degeneration, bilateral     Dry-Dr. Lita Mains  . Arthritis   . Hx of migraines   . Brain injury     Closed head  . OSA (obstructive sleep apnea)   . RLS  (restless legs syndrome)   . NIDDM (non-insulin dependent diabetes mellitus)   . Status post right hip replacement   . Status post rotator cuff surgery     Assessment: 57 year old admitted 12/25 with new diagnosis of DM and a left tibia fracture after a fall.  Planning surgery 12/30.  Pharmacy asked to begin vancomycin for cellulitis.  Goal of Therapy:  Vancomycin trough level 10-15 mcg/ml  Plan:  1) Vancomycin 750 mg iv Q 12 hours 2) Follow up plan, Scr, fever trend.  Thank you. Okey Regal, PharmD [phone]  Elwin Sleight 03/06/2013,1:30 PM

## 2013-03-06 NOTE — Progress Notes (Signed)
[facility] PROGRESS NOTE  Adrienne [name]:[id number] DOB: 11-Apr-1955 DOA: 03/02/2013 PCP: No PCP Per Patient  Assessment/Plan: 57 year old admitted 12-25 with new diagnosis of diabetes and left tibia fracture after mechanical fall.   1-Left tibial fracture: Dr Eulah Pont planning surgery 12-30.  -Continue with  Lovenox for DVT prophylaxis, hold the day of surgery.  -Pain control is better after  d/c PCA and started Fentanyl patch 25 mcg q 72 hrs and Dilaudid 1 mg q 2 hr prn. -Docusate, Miralax  for Bowel regimen.   2-Diabetes mellitus type 2, uncontrolled, new onset: HB A-1c 7.5. Continue with SSI.   3-Anxiety: We'll continue her Xanax PRN.  Asthma:  albuterol nebs when necessary.   Depression: Continue home meds   History of closed head injury: Per the patient this was the source of her seizures and other new problems such as depression. The patient says she has not had any seizures for over 5 years.   Leukocytosis: probably reactive, monitor.   Code Status: Full Code.  Family Communication: Care discussed with patient.  Disposition Plan: remain inpatient.    Consultants:  Dr Eulah Pont.   Procedures:  none  Antibiotics:  none  HPI/Subjective: Patient seen and examined, pain is better controlled with fentanyl patch and prn dilaudid.   Objective: Filed Vitals:   03/05/13 2101  BP: 90/45  Pulse: 65  Temp: 99.2 F (37.3 C)  Resp: 18    Intake/Output Summary (Last 24 hours) at 03/06/13 8119 Last data filed at 03/05/13 2000  Gross per 24 hour  Intake   2140 ml  Output      0 ml  Net   2140 ml   There were no vitals filed for this visit.  Exam:  Physical Exam: Head: Normocephalic, atraumatic.  Eyes: No signs of jaundice, EOMI Nose: Mucous membranes dry.  Throat: Oropharynx nonerythematous, no exudate appreciated.  Neck: supple,No deformities, masses, or tenderness noted. Lungs: Normal respiratory effort. B/L Clear to auscultation, no crackles or  wheezes.  Heart: Regular RR. S1 and S2 normal  Abdomen: BS normoactive. Soft, Nondistended, non-tender.  Extremities: LLE in knee immobilizer   Data Reviewed: Basic Metabolic Panel:  Recent Labs Lab 03/02/13 1550 03/02/13 2258 03/03/13 0530 03/04/13 0651 03/05/13 0830  NA 135  --  141 137 136  K 4.0  --  3.7 3.8 3.9  CL 97  --  103 102 100  CO2 21  --  28 25 28   GLUCOSE 216*  --  191* 149* 195*  BUN 13  --  12 8 9   CREATININE 0.64  --  0.64 0.60 0.72  CALCIUM 9.0  --  8.4 8.0* 8.2*  MG  --  2.0  --   --   --   PHOS  --  4.1  --   --   --    Liver Function Tests:  Recent Labs Lab 03/03/13 0530  AST 29  ALT 21  ALKPHOS 56  BILITOT 0.5  PROT 6.0  ALBUMIN 2.9*   No results found for this basename: LIPASE, AMYLASE,  in the last 168 hours No results found for this basename: AMMONIA,  in the last 168 hours CBC:  Recent Labs Lab 03/02/13 1550 03/03/13 0530 03/04/13 0651 03/05/13 0830  WBC 14.2* 10.6* 11.5* 11.5*  NEUTROABS 9.3*  --   --   --   HGB 15.1* 13.7 13.2 12.6  HCT 45.1 41.3 40.6 38.7  MCV 92.6 93.7 95.5 95.6  PLT 249 230  206 216   Cardiac Enzymes: No results found for this basename: CKTOTAL, CKMB, CKMBINDEX, TROPONINI,  in the last 168 hours BNP (last 3 results) No results found for this basename: PROBNP,  in the last 8760 hours CBG:  Recent Labs Lab 03/04/13 2150 03/05/13 0646 03/05/13 1131 03/05/13 1620 03/05/13 2148  GLUCAP 123* 123* 118* 127* 113*    Recent Results (from the past 240 hour(s))  SURGICAL PCR SCREEN     Status: None   Collection Time    03/03/13  1:23 AM      Result Value Range Status   MRSA, PCR NEGATIVE  NEGATIVE Final   Staphylococcus aureus NEGATIVE  NEGATIVE Final   Comment:            The Xpert SA Assay (FDA     approved for NASAL specimens     in patients over 21 years of age),     is one component of     a comprehensive surveillance     program.  Test performance has     been validated by [employer] for patients greater     than or equal to 42 year old.     It is not intended     to diagnose infection nor to     guide or monitor treatment.     Studies: No results found.  Scheduled Meds: . chlorhexidine  60 mL Topical Once  . docusate sodium  100 mg Oral BID  . enoxaparin (LOVENOX) injection  40 mg Subcutaneous Daily  . [START ON 03/08/2013] enoxaparin (LOVENOX) injection  40 mg Subcutaneous Daily  . fentaNYL  25 mcg Transdermal Q72H  . furosemide  20 mg Oral Daily  . insulin aspart  0-15 Units Subcutaneous TID WC  . methylphenidate  10 mg Oral BID  . pantoprazole  40 mg Oral Daily  . polyethylene glycol  17 g Oral BID  . sodium chloride  3 mL Intravenous Q12H  . venlafaxine XR  150 mg Oral Q breakfast   Continuous Infusions: . sodium chloride 100 mL/hr at 03/05/13 1610    Principal Problem:   Fracture of left tibial plateau Active Problems:   DIABETES MELLITUS, TYPE II, UNCONTROLLED   ANXIETY   DEPRESSION   RESTLESS LEG SYNDROME   ALLERGIC RHINITIS   ASTHMA   GERD   ARTHRITIS   SEIZURE DISORDER   SLEEP APNEA   Headache(784.0)   Barrett's esophagus   Left tibial fracture   Hematoma    Time spent: 30 minutes.     [facility]  [facility] Pager [phone]. If 7PM-7AM, please contact night-coverage at [email or web address], password [facility] 03/06/2013, 5:08 AM  LOS: 4 days

## 2013-03-06 NOTE — Progress Notes (Signed)
Physical Therapy Treatment Patient Details Name: Adrienne [name] MRN: [id number] DOB: 02/23/1956 Today's Date: 03/06/2013 Time: 8295-6213 PT Time Calculation (min): 20 min  PT Assessment / Plan / Recommendation  History of Present Illness Admitted w left Tibia plat fx. Surgery scheduled for 03/07/13   PT Comments   Pt progressing towards physical therapy goals, and was able to ambulate from bed to chair today. Scheduled for surgical fixation of tibial plateau fracture tomorrow; will continue plan of care as appropriate.   Follow Up Recommendations  Home health PT;Supervision for mobility/OOB     Does the patient have the potential to tolerate intense rehabilitation     Barriers to Discharge        Equipment Recommendations  Wheelchair (measurements PT);Wheelchair cushion (measurements PT)    Recommendations for Other Services    Frequency Min 5X/week   Progress towards PT Goals Progress towards PT goals: Progressing toward goals  Plan Current plan remains appropriate    Precautions / Restrictions Precautions Precautions: Fall Required Braces or Orthoses: Knee Immobilizer - Left Knee Immobilizer - Left: On at all times Restrictions Weight Bearing Restrictions: Yes LLE Weight Bearing: Non weight bearing   Pertinent Vitals/Pain 7/10 at rest after session. Pt declines calling nurse for pain medication.     Mobility  Bed Mobility Bed Mobility: Supine to Sit;Sitting - Scoot to Edge of Bed Supine to Sit: 4: Min assist;HOB elevated;With rails Sitting - Scoot to Edge of Bed: 4: Min assist Details for Bed Mobility Assistance: Assist for movement and support of LLE. Pt able to scoot to EOB with bed pad assist.  Transfers Transfers: Sit to Stand;Stand to Sit Sit to Stand: 4: Min assist;From bed;With upper extremity assist Stand to Sit: 4: Min assist;With upper extremity assist;To chair/3-in-1 Details for Transfer Assistance: VC's for hand placement on seated surface for safety. Pt  able to maintain NWB status well.   Ambulation/Gait Ambulation/Gait Assistance: 4: Min guard Ambulation Distance (Feet): 5 Feet Assistive device: Rolling walker Ambulation/Gait Assistance Details: No physical assist required; pt was able to maintain NWB status and demonstrated proper safety awareness and technique.  Gait Pattern:  (2-point gait pattern as pt is NWB) Gait velocity: Decreased    Exercises General Exercises - Lower Extremity Ankle Circles/Pumps: 10 reps Quad Sets: 10 reps Hip ABduction/ADduction: 10 reps   PT Diagnosis:    PT Problem List:   PT Treatment Interventions:     PT Goals (current goals can now be found in the care plan section) Acute Rehab PT Goals Patient Stated Goal: pt would like to go to her mother's house at [place] PT Goal Formulation: With patient Time For Goal Achievement: 03/10/13 Potential to Achieve Goals: Good  Visit Information  Last PT Received On: 03/06/13 Assistance Needed: +1 History of Present Illness: Admitted w left Tibia plat fx. Surgery scheduled for 03/07/13    Subjective Data  Subjective: "It takes a few minutes for the pain to settle down after I move it." Patient Stated Goal: pt would like to go to her mother's house at [place]   Cognition  Cognition Arousal/Alertness: Awake/alert Behavior During Therapy: [facility] for tasks assessed/performed Overall Cognitive Status: Within Functional Limits for tasks assessed    Balance     End of Session PT - End of Session Equipment Utilized During Treatment: Gait belt;Left knee immobilizer Activity Tolerance: Patient tolerated treatment well Patient left: in chair;with call bell/phone within reach Nurse Communication: Mobility status   GP     Ruthann Cancer 03/06/2013, 5:15  PM  Ruthann Cancer, PT, DPT [phone]

## 2013-03-06 NOTE — Progress Notes (Signed)
Patient ID: Adrienne [name], female   DOB: Sep 01, 1955, 57 y.o.   MRN: [id number]     Subjective:  Patient reports pain as mild to moderate.  Patient doing well at rest but has pain with motion or ambulation.  Objective:   VITALS:   Filed Vitals:   03/05/13 1250 03/05/13 1446 03/05/13 2101 03/06/13 0615  BP:  97/62 90/45 122/62  Pulse:  71 65 70  Temp:  99.4 F (37.4 C) 99.2 F (37.3 C) 99 F (37.2 C)  TempSrc:  Oral Oral   Resp: 18 18 18 18   SpO2: 96% 95% 95% 92%    ABD soft Sensation intact distally Dorsiflexion/Plantar flexion intact   Lab Results  Component Value Date   WBC 11.5* 03/05/2013   HGB 12.6 03/05/2013   HCT 38.7 03/05/2013   MCV 95.6 03/05/2013   PLT 216 03/05/2013     Assessment/Plan:     Principal Problem:   Fracture of left tibial plateau Active Problems:   DIABETES MELLITUS, TYPE II, UNCONTROLLED   ANXIETY   DEPRESSION   RESTLESS LEG SYNDROME   ALLERGIC RHINITIS   ASTHMA   GERD   ARTHRITIS   SEIZURE DISORDER   SLEEP APNEA   Headache(784.0)   Barrett's esophagus   Left tibial fracture   Hematoma   Advance diet Up with therapy Continue knee immobilizer Plan ORIF tomorrow NPO after Midnight NWB   [name] Janace Litten 03/06/2013, 8:55 AM   Margarita Rana MD [phone]

## 2013-03-06 NOTE — Progress Notes (Signed)
*  Preliminary Results* Left upper extremity venous duplex completed. Left upper extremity is negative for deep and superficial vein thrombosis.  03/06/2013 2:01 PM  Gertie Fey, RVT, RDCS, RDMS

## 2013-03-06 NOTE — Progress Notes (Signed)
Called by nurse to evaluate the patient for left upper extremity redness and swelling. Patient developed swelling today, complains of pain in the arm. On exam; left upper extremity is erythematous and edematous, warm to touch  A/P ? Cellulitis ?  DVT Will start vancomycin per pharmacy and obtain venous duplex of left upper extremity

## 2013-03-07 ENCOUNTER — Encounter (HOSPITAL_COMMUNITY): Payer: Self-pay

## 2013-03-07 ENCOUNTER — Inpatient Hospital Stay (HOSPITAL_COMMUNITY): Payer: Medicare Other | Admitting: Anesthesiology

## 2013-03-07 ENCOUNTER — Inpatient Hospital Stay (HOSPITAL_COMMUNITY): Payer: Medicare Other

## 2013-03-07 ENCOUNTER — Encounter (HOSPITAL_COMMUNITY)
Admission: EM | Disposition: A | Discharge status: Skilled Nursing Facility | Payer: Self-pay | Source: Home / Self Care | Attending: Family Medicine

## 2013-03-07 ENCOUNTER — Encounter (HOSPITAL_COMMUNITY): Payer: Medicare Other | Admitting: Anesthesiology

## 2013-03-07 DIAGNOSIS — L0291 Cutaneous abscess, unspecified: Secondary | ICD-10-CM

## 2013-03-07 DIAGNOSIS — L0390 Cellulitis, unspecified: Secondary | ICD-10-CM

## 2013-03-07 HISTORY — PX: FRACTURE SURGERY: SHX138

## 2013-03-07 HISTORY — PX: SHOULDER INJECTION: SHX5048

## 2013-03-07 HISTORY — PX: ORIF TIBIA PLATEAU: SHX2132

## 2013-03-07 LAB — CBC
HCT: 39.2 % (ref 36.0–46.0)
Hemoglobin: 12.8 g/dL (ref 12.0–15.0)
MCHC: 32.7 g/dL (ref 30.0–36.0)
RBC: 4.08 MIL/uL (ref 3.87–5.11)
WBC: 12.6 10*3/uL — ABNORMAL HIGH (ref 4.0–10.5)

## 2013-03-07 LAB — CREATININE, SERUM
Creatinine, Ser: 0.66 mg/dL (ref 0.50–1.10)
GFR calc Af Amer: >90 mL/min (ref >90)
GFR calc non Af Amer: >90 mL/min (ref >90)

## 2013-03-07 LAB — GLUCOSE, CAPILLARY
Glucose-Capillary: 109 mg/dL — ABNORMAL HIGH (ref 70–99)
Glucose-Capillary: 157 mg/dL — ABNORMAL HIGH (ref 70–99)
Glucose-Capillary: 143 mg/dL — ABNORMAL HIGH (ref 70–99)
Glucose-Capillary: 133 mg/dL — ABNORMAL HIGH (ref 70–99)

## 2013-03-07 SURGERY — OPEN REDUCTION INTERNAL FIXATION (ORIF) TIBIAL PLATEAU
Anesthesia: General | Site: Shoulder | Laterality: Left

## 2013-03-07 MED ORDER — METOCLOPRAMIDE HCL 5 MG/ML IJ SOLN: 10.0000 mg | Freq: Once | INTRAMUSCULAR | Status: DC | PRN

## 2013-03-07 MED ORDER — BUPIVACAINE HCL (PF) 0.25 % IJ SOLN
INTRAMUSCULAR | Status: AC
  Filled 2013-03-07: qty 10

## 2013-03-07 MED ORDER — LACTATED RINGERS IV SOLN
INTRAVENOUS | Status: DC
  Administered 2013-03-07: 09:00:00 via INTRAVENOUS

## 2013-03-07 MED ORDER — DIAZEPAM 5 MG/ML IJ SOLN
INTRAMUSCULAR | Status: AC
  Filled 2013-03-07: qty 2

## 2013-03-07 MED ORDER — ENOXAPARIN SODIUM 40 MG/0.4ML ~~LOC~~ SOLN
40.0000 mg | SUBCUTANEOUS | Status: DC
  Administered 2013-03-07 – 2013-03-09 (×3): 40 mg via SUBCUTANEOUS
  Filled 2013-03-07 (×4): qty 0.4

## 2013-03-07 MED ORDER — DEXTROSE-NACL 5-0.45 % IV SOLN: INTRAVENOUS | Status: AC

## 2013-03-07 MED ORDER — HYDROMORPHONE HCL PF 1 MG/ML IJ SOLN: 0.2500 mg | INTRAMUSCULAR | Status: DC | PRN

## 2013-03-07 MED ORDER — HYDROMORPHONE HCL PF 1 MG/ML IJ SOLN
INTRAMUSCULAR | Status: AC
  Filled 2013-03-07: qty 1

## 2013-03-07 MED ORDER — BUPIVACAINE HCL (PF) 0.25 % IJ SOLN
INTRAMUSCULAR | Status: AC
  Filled 2013-03-07: qty 30

## 2013-03-07 MED ORDER — GLYCOPYRROLATE 0.2 MG/ML IJ SOLN
INTRAMUSCULAR | Status: DC | PRN
  Administered 2013-03-07: .8 mg via INTRAVENOUS

## 2013-03-07 MED ORDER — LACTATED RINGERS IV SOLN
INTRAVENOUS | Status: DC | PRN
  Administered 2013-03-07 (×2): via INTRAVENOUS

## 2013-03-07 MED ORDER — ARTIFICIAL TEARS OP OINT
TOPICAL_OINTMENT | OPHTHALMIC | Status: DC | PRN
  Administered 2013-03-07: 1 via OPHTHALMIC

## 2013-03-07 MED ORDER — DIAZEPAM 5 MG/ML IJ SOLN
5.0000 mg | Freq: Once | INTRAMUSCULAR | Status: AC
  Administered 2013-03-07: 5 mg via INTRAVENOUS

## 2013-03-07 MED ORDER — OXYCODONE HCL 5 MG/5ML PO SOLN: 5.0000 mg | Freq: Once | ORAL | Status: AC | PRN

## 2013-03-07 MED ORDER — NEOSTIGMINE METHYLSULFATE 1 MG/ML IJ SOLN
INTRAMUSCULAR | Status: DC | PRN
  Administered 2013-03-07: 4 mg via INTRAVENOUS

## 2013-03-07 MED ORDER — 0.9 % SODIUM CHLORIDE (POUR BTL) OPTIME
TOPICAL | Status: DC | PRN
  Administered 2013-03-07: 1000 mL

## 2013-03-07 MED ORDER — PROPOFOL 10 MG/ML IV BOLUS
INTRAVENOUS | Status: DC | PRN
  Administered 2013-03-07: 160 mg via INTRAVENOUS

## 2013-03-07 MED ORDER — HYDROMORPHONE HCL PF 1 MG/ML IJ SOLN
0.2500 mg | INTRAMUSCULAR | Status: DC | PRN
  Administered 2013-03-07 (×6): 0.5 mg via INTRAVENOUS

## 2013-03-07 MED ORDER — LIDOCAINE HCL (CARDIAC) 20 MG/ML IV SOLN
INTRAVENOUS | Status: DC | PRN
  Administered 2013-03-07: 100 mg via INTRAVENOUS

## 2013-03-07 MED ORDER — OXYCODONE HCL 5 MG PO TABS
ORAL_TABLET | ORAL | Status: AC
  Filled 2013-03-07: qty 1

## 2013-03-07 MED ORDER — METHYLPREDNISOLONE ACETATE 40 MG/ML IJ SUSP
INTRAMUSCULAR | Status: DC | PRN
  Administered 2013-03-07: 12:00:00 via INTRAMUSCULAR

## 2013-03-07 MED ORDER — ALBUTEROL SULFATE (2.5 MG/3ML) 0.083% IN NEBU
2.5000 mg | INHALATION_SOLUTION | RESPIRATORY_TRACT | Status: DC | PRN
  Filled 2013-03-07: qty 3

## 2013-03-07 MED ORDER — METHYLPREDNISOLONE ACETATE 40 MG/ML IJ SUSP
INTRAMUSCULAR | Status: AC
  Filled 2013-03-07: qty 1

## 2013-03-07 MED ORDER — EPHEDRINE SULFATE 50 MG/ML IJ SOLN
INTRAMUSCULAR | Status: DC | PRN
  Administered 2013-03-07 (×2): 5 mg via INTRAVENOUS

## 2013-03-07 MED ORDER — ROCURONIUM BROMIDE 100 MG/10ML IV SOLN
INTRAVENOUS | Status: DC | PRN
  Administered 2013-03-07: 50 mg via INTRAVENOUS

## 2013-03-07 MED ORDER — MIDAZOLAM HCL 5 MG/5ML IJ SOLN
INTRAMUSCULAR | Status: DC | PRN
  Administered 2013-03-07: 2 mg via INTRAVENOUS

## 2013-03-07 MED ORDER — OXYCODONE HCL 5 MG PO TABS
5.0000 mg | ORAL_TABLET | Freq: Once | ORAL | Status: AC | PRN
  Administered 2013-03-07: 5 mg via ORAL

## 2013-03-07 MED ORDER — FENTANYL CITRATE 0.05 MG/ML IJ SOLN
INTRAMUSCULAR | Status: DC | PRN
  Administered 2013-03-07 (×5): 50 ug via INTRAVENOUS

## 2013-03-07 MED ORDER — ONDANSETRON HCL 4 MG/2ML IJ SOLN
INTRAMUSCULAR | Status: DC | PRN
  Administered 2013-03-07: 4 mg via INTRAVENOUS

## 2013-03-07 SURGICAL SUPPLY — 58 items
BANDAGE ELASTIC 4 VELCRO ST LF (GAUZE/BANDAGES/DRESSINGS) IMPLANT
BANDAGE ELASTIC 6 VELCRO ST LF (GAUZE/BANDAGES/DRESSINGS) IMPLANT
BANDAGE ESMARK 6X9 LF (GAUZE/BANDAGES/DRESSINGS) ×2 IMPLANT
BANDAGE GAUZE ELAST BULKY 4 IN (GAUZE/BANDAGES/DRESSINGS) ×3 IMPLANT
BIT DRILL 2.5X125 (BIT) ×1 IMPLANT
BIT DRILL 3.1X204 (BIT) ×1 IMPLANT
BLADE SURG 10 STRL SS (BLADE) ×3 IMPLANT
BLADE SURG 15 STRL LF DISP TIS (BLADE) ×2 IMPLANT
BLADE SURG 15 STRL SS (BLADE) ×3
BNDG CMPR 9X6 STRL LF SNTH (GAUZE/BANDAGES/DRESSINGS) ×2
BNDG CMPR MED 15X6 ELC VLCR LF (GAUZE/BANDAGES/DRESSINGS) ×2
BNDG COHESIVE 4X5 TAN STRL (GAUZE/BANDAGES/DRESSINGS) ×3 IMPLANT
BNDG ELASTIC 6X15 VLCR STRL LF (GAUZE/BANDAGES/DRESSINGS) ×1 IMPLANT
BNDG ESMARK 6X9 LF (GAUZE/BANDAGES/DRESSINGS) ×3
CLOTH BEACON ORANGE TIMEOUT ST (SAFETY) ×3 IMPLANT
COVER MAYO STAND STRL (DRAPES) ×3 IMPLANT
COVER SURGICAL LIGHT HANDLE (MISCELLANEOUS) ×3 IMPLANT
CUFF TOURNIQUET SINGLE 34IN LL (TOURNIQUET CUFF) IMPLANT
DRAPE C-ARM 42X72 X-RAY (DRAPES) ×3 IMPLANT
DRAPE C-ARMOR (DRAPES) IMPLANT
DRAPE INCISE IOBAN 66X45 STRL (DRAPES) ×3 IMPLANT
DRAPE U-SHAPE 47X51 STRL (DRAPES) ×3 IMPLANT
DRSG PAD ABDOMINAL 8X10 ST (GAUZE/BANDAGES/DRESSINGS) ×12 IMPLANT
ELECT REM PT RETURN 9FT ADLT (ELECTROSURGICAL) ×3
ELECTRODE REM PT RTRN 9FT ADLT (ELECTROSURGICAL) ×2 IMPLANT
GLOVE BIO SURGEON STRL SZ7.5 (GLOVE) ×3 IMPLANT
GLOVE BIOGEL PI IND STRL 8 (GLOVE) ×2 IMPLANT
GLOVE BIOGEL PI INDICATOR 8 (GLOVE) ×1
GOWN STRL NON-REIN LRG LVL3 (GOWN DISPOSABLE) ×6 IMPLANT
HYDOSET 3CC (Cement) ×1 IMPLANT
IMMOBILIZER KNEE 22 UNIV (SOFTGOODS) IMPLANT
KIT BASIN OR (CUSTOM PROCEDURE TRAY) ×3 IMPLANT
KIT ROOM TURNOVER OR (KITS) ×3 IMPLANT
MANIFOLD NEPTUNE II (INSTRUMENTS) ×3 IMPLANT
NS IRRIG 1000ML POUR BTL (IV SOLUTION) ×3 IMPLANT
PAD ARMBOARD 7.5X6 YLW CONV (MISCELLANEOUS) ×6 IMPLANT
PLATE PROX TIBIA LT 2H (Plate) ×1 IMPLANT
SCREW  LOCKING 4.0 (Screw) ×2 IMPLANT
SCREW CORTEX ST MATTA 3.5X28MM (Screw) ×2 IMPLANT
SCREW LOCKING 4.0X65MM (Screw) ×2 IMPLANT
SPONGE GAUZE 4X4 12PLY (GAUZE/BANDAGES/DRESSINGS) ×3 IMPLANT
SPONGE LAP 18X18 X RAY DECT (DISPOSABLE) ×3 IMPLANT
STAPLER VISISTAT 35W (STAPLE) IMPLANT
STOCKINETTE IMPERVIOUS LG (DRAPES) ×3 IMPLANT
SUCTION FRAZIER TIP 10 FR DISP (SUCTIONS) ×3 IMPLANT
SUT ETHILON 3 0 PS 1 (SUTURE) IMPLANT
SUT FIBERWIRE 2-0 18 17.9 3/8 (SUTURE) ×6
SUT MNCRL AB 4-0 PS2 18 (SUTURE) ×2 IMPLANT
SUT MON AB 2-0 CT1 36 (SUTURE) ×3 IMPLANT
SUT VIC AB 0 CT1 27 (SUTURE) ×6
SUT VIC AB 0 CT1 27XBRD ANBCTR (SUTURE) ×4 IMPLANT
SUTURE FIBERWR 2-0 18 17.9 3/8 (SUTURE) IMPLANT
TOWEL OR 17X24 6PK STRL BLUE (TOWEL DISPOSABLE) ×3 IMPLANT
TOWEL OR 17X26 10 PK STRL BLUE (TOWEL DISPOSABLE) ×6 IMPLANT
TRAY FOLEY CATH 14FR (SET/KITS/TRAYS/PACK) IMPLANT
TUBE CONNECTING 12X1/4 (SUCTIONS) ×3 IMPLANT
WATER STERILE IRR 1000ML POUR (IV SOLUTION) ×6 IMPLANT
YANKAUER SUCT BULB TIP NO VENT (SUCTIONS) ×3 IMPLANT

## 2013-03-07 NOTE — Interval H&P Note (Signed)
History and Physical Interval Note:  03/07/2013 7:34 AM  Adrienne [name]  has presented today for surgery, with the diagnosis of Tibial plateau fracture  The various methods of treatment have been discussed with the patient and family. After consideration of risks, benefits and other options for treatment, the patient has consented to  Procedure(s): OPEN REDUCTION INTERNAL FIXATION (ORIF) TIBIAL PLATEAU (Left) as a surgical intervention .  The patient's history has been reviewed, patient examined, no change in status, stable for surgery.  I have reviewed the patient's chart and labs.  Questions were answered to the patient's satisfaction.     Meliss Fleek, D

## 2013-03-07 NOTE — Interval H&P Note (Signed)
History and Physical Interval Note:  03/07/2013 10:31 AM  Adrienne [name]  has presented today for surgery, with the diagnosis of Tibial plateau fracture  The various methods of treatment have been discussed with the patient and family. After consideration of risks, benefits and other options for treatment, the patient has consented to  Procedure(s): OPEN REDUCTION INTERNAL FIXATION (ORIF) TIBIAL PLATEAU (Left) as a surgical intervention .  The patient's history has been reviewed, patient examined, no change in status, stable for surgery.  I have reviewed the patient's chart and labs.  Questions were answered to the patient's satisfaction.    I have reviewed her shoulder symptoms and performed exam. Her findings are consistent with Rotator tendonitis and pain with use of walker.. I will inject depomedrol 4:1 with marcaine into her Left subacromial space.    Domonic Kimball, D

## 2013-03-07 NOTE — Anesthesia Preprocedure Evaluation (Addendum)
Anesthesia Evaluation  Patient identified by MRN, date of birth, ID band Patient awake    Reviewed: Allergy & Precautions, H&P , NPO status , Patient's Chart, lab work & pertinent test results, reviewed documented beta blocker date and time   Airway Mallampati: II TM Distance: >3 FB Neck ROM: full    Dental   Pulmonary asthma , sleep apnea , former smoker,  breath sounds clear to auscultation        Cardiovascular negative cardio ROS  Rhythm:regular Rate:Normal     Neuro/Psych  Headaches, Seizures -, Well Controlled,  PSYCHIATRIC DISORDERS  Neuromuscular disease negative neurological ROS     GI/Hepatic Neg liver ROS, GERD-  Medicated and Controlled,  Endo/Other  diabetes, Oral Hypoglycemic Agents  Renal/GU negative Renal ROS  negative genitourinary   Musculoskeletal   Abdominal   Peds  Hematology  (+) anemia ,   Anesthesia Other Findings See surgeon's H&P   Reproductive/Obstetrics negative OB ROS                          Anesthesia Physical Anesthesia Plan  ASA: III  Anesthesia Plan: General   Post-op Pain Management:    Induction: Intravenous  Airway Management Planned: Oral ETT  Additional Equipment:   Intra-op Plan:   Post-operative Plan: Extubation in OR  Informed Consent: I have reviewed the patients History and Physical, chart, labs and discussed the procedure including the risks, benefits and alternatives for the proposed anesthesia with the patient or authorized representative who has indicated his/her understanding and acceptance.   Dental Advisory Given  Plan Discussed with: CRNA and Surgeon  Anesthesia Plan Comments:         Anesthesia Quick Evaluation

## 2013-03-07 NOTE — Anesthesia Procedure Notes (Signed)
Procedure Name: Intubation Date/Time: 03/07/2013 10:41 AM Performed by: Reine Just Pre-anesthesia Checklist: Patient identified, Emergency Drugs available, Suction available, Patient being monitored and Timeout performed Patient Re-evaluated:Patient Re-evaluated prior to inductionOxygen Delivery Method: Circle system utilized and Simple face mask Preoxygenation: Pre-oxygenation with 100% oxygen Intubation Type: IV induction Ventilation: Mask ventilation without difficulty Laryngoscope Size: Mac and 3 Grade View: Grade I Tube type: Oral Tube size: 7.0 mm Number of attempts: 1 Airway Equipment and Method: Patient positioned with wedge pillow and Stylet Placement Confirmation: ETT inserted through vocal cords under direct vision,  positive ETCO2 and breath sounds checked- equal and bilateral Secured at: 22 cm Tube secured with: Tape Dental Injury: Teeth and Oropharynx as per pre-operative assessment

## 2013-03-07 NOTE — Progress Notes (Signed)
[facility] PROGRESS NOTE  Adrienne [name] NWG:[id number] DOB: Sep 20, 1955 DOA: 03/02/2013 PCP: No PCP Per Patient  Assessment/Plan: 57 year old admitted 12-25 with new diagnosis of diabetes and left tibia fracture after mechanical fall.   1-Left tibial fracture:  S/p ORIF tibial plateau -Continue with  Lovenox for DVT prophylaxis, hold the day of surgery.  -Pain control is better after  d/c PCA and started Fentanyl patch 25 mcg q 72 hrs and Dilaudid 1 mg q 2 hr prn. -Docusate, Miralax  for Bowel regimen.   2-Diabetes mellitus type 2, uncontrolled, new onset: HB A-1c 7.5. Continue with SSI.   3-Anxiety: We'll continue her Xanax PRN.  4.Asthma:  albuterol nebs when necessary.   5.Depression: Continue home meds   6. 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 The patient says she has not had any seizures for over 5 years.   7.Cellulitisof left arm- Improving, continue with vancomycin. Doppler ultrasound is negative for DVT.  Code Status: Full Code.  Family Communication: Care discussed with patient.  Disposition Plan: remain inpatient.    Consultants:  Dr Eulah Pont.   Procedures:  none  Antibiotics:  none  HPI/Subjective: Patient seen and examined, s/p surgery, complains of pain. Left arm redness and swelling has improved.  Objective: Filed Vitals:   03/06/13 2143  BP: 92/60  Pulse: 52  Temp: 98.6 F (37 C)  Resp: 18    Intake/Output Summary (Last 24 hours) at 03/07/13 0542 Last data filed at 03/07/13 0022  Gross per 24 hour  Intake    615 ml  Output    500 ml  Net    115 ml   Filed Weights   03/06/13 1317  Weight: 77 kg (169 lb 12.1 oz)    Exam:  Physical Exam: Head: Normocephalic, atraumatic.  Eyes: No signs of jaundice, EOMI Nose: Mucous membranes dry.  Throat: Oropharynx nonerythematous, no exudate appreciated.  Neck: supple,No deformities, masses, or  tenderness noted. Lungs: Normal respiratory effort. B/L Clear to auscultation, no crackles or wheezes.  Heart: Regular RR. S1 and S2 normal  Abdomen: BS normoactive. Soft, Nondistended, non-tender.  Extremities:Improved erythema of left arm   Data Reviewed: Basic Metabolic Panel:  Recent Labs Lab 03/02/13 1550 03/02/13 2258 03/03/13 0530 03/04/13 0651 03/05/13 0830  NA 135  --  141 137 136  K 4.0  --  3.7 3.8 3.9  CL 97  --  103 102 100  CO2 21  --  28 25 28   GLUCOSE 216*  --  191* 149* 195*  BUN 13  --  12 8 9   CREATININE 0.64  --  0.64 0.60 0.72  CALCIUM 9.0  --  8.4 8.0* 8.2*  MG  --  2.0  --   --   --   PHOS  --  4.1  --   --   --    Liver Function Tests:  Recent Labs Lab 03/03/13 0530  AST 29  ALT 21  ALKPHOS 56  BILITOT 0.5  PROT 6.0  ALBUMIN 2.9*   No results found for this basename: LIPASE, AMYLASE,  in the last 168 hours No results found for this basename: AMMONIA,  in the last 168 hours CBC:  Recent Labs Lab 03/02/13 1550 03/03/13 0530 03/04/13 0651 03/05/13 0830 03/06/13 1450  WBC 14.2* 10.6* 11.5* 11.5* 10.3  NEUTROABS 9.3*  --   --   --   --  HGB 15.1* 13.7 13.2 12.6 13.2  HCT 45.1 41.3 40.6 38.7 40.4  MCV 92.6 93.7 95.5 95.6 95.7  PLT 249 230 206 216 234   Cardiac Enzymes: No results found for this basename: CKTOTAL, CKMB, CKMBINDEX, TROPONINI,  in the last 168 hours BNP (last 3 results) No results found for this basename: PROBNP,  in the last 8760 hours CBG:  Recent Labs Lab 03/05/13 2148 03/06/13 0653 03/06/13 1112 03/06/13 1622 03/06/13 2207  GLUCAP 113* 114* 125* 136* 127*    Recent Results (from the past 240 hour(s))  SURGICAL PCR SCREEN     Status: None   Collection Time    03/03/13  1:23 AM      Result Value Range Status   MRSA, PCR NEGATIVE  NEGATIVE Final   Staphylococcus aureus NEGATIVE  NEGATIVE Final   Comment:            The Xpert SA Assay (FDA     approved for NASAL specimens     in patients over 21 years  of age),     is one component of     a comprehensive surveillance     program.  Test performance has     been validated by [employer] for patients greater     than or equal to 21 year old.     It is not intended     to diagnose infection nor to     guide or monitor treatment.     Studies: No results found.  Scheduled Meds: . chlorhexidine  60 mL Topical Once  . docusate sodium  100 mg Oral BID  . [START ON 03/08/2013] enoxaparin (LOVENOX) injection  40 mg Subcutaneous Daily  . fentaNYL  25 mcg Transdermal Q72H  . furosemide  20 mg Oral Daily  . insulin aspart  0-15 Units Subcutaneous TID WC  . methylphenidate  10 mg Oral BID  . pantoprazole  40 mg Oral Daily  . polyethylene glycol  17 g Oral BID  . sodium chloride  3 mL Intravenous Q12H  . vancomycin  750 mg Intravenous Q12H  . venlafaxine XR  150 mg Oral Q breakfast   Continuous Infusions: . sodium chloride 125 mL/hr at 03/06/13 2150    Principal Problem:   Fracture of left tibial plateau Active Problems:   DIABETES MELLITUS, TYPE II, UNCONTROLLED   ANXIETY   DEPRESSION   RESTLESS LEG SYNDROME   ALLERGIC RHINITIS   ASTHMA   GERD   ARTHRITIS   SEIZURE DISORDER   SLEEP APNEA   Headache(784.0)   Barrett's esophagus   Left tibial fracture   Hematoma    Time spent: 30 minutes.     [facility]  [facility] Pager [phone]. If 7PM-7AM, please contact night-coverage at [email or web address], password [facility] 03/07/2013, 5:42 AM  LOS: 5 days

## 2013-03-07 NOTE — Anesthesia Postprocedure Evaluation (Signed)
  Anesthesia Post-op Note  Patient: Adrienne [name]  Procedure(s) Performed: Procedure(s): OPEN REDUCTION INTERNAL FIXATION (ORIF) TIBIAL PLATEAU (Left) SHOULDER INJECTION ; Depomedrol + 0.25% Marcaine mixture 4:1 (Left)  Patient Location: PACU  Anesthesia Type:General  Level of Consciousness: awake and alert   Airway and Oxygen Therapy: Patient Spontanous Breathing  Post-op Pain: mild  Post-op Assessment: Post-op Vital signs reviewed, Patient's Cardiovascular Status Stable, Respiratory Function Stable, Patent Airway, No signs of Nausea or vomiting and Pain level controlled  Post-op Vital Signs: Reviewed and stable  Complications: No apparent anesthesia complications

## 2013-03-07 NOTE — Op Note (Signed)
03/02/2013 - 03/07/2013  11:56 AM  PATIENT:  Adrienne [name]    PRE-OPERATIVE DIAGNOSIS:  Tibial plateau fracture  POST-OPERATIVE DIAGNOSIS:  Same  PROCEDURE:  OPEN REDUCTION INTERNAL FIXATION (ORIF) TIBIAL PLATEAU, SHOULDER INJECTION ; Depomedrol + 0.25% Marcaine mixture 4:1  SURGEON:  Ataya Murdy, D, MD  ASSISTANT: Janace Litten, OPA  ANESTHESIA:   Gen  PREOPERATIVE INDICATIONS:  [name] is a  57 y.o. female with a diagnosis of Tibial plateau fracture who failed conservative measures and elected for surgical management.    The risks benefits and alternatives were discussed with the patient preoperatively including but not limited to the risks of infection, bleeding, nerve injury, cardiopulmonary complications, the need for revision surgery, among others, and the patient was willing to proceed.  OPERATIVE IMPLANTS: Stryker ant lat plate  OPERATIVE FINDINGS: stable medial side  BLOOD LOSS: min  COMPLICATIONS: none  TOURNIQUET TIME:  OPERATIVE PROCEDURE:  Patient was identified in the preoperative holding area and site was marked by me He was transported to the operating theater and placed on the table in supine position taking care to pad all bony prominences. After a preincinduction time out anesthesia was induced. The Left lower extremity was prepped and draped in normal sterile fashion and a pre-incision timeout was performed. [name] received Vancomycin for preoperative antibiotics.   I previously discussed the patient that given her history of a rotator cuff tear and subacromial impingement she is having significant significant pain with use a walker and she like to go forward with Depo-Medrol 41 with Marcaine injection to the left subacromial space. After appropriate timeout performed S. complication using alcohol wipe for prep.  The left lower extremity was then prepped and draped in normal sterile fashion leg was exsanguinated and tourniquet was  inflated to 50 mm of mercury. I made it anterior lateral approach to the proximal tibia in an S-shaped fashion exposing the fascia over the anterior compartment of the joint line. I incised the anterior aspect of the anterior compartment fascia and elevated the muscle off the tibial crest and posterior from that. I then incised the IT band and [place] fashion as well exposing the lateral capsule. Incise the lateral capsule of the Beath the lateral meniscus to be intact. There was a small lateral meniscus tear from the capsule. I then passed 22.0 Monocryl sutures the repair the meniscus tear and stabilizing the meniscus for later repair back to the plate. These were elevated superiorly I was able to pass my finger and a Cobb into the joint and felt a depressed posterior lateral aspect. There was no anterior cortical fracture with which to view this so I elected to make a small window in the flare of the tibia using a Cobb. Tenodesis ball-tipped elevate the depressed portion of the plateau toes it would feel a step off at the repair. I then. A short anterior lateral locking plate across the joint was happy with maintenance of reduction. I placed one shaft screw to hold the plate down to the shaft and then placed 2 locking screws I evaluated the reduction was very happy with it I placed the remainder of the proximal locking screws without complication. Placed the kickstand locking screw again was happy with its location I then injected 3 cc of Hydrocet through the corticotomy at made beneath the depressed portion of the posterior lateral tibial plateau. I stress the knee to varus and valgus stressing and felt it was stable I elected at this  point to not place a medial plate as the medial fracture was very anterior in a very small portion of the plateau itself I do not feel that it would affect the stability of the knee. We then placed one more shaft screw was happy with the bite on all the. I thoroughly irrigated the wound  I then closed the meniscus after repairing it down to the plate itself close the capsule over top of that I then closed the flap and the IT band and anterior compartment. Wounds and closed with the Monocryl stitches. Sterile dressing was applied she was placed in the immobilizer to the PACU in stable condition.  POST OPERATIVE PLAN: She'll be nonweightbearing and stated immobilizer. Post left lower extremity. Weightbearing activity as tolerated on left shoulder. Lovenox for DVT prophylaxis.

## 2013-03-07 NOTE — Progress Notes (Signed)
I participated in the care of this patient and agree with the above history, physical and evaluation. I performed a review of the history and a physical exam as detailed   Luz Burcher Daniel Bernell Sigal MD  

## 2013-03-07 NOTE — H&P (View-Only) (Signed)
   ORTHOPAEDIC CONSULTATION  REQUESTING PHYSICIAN: Allison L Wood, MD  Chief Complaint: L plateau fracture  HPI: Adrienne [name] is a 57 y.o. female who complains of  Fall down a few steps yesterday. She was released home for Tue OR but could not mobilize and returned to ED  Past Medical History  Diagnosis Date  . Allergic rhinitis   . Anemia     leukocytosis/thrombocytosis--Dr. Neijstrom  . Asthma   . Depression     ? Bipolar Disorder--Dr, Williams  . GERD (gastroesophageal reflux disease)     Barrett's esophagus  . Low back pain   . Seizure disorder     ? absence  . Macular degeneration, bilateral     Dry-Dr. Haines  . Arthritis   . Hx of migraines   . Brain injury     Closed head  . OSA (obstructive sleep apnea)   . RLS (restless legs syndrome)   . NIDDM (non-insulin dependent diabetes mellitus)   . Status post right hip replacement   . Status post rotator cuff surgery    Past Surgical History  Procedure Laterality Date  . Colonoscopy  12/08    TCS/EGD: + Barrett's,+ gastritis, friable anal canal, hyperplastic polyp  . Appendectomy    . Vesicovaginal fistula closure w/ tah    . Rotator cuff repair      Bilaterally  . Revision total hip arthroplasty      Right  . Tonsillectomy     History   Social History  . Marital Status: Divorced    Spouse Name: N/A    Number of Children: 3  . Years of Education: 14   Occupational History  . on disability     due to head injury, 2005   Social History Main Topics  . Smoking status: Former Smoker -- 1.00 packs/day    Types: Cigarettes    Start date: 03/03/1971    Quit date: 12/01/2012  . Smokeless tobacco: None  . Alcohol Use: No  . Drug Use: No  . Sexual Activity: Yes   Other Topics Concern  . None   Social History Narrative  . None   Family History  Problem Relation Age of Onset  . Atrial fibrillation Mother   . Diabetes Maternal Grandfather   . Cancer Mother   . Cancer Maternal Grandfather   .  Thyroid disease Son    Allergies  Allergen Reactions  . Codeine     REACTION: headaches  . Morphine And Related Itching  . Penicillins Rash  . Sulfonamide Derivatives Rash   Prior to Admission medications   Medication Sig Start Date End Date Taking? Authorizing Provider  ALPRAZolam (XANAX) 1 MG tablet Take 1 mg by mouth at bedtime as needed.     Yes Historical Provider, MD  aspirin 81 MG tablet Take 81 mg by mouth daily.     Yes Historical Provider, MD  BIOTIN PO Take 1 capsule by mouth daily.   Yes Historical Provider, MD  Cholecalciferol (VITAMIN D3) 400 UNITS CAPS Take by mouth.     Yes Historical Provider, MD  Cinnamon 500 MG TABS Take by mouth.     Yes Historical Provider, MD  Cyanocobalamin (VITAMIN B 12 PO) Take by mouth.     Yes Historical Provider, MD  cyclobenzaprine (FLEXERIL) 10 MG tablet Take 10 mg by mouth at bedtime.   Yes Historical Provider, MD  desvenlafaxine (PRISTIQ) 100 MG 24 hr tablet Take 100 mg by mouth daily.       Yes Historical Provider, MD  fish oil-omega-3 fatty acids 1000 MG capsule Take 2 g by mouth daily.     Yes Historical Provider, MD  Flaxseed, Linseed, (FLAX SEED OIL PO) Take by mouth.     Yes Historical Provider, MD  furosemide (LASIX) 20 MG tablet Take 20 mg by mouth daily.   Yes Historical Provider, MD  Melatonin 10 MG CAPS Take 10 mg by mouth at bedtime.   Yes Historical Provider, MD  methylphenidate (RITALIN) 10 MG tablet Take 10 mg by mouth 2 (two) times daily.     Yes Historical Provider, MD  Multiple Vitamin (MULTIVITAMIN) capsule Take 1 capsule by mouth daily.     Yes Historical Provider, MD  omeprazole (PRILOSEC) 20 MG capsule Take 20 mg by mouth daily.     Yes Historical Provider, MD  oxyCODONE-acetaminophen (PERCOCET/ROXICET) 5-325 MG per tablet Take 2 tablets by mouth every 4 (four) hours as needed for severe pain. 03/02/13  Yes Olga M Otter, MD  vitamin E 1000 UNIT capsule Take 1,000 Units by mouth daily.     Yes Historical Provider, MD    Dg Hip Complete Right  03/02/2013   CLINICAL DATA:  Fall. Right hip pain. Hip arthroplasty in near 2010.  EXAM: RIGHT HIP - COMPLETE 2+ VIEW  COMPARISON:  12/16/2007 radiographs.  FINDINGS: Uncomplicated right total hip arthroplasty. There is mild lateral migration of the distal femoral stem. No periprosthetic fracture is present. Acetabular cup appears within normal limits. The obturator rings are intact. Moderate to severe left hip osteoarthritis is incidentally noted. L4-L5 right-sided spondylosis.  IMPRESSION: Uncomplicated right total hip arthroplasty. No acute osseous abnormality.   Electronically Signed   By: Geoffrey  Lamke M.D.   On: 03/02/2013 19:18   Ct Knee Left Wo Contrast  03/02/2013   CLINICAL DATA:  Status post fall; left knee pain. Tibial fracture noted on radiograph.  EXAM: CT OF THE LEFT KNEE WITHOUT CONTRAST  TECHNIQUE: Multidetector CT imaging was performed according to the standard protocol. Multiplanar CT image reconstructions were also generated.  COMPARISON:  None.  FINDINGS: There is a near vertical fracture extending through the lateral tibial plateau, with mild associated impaction. There is focal depression at the posterior aspect of the lateral tibial plateau adjacent to the tibiofibular articulation, measuring 7 mm, compatible with a Schatzker type 2 fracture. There is slight comminution at the site of depression, and mild depression is noted more anteriorly. However, the somewhat complex fracture line extends across the midportion of the medial tibial plateau and anterior aspect of the tibial spine, rather than through the anterior aspect of the lateral tibial plateau, with slight comminution at the tibial spine. As a result, the dominant fracture line would be considered incomplete.  The anterior cruciate ligament and posterior cruciate ligament appear grossly intact, though the anterior cruciate ligament appears to insert on a minimally displaced tibial spine fragment. The  menisci are not well assessed, though no definite extrusion is seen. The medial collateral ligament and lateral collateral ligament complex are grossly unremarkable, though part of the lateral collateral ligament complex extends to the depressed tibial plateau fragment.  A large lipohemarthrosis is noted. No additional fractures are seen. The quadriceps and patellar tendons are unremarkable in appearance.  IMPRESSION: 1. Somewhat unusual Schatzker type 2 fracture involving both the medial and lateral tibial plateau, and the tibial spine. Focal depression at the posterior aspect of the lateral tibial plateau adjacent to the tibiofibular articulation, measuring 7 mm, with slight comminution at the   site of depression, and mild depression more anteriorly. The somewhat complex fracture line extends across the midportion of the medial tibial plateau and anterior aspect of the tibial spine, rather than through the anterior aspect of the lateral tibial plateau, and demonstrates slight comminution at the tibial spine. The dominant fracture line appears incomplete. 2. Anterior cruciate ligament appears to insert on a minimally displaced tibial spine fragment. Menisci not well assessed. 3. Large lipohemarthrosis noted.   Electronically Signed   By: Jeffery  Chang M.D.   On: 03/02/2013 04:10   Dg Knee Complete 4 Views Left  03/02/2013   CLINICAL DATA:  Status post fall down steps, with left knee pain.  EXAM: LEFT KNEE - COMPLETE 4+ VIEW  COMPARISON:  None.  FINDINGS: Vague lucencies are noted at both tibial plateaus, with apparent disruption of the articular surface on both sides and mild flattening of the lateral tibial plateau. This is suspicious for a Schatzker type 5 tibial plateau fracture involving both condyles, though the distal extension of the fracture lines is not well characterized.  A associated large lipohemarthrosis is seen. The fracture may involve the tibial spine, given the slightly unusual appearance  along the tibial spine on the lateral view. The distal femur appears intact. The patella is grossly unremarkable in appearance. No fibular fracture is identified.  IMPRESSION: Disruption of the articular surface at both the medial and lateral tibial plateaus, with mild flattening of the lateral tibial plateau. This is suspicious for a Schatzker type 5 tibial plateau fracture involving both condyles, though the distal extension of the fracture lines is not well characterized. Associated large lipohemarthrosis seen. Possible additional fracture line involving the tibial spine.   Electronically Signed   By: Jeffery  Chang M.D.   On: 03/02/2013 02:22    Positive ROS: All other systems have been reviewed and were otherwise negative with the exception of those mentioned in the HPI and as above.  Labs cbc  Recent Labs  03/02/13 1550  WBC 14.2*  HGB 15.1*  HCT 45.1  PLT 249    Labs inflam No results found for this basename: ESR, CRP,  in the last 72 hours  Labs coag No results found for this basename: INR, PT, PTT,  in the last 72 hours   Recent Labs  03/02/13 1550  NA 135  K 4.0  CL 97  CO2 21  GLUCOSE 216*  BUN 13  CREATININE 0.64  CALCIUM 9.0    Physical Exam: Filed Vitals:   03/02/13 1847  BP:   Pulse: 87  Temp:   Resp:    General: Alert, no acute distress Cardiovascular: No pedal edema Respiratory: No cyanosis, no use of accessory musculature GI: No organomegaly, abdomen is soft and non-tender Skin: No lesions in the area of chief complaint Neurologic: Sensation intact distally Psychiatric: Patient is competent for consent with normal mood and affect Lymphatic: No axillary or cervical lymphadenopathy  MUSCULOSKELETAL:  Swelling and TTP at plateau, compartments soft, NVI distally Other extremities are atraumatic with painless ROM and NVI.  Assessment: L plateau fx  Plan: ORIF on 12/30 Weight Bearing Status: NWB Elevate  PT VTE px: SCD's and hold chemical  the morning of 12/30   Micala Saltsman, D, MD Cell [phone]   03/02/2013 8:24 PM     

## 2013-03-07 NOTE — OR Nursing (Signed)
03/07/2013: 1026 : OR Nursing Note - Dr. Eulah Pont and patient discussed injecting left shoulder with Depo-medrol in pre-op holding area. Orlene Erm was witnessed by [employer], Scientist, clinical (histocompatibility and immunogenetics).  Dr. Eulah Pont has pre-op note regarding injection for left shoulder. Injection was not added to consent because patient had been sedated with Versed before coming back to OR, when I was informed of added procedure.

## 2013-03-07 NOTE — Transfer of Care (Signed)
Immediate Anesthesia Transfer of Care Note  Patient: Adrienne [name]  Procedure(s) Performed: Procedure(s): OPEN REDUCTION INTERNAL FIXATION (ORIF) TIBIAL PLATEAU (Left) SHOULDER INJECTION ; Depomedrol + 0.25% Marcaine mixture 4:1 (Left)  Patient Location: PACU  Anesthesia Type:General  Level of Consciousness: awake and alert   Airway & Oxygen Therapy: Patient Spontanous Breathing and Patient connected to nasal cannula oxygen  Post-op Assessment: Report given to PACU RN and Post -op Vital signs reviewed and stable  Post vital signs: Reviewed and stable  Complications: No apparent anesthesia complications

## 2013-03-07 NOTE — Preoperative (Signed)
Beta Blockers   Reason not to administer Beta Blockers:Not Applicable 

## 2013-03-08 LAB — GLUCOSE, CAPILLARY
Glucose-Capillary: 128 mg/dL — ABNORMAL HIGH (ref 70–99)
Glucose-Capillary: 142 mg/dL — ABNORMAL HIGH (ref 70–99)
Glucose-Capillary: 98 mg/dL (ref 70–99)

## 2013-03-08 NOTE — Progress Notes (Signed)
I participated in the care of this patient and agree with the above history, physical and evaluation. I performed a review of the history and a physical exam as detailed   Mariyana Fulop Daniel Loma Dubuque MD  

## 2013-03-08 NOTE — Progress Notes (Signed)
Patient ID: Adrienne [name], female   DOB: [birth year], 57 y.o.   MRN: [id number]     Subjective:  Patient reports pain as mild to moderate.  Patient sitting up in bed eating breakfast.  Pain controled at time of visit  Objective:   VITALS:   Filed Vitals:   03/07/13 1430 03/07/13 1440 03/07/13 2157 03/08/13 0550  BP:  102/56 125/66 122/61  Pulse: 61 74 73 75  Temp: 98.2 F (36.8 C)  100.3 F (37.9 C) 98.8 F (37.1 C)  TempSrc:   Oral Oral  Resp: 11 17 19 18   Height:      Weight:      SpO2: 95% 92% 90% 92%    ABD soft Sensation intact distally Dorsiflexion/Plantar flexion intact Incision: dressing C/D/I and no drainage   Lab Results  Component Value Date   WBC 12.6* 03/07/2013   HGB 12.8 03/07/2013   HCT 39.2 03/07/2013   MCV 96.1 03/07/2013   PLT 232 03/07/2013     Assessment/Plan: 1 Day Post-Op   Principal Problem:   Fracture of left tibial plateau Active Problems:   DIABETES MELLITUS, TYPE II, UNCONTROLLED   ANXIETY   DEPRESSION   RESTLESS LEG SYNDROME   ALLERGIC RHINITIS   ASTHMA   GERD   ARTHRITIS   SEIZURE DISORDER   SLEEP APNEA   Headache(784.0)   Barrett's esophagus   Left tibial fracture   Hematoma   Cellulitis   Advance diet Up with therapy NWB left lower ext Dry dressing PRN Continue plan per medicine    Haskel Khan 03/08/2013, 8:02 AM   Margarita Rana MD [phone]

## 2013-03-08 NOTE — Evaluation (Signed)
Occupational Therapy Evaluation Patient Details Name: Adrienne [name] MRN: [id number] DOB: [birth year] Today's Date: 03/08/2013 Time: 4098-1191 OT Time Calculation (min): 22 min  OT Assessment / Plan / Recommendation History of present illness Admitted with left tibial plateau fx surgically repaired on 03/07/13. Pt is NWB on LLE.    Clinical Impression   Eval limited due to pt reports of 8/10 pain; pain meds received before OT session. Pt declined transfers from recliner to [facility]. Pt demos decline in function with ADLs and ADL mobility safety and would benefit from acute OT services to address impairments to increase level of function and safety. Pt plans to d/c to her mother;s home where she will have sup and assist as she lives alone    OT Assessment  Patient needs continued OT Services    Follow Up Recommendations  Home health OT;Supervision/Assistance - 24 hour    Barriers to Discharge   none  Equipment Recommendations       Recommendations for Other Services    Frequency  Min 2X/week    Precautions / Restrictions Precautions Precautions: Fall Required Braces or Orthoses: Knee Immobilizer - Left Knee Immobilizer - Left: On at all times Restrictions LLE Weight Bearing: Non weight bearing   Pertinent Vitals/Pain 8/10 L LE pain, 6/10 L UE pain with AROM    ADL  Grooming: Performed;Wash/dry hands;Wash/dry face;Supervision/safety;Set up Where Assessed - Grooming: Supported sitting Upper Body Bathing: Simulated;Minimal assistance;Moderate assistance Lower Body Bathing: Simulated;Maximal assistance Upper Body Dressing: Performed;Minimal assistance;Moderate assistance Lower Body Dressing: +1 Total assistance Transfers/Ambulation Related to ADLs: pt declined due to 8/10 L LE pain, just recieved pain meds. Per PT note, pt min A with transfers ADL Comments: educated pt on DME and ADL A/E. Pt familiar with DME due to previous ortho surgeries    OT Diagnosis: Generalized  weakness;Acute pain  OT Problem List: Decreased strength;Decreased knowledge of use of DME or AE;Decreased activity tolerance;Impaired balance (sitting and/or standing);Pain;Impaired UE functional use OT Treatment Interventions: Self-care/ADL training;Therapeutic exercise;Patient/family education;Neuromuscular education;Balance training;Therapeutic activities;DME and/or AE instruction   OT Goals(Current goals can be found in the care plan section) Acute Rehab OT Goals Patient Stated Goal: pt would like to go to her mother's house at DC OT Goal Formulation: With patient Time For Goal Achievement: 03/15/13 Potential to Achieve Goals: Good ADL Goals Pt Will Perform Grooming: with supervision;with set-up;sitting Pt Will Perform Upper Body Bathing: with min assist;with min guard assist;sitting Pt Will Perform Lower Body Bathing: with mod assist;with caregiver independent in assisting;with adaptive equipment Pt Will Perform Upper Body Dressing: with min assist;with min guard assist;sitting Pt Will Perform Lower Body Dressing: with max assist;with mod assist;with adaptive equipment;with caregiver independent in assisting Pt Will Transfer to Toilet: with min assist;with min guard assist;bedside commode  Visit Information  Last OT Received On: 03/08/13 Assistance Needed: +1 History of Present Illness: Admitted with left tibial plateau fx surgically repaired on 03/07/13. Pt is NWB on LLE.        Prior Functioning     Home Living Family/patient expects to be discharged to:: Private residence Living Arrangements: Alone Available Help at Discharge: Family;Available 24 hours/day Type of Home: House Home Access: Level entry Home Layout: One level Home Equipment: Emergency planning/management officer - 2 wheels;Bedside commode;Cane - single point;Crutches Prior Function Level of Independence: Independent Communication Communication: No difficulties Dominant Hand: Right         Vision/Perception Vision -  History Baseline Vision: Wears contacts Patient Visual Report: No change from baseline Perception Perception: Within  Functional Limits   Cognition  Cognition Arousal/Alertness: Awake/alert Behavior During Therapy: WFL for tasks assessed/performed Overall Cognitive Status: Within Functional Limits for tasks assessed    Extremity/Trunk Assessment Upper Extremity Assessment Upper Extremity Assessment: Overall WFL for tasks assessed;Generalized weakness;LUE deficits/detail LUE Deficits / Details: AROM WFL, weakness/pain  LUE: Unable to fully assess due to pain Lower Extremity Assessment Lower Extremity Assessment: Defer to PT evaluation     Mobility Bed Mobility Bed Mobility: Not assessed Details for Bed Mobility Assistance: pt up in recliner Transfers Transfers: Not assessed Details for Transfer Assistance: pt declined due to 8/10 L LE pain, just recieved pain meds     Exercise     Balance Balance Balance Assessed: Yes Static Sitting Balance Static Sitting - Balance Support: Left upper extremity supported;Feet supported Static Sitting - Level of Assistance: 5: Stand by assistance Dynamic Sitting Balance Dynamic Sitting - Balance Support: During functional activity;Feet unsupported;Left upper extremity supported Dynamic Sitting - Level of Assistance: 5: Stand by assistance   End of Session OT - End of Session Activity Tolerance: Patient limited by pain Patient left: in chair;with call bell/phone within reach  GO     Galen Manila 03/08/2013, 2:41 PM

## 2013-03-08 NOTE — Progress Notes (Signed)
[facility] PROGRESS NOTE  Adrienne [name]:[id number] DOB: 02-01-1956 DOA: 03/02/2013 PCP: No PCP Per Patient  Assessment/Plan: 57 year old admitted 12-25 with new diagnosis of diabetes and left tibia fracture after mechanical fall.   1-Left tibial fracture:  S/p ORIF tibial plateau -Continue with  Lovenox for DVT prophylaxis, hold the day of surgery.  -Pain control is better after  d/c PCA and started Fentanyl patch 25 mcg q 72 hrs and Dilaudid 1 mg q 2 hr prn. -Docusate, Miralax  for Bowel regimen.   2-Diabetes mellitus type 2, uncontrolled, new onset: HB A-1c 7.5. Continue with SSI.   3-Anxiety: We'll continue her Xanax PRN.  4.Asthma:  albuterol nebs when necessary.   5.Depression: Continue home meds   6. 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 The patient says she has not had any seizures for over 5 years.   7.Cellulitisof left arm- Improving, continue with vancomycin. Doppler ultrasound is negative for DVT.  Code Status: Full Code.  Family Communication: Care discussed with patient.  Disposition Plan: remain inpatient.    Consultants:  Dr Eulah Pont.   Procedures:  none  Antibiotics:  none  HPI/Subjective: Patient seen and examined, s/p surgery, complains of pain. Left arm redness and swelling has improved.  Objective: Filed Vitals:   03/08/13 1634  BP: 90/48  Pulse: 69  Temp: 98.4 F (36.9 C)  Resp: 16    Intake/Output Summary (Last 24 hours) at 03/08/13 1842 Last data filed at 03/08/13 1500  Gross per 24 hour  Intake 4998.33 ml  Output   4400 ml  Net 598.33 ml   Filed Weights   03/06/13 1317  Weight: 77 kg (169 lb 12.1 oz)    Exam:  Physical Exam: Head: Normocephalic, atraumatic.  Eyes: No signs of jaundice, EOMI Nose: Mucous membranes dry.  Throat: Oropharynx nonerythematous, no exudate appreciated.  Neck: supple,No deformities, masses, or  tenderness noted. Lungs: Normal respiratory effort. B/L Clear to auscultation, no crackles or wheezes.  Heart: Regular RR. S1 and S2 normal  Abdomen: BS normoactive. Soft, Nondistended, non-tender.  Extremities:Improved erythema of left arm   Data Reviewed: Basic Metabolic Panel:  Recent Labs Lab 03/02/13 1550 03/02/13 2258 03/03/13 0530 03/04/13 0651 03/05/13 0830 03/07/13 1600  NA 135  --  141 137 136  --   K 4.0  --  3.7 3.8 3.9  --   CL 97  --  103 102 100  --   CO2 21  --  28 25 28   --   GLUCOSE 216*  --  191* 149* 195*  --   BUN 13  --  12 8 9   --   CREATININE 0.64  --  0.64 0.60 0.72 0.66  CALCIUM 9.0  --  8.4 8.0* 8.2*  --   MG  --  2.0  --   --   --   --   PHOS  --  4.1  --   --   --   --    Liver Function Tests:  Recent Labs Lab 03/03/13 0530  AST 29  ALT 21  ALKPHOS 56  BILITOT 0.5  PROT 6.0  ALBUMIN 2.9*   No results found for this basename: LIPASE, AMYLASE,  in the last 168 hours No results found for this basename: AMMONIA,  in the last 168 hours CBC:  Recent Labs Lab 03/02/13 1550 03/03/13 0530 03/04/13 0651 03/05/13 0830  03/06/13 1450 03/07/13 1600  WBC 14.2* 10.6* 11.5* 11.5* 10.3 12.6*  NEUTROABS 9.3*  --   --   --   --   --   HGB 15.1* 13.7 13.2 12.6 13.2 12.8  HCT 45.1 41.3 40.6 38.7 40.4 39.2  MCV 92.6 93.7 95.5 95.6 95.7 96.1  PLT 249 230 206 216 234 232   Cardiac Enzymes: No results found for this basename: CKTOTAL, CKMB, CKMBINDEX, TROPONINI,  in the last 168 hours BNP (last 3 results) No results found for this basename: PROBNP,  in the last 8760 hours CBG:  Recent Labs Lab 03/07/13 1247 03/07/13 1625 03/07/13 2156 03/08/13 0701 03/08/13 1232  GLUCAP 157* 143* 133* 128* 142*    Recent Results (from the past 240 hour(s))  SURGICAL PCR SCREEN     Status: None   Collection Time    03/03/13  1:23 AM      Result Value Range Status   MRSA, PCR NEGATIVE  NEGATIVE Final   Staphylococcus aureus NEGATIVE  NEGATIVE Final    Comment:            The Xpert SA Assay (FDA     approved for NASAL specimens     in patients over 70 years of age),     is one component of     a comprehensive surveillance     program.  Test performance has     been validated by [employer] for patients greater     than or equal to 58 year old.     It is not intended     to diagnose infection nor to     guide or monitor treatment.     Studies: Dg Tibia/fibula Left  03/07/2013   CLINICAL DATA:  Tibial plateau fracture.   ORIF.  EXAM: LEFT TIBIA AND FIBULA - 2 VIEW; DG C-ARM 1-60 MIN  COMPARISON:  CT 03/02/2013.  FINDINGS: Patient has undergone fixation of the lateral tibial plateau fracture using a lateral plate and screws. There is restoration of the articular surface of the lateral tibial plateau. No residual depression of the articular surface or complications are identified.  IMPRESSION: Tibial ORIF for plateau fracture without demonstrated complication.   Electronically Signed   By: Roxy Horseman M.D.   On: 03/07/2013 14:19   Dg Knee Left Port  03/07/2013   CLINICAL DATA:  Postop left knee internal fixation  EXAM: PORTABLE LEFT KNEE - 1-2 VIEW  COMPARISON:  None.  FINDINGS: There is a lateral proximal tibial side plate with multiple interlocking screws transfixing a lateral tibial plateau fracture which is in anatomic alignment. There is no hardware failure or complication. There is no other fracture or dislocation. There are postsurgical changes in the surrounding soft tissues.  IMPRESSION: Proximal left lateral tibial orthopedic hardware in satisfactory position without a failure or complication.   Electronically Signed   By: Elige Ko   On: 03/07/2013 13:52   Dg C-arm 1-60 Min  03/07/2013   CLINICAL DATA:  Tibial plateau fracture.   ORIF.  EXAM: LEFT TIBIA AND FIBULA - 2 VIEW; DG C-ARM 1-60 MIN  COMPARISON:  CT 03/02/2013.  FINDINGS: Patient has undergone fixation of the lateral tibial plateau fracture using a lateral plate  and screws. There is restoration of the articular surface of the lateral tibial plateau. No residual depression of the articular surface or complications are identified.  IMPRESSION: Tibial ORIF for plateau fracture without demonstrated complication.   Electronically Signed  By: Roxy Horseman M.D.   On: 03/07/2013 14:19    Scheduled Meds: . docusate sodium  100 mg Oral BID  . enoxaparin (LOVENOX) injection  40 mg Subcutaneous Q24H  . fentaNYL  25 mcg Transdermal Q72H  . furosemide  20 mg Oral Daily  . insulin aspart  0-15 Units Subcutaneous TID WC  . methylphenidate  10 mg Oral BID  . pantoprazole  40 mg Oral Daily  . polyethylene glycol  17 g Oral BID  . sodium chloride  3 mL Intravenous Q12H  . vancomycin  750 mg Intravenous Q12H  . venlafaxine XR  150 mg Oral Q breakfast   Continuous Infusions: . sodium chloride 125 mL/hr at 03/08/13 1050  . lactated ringers 20 mL/hr at 03/07/13 1610    Principal Problem:   Fracture of left tibial plateau Active Problems:   DIABETES MELLITUS, TYPE II, UNCONTROLLED   ANXIETY   DEPRESSION   RESTLESS LEG SYNDROME   ALLERGIC RHINITIS   ASTHMA   GERD   ARTHRITIS   SEIZURE DISORDER   SLEEP APNEA   Headache(784.0)   Barrett's esophagus   Left tibial fracture   Hematoma   Cellulitis    Time spent: 30 minutes.     [facility]  [facility] Pager [phone]. If 7PM-7AM, please contact night-coverage at [email or web address], password [facility] 03/08/2013, 6:42 PM  LOS: 6 days

## 2013-03-08 NOTE — Progress Notes (Signed)
Physical Therapy Treatment Patient Details Name: Adrienne [name] MRN: [id number] DOB: Jul 08, 1955 Today's Date: 03/08/2013 Time: 9563-8756 PT Time Calculation (min): 34 min  PT Assessment / Plan / Recommendation  History of Present Illness Admitted with left tibial plateau fx surgically repaired on 03/07/13. Pt is NWB on LLE.    PT Comments   Pt now s/p surgical repair, and is experiencing increased pain and difficulty with mobility. Overall good rehab effort, and pt continues to plan for d/c home with HHPT to follow. Discussed briefly with pt that if pain control continues to be an issue and limits functional mobility, STR may be a safer option at d/c. CSW aware.   Follow Up Recommendations  Home health PT;Supervision for mobility/OOB     Does the patient have the potential to tolerate intense rehabilitation     Barriers to Discharge        Equipment Recommendations  Wheelchair (measurements PT);Wheelchair cushion (measurements PT)    Recommendations for Other Services    Frequency Min 5X/week   Progress towards PT Goals Progress towards PT goals: Progressing toward goals  Plan Current plan remains appropriate    Precautions / Restrictions Precautions Precautions: Fall Required Braces or Orthoses: Knee Immobilizer - Left Knee Immobilizer - Left: On at all times Restrictions Weight Bearing Restrictions: Yes LLE Weight Bearing: Non weight bearing   Pertinent Vitals/Pain 7/10 at rest after transfer to chair    Mobility  Bed Mobility Bed Mobility: Supine to Sit;Sitting - Scoot to Edge of Bed Supine to Sit: 4: Min assist;HOB elevated;With rails Sitting - Scoot to Edge of Bed: 4: Min assist Details for Bed Mobility Assistance: Assist for movement and support of LLE. VC's for sequencing and technique.  Transfers Transfers: Sit to Stand;Stand to [employer] Transfers Sit to Stand: 3: Mod assist;From bed;With upper extremity assist Stand to Sit: 4: Min assist;With upper  extremity assist;To chair/3-in-1 Stand Pivot Transfers: 4: Min assist (With RW) Details for Transfer Assistance: VC's for hand placement on seated surface for safety.  Ambulation/Gait Ambulation/Gait Assistance: Not tested (comment) (SPT to chair)    Exercises General Exercises - Lower Extremity Ankle Circles/Pumps: 10 reps Quad Sets: 10 reps   PT Diagnosis:    PT Problem List:   PT Treatment Interventions:     PT Goals (current goals can now be found in the care plan section) Acute Rehab PT Goals Patient Stated Goal: pt would like to go to her mother's house at DC PT Goal Formulation: With patient Time For Goal Achievement: 03/10/13 Potential to Achieve Goals: Good  Visit Information  Last PT Received On: 03/08/13 Assistance Needed: +1 History of Present Illness: Admitted with left tibial plateau fx surgically repaired on 03/07/13. Pt is NWB on LLE.     Subjective Data  Subjective: "I am having more trouble with pain than I thought I would." Patient Stated Goal: pt would like to go to her mother's house at DC   Cognition  Cognition Arousal/Alertness: Awake/alert Behavior During Therapy: WFL for tasks assessed/performed Overall Cognitive Status: Within Functional Limits for tasks assessed    Balance  Balance Balance Assessed: Yes Static Sitting Balance Static Sitting - Balance Support: Feet supported;Bilateral upper extremity supported Static Sitting - Level of Assistance: 5: Stand by assistance Dynamic Sitting Balance Dynamic Sitting - Balance Support: During functional activity;Feet unsupported;Left upper extremity supported Dynamic Sitting - Level of Assistance: 5: Stand by assistance Static Standing Balance Static Standing - Balance Support: Bilateral upper extremity supported Static Standing - Level  of Assistance: 4: Min assist  End of Session PT - End of Session Equipment Utilized During Treatment: Gait belt;Left knee immobilizer Activity Tolerance: Patient  limited by pain Patient left: in chair;with call bell/phone within reach Nurse Communication: Mobility status   GP     Ruthann Cancer 03/08/2013, 2:46 PM  Ruthann Cancer, PT, DPT [phone]

## 2013-03-09 LAB — CBC
HEMATOCRIT: 36.9 % (ref 36.0–46.0)
Hemoglobin: 11.9 g/dL — ABNORMAL LOW (ref 12.0–15.0)
MCH: 31.2 pg (ref 26.0–34.0)
MCHC: 32.2 g/dL (ref 30.0–36.0)
MCV: 96.9 fL (ref 78.0–100.0)
Platelets: 252 10*3/uL (ref 150–400)
RBC: 3.81 MIL/uL — AB (ref 3.87–5.11)
RDW: 14 % (ref 11.5–15.5)
WBC: 10.4 10*3/uL (ref 4.0–10.5)

## 2013-03-09 LAB — BASIC METABOLIC PANEL
BUN: 10 mg/dL (ref 6–23)
CHLORIDE: 104 meq/L (ref 96–112)
CO2: 27 meq/L (ref 19–32)
Calcium: 8.2 mg/dL — ABNORMAL LOW (ref 8.4–10.5)
Creatinine, Ser: 0.63 mg/dL (ref 0.50–1.10)
GFR calc Af Amer: >90 mL/min (ref >90)
GFR calc non Af Amer: >90 mL/min (ref >90)
GLUCOSE: 109 mg/dL — AB (ref 70–99)
POTASSIUM: 4.1 meq/L (ref 3.7–5.3)
Sodium: 141 mEq/L (ref 137–147)

## 2013-03-09 LAB — GLUCOSE, CAPILLARY
GLUCOSE-CAPILLARY: 141 mg/dL — AB (ref 70–99)
GLUCOSE-CAPILLARY: 228 mg/dL — AB (ref 70–99)
Glucose-Capillary: 138 mg/dL — ABNORMAL HIGH (ref 70–99)
Glucose-Capillary: 97 mg/dL (ref 70–99)
Glucose-Capillary: 114 mg/dL — ABNORMAL HIGH (ref 70–99)

## 2013-03-09 NOTE — Progress Notes (Signed)
ANTIBIOTIC CONSULT NOTE - Follow up  Pharmacy Consult for Vancomycin Indication: cellulitis  Allergies  Allergen Reactions  . Codeine     REACTION: headaches  . Morphine And Related Itching  . Penicillins Rash  . Sulfonamide Derivatives Rash    Patient Measurements: Height: 5' 5.35" (166 cm) Weight: 169 lb 12.1 oz (77 kg) IBW/kg (Calculated) : 57.81  Vital Signs: Temp: 97.6 F (36.4 C) (01/01 0552) Temp src: Oral (01/01 0552) BP: 93/47 mmHg (01/01 0552) Pulse Rate: 66 (01/01 0552) Intake/Output from previous day: 12/31 0701 - 01/01 0700 In: 7143.3 [P.O.:360; I.V.:5883.3; IV Piggyback:900] Out: 2750 [Urine:2750] Intake/Output from this shift: Total I/O In: 120 [P.O.:120] Out: -   Labs:  Recent Labs  03/06/13 1450 03/07/13 1600 03/09/13 1155  WBC 10.3 12.6* 10.4  HGB 13.2 12.8 11.9*  PLT 234 232 252  CREATININE  --  0.66 0.63    Microbiology: Recent Results (from the past 720 hour(s))  SURGICAL PCR SCREEN     Status: None   Collection Time    03/03/13  1:23 AM      Result Value Range Status   MRSA, PCR NEGATIVE  NEGATIVE Final   Staphylococcus aureus NEGATIVE  NEGATIVE Final   Comment:            The Xpert SA Assay (FDA     approved for NASAL specimens     in patients over 70 years of age),     is one component of     a comprehensive surveillance     program.  Test performance has     been validated by [employer] for patients greater     than or equal to 65 year old.     It is not intended     to diagnose infection nor to     guide or monitor treatment.    Medical History: Past Medical History  Diagnosis Date  . Allergic rhinitis   . Anemia     leukocytosis/thrombocytosis--Dr. Tressie Stalker  . Asthma   . Depression     ? Bipolar Disorder--Dr, Jimmye Norman  . GERD (gastroesophageal reflux disease)     Barrett's esophagus  . Low back pain   . Seizure disorder     ? absence  . Macular degeneration, bilateral     Dry-Dr. Iona Hansen  . Arthritis    . Hx of migraines   . Brain injury     Closed head  . OSA (obstructive sleep apnea)   . RLS (restless legs syndrome)   . NIDDM (non-insulin dependent diabetes mellitus)   . Status post right hip replacement   . Status post rotator cuff surgery     Assessment: 58 year old admitted 12/25 with new diagnosis of DM and a left tibia fracture after a fall.  Presented to surgery on 12/30.  Pharmacy asked to begin vancomycin for cellulitis of Lft arm.  Pt is afebrile with norm WBC, stable Scr, and improving.    Goal of Therapy:  Vancomycin trough level 10-15 mcg/ml  Plan:  1) Continue Vancomycin 750 mg iv Q 12 hours 2) Follow up plan and length of therapy, Scr, fever trend.  Jeronimo Norma, PharmD Clinical Pharmacist Resident Pager: [phone]  Jeronimo Norma 03/09/2013,1:05 PM

## 2013-03-09 NOTE — Progress Notes (Signed)
Subjective: 2 Days Post-Op Procedure(s) (LRB): OPEN REDUCTION INTERNAL FIXATION (ORIF) TIBIAL PLATEAU (Left) SHOULDER INJECTION ; Depomedrol + 0.25% Marcaine mixture 4:1 (Left) Patient reports pain as 7 on 0-10 scale.  Not much of an appetite but is tolerating diet.  No nausea or vomiting.  Positive flatus but no BM.  No lightheadedness, dizziness, shortness of breath, chest pain/pressure/palpitations.    Objective: Vital signs in last 24 hours: Temp:  [97.6 F (36.4 C)-98.4 F (36.9 C)] 97.6 F (36.4 C) (01/01 0552) Pulse Rate:  [65-69] 66 (01/01 0552) Resp:  [16-19] 19 (01/01 0552) BP: (90-93)/(47-52) 93/47 mmHg (01/01 0552) SpO2:  [95 %-100 %] 100 % (01/01 0552)  Intake/Output from previous day: 12/31 0701 - 01/01 0700 In: 7143.3 [P.O.:360; I.V.:5883.3; IV Piggyback:900] Out: 2750 [Urine:2750] Intake/Output this shift: Total I/O In: 120 [P.O.:120] Out: -    Recent Labs  03/06/13 1450 03/07/13 1600  HGB 13.2 12.8    Recent Labs  03/06/13 1450 03/07/13 1600  WBC 10.3 12.6*  RBC 4.22 4.08  HCT 40.4 39.2  PLT 234 232    Recent Labs  03/07/13 1600  CREATININE 0.66   No results found for this basename: LABPT, INR,  in the last 72 hours  Neurologically intact ABD soft Neurovascular intact Sensation intact distally Intact pulses distally Dorsiflexion/Plantar flexion intact Incision: scant drainage Compartment soft Dressing changed by me today  Assessment/Plan: 2 Days Post-Op Procedure(s) (LRB): OPEN REDUCTION INTERNAL FIXATION (ORIF) TIBIAL PLATEAU (Left) SHOULDER INJECTION ; Depomedrol + 0.25% Marcaine mixture 4:1 (Left) Advance diet Up with therapy Non-weight bearing LLE Dry dressing change PRN Continue plan per medicine  ANTON, M. LINDSEY 03/09/2013, 11:18 AM

## 2013-03-09 NOTE — Progress Notes (Signed)
Occupational Therapy Treatment Patient Details Name: Adrienne [name] MRN: [id number] DOB: 12-09-1955 Today's Date: 03/09/2013 Time: 0240-9735 OT Time Calculation (min): 23 min  OT Assessment / Plan / Recommendation  History of present illness Admitted with left tibial plateau fx surgically repaired on 03/07/13. Pt is NWB on LLE.    OT comments  Pt making progress with functional goals and with decreased reports of pain this session (6/10). Pt to continue with acute OT services tro increase level of function and safety  Follow Up Recommendations  Home health OT;Supervision/Assistance - 24 hour    Barriers to Discharge       Equipment Recommendations  None recommended by OT    Recommendations for Other Services    Frequency Min 2X/week   Progress towards OT Goals Progress towards OT goals: Progressing toward goals  Plan Discharge plan remains appropriate    Precautions / Restrictions Precautions Precautions: Fall Required Braces or Orthoses: Knee Immobilizer - Left Knee Immobilizer - Left: On at all times Restrictions Weight Bearing Restrictions: Yes LLE Weight Bearing: Non weight bearing   Pertinent Vitals/Pain 6/10 L LE pain    ADL  Grooming: Performed;Wash/dry hands;Wash/dry face;Set up;Min guard;Minimal assistance Upper Body Bathing: Simulated;Minimal assistance Lower Body Bathing: Simulated;Moderate assistance Upper Body Dressing: Performed;Minimal assistance Toilet Transfer: Performed;Minimal assistance Toilet Transfer Method: Sit to stand;Stand pivot Toilet Transfer Equipment: Bedside commode Toileting - Clothing Manipulation and Hygiene: Performed;Maximal assistance Tub/Shower Transfer Method: Not assessed Transfers/Ambulation Related to ADLs: cues for hand placement & LLE positioning.  (A) to power up to standing, balance, & controlled descent.      OT Diagnosis:    OT Problem List:   OT Treatment Interventions:     OT Goals(current goals can now be found in  the care plan section) Acute Rehab OT Goals Patient Stated Goal: pt would like to go to her mother's house at [place] OT Received On: 03/09/13 Assistance Needed: +1 History of Present Illness: Admitted with left tibial plateau fx surgically repaired on 03/07/13. Pt is NWB on LLE.     Subjective Data      Prior Functioning       Cognition  Cognition Arousal/Alertness: Awake/alert Behavior During Therapy: WFL for tasks assessed/performed Overall Cognitive Status: Within Functional Limits for tasks assessed    Mobility  Bed Mobility Bed Mobility: Supine to Sit;Sitting - Scoot to [employer] of Bed;Sit to Supine;Scooting to HOB Supine to Sit: 4: Min assist;HOB flat Sitting - Scoot to Edge of Bed: 4: Min assist Sit to Supine: 4: Min assist Scooting to [facility]: 4: Min guard Details for Bed Mobility Assistance: (A) for LLE  Transfers Transfers: Sit to Stand;Stand to Sit Sit to Stand: 4: Min assist;With upper extremity assist;From bed;From chair/3-in-1 Stand to Sit: 4: Min assist;With upper extremity assist;With armrests;To chair/3-in-1;To bed Details for Transfer Assistance: cues for hand placement & LLE positioning.  (A) to power up to standing, balance, & controlled descent.      Exercises      Balance Dynamic Sitting Balance Dynamic Sitting - Balance Support: During functional activity;Feet unsupported;Left upper extremity supported Dynamic Sitting - Level of Assistance: 5: Stand by assistance Static Standing Balance Static Standing - Balance Support: Bilateral upper extremity supported Static Standing - Level of Assistance: 4: Min assist   End of Session OT - End of Session Equipment Utilized During Treatment: Gait belt;Other (comment) (BSC) Activity Tolerance: Patient limited by pain Patient left: with call bell/phone within reach;in bed  GO  Emmit [facility] 03/09/2013, 1:29 PM

## 2013-03-09 NOTE — Progress Notes (Signed)
Physical Therapy Treatment Patient Details Name: Adrienne [name] MRN: [id number] DOB: [birth year] Today's Date: 03/09/2013 Time: 1025-1036 PT Time Calculation (min): 11 min  PT Assessment / Plan / Recommendation  History of Present Illness Admitted with left tibial plateau fx surgically repaired on 03/07/13. Pt is NWB on LLE.    PT Comments   Pt agreeable to participate in therapy.  Reports pain as moderate & increases with activity.  Pt unable to take "hops" at this time but shimmy's Lt foot for stand-pivot transfer.     Follow Up Recommendations  Home health PT;Supervision for mobility/OOB     Does the patient have the potential to tolerate intense rehabilitation     Barriers to Discharge        Equipment Recommendations  Wheelchair (measurements PT);Wheelchair cushion (measurements PT)    Recommendations for Other Services    Frequency Min 5X/week   Progress towards PT Goals Progress towards PT goals: Progressing toward goals (slowly)  Plan Current plan remains appropriate    Precautions / Restrictions Precautions Precautions: Fall Required Braces or Orthoses: Knee Immobilizer - Left Restrictions LLE Weight Bearing: Non weight bearing   Pertinent Vitals/Pain 8/10 Lt LE.  Repositioned for comfort.  RN made notified for pain medication.      Mobility  Bed Mobility Bed Mobility: Supine to Sit;Sitting - Scoot to Edge of Bed Supine to Sit: 4: Min assist;HOB flat Sitting - Scoot to [employer] of Bed: 4: Min assist Details for Bed Mobility Assistance: (A) for LLE  Transfers Transfers: Sit to Stand;Stand to Sit Sit to Stand: 4: Min assist;With upper extremity assist;From bed Stand to Sit: 4: Min assist;With upper extremity assist;With armrests;To chair/3-in-1 Details for Transfer Assistance: cues for hand placement & LLE positioning.  (A) to power up to standing, balance, & controlled descent.   Ambulation/Gait Ambulation/Gait Assistance: 4: Min assist Assistive device: Rolling  walker Ambulation/Gait Assistance Details: Pt unable to clear floor with Rt foot but does "heel/toe" shimmy.   Stairs: No Wheelchair Mobility Wheelchair Mobility: No      PT Goals (current goals can now be found in the care plan section) Acute Rehab PT Goals PT Goal Formulation: With patient Time For Goal Achievement: 03/10/13 Potential to Achieve Goals: Good  Visit Information  Last PT Received On: 03/09/13 Assistance Needed: +1 History of Present Illness: Admitted with left tibial plateau fx surgically repaired on 03/07/13. Pt is NWB on LLE.     Subjective Data      Cognition  Cognition Arousal/Alertness: Awake/alert Behavior During Therapy: WFL for tasks assessed/performed Overall Cognitive Status: Within Functional Limits for tasks assessed    Balance     End of Session PT - End of Session Equipment Utilized During Treatment: Gait belt;Left knee immobilizer Activity Tolerance: Patient tolerated treatment well;Patient limited by pain Patient left: in chair;with call bell/phone within reach Nurse Communication: Mobility status   GP     Sena Hitch 03/09/2013, 12:44 PM   Sarajane Marek, PTA [phone] 03/09/2013

## 2013-03-09 NOTE — Progress Notes (Signed)
[facility] PROGRESS NOTE  Adrienne [name] IWP:[id number] DOB: 12-05-1955 DOA: 03/02/2013 PCP: No PCP Per Patient  Assessment/Plan: 58 year old admitted 12-25 with new diagnosis of diabetes and left tibia fracture after mechanical fall.   1-Left tibial fracture:  S/p ORIF tibial plateau -Continue with  Lovenox for DVT prophylaxis, hold the day of surgery.  -Pain control is better after  d/c PCA and started Fentanyl patch 25 mcg q 72 hrs and Dilaudid 1 mg q 2 hr prn. -Docusate, Miralax  for Bowel regimen.   2-Diabetes mellitus type 2, uncontrolled, new onset: HB A-1c 7.5. Continue with SSI.   3-Anxiety: We'll continue her Xanax PRN.  4.Asthma:  albuterol nebs when necessary.   5.Depression: Continue home meds   6. 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 The patient says she has not had any seizures for over 5 years.   7.Cellulitisof left arm- Improving, continue with vancomycin. Doppler ultrasound is negative for DVT.  Code Status: Full Code.  Family Communication: Care discussed with patient.  Disposition Plan: remain inpatient.    Consultants:  Dr Percell Miller.   Procedures:  none  Antibiotics:  none  HPI/Subjective: Patient seen and examined, s/p surgery, pain is better.  Objective: Filed Vitals:   03/09/13 0552  BP: 93/47  Pulse: 66  Temp: 97.6 F (36.4 C)  Resp: 19    Intake/Output Summary (Last 24 hours) at 03/09/13 1056 Last data filed at 03/09/13 1000  Gross per 24 hour  Intake 7143.33 ml  Output   2750 ml  Net 4393.33 ml   Filed Weights   03/06/13 1317  Weight: 77 kg (169 lb 12.1 oz)    Exam:  Physical Exam: Head: Normocephalic, atraumatic.  Eyes: No signs of jaundice, EOMI Nose: Mucous membranes dry.  Throat: Oropharynx nonerythematous, no exudate appreciated.  Neck: supple,No deformities, masses, or tenderness noted. Lungs: Normal respiratory  effort. B/L Clear to auscultation, no crackles or wheezes.  Heart: Regular RR. S1 and S2 normal  Abdomen: BS normoactive. Soft, Nondistended, non-tender.  Extremities:Improved erythema of left arm   Data Reviewed: Basic Metabolic Panel:  Recent Labs Lab 03/02/13 1550 03/02/13 2258 03/03/13 0530 03/04/13 0651 03/05/13 0830 03/07/13 1600  NA 135  --  141 137 136  --   K 4.0  --  3.7 3.8 3.9  --   CL 97  --  103 102 100  --   CO2 21  --  28 25 28   --   GLUCOSE 216*  --  191* 149* 195*  --   BUN 13  --  12 8 9   --   CREATININE 0.64  --  0.64 0.60 0.72 0.66  CALCIUM 9.0  --  8.4 8.0* 8.2*  --   MG  --  2.0  --   --   --   --   PHOS  --  4.1  --   --   --   --    Liver Function Tests:  Recent Labs Lab 03/03/13 0530  AST 29  ALT 21  ALKPHOS 56  BILITOT 0.5  PROT 6.0  ALBUMIN 2.9*   No results found for this basename: LIPASE, AMYLASE,  in the last 168 hours No results found for this basename: AMMONIA,  in the last 168 hours CBC:  Recent Labs Lab 03/02/13 1550 03/03/13 0530 03/04/13 0651 03/05/13 0830 03/06/13 1450 03/07/13 1600  WBC 14.2*  10.6* 11.5* 11.5* 10.3 12.6*  NEUTROABS 9.3*  --   --   --   --   --   HGB 15.1* 13.7 13.2 12.6 13.2 12.8  HCT 45.1 41.3 40.6 38.7 40.4 39.2  MCV 92.6 93.7 95.5 95.6 95.7 96.1  PLT 249 230 206 216 234 232   Cardiac Enzymes: No results found for this basename: CKTOTAL, CKMB, CKMBINDEX, TROPONINI,  in the last 168 hours BNP (last 3 results) No results found for this basename: PROBNP,  in the last 8760 hours CBG:  Recent Labs Lab 03/07/13 2156 03/08/13 0701 03/08/13 1232 03/08/13 2142 03/09/13 0631  GLUCAP 133* 128* 142* 98 97    Recent Results (from the past 240 hour(s))  SURGICAL PCR SCREEN     Status: None   Collection Time    03/03/13  1:23 AM      Result Value Range Status   MRSA, PCR NEGATIVE  NEGATIVE Final   Staphylococcus aureus NEGATIVE  NEGATIVE Final   Comment:            The Xpert SA Assay (FDA      approved for NASAL specimens     in patients over 12 years of age),     is one component of     a comprehensive surveillance     program.  Test performance has     been validated by [employer] for patients greater     than or equal to 85 year old.     It is not intended     to diagnose infection nor to     guide or monitor treatment.     Studies: Dg Tibia/fibula Left  03/07/2013   CLINICAL DATA:  Tibial plateau fracture.   ORIF.  EXAM: LEFT TIBIA AND FIBULA - 2 VIEW; DG C-ARM 1-60 MIN  COMPARISON:  CT 03/02/2013.  FINDINGS: Patient has undergone fixation of the lateral tibial plateau fracture using a lateral plate and screws. There is restoration of the articular surface of the lateral tibial plateau. No residual depression of the articular surface or complications are identified.  IMPRESSION: Tibial ORIF for plateau fracture without demonstrated complication.   Electronically Signed   By: Camie Patience M.D.   On: 03/07/2013 14:19   Dg Knee Left Port  03/07/2013   CLINICAL DATA:  Postop left knee internal fixation  EXAM: PORTABLE LEFT KNEE - 1-2 VIEW  COMPARISON:  None.  FINDINGS: There is a lateral proximal tibial side plate with multiple interlocking screws transfixing a lateral tibial plateau fracture which is in anatomic alignment. There is no hardware failure or complication. There is no other fracture or dislocation. There are postsurgical changes in the surrounding soft tissues.  IMPRESSION: Proximal left lateral tibial orthopedic hardware in satisfactory position without a failure or complication.   Electronically Signed   By: Kathreen Devoid   On: 03/07/2013 13:52   Dg C-arm 1-60 Min  03/07/2013   CLINICAL DATA:  Tibial plateau fracture.   ORIF.  EXAM: LEFT TIBIA AND FIBULA - 2 VIEW; DG C-ARM 1-60 MIN  COMPARISON:  CT 03/02/2013.  FINDINGS: Patient has undergone fixation of the lateral tibial plateau fracture using a lateral plate and screws. There is restoration of the articular  surface of the lateral tibial plateau. No residual depression of the articular surface or complications are identified.  IMPRESSION: Tibial ORIF for plateau fracture without demonstrated complication.   Electronically Signed   By: Modesta Messing.D.  On: 03/07/2013 14:19    Scheduled Meds: . docusate sodium  100 mg Oral BID  . enoxaparin (LOVENOX) injection  40 mg Subcutaneous Q24H  . fentaNYL  25 mcg Transdermal Q72H  . furosemide  20 mg Oral Daily  . insulin aspart  0-15 Units Subcutaneous TID WC  . methylphenidate  10 mg Oral BID  . pantoprazole  40 mg Oral Daily  . polyethylene glycol  17 g Oral BID  . sodium chloride  3 mL Intravenous Q12H  . vancomycin  750 mg Intravenous Q12H  . venlafaxine XR  150 mg Oral Q breakfast   Continuous Infusions: . sodium chloride 125 mL/hr at 03/09/13 0400  . lactated ringers 20 mL/hr at 03/07/13 [id number]    Principal Problem:   Fracture of left tibial plateau Active Problems:   DIABETES MELLITUS, TYPE II, UNCONTROLLED   ANXIETY   DEPRESSION   RESTLESS LEG SYNDROME   ALLERGIC RHINITIS   ASTHMA   GERD   ARTHRITIS   SEIZURE DISORDER   SLEEP APNEA   Headache(784.0)   Barrett's esophagus   Left tibial fracture   Hematoma   Cellulitis    Time spent: 30 minutes.     [facility] Pager [phone]. If 7PM-7AM, please contact night-coverage at [email or web address], password [facility] 03/09/2013, 10:56 AM  LOS: 7 days

## 2013-03-10 ENCOUNTER — Inpatient Hospital Stay
Admission: RE | Admit: 2013-03-10 | Discharge: 2013-03-20 | Disposition: A | Discharge status: Home / Self Care | Payer: BC Managed Care – PPO | Source: Ambulatory Visit | Attending: Internal Medicine | Admitting: Internal Medicine

## 2013-03-10 LAB — GLUCOSE, CAPILLARY
GLUCOSE-CAPILLARY: 126 mg/dL — AB (ref 70–99)
Glucose-Capillary: 120 mg/dL — ABNORMAL HIGH (ref 70–99)
Glucose-Capillary: 119 mg/dL — ABNORMAL HIGH (ref 70–99)

## 2013-03-10 MED ORDER — ALPRAZOLAM 1 MG PO TABS: 1.0000 mg | ORAL_TABLET | Freq: Every evening | ORAL | Status: DC | PRN

## 2013-03-10 MED ORDER — OXYCODONE-ACETAMINOPHEN 5-325 MG PO TABS
1.0000 | ORAL_TABLET | ORAL | Status: DC | PRN
  Administered 2013-03-10: 1 via ORAL
  Filled 2013-03-10: qty 1

## 2013-03-10 MED ORDER — DOXYCYCLINE HYCLATE 100 MG PO TABS: 100.0000 mg | ORAL_TABLET | Freq: Two times a day (BID) | ORAL | Status: DC

## 2013-03-10 MED ORDER — INSULIN ASPART 100 UNIT/ML ~~LOC~~ SOLN: 0.0000 [IU] | Freq: Three times a day (TID) | SUBCUTANEOUS | Status: DC

## 2013-03-10 MED ORDER — DOXYCYCLINE HYCLATE 100 MG PO TABS
100.0000 mg | ORAL_TABLET | Freq: Two times a day (BID) | ORAL | Status: DC
  Administered 2013-03-10: 100 mg via ORAL
  Filled 2013-03-10 (×2): qty 1

## 2013-03-10 MED ORDER — ASPIRIN EC 325 MG PO TBEC: 325.0000 mg | DELAYED_RELEASE_TABLET | Freq: Every day | ORAL | Status: DC

## 2013-03-10 MED ORDER — POLYETHYLENE GLYCOL 3350 17 G PO PACK: 17.0000 g | PACK | Freq: Every day | ORAL | Status: DC | PRN

## 2013-03-10 MED ORDER — OXYCODONE-ACETAMINOPHEN 5-325 MG PO TABS: 2.0000 | ORAL_TABLET | ORAL | Status: DC | PRN

## 2013-03-10 MED ORDER — SENNOSIDES-DOCUSATE SODIUM 8.6-50 MG PO TABS
1.0000 | ORAL_TABLET | Freq: Two times a day (BID) | ORAL | Status: DC
  Filled 2013-03-10: qty 1

## 2013-03-10 MED ORDER — MAGIC MOUTHWASH W/LIDOCAINE
10.0000 mL | Freq: Three times a day (TID) | ORAL | Status: DC | PRN
  Administered 2013-03-10: 10 mL via ORAL
  Filled 2013-03-10: qty 10

## 2013-03-10 MED ORDER — SENNOSIDES-DOCUSATE SODIUM 8.6-50 MG PO TABS: 1.0000 | ORAL_TABLET | Freq: Two times a day (BID) | ORAL | Status: DC

## 2013-03-10 NOTE — Discharge Summary (Signed)
Physician Discharge Summary  Adrienne [name] ERX:[id number] DOB: Jan 08, 1956 DOA: 03/02/2013  PCP: No PCP Per Patient  Admit date: 03/02/2013 Discharge date: 03/10/2013  Time spent: 55* minutes  Recommendations for Outpatient Follow-up:  1. Follow up PCP in 2 weeks 2. Follow up Dr Percell Miller in 2 weeks  Discharge Diagnoses:  Principal Problem:   Fracture of left tibial plateau Active Problems:   DIABETES MELLITUS, TYPE II, UNCONTROLLED   ANXIETY   DEPRESSION   RESTLESS LEG SYNDROME   ALLERGIC RHINITIS   ASTHMA   GERD   ARTHRITIS   SEIZURE DISORDER   SLEEP APNEA   Headache(784.0)   Barrett's esophagus   Left tibial fracture   Hematoma   Cellulitis   Discharge Condition: Stable  Diet recommendation: Low salt diet  Filed Weights   03/06/13 1317  Weight: 77 kg (169 lb 12.1 oz)    History of present illness:  58 y.o. female  Last night at about 11pm pt was walking down stairs in her home and she missed a step. She fell down 3 steps and dropped the 3 parcels she was carrying. She did not hear or feel anysnaps or pops but felt immediate 10/10 pain in her left knee. She sat on the floor for 20 minutes, hoping it would feel better but it did not. She pulled herself up by the railing and was unable to bear weight. She slid to a phone about 2 feet away and called her mother and sister. They arrived in 10 minutes and called EMS. She was transported to [facility] but declined admission due to wanting to spend the holidays with family. She was home for about 10 hours before returning to the ED for admission due to inability to manage mobility and pain at home.      Hospital Course:  58 year old admitted 12-25 with new diagnosis of diabetes and left tibia fracture after mechanical fall. 1-Left tibial fracture:  S/p ORIF tibial plateau Patient underwent ORIF, will be discharged to SNF for rehab.will follow up Dr Percell Miller in 2 weeks.  -Started Senna S tabs 1 po BID for constipation.  - Aspirin   for DVT prophylaxis as per ortho - percocet 2 tabs po q 4 hr prn  2-Diabetes mellitus type 2, uncontrolled, new onset:  HB A-1c 7.5. Continue with SSI.   3-Anxiety: We'll continue her Xanax PRN.   4.Asthma: albuterol nebs when necessary.   5.Depression: Continue home meds   6. 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 The patient says she has not had any seizures for over 5 years.   7.Cellulitisof left arm- Improving, will change the vancomycin to Doxycycline for seven more days. Doppler ultrasound is negative for DVT.  Procedures:  ORIF for tibial plateau fracture  Upper extremity doppler negative for DVT  Consultations:  Orthopedics  Discharge Exam: Filed Vitals:   03/10/13 1300  BP: 104/54  Pulse: 65  Temp: 98.1 F (36.7 C)  Resp: 18     Discharge Instructions  Discharge Orders   Future Orders Complete By Expires   Diet - low sodium heart healthy  As directed    Non weight bearing  As directed    Questions:     Laterality:     Extremity:         Medication List    STOP taking these medications       aspirin 81 MG tablet  Replaced by:  aspirin EC 325 MG tablet      TAKE these medications       ALPRAZolam 1 MG tablet  Commonly known as:  XANAX  Take 1 tablet (1 mg total) by mouth at bedtime as needed.     aspirin EC 325 MG tablet  Take 1 tablet (325 mg total) by mouth daily.     BIOTIN PO  Take 1 capsule by mouth daily.     Cinnamon 500 MG Tabs  Take by mouth.     cyclobenzaprine 10 MG tablet  Commonly known as:  FLEXERIL  Take 10 mg by mouth at bedtime.     desvenlafaxine 100 MG 24 hr tablet  Commonly known as:  PRISTIQ  Take 100 mg by mouth daily.     doxycycline 100 MG tablet  Commonly known as:  VIBRA-TABS  Take 1 tablet (100 mg total) by mouth every 12 (twelve) hours.     fish oil-omega-3 fatty acids 1000 MG capsule  Take 2 g by mouth daily.      FLAX SEED OIL PO  Take by mouth.     furosemide 20 MG tablet  Commonly known as:  LASIX  Take 20 mg by mouth daily.     insulin aspart 100 UNIT/ML injection  Commonly known as:  novoLOG  Inject 0-15 Units into the skin 3 (three) times daily with meals.     Melatonin 10 MG Caps  Take 10 mg by mouth at bedtime.     methylphenidate 10 MG tablet  Commonly known as:  RITALIN  Take 10 mg by mouth 2 (two) times daily.     multivitamin capsule  Take 1 capsule by mouth daily.     omeprazole 20 MG capsule  Commonly known as:  PRILOSEC  Take 20 mg by mouth daily.     oxyCODONE-acetaminophen 5-325 MG per tablet  Commonly known as:  PERCOCET/ROXICET  Take 2 tablets by mouth every 4 (four) hours as needed for severe pain.     polyethylene glycol packet  Commonly known as:  MIRALAX / GLYCOLAX  Take 17 g by mouth daily as needed.     senna-docusate 8.6-50 MG per tablet  Commonly known as:  Senokot-S  Take 1 tablet by mouth 2 (two) times daily.     VITAMIN B 12 PO  Take by mouth.     Vitamin D3 400 UNITS Caps  Take by mouth.     vitamin E 1000 UNIT capsule  Take 1,000 Units by mouth daily.       Allergies  Allergen Reactions  . Codeine     REACTION: headaches  . Morphine And Related Itching  . Penicillins Rash  . Sulfonamide Derivatives Rash       Follow-up Information   Follow up with MURPHY, TIMOTHY, D, MD. Schedule an appointment as soon as possible for a visit in 2 weeks.   Specialty:  Orthopedic Surgery   Contact information:   [address] [place] [address] [phone]        The results of significant diagnostics from this hospitalization (including imaging, microbiology, ancillary and laboratory) are listed below for reference.    Significant Diagnostic Studies: Dg Hip Complete Right  03/02/2013   CLINICAL DATA:  Fall. Right hip pain. Hip arthroplasty in near 2010.  EXAM: RIGHT HIP - COMPLETE 2+ VIEW  COMPARISON:  12/16/2007  radiographs.  FINDINGS: Uncomplicated right total hip arthroplasty. There is mild lateral migration of  the distal femoral stem. No periprosthetic fracture is present. Acetabular cup appears within normal limits. The obturator rings are intact. Moderate to severe left hip osteoarthritis is incidentally noted. L4-L5 right-sided spondylosis.  IMPRESSION: Uncomplicated right total hip arthroplasty. No acute osseous abnormality.   Electronically Signed   By: Dereck Ligas M.D.   On: 03/02/2013 19:18   Dg Tibia/fibula Left  03/07/2013   CLINICAL DATA:  Tibial plateau fracture.   ORIF.  EXAM: LEFT TIBIA AND FIBULA - 2 VIEW; DG C-ARM 1-60 MIN  COMPARISON:  CT 03/02/2013.  FINDINGS: Patient has undergone fixation of the lateral tibial plateau fracture using a lateral plate and screws. There is restoration of the articular surface of the lateral tibial plateau. No residual depression of the articular surface or complications are identified.  IMPRESSION: Tibial ORIF for plateau fracture without demonstrated complication.   Electronically Signed   By: Camie Patience M.D.   On: 03/07/2013 14:19   Ct Knee Left Wo Contrast  03/02/2013   CLINICAL DATA:  Status post fall; left knee pain. Tibial fracture noted on radiograph.  EXAM: CT OF THE LEFT KNEE WITHOUT CONTRAST  TECHNIQUE: Multidetector CT imaging was performed according to the standard protocol. Multiplanar CT image reconstructions were also generated.  COMPARISON:  None.  FINDINGS: There is a near vertical fracture extending through the lateral tibial plateau, with mild associated impaction. There is focal depression at the posterior aspect of the lateral tibial plateau adjacent to the tibiofibular articulation, measuring 7 mm, compatible with a Schatzker type 2 fracture. There is slight comminution at the site of depression, and mild depression is noted more anteriorly. However, the somewhat complex fracture line extends across the midportion of the medial tibial  plateau and anterior aspect of the tibial spine, rather than through the anterior aspect of the lateral tibial plateau, with slight comminution at the tibial spine. As a result, the dominant fracture line would be considered incomplete.  The anterior cruciate ligament and posterior cruciate ligament appear grossly intact, though the anterior cruciate ligament appears to insert on a minimally displaced tibial spine fragment. The menisci are not well assessed, though no definite extrusion is seen. The medial collateral ligament and lateral collateral ligament complex are grossly unremarkable, though part of the lateral collateral ligament complex extends to the depressed tibial plateau fragment.  A large lipohemarthrosis is noted. No additional fractures are seen. The quadriceps and patellar tendons are unremarkable in appearance.  IMPRESSION: 1. Somewhat unusual Schatzker type 2 fracture involving both the medial and lateral tibial plateau, and the tibial spine. Focal depression at the posterior aspect of the lateral tibial plateau adjacent to the tibiofibular articulation, measuring 7 mm, with slight comminution at the site of depression, and mild depression more anteriorly. The somewhat complex fracture line extends across the midportion of the medial tibial plateau and anterior aspect of the tibial spine, rather than through the anterior aspect of the lateral tibial plateau, and demonstrates slight comminution at the tibial spine. The dominant fracture line appears incomplete. 2. Anterior cruciate ligament appears to insert on a minimally displaced tibial spine fragment. Menisci not well assessed. 3. Large lipohemarthrosis noted.   Electronically Signed   By: Garald Balding M.D.   On: 03/02/2013 04:10   Dg Knee Complete 4 Views Left  03/02/2013   CLINICAL DATA:  Status post fall down steps, with left knee pain.  EXAM: LEFT KNEE - COMPLETE 4+ VIEW  COMPARISON:  None.  FINDINGS: Vague lucencies are noted at both  tibial plateaus, with apparent disruption of the articular surface on both sides and mild flattening of the lateral tibial plateau. This is suspicious for a Schatzker type 5 tibial plateau fracture involving both condyles, though the distal extension of the fracture lines is not well characterized.  A associated large lipohemarthrosis is seen. The fracture may involve the tibial spine, given the slightly unusual appearance along the tibial spine on the lateral view. The distal femur appears intact. The patella is grossly unremarkable in appearance. No fibular fracture is identified.  IMPRESSION: Disruption of the articular surface at both the medial and lateral tibial plateaus, with mild flattening of the lateral tibial plateau. This is suspicious for a Schatzker type 5 tibial plateau fracture involving both condyles, though the distal extension of the fracture lines is not well characterized. Associated large lipohemarthrosis seen. Possible additional fracture line involving the tibial spine.   Electronically Signed   By: Garald Balding M.D.   On: 03/02/2013 02:22   Dg Knee Left Port  03/07/2013   CLINICAL DATA:  Postop left knee internal fixation  EXAM: PORTABLE LEFT KNEE - 1-2 VIEW  COMPARISON:  None.  FINDINGS: There is a lateral proximal tibial side plate with multiple interlocking screws transfixing a lateral tibial plateau fracture which is in anatomic alignment. There is no hardware failure or complication. There is no other fracture or dislocation. There are postsurgical changes in the surrounding soft tissues.  IMPRESSION: Proximal left lateral tibial orthopedic hardware in satisfactory position without a failure or complication.   Electronically Signed   By: Kathreen Devoid   On: 03/07/2013 13:52   Dg C-arm 1-60 Min  03/07/2013   CLINICAL DATA:  Tibial plateau fracture.   ORIF.  EXAM: LEFT TIBIA AND FIBULA - 2 VIEW; DG C-ARM 1-60 MIN  COMPARISON:  CT 03/02/2013.  FINDINGS: Patient has undergone  fixation of the lateral tibial plateau fracture using a lateral plate and screws. There is restoration of the articular surface of the lateral tibial plateau. No residual depression of the articular surface or complications are identified.  IMPRESSION: Tibial ORIF for plateau fracture without demonstrated complication.   Electronically Signed   By: Camie Patience M.D.   On: 03/07/2013 14:19    Microbiology: Recent Results (from the past 240 hour(s))  SURGICAL PCR SCREEN     Status: None   Collection Time    03/03/13  1:23 AM      Result Value Range Status   MRSA, PCR NEGATIVE  NEGATIVE Final   Staphylococcus aureus NEGATIVE  NEGATIVE Final   Comment:            The Xpert SA Assay (FDA     approved for NASAL specimens     in patients over 58 years of age),     is one component of     a comprehensive surveillance     program.  Test performance has     been validated by [employer] for patients greater     than or equal to 58 year old.     It is not intended     to diagnose infection nor to     guide or monitor treatment.     Labs: Basic Metabolic Panel:  Recent Labs Lab 03/04/13 0651 03/05/13 0830 03/07/13 1600 03/09/13 1155  NA 137 136  --  141  K 3.8 3.9  --  4.1  CL 102 100  --  104  CO2 25 28  --  27  GLUCOSE 149* 195*  --  109*  BUN 8 9  --  10  CREATININE 0.60 0.72 0.66 0.63  CALCIUM 8.0* 8.2*  --  8.2*   Liver Function Tests: No results found for this basename: AST, ALT, ALKPHOS, BILITOT, PROT, ALBUMIN,  in the last 168 hours No results found for this basename: LIPASE, AMYLASE,  in the last 168 hours No results found for this basename: AMMONIA,  in the last 168 hours CBC:  Recent Labs Lab 03/04/13 0651 03/05/13 0830 03/06/13 1450 03/07/13 1600 03/09/13 1155  WBC 11.5* 11.5* 10.3 12.6* 10.4  HGB 13.2 12.6 13.2 12.8 11.9*  HCT 40.6 38.7 40.4 39.2 36.9  MCV 95.5 95.6 95.7 96.1 96.9  PLT 206 216 234 232 252   Cardiac Enzymes: No results found for  this basename: CKTOTAL, CKMB, CKMBINDEX, TROPONINI,  in the last 168 hours BNP: BNP (last 3 results) No results found for this basename: PROBNP,  in the last 8760 hours CBG:  Recent Labs Lab 03/09/13 1116 03/09/13 1609 03/09/13 2225 03/10/13 0650 03/10/13 1151  GLUCAP 114* 141* 228* 126* 120*       Signed:  LAMA,GAGAN S  [facility] 03/10/2013, 3:18 PM

## 2013-03-10 NOTE — Progress Notes (Signed)
Clinical Social Work Department BRIEF PSYCHOSOCIAL ASSESSMENT 03/10/2013  Patient:  Adrienne [name], Adrienne [name]     Account Number:  [id number]     Admit date:  03/02/2013  Clinical Social Worker:  Adair Laundry  Date/Time:  03/10/2013 12:00 N  Referred by:  Physician  Date Referred:  03/10/2013 Referred for  SNF Placement   Other Referral:   Interview type:  Patient Other interview type:    PSYCHOSOCIAL DATA Living Status:  ALONE Admitted from facility:   Level of care:   Primary support name:  Bryson Dames [phone] Primary support relationship to patient:  PARENT Degree of support available:   Pt has supportive family    CURRENT CONCERNS Current Concerns  Post-Acute Placement   Other Concerns:    SOCIAL WORK ASSESSMENT / PLAN CSW informed by RN CM that pt will need SNF. CSW spoke with pt and pt family at bedside. Pt is agreeable to being faxed out to [facility] and has a preference to go to [facility]. CSW did call [place] to inform.   Assessment/plan status:  Psychosocial Support/Ongoing Assessment of Needs Other assessment/ plan:   Information/referral to community resources:   SNF list to be given with bed offers    PATIENT'S/FAMILY'S RESPONSE TO PLAN OF CARE: Pt is agreeable to SNF       [place], [place]

## 2013-03-10 NOTE — Progress Notes (Signed)
[facility] PROGRESS NOTE  Adrienne [name] NFA:[id number] DOB: 1955/07/15 DOA: 03/02/2013 PCP: No PCP Per Patient  Assessment/Plan: 58 year old admitted 12-25 with new diagnosis of diabetes and left tibia fracture after mechanical fall.   1-Left tibial fracture:  S/p ORIF tibial plateau -Pain control is better after  d/c PCA and started Fentanyl patch 25 mcg q 72 hrs and Dilaudid 1 mg q 2 hr prn. -Started Senna S tabs 1 po BID for constipation. - Lovenox for DVT prophylaxis  2-Diabetes mellitus type 2, uncontrolled, new onset:  HB A-1c 7.5. Continue with SSI.   3-Anxiety: We'll continue her Xanax PRN.  4.Asthma:  albuterol nebs when necessary.   5.Depression: Continue home meds   6. 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 The patient says she has not had any seizures for over 5 years.   7.Cellulitisof left arm- Improving, will change the vancomycin to Doxycycline for five more days. Doppler ultrasound is negative for DVT.  8. Mouth sores- ? Allergic reaction to vancomycin. Will d/c vancomycin , start magic mouthwash TID prn.  Code Status: Full Code.  Family Communication: Care discussed with patient.  Disposition Plan: remain inpatient.    Consultants:  Dr Percell Miller.   Procedures:  none  Antibiotics:  none  HPI/Subjective: Patient seen and examined, s/p surgery, pain is better.C/o soreness in mouth and constipation  Objective: Filed Vitals:   03/10/13 0640  BP: 140/70  Pulse: 82  Temp: 98.6 F (37 C)  Resp: 18    Intake/Output Summary (Last 24 hours) at 03/10/13 1014 Last data filed at 03/10/13 0500  Gross per 24 hour  Intake   1350 ml  Output      0 ml  Net   1350 ml   Filed Weights   03/06/13 1317  Weight: 77 kg (169 lb 12.1 oz)    Exam:  Physical Exam: Head: Normocephalic, atraumatic.  Eyes: No signs of jaundice, EOMI Nose: Mucous membranes dry.   Throat: Oropharynx nonerythematous, no exudate appreciated.  Mouth- Mild erythema of the oral mucosa Neck: supple,No deformities, masses, or tenderness noted. Lungs: Normal respiratory effort. B/L Clear to auscultation, no crackles or wheezes.  Heart: Regular RR. S1 and S2 normal  Abdomen: BS normoactive. Soft, Nondistended, non-tender.  Extremities:Improved erythema of left arm   Data Reviewed: Basic Metabolic Panel:  Recent Labs Lab 03/04/13 0651 03/05/13 0830 03/07/13 1600 03/09/13 1155  NA 137 136  --  141  K 3.8 3.9  --  4.1  CL 102 100  --  104  CO2 25 28  --  27  GLUCOSE 149* 195*  --  109*  BUN 8 9  --  10  CREATININE 0.60 0.72 0.66 0.63  CALCIUM 8.0* 8.2*  --  8.2*   Liver Function Tests: No results found for this basename: AST, ALT, ALKPHOS, BILITOT, PROT, ALBUMIN,  in the last 168 hours No results found for this basename: LIPASE, AMYLASE,  in the last 168 hours No results found for this basename: AMMONIA,  in the last 168 hours CBC:  Recent Labs Lab 03/04/13 0651 03/05/13 0830 03/06/13 1450 03/07/13 1600 03/09/13 1155  WBC 11.5* 11.5* 10.3 12.6* 10.4  HGB 13.2 12.6 13.2 12.8 11.9*  HCT 40.6 38.7 40.4 39.2 36.9  MCV 95.5 95.6 95.7 96.1 96.9  PLT 206 216 234 232 252   Cardiac Enzymes: No results found for this basename: CKTOTAL,  CKMB, CKMBINDEX, TROPONINI,  in the last 168 hours BNP (last 3 results) No results found for this basename: PROBNP,  in the last 8760 hours CBG:  Recent Labs Lab 03/09/13 0631 03/09/13 1116 03/09/13 1609 03/09/13 2225 03/10/13 0650  GLUCAP 97 114* 141* 228* 126*    Recent Results (from the past 240 hour(s))  SURGICAL PCR SCREEN     Status: None   Collection Time    03/03/13  1:23 AM      Result Value Range Status   MRSA, PCR NEGATIVE  NEGATIVE Final   Staphylococcus aureus NEGATIVE  NEGATIVE Final   Comment:            The Xpert SA Assay (FDA     approved for NASAL specimens     in patients over 21 years of  age),     is one component of     a comprehensive surveillance     program.  Test performance has     been validated by [employer] for patients greater     than or equal to 100 year old.     It is not intended     to diagnose infection nor to     guide or monitor treatment.     Studies: No results found.  Scheduled Meds: . doxycycline  100 mg Oral Q12H  . enoxaparin (LOVENOX) injection  40 mg Subcutaneous Q24H  . fentaNYL  25 mcg Transdermal Q72H  . furosemide  20 mg Oral Daily  . insulin aspart  0-15 Units Subcutaneous TID WC  . methylphenidate  10 mg Oral BID  . pantoprazole  40 mg Oral Daily  . polyethylene glycol  17 g Oral BID  . senna-docusate  1 tablet Oral BID  . sodium chloride  3 mL Intravenous Q12H  . venlafaxine XR  150 mg Oral Q breakfast   Continuous Infusions: . sodium chloride 20 mL/hr at 03/09/13 1729  . lactated ringers 20 mL/hr at 03/07/13 7829    Principal Problem:   Fracture of left tibial plateau Active Problems:   DIABETES MELLITUS, TYPE II, UNCONTROLLED   ANXIETY   DEPRESSION   RESTLESS LEG SYNDROME   ALLERGIC RHINITIS   ASTHMA   GERD   ARTHRITIS   SEIZURE DISORDER   SLEEP APNEA   Headache(784.0)   Barrett's esophagus   Left tibial fracture   Hematoma   Cellulitis    Time spent: 30 minutes.     [facility] Pager [phone]. If 7PM-7AM, please contact night-coverage at [email or web address], password [facility] 03/10/2013, 10:14 AM  LOS: 8 days

## 2013-03-10 NOTE — Progress Notes (Signed)
Spoke with Doran Stabler, PA. Dr. Percell Miller is okay with patient discharging today. Florian Buff, RN

## 2013-03-10 NOTE — Care Management Note (Signed)
CARE MANAGEMENT NOTE 03/10/2013  Patient:  Adrienne [name], Adrienne [name]   Account Number:  [id number]  Date Initiated:  03/03/2013  Documentation initiated by:  Ricki Miller  Subjective/Objective Assessment:   58 yr old female admitted with left tibial plateau fracture. Newly diagnosis Diabetic.     Action/Plan:   OR on Tuesday 03/07/13. CM will follow.  Patient's sister states she will need shortterm SNF d/t lack of care givers being available. Cm will notify SW to follow as well.  Choice for [facility] was offered. [facility] selected should patient go home.   Anticipated DC Date:  03/11/2013   Anticipated DC Plan:  SKILLED NURSING FACILITY  In-house referral  Clinical Social Worker      DC Planning Services  CM consult      Choice offered to / List presented to:  C-1 Patient           Status of service:  Completed, signed off Medicare Important Message given?   (If response is "NO", the following Medicare IM given date fields will be blank) Date Medicare IM given:   Date Additional Medicare IM given:    Discharge Disposition:  [place]

## 2013-03-10 NOTE — Progress Notes (Addendum)
Occupational Therapy Treatment Patient Details Name: Adrienne [name] MRN: [id number] DOB: [birth year] Today's Date: 03/10/2013 Time: 6269-4854 OT Time Calculation (min): 12 min  OT Assessment / Plan / Recommendation  History of present illness Admitted with left tibial plateau fx surgically repaired on 03/07/13. Pt is NWB on LLE. LUE shoulder soreness due to tendonitis from over use on RW per MD (s/p) shot this admission   OT comments  This 58 yo making progress and now D/C plan changed to SNF due to pt will not have consistent A at home.  Follow Up Recommendations  SNF             Frequency Min 2X/week   Progress towards OT Goals Progress towards OT goals: Progressing toward goals  Plan Discharge plan needs to be updated    Precautions / Restrictions Precautions Precautions: Fall Required Braces or Orthoses: Knee Immobilizer - Left Knee Immobilizer - Left: On at all times Restrictions LLE Weight Bearing: Non weight bearing   Pertinent Vitals/Pain No C/O pain    ADL  Upper Body Dressing: Set up Where Assessed - Upper Body Dressing: Unsupported sitting Lower Body Dressing: Minimal assistance Where Assessed - Lower Body Dressing: Unsupported sit to stand Toilet Transfer: Min Psychiatric nurse Method: Sit to Loss adjuster, chartered:  (Recliner 2 feet away from bed--pt hopping) ADL Comments: Instructed pt in the need to keep leg straight at all times untiil MD says she can start ROM on her LLE, so this means as far as bathing goes she will need to bath her LLE in bed, reclined in recliner, or with heel ono ground while seated on EOB--all so KI can be off. Edcuated on the need for LB clothing that she can easily get on over her left KI or cut LLE side short, regular shirts, and a shoe for RLE to have for rehab at SNF. Saran wrap [employer] Seal if LLE is to be kept dry when she is ok'd to shower.      OT Goals(current goals can now be found in the care plan section)     Visit Information  Last OT Received On: 03/10/13 Assistance Needed: +1 History of Present Illness: Admitted with left tibial plateau fx surgically repaired on 03/07/13. Pt is NWB on LLE. LUE shoulder soreness due to tendonitis from over use on RW per MD (s/p) shot this admission          Cognition  Cognition Arousal/Alertness: Awake/alert Behavior During Therapy: [facility] for tasks assessed/performed Overall Cognitive Status: Within Functional Limits for tasks assessed    Mobility  Bed Mobility Bed Mobility: Supine to Sit;Sitting - Scoot to Edge of Bed Supine to Sit: 6: Modified independent (Device/Increase time);With rails;HOB elevated Sitting - Scoot to Edge of Bed: 6: Modified independent (Device/Increase time);With rail Transfers Transfers: Sit to Stand;Stand to Sit Sit to Stand: 4: Min guard;With upper extremity assist;From bed Stand to Sit: 4: Min guard;With upper extremity assist;With armrests;To chair/3-in-1          End of Session OT - End of Session Equipment Utilized During Treatment: Rolling walker;Left knee immobilizer Activity Tolerance: Patient tolerated treatment well Patient left: in chair;with call bell/phone within reach;with family/visitor present       Almon Register [phone] 03/10/2013, 2:05 PM

## 2013-03-10 NOTE — Progress Notes (Signed)
Clinical Social Work Department CLINICAL SOCIAL WORK PLACEMENT NOTE 03/10/2013  Patient:  SHAWNIE, NICOLE  Account Number:  [id number] Admit date:  03/02/2013  Clinical Social Worker:  Berton Mount, Latanya Presser  Date/time:  03/10/2013 12:00 N  Clinical Social Work is seeking post-discharge placement for this patient at the following level of care:   SKILLED NURSING   (*CSW will update this form in Epic as items are completed)   03/10/2013  Patient/family provided with [place] Department of Clinical Social Work's list of facilities offering this level of care within the geographic area requested by the patient (or if unable, by the patient's family).  03/10/2013  Patient/family informed of their freedom to choose among providers that offer the needed level of care, that participate in Medicare, Medicaid or managed care program needed by the patient, have an available bed and are willing to accept the patient.  03/10/2013  Patient/family informed of [facility]' ownership interest in [facility], as well as of the fact that they are under no obligation to receive care at this facility.  PASARR submitted to EDS on 03/10/2013 PASARR number received from EDS on 03/10/2013  FL2 transmitted to all facilities in geographic area requested by pt/family on  03/10/2013 FL2 transmitted to all facilities within larger geographic area on   Patient informed that his/her managed care company has contracts with or will negotiate with  certain facilities, including the following:     Patient/family informed of bed offers received:   Patient chooses bed at  Physician recommends and patient chooses bed at    Patient to be transferred to  on   Patient to be transferred to facility by   The following physician request were entered in Epic:   Additional CommentsBerton Mount, [place]

## 2013-03-10 NOTE — Progress Notes (Signed)
CSW (Clinical Education officer, museum) prepared pt dc packet and placed with shadow chart. CSW arranged non-emergent ambulance transport. Pt, pt nurse, and facility informed. CSW signing off.  [place], [place]

## 2013-03-10 NOTE — Progress Notes (Signed)
Physical Therapy Treatment Patient Details Name: Adrienne [name] MRN: [id number] DOB: 01/17/1956 Today's Date: 03/10/2013 Time: 9833-8250 PT Time Calculation (min): 19 min  PT Assessment / Plan / Recommendation  History of Present Illness Admitted with left tibial plateau fx surgically repaired on 03/07/13. Pt is NWB on LLE. LUE shoulder soreness due to tendonitis from over use on RW per MD (s/p) shot this admission   PT Comments   Pt progressing towards physical therapy goals. Pt reports that she is to d/c to SNF, as she is unable to go to mother's home as she originally anticipated. Fatigue limiting pt throughout session, as she states she has been up to the bathroom multiple times this afternoon. Overall good rehab effort.  Follow Up Recommendations  SNF     Does the patient have the potential to tolerate intense rehabilitation     Barriers to Discharge        Equipment Recommendations  Other (comment) (TBD by next venue of care)    Recommendations for Other Services    Frequency Min 5X/week   Progress towards PT Goals Progress towards PT goals: Progressing toward goals  Plan Current plan remains appropriate    Precautions / Restrictions Precautions Precautions: Fall Required Braces or Orthoses: Knee Immobilizer - Left Knee Immobilizer - Left: On at all times Restrictions Weight Bearing Restrictions: Yes LLE Weight Bearing: Non weight bearing   Pertinent Vitals/Pain Pt reports 8/10 pain after session; pt repositioned for comfort.     Mobility  Bed Mobility Bed Mobility: Supine to Sit;Sitting - Scoot to [employer] of Bed;Sit to Supine;Scooting to [facility] Supine to Sit: 6: Modified independent (Device/Increase time);With rails;HOB elevated Sitting - Scoot to Edge of Bed: 6: Modified independent (Device/Increase time);With rail Sit to Supine: 4: Min assist Scooting to [facility]: 4: Min guard Details for Bed Mobility Assistance: Assist for support and elevation of LLE back into bed.   Transfers Transfers: Sit to Stand;Stand to Sit Sit to Stand: 4: Min guard;With upper extremity assist;From bed Stand to Sit: 4: Min guard;With upper extremity assist;With armrests;To chair/3-in-1 Details for Transfer Assistance: VC's for hand placement on seated surface for safety.  Ambulation/Gait Ambulation/Gait Assistance: 4: Min guard Ambulation Distance (Feet): 15 Feet Assistive device: Rolling walker Ambulation/Gait Assistance Details: Pt very fatigued at end of ambulation and required cues to wait until properly positioned before initiating stand>sit. Maintained NWB status well.  Gait Pattern: Step-to pattern Gait velocity: Decreased Stairs: No Wheelchair Mobility Wheelchair Mobility: No    Exercises Other Exercises Other Exercises: Pt unwilling to attempt exercise due to fatigue and pain   PT Diagnosis:    PT Problem List:   PT Treatment Interventions:     PT Goals (current goals can now be found in the care plan section) Acute Rehab PT Goals Patient Stated Goal: pt would like to go to her mother's house at DC PT Goal Formulation: With patient Time For Goal Achievement: 03/10/13 Potential to Achieve Goals: Good  Visit Information  Last PT Received On: 03/10/13 Assistance Needed: +1 History of Present Illness: Admitted with left tibial plateau fx surgically repaired on 03/07/13. Pt is NWB on LLE. LUE shoulder soreness due to tendonitis from over use on RW per MD (s/p) shot this admission    Subjective Data  Subjective: "I might go to rehab today." Patient Stated Goal: pt would like to go to her mother's house at [place]: Awake/alert Behavior During Therapy: [facility] for tasks assessed/performed Overall Cognitive Status: Within  Functional Limits for tasks assessed    Balance     End of Session PT - End of Session Equipment Utilized During Treatment: Gait belt;Left knee immobilizer Activity Tolerance: Patient limited by  pain Patient left: in bed;with call bell/phone within reach;with family/visitor present Nurse Communication: Mobility status   GP     Adrienne [name] 03/10/2013, 4:18 PM  Adrienne [name], [place], DPT [phone]

## 2013-03-10 NOTE — Progress Notes (Signed)
CSW (Clinical Education officer, museum) spoke with Cabin crew at [facility]. Pt is able to dc there today if medically stable.  [place], [place]

## 2013-03-13 ENCOUNTER — Other Ambulatory Visit: Payer: Self-pay | Admitting: *Deleted

## 2013-03-13 ENCOUNTER — Telehealth (HOSPITAL_COMMUNITY): Payer: Self-pay | Admitting: Dietician

## 2013-03-13 ENCOUNTER — Non-Acute Institutional Stay (SKILLED_NURSING_FACILITY): Payer: Medicare Other | Admitting: Internal Medicine

## 2013-03-13 ENCOUNTER — Encounter (HOSPITAL_COMMUNITY): Payer: Self-pay | Admitting: Orthopedic Surgery

## 2013-03-13 DIAGNOSIS — S8290XD Unspecified fracture of unspecified lower leg, subsequent encounter for closed fracture with routine healing: Secondary | ICD-10-CM

## 2013-03-13 DIAGNOSIS — S82142F Displaced bicondylar fracture of left tibia, subsequent encounter for open fracture type IIIA, IIIB, or IIIC with routine healing: Secondary | ICD-10-CM

## 2013-03-13 DIAGNOSIS — IMO0001 Reserved for inherently not codable concepts without codable children: Secondary | ICD-10-CM

## 2013-03-13 DIAGNOSIS — E1165 Type 2 diabetes mellitus with hyperglycemia: Secondary | ICD-10-CM

## 2013-03-13 DIAGNOSIS — R569 Unspecified convulsions: Secondary | ICD-10-CM

## 2013-03-13 LAB — GLUCOSE, CAPILLARY
GLUCOSE-CAPILLARY: 122 mg/dL — AB (ref 70–99)
GLUCOSE-CAPILLARY: 171 mg/dL — AB (ref 70–99)
Glucose-Capillary: 124 mg/dL — ABNORMAL HIGH (ref 70–99)
Glucose-Capillary: 104 mg/dL — ABNORMAL HIGH (ref 70–99)
Glucose-Capillary: 189 mg/dL — ABNORMAL HIGH (ref 70–99)

## 2013-03-13 MED ORDER — OXYCODONE-ACETAMINOPHEN 5-325 MG PO TABS: 2.0000 | ORAL_TABLET | ORAL | Status: DC | PRN

## 2013-03-13 MED ORDER — METHYLPHENIDATE HCL 10 MG PO TABS: 10.0000 mg | ORAL_TABLET | Freq: Two times a day (BID) | ORAL | Status: DC

## 2013-03-13 MED ORDER — ALPRAZOLAM 1 MG PO TABS: 1.0000 mg | ORAL_TABLET | Freq: Every evening | ORAL | Status: DC | PRN

## 2013-03-13 NOTE — Telephone Encounter (Signed)
Received referral from inpatient diabetes coordinator for outpatient diabetes education. Noted that pt was d/c from [facility] and currently resides at [facility]. Will defer education to facility. Will be happy to follow-up with pt after discharge if needed.

## 2013-03-13 NOTE — Progress Notes (Signed)
Patient ID: Adrienne [name], female   DOB: 1956-02-20, 58 y.o.   MRN: [id number]  Facility; [facility] Chief complaint; admission to SNF post admit to [place] from 12/25 to 1/2  History; this is a 58 year old woman who fell while walking down the stairs. She could not weight-bear and eventually was admitted to hospital after some attempts to manage this at home. She was discovered to have a left tibial plateau fracture and underwent an ORIF. She was on ASA for DVT prophylaxis and Percocet for pain. She was discovered to have elevated CBGs in the hospital and hemoglobin A1c of 7.5. She tells me she had a similar presentation when she underwent elective right hip surgery in 2010 but she is not a known diabetic . She was treated for cellulitis of the left arm followup duplex ultrasound was negative for DVT she was initially on vancomycin changed to doxycycline.   has a past medical history of Allergic rhinitis; Anemia; Asthma; Depression; GERD (gastroesophageal reflux disease); Low back pain; Seizure disorder; Macular degeneration, bilateral; Arthritis; migraines; Brain injury; OSA (obstructive sleep apnea); RLS (restless legs syndrome); NIDDM (non-insulin dependent diabetes mellitus); Status post right hip replacement; and Status post rotator cuff surgery.  Past Surgical History  Procedure Laterality Date  . Colonoscopy  12/08    TCS/EGD: + Barrett's,+ gastritis, friable anal canal, hyperplastic polyp  . Appendectomy    . Vesicovaginal fistula closure w/ tah    . Rotator cuff repair      Bilaterally  . Revision total hip arthroplasty      Right  . Tonsillectomy    . Fracture surgery Left 03/07/2013    knee   Discharge medications; Xanax 1 tablet each bedtime when necessary, enteric-coated aspirin 325 daily, biotin one capsule by mouth daily, Flexeril 10 mg each bedtime, Pristiq 100 mg daily, doxycycline 100 twice a day, fish oil 2 g daily, Lasix 20 daily, Lasix 20 mg daily, NovoLog sliding scale  a.c. meals, melatonin 10 mg at bedtime, Ritalin 10 mg by mouth twice a day, Prilosec 20 daily, MiraLax 17 g daily, Percocet 2 tablets every 4 when necessary, Senokot-S 2 tabs daily, vitamin B12 orally, vitamin D3 400 international units daily, vitamin E 1000 units daily  Social history; patient lives with a roommate. There are a few stairs to get into the home however the patient will go to her mother's home where she went after her right total hip replacement. She is a Research scientist (physical sciences). Previously independent with ADLs and IADL  reports that she quit smoking about 3 months ago. Her smoking use included Cigarettes. She started smoking about 42 years ago. She has a 40 pack-year smoking history. She has never used smokeless tobacco. She reports that she does not drink alcohol or use illicit drugs.  family history includes Atrial fibrillation in her mother; Cancer in her maternal grandfather and mother; Diabetes in her maternal grandfather; Thyroid disease in her son.  Review of systems Respiratory has a history of asthma but no cough or shortness of breath Cardiac no chest pain or palpitations GI is having daily bowel movements no diarrhea GU no dysuria no hematuria Neurologic history of seizures however she has not had one in over 5years Endocrine; once again hemoglobin A1c was 7.5. She is a likely diabetic however I have explained to her that she likely will require diabetic teaching but perhaps not any treatment at present  Physical examination Gen. patient is not in any distress Vital signs; O2 sat is 97% on room  air respirations 18 pulse rate 76 Respiratory clear entry bilaterally Cardiac heart sounds are normal there is no murmurs Abdomen; no liver no spleen no tenderness GU bladder is nondistended Musculoskeletal surgical incision looks well approximated no evidence of infection  Impression/plan #1 left tibial plateau fracture after a fall downstairs. Surgical site is well-healed. She is on  aspirin for DVT prophylaxis apparently is already up ambulating with the assist of a walker #2 new-onset diabetes with a hemoglobin A1c of 7.5 we'll begin diabetic teaching however I'm not certain she will need any ongoing treatment at this point #3 history of migraines, had a headache yesterday states it is getting better #4 remote seizure disorder #5 history of depression with anxiety. She is on Ritalin I am not really sure of the indication here however as her stay here is likely to be short I am not planning to interject into her psychiatric medication

## 2013-03-14 LAB — GLUCOSE, CAPILLARY
GLUCOSE-CAPILLARY: 116 mg/dL — AB (ref 70–99)
Glucose-Capillary: 108 mg/dL — ABNORMAL HIGH (ref 70–99)
Glucose-Capillary: 193 mg/dL — ABNORMAL HIGH (ref 70–99)
Glucose-Capillary: 195 mg/dL — ABNORMAL HIGH (ref 70–99)

## 2013-03-15 LAB — GLUCOSE, CAPILLARY
GLUCOSE-CAPILLARY: 116 mg/dL — AB (ref 70–99)
GLUCOSE-CAPILLARY: 108 mg/dL — AB (ref 70–99)
Glucose-Capillary: 156 mg/dL — ABNORMAL HIGH (ref 70–99)
Glucose-Capillary: 180 mg/dL — ABNORMAL HIGH (ref 70–99)

## 2013-03-16 LAB — GLUCOSE, CAPILLARY
Glucose-Capillary: 100 mg/dL — ABNORMAL HIGH (ref 70–99)
Glucose-Capillary: 108 mg/dL — ABNORMAL HIGH (ref 70–99)
Glucose-Capillary: 162 mg/dL — ABNORMAL HIGH (ref 70–99)

## 2013-03-17 LAB — GLUCOSE, CAPILLARY
GLUCOSE-CAPILLARY: 147 mg/dL — AB (ref 70–99)
GLUCOSE-CAPILLARY: 142 mg/dL — AB (ref 70–99)
Glucose-Capillary: 144 mg/dL — ABNORMAL HIGH (ref 70–99)
Glucose-Capillary: 98 mg/dL (ref 70–99)
Glucose-Capillary: 112 mg/dL — ABNORMAL HIGH (ref 70–99)

## 2013-03-18 LAB — GLUCOSE, CAPILLARY
GLUCOSE-CAPILLARY: 117 mg/dL — AB (ref 70–99)
Glucose-Capillary: 133 mg/dL — ABNORMAL HIGH (ref 70–99)
Glucose-Capillary: 132 mg/dL — ABNORMAL HIGH (ref 70–99)
Glucose-Capillary: 154 mg/dL — ABNORMAL HIGH (ref 70–99)

## 2013-03-19 LAB — GLUCOSE, CAPILLARY
GLUCOSE-CAPILLARY: 161 mg/dL — AB (ref 70–99)
GLUCOSE-CAPILLARY: 177 mg/dL — AB (ref 70–99)
Glucose-Capillary: 110 mg/dL — ABNORMAL HIGH (ref 70–99)
Glucose-Capillary: 108 mg/dL — ABNORMAL HIGH (ref 70–99)

## 2013-03-20 ENCOUNTER — Non-Acute Institutional Stay (SKILLED_NURSING_FACILITY): Payer: Medicare Other | Admitting: Internal Medicine

## 2013-03-20 DIAGNOSIS — E1165 Type 2 diabetes mellitus with hyperglycemia: Principal | ICD-10-CM

## 2013-03-20 DIAGNOSIS — S82109A Unspecified fracture of upper end of unspecified tibia, initial encounter for closed fracture: Secondary | ICD-10-CM

## 2013-03-20 DIAGNOSIS — IMO0001 Reserved for inherently not codable concepts without codable children: Secondary | ICD-10-CM

## 2013-03-20 DIAGNOSIS — S82143A Displaced bicondylar fracture of unspecified tibia, initial encounter for closed fracture: Secondary | ICD-10-CM

## 2013-03-20 LAB — GLUCOSE, CAPILLARY
GLUCOSE-CAPILLARY: 114 mg/dL — AB (ref 70–99)
Glucose-Capillary: 135 mg/dL — ABNORMAL HIGH (ref 70–99)

## 2013-03-22 ENCOUNTER — Encounter (HOSPITAL_COMMUNITY): Payer: Self-pay | Admitting: Orthopedic Surgery

## 2013-03-27 NOTE — Progress Notes (Signed)
Patient ID: Adrienne [name], female   DOB: 1955-12-26, 58 y.o.   MRN: [id number]              PROGRESS NOTE  DATE:  03/20/2013    FACILITY: [facility]    LEVEL OF CARE:   SNF   Acute Visit/Discharge Visit     CHIEF COMPLAINT:  Pre-discharge review.     HISTORY OF PRESENT ILLNESS:  This is a 58 year-old woman who fell while walking down stairs with groceries in her arms.  She was discovered to have a left tibial plateau fracture and underwent an ORIF.  She is on aspirin for DVT prophylaxis.    In the hospital, she had a hemoglobin A1c of 7.5 (not a previously known diabetic).  I have reviewed her blood sugars in the facility and they are generally in the 108-120 range.  The maximum I see is a fasting of 147.    The patient has actually done quite well.  She is going home to her mother's.  She has an upstairs to handle.  She is still not weightbearing on the left leg.  She has a knee immobilizer on.    PHYSICAL EXAMINATION:   MUSCULOSKELETAL:   EXTREMITIES:   LEFT LOWER EXTREMITY:  Her left leg incision is well healed.  There is no evidence of a DVT.    ASSESSMENT/PLAN:  Left tibial plateau fracture.  She will go home with a wheelchair lightweight, which I have written.  She has a walker at home.    Type 2 diabetes.  Hemoglobin A1c is 7.5.  That certainly qualifies her as a diabetic.  I think she can come off the sliding scale insulin.  I have asked her to follow up with this with her primary physician.  Hopefully, she has already had some diabetic teaching.    CPT CODE: 46503

## 2013-04-14 ENCOUNTER — Encounter: Payer: Self-pay | Admitting: Family Medicine

## 2013-04-14 ENCOUNTER — Ambulatory Visit (INDEPENDENT_AMBULATORY_CARE_PROVIDER_SITE_OTHER): Payer: Medicare Other | Admitting: Family Medicine

## 2013-04-14 VITALS — BP 124/80 | HR 111 | Temp 98.7°F | Resp 20

## 2013-04-14 DIAGNOSIS — S0990XA Unspecified injury of head, initial encounter: Secondary | ICD-10-CM

## 2013-04-14 DIAGNOSIS — R5383 Other fatigue: Secondary | ICD-10-CM

## 2013-04-14 DIAGNOSIS — F3289 Other specified depressive episodes: Secondary | ICD-10-CM

## 2013-04-14 DIAGNOSIS — J45909 Unspecified asthma, uncomplicated: Secondary | ICD-10-CM

## 2013-04-14 DIAGNOSIS — E1165 Type 2 diabetes mellitus with hyperglycemia: Secondary | ICD-10-CM

## 2013-04-14 DIAGNOSIS — K219 Gastro-esophageal reflux disease without esophagitis: Secondary | ICD-10-CM

## 2013-04-14 DIAGNOSIS — D649 Anemia, unspecified: Secondary | ICD-10-CM

## 2013-04-14 DIAGNOSIS — R5381 Other malaise: Secondary | ICD-10-CM

## 2013-04-14 DIAGNOSIS — IMO0001 Reserved for inherently not codable concepts without codable children: Secondary | ICD-10-CM

## 2013-04-14 DIAGNOSIS — K2270 Barrett's esophagus without dysplasia: Secondary | ICD-10-CM

## 2013-04-14 DIAGNOSIS — E785 Hyperlipidemia, unspecified: Secondary | ICD-10-CM

## 2013-04-14 DIAGNOSIS — E538 Deficiency of other specified B group vitamins: Secondary | ICD-10-CM

## 2013-04-14 DIAGNOSIS — F329 Major depressive disorder, single episode, unspecified: Secondary | ICD-10-CM

## 2013-04-14 DIAGNOSIS — R569 Unspecified convulsions: Secondary | ICD-10-CM

## 2013-04-14 LAB — GLUCOSE, POCT (MANUAL RESULT ENTRY): POC Glucose: 101 mg/dl — AB (ref 70–99)

## 2013-04-14 MED ORDER — METFORMIN HCL 500 MG PO TABS: 500.0000 mg | ORAL_TABLET | Freq: Two times a day (BID) | ORAL | Status: DC

## 2013-04-14 MED ORDER — ATORVASTATIN CALCIUM 10 MG PO TABS: 20.0000 mg | ORAL_TABLET | Freq: Every day | ORAL | Status: DC

## 2013-04-14 MED ORDER — OMEPRAZOLE 20 MG PO CPDR
20.0000 mg | DELAYED_RELEASE_CAPSULE | Freq: Every day | ORAL | Status: DC
Start: 2013-04-14 — End: 2015-01-11

## 2013-04-14 NOTE — Progress Notes (Signed)
Subjective:    Patient ID: Adrienne [name], female    DOB: 1956/03/04, 58 y.o.   MRN: [id number]  HPI Patient is here today to establish care. I actually saw this patient in the emergency department when I was admitting her to the hospital after she had had a tibial fracture. The hospitalist service was doing her medical management while orthopedic surgery managed her fracture. She's been following up with Adrienne [name] for her tibial plateau fracture and is doing quite well.  She has a new onset diabetes discovered when she was admitted to the hospital. She had a little bit of diabetes education in the hospital but still remains confused. She does not have a meter at home. She's not on any medications. In the hospital she was on sliding scale. Her mother has been doing a lot of cooking for her and has been giving her very small salads and also seems unclear on what she needs to be eating. The sister who is a nurse feels that the mother is not feeding the patient and off.  The patient carries a diagnosis of asthma although she feels this is questionable. She's never needed to use an inhaler and does not have a problem with either wheezing or coughing. She quit smoking 3 months ago and said she did have increased coughing immediately after quitting but is breathing very well now doesn't have any symptoms of asthma.  She is a history of depression and see psychiatry approximately every 6 weeks. They managed his medications. She understands that we do not and will not manage any psychiatric medications here.  There is a question of left upper extremity cellulitis on discharge. He was treated with doxycycline. The sister thinks it was because vancomycin was running into that IV distal to that site. Either way the erythema and warmth has resolved and the patient is now off the doxycycline. She's not having any pain in that area.  She has a history of closed head injury. She is to followup with neurology but  has not done so for approximately 8 years. After her closed head injury she ceased to be able to [name] and is now on disability.  She has a history of Barrett's esophagus. She would like to followup with Adrienne [name] for this. She currently takes a PPI for her Barrett's esophagus along with her GERD.  She tells me that in the past she followed up with the hematologist do to "a problem with a platelet". Looking at her recent CBCs her platelets look fine. She may have a slight anemia.  The patient has been taking multiple supplements and vitamins and asks if she can stop taking some of these.   Review of Systems A 12 point review of systems is negative except as per hpi.       Objective:   Physical Exam Nursing note and vitals reviewed. patient is a wheelchair due 2 broken leg Constitutional: She is oriented to person, place, and time. She appears well-developed and well-nourished.  HENT:  Right Ear: External ear normal.  Left Ear: External ear normal.  Nose: Nose normal.  Mouth/Throat: Oropharynx is clear and moist. No oropharyngeal exudate.  Eyes: Conjunctivae are normal. Pupils are equal, round, and reactive to light.  Neck: Normal range of motion. Neck supple. No thyromegaly present.  Cardiovascular: Normal rate, regular rhythm and normal heart sounds.   Pulmonary/Chest: Effort normal and breath sounds normal.  Abdominal: Soft. Bowel sounds are normal. She exhibits no distension. There  is no tenderness. There is no rebound.  Lymphadenopathy:    She has no cervical adenopathy.  Neurological: She is alert and oriented to person, place, and time. She has normal reflexes.  Skin: Skin is warm and dry.  Psychiatric: She has a normal mood and affect. Her behavior is normal.  Note left lower extremity incompletely evaluated due 2 fracture. Also unable to move the patient onto the exam table due to being in a wheelchair therefore some exams including abdominal and incomplete.         Assessment & Plan:  Adrienne [name] was seen today for new patient.  Diagnoses and associated orders for this visit:  Barrett's esophagus - omeprazole (PRILOSEC) 20 MG capsule; Take 1 capsule (20 mg total) by mouth daily. - Ambulatory referral to Gastroenterology  GERD - omeprazole (PRILOSEC) 20 MG capsule; Take 1 capsule (20 mg total) by mouth daily. - Ambulatory referral to Gastroenterology  DIABETES MELLITUS, TYPE II, UNCONTROLLED - metFORMIN (GLUCOPHAGE) 500 MG tablet; Take 1 tablet (500 mg total) by mouth 2 (two) times daily with a meal. - Ambulatory referral to diabetic education - atorvastatin (LIPITOR) 10 MG tablet; Take 2 tablets (20 mg total) by mouth daily. - POCT glucose (manual entry) Glucose today was 101. Her most recent A1c was 7. A centimeter and have asked her to check her first morning sugar. We'll start her on a low dose of metformin to ASTHMA It doesn't sound like asthma is much problem for the patient at this time. If it begins to bother her she'll let us know per ANEMIA-NOS - Ambulatory referral to Hematology  DEPRESSION Continue to follow with psychiatry HEAD TRAUMA, CLOSED - Ambulatory referral to Neurology  SEIZURE DISORDER Follow with neurology   Other and unspecified hyperlipidemia - atorvastatin (LIPITOR) 10 MG tablet; Take 2 tablets (20 mg total) by mouth daily.  I've asked the patient to continue taking aspirin due to her diabetes. She is unclear if Adrienne [name] wanted her on aspirin. She was discharged on 325 daily. I've asked her to check with his office regarding 325 versus 81 for their purposes. Once orthopedics doesn't need her on aspirin anymore bed like her to take 81 mg enteric-coated daily. She can stop taking the cinnamon. Cyclobenzaprine is managed by her psychiatrist. She can stop taking the flaxseed it should like. As long as she is eating a healthy diet which seems that she is she can stop taking the multivitamin and minerals. She understands  that we will not be prescribing Percocet here normal we managed to proceed with the Ritalin. Since MiraLax works well for her she does not need to have the stomach also. She takes daily vitamin D3. Vitamin D level was 47 recently. I've asked her to take calcium with D. at least every other day. Her mother had started her on vitamin D and I think she can discontinue this just fine. Psychiatry will manage her Xanax. She can stop the Biaxin (same situation is vitamin E). She started the vitamin B12 due to feeling tired. She's never been told she had a vitamin B12 deficiency. Will check his B12 level and if high she can stop the B12 as well.  I'll plan to see this patient back in 6 weeks for CPE and followup. At that time we'll check an A1c and see if we need to make more adjustments in the medication for diabetes. In the meanwhile to call with any questions or concerns or proceed to the ER with acute issues.

## 2013-04-14 NOTE — Patient Instructions (Signed)

## 2013-04-19 ENCOUNTER — Encounter: Payer: Self-pay | Admitting: Internal Medicine

## 2013-05-01 ENCOUNTER — Ambulatory Visit: Payer: Medicare Other | Admitting: Family Medicine

## 2013-05-01 ENCOUNTER — Ambulatory Visit: Payer: BC Managed Care – PPO | Admitting: Gastroenterology

## 2013-05-03 ENCOUNTER — Ambulatory Visit: Payer: Medicare Other | Admitting: Diagnostic Neuroimaging

## 2013-05-23 ENCOUNTER — Encounter (HOSPITAL_COMMUNITY): Payer: Medicare Other | Attending: Hematology and Oncology

## 2013-05-23 ENCOUNTER — Encounter (HOSPITAL_COMMUNITY): Payer: Self-pay

## 2013-05-23 VITALS — BP 137/65 | HR 88 | Temp 99.6°F | Resp 18 | Ht 65.0 in | Wt 196.6 lb

## 2013-05-23 DIAGNOSIS — F319 Bipolar disorder, unspecified: Secondary | ICD-10-CM | POA: Diagnosis not present

## 2013-05-23 DIAGNOSIS — G40802 Other epilepsy, not intractable, without status epilepticus: Secondary | ICD-10-CM | POA: Insufficient documentation

## 2013-05-23 DIAGNOSIS — D649 Anemia, unspecified: Secondary | ICD-10-CM

## 2013-05-23 DIAGNOSIS — S82142A Displaced bicondylar fracture of left tibia, initial encounter for closed fracture: Secondary | ICD-10-CM

## 2013-05-23 DIAGNOSIS — E1165 Type 2 diabetes mellitus with hyperglycemia: Secondary | ICD-10-CM

## 2013-05-23 DIAGNOSIS — K2270 Barrett's esophagus without dysplasia: Secondary | ICD-10-CM | POA: Insufficient documentation

## 2013-05-23 DIAGNOSIS — J309 Allergic rhinitis, unspecified: Secondary | ICD-10-CM

## 2013-05-23 DIAGNOSIS — D62 Acute posthemorrhagic anemia: Secondary | ICD-10-CM | POA: Insufficient documentation

## 2013-05-23 DIAGNOSIS — H3530 Unspecified macular degeneration: Secondary | ICD-10-CM

## 2013-05-23 DIAGNOSIS — Z87891 Personal history of nicotine dependence: Secondary | ICD-10-CM | POA: Diagnosis not present

## 2013-05-23 DIAGNOSIS — Z96649 Presence of unspecified artificial hip joint: Secondary | ICD-10-CM | POA: Diagnosis not present

## 2013-05-23 DIAGNOSIS — K219 Gastro-esophageal reflux disease without esophagitis: Secondary | ICD-10-CM | POA: Insufficient documentation

## 2013-05-23 DIAGNOSIS — G4730 Sleep apnea, unspecified: Secondary | ICD-10-CM

## 2013-05-23 DIAGNOSIS — J449 Chronic obstructive pulmonary disease, unspecified: Secondary | ICD-10-CM | POA: Diagnosis not present

## 2013-05-23 DIAGNOSIS — IMO0001 Reserved for inherently not codable concepts without codable children: Secondary | ICD-10-CM

## 2013-05-23 DIAGNOSIS — E119 Type 2 diabetes mellitus without complications: Secondary | ICD-10-CM | POA: Insufficient documentation

## 2013-05-23 DIAGNOSIS — J4489 Other specified chronic obstructive pulmonary disease: Secondary | ICD-10-CM | POA: Insufficient documentation

## 2013-05-23 DIAGNOSIS — G4733 Obstructive sleep apnea (adult) (pediatric): Secondary | ICD-10-CM | POA: Diagnosis not present

## 2013-05-23 LAB — CBC WITH DIFFERENTIAL/PLATELET
BASOS PCT: 1 % (ref 0–1)
Basophils Absolute: 0.1 10*3/uL (ref 0.0–0.1)
EOS PCT: 1 % (ref 0–5)
Eosinophils Absolute: 0.1 10*3/uL (ref 0.0–0.7)
HCT: 45.2 % (ref 36.0–46.0)
Hemoglobin: 14.9 g/dL (ref 12.0–15.0)
LYMPHS ABS: 3.3 10*3/uL (ref 0.7–4.0)
Lymphocytes Relative: 35 % (ref 12–46)
MCH: 31 pg (ref 26.0–34.0)
MCHC: 33 g/dL (ref 30.0–36.0)
MCV: 94.2 fL (ref 78.0–100.0)
Monocytes Absolute: 0.8 10*3/uL (ref 0.1–1.0)
Monocytes Relative: 8 % (ref 3–12)
Neutro Abs: 5.1 10*3/uL (ref 1.7–7.7)
Neutrophils Relative %: 55 % (ref 43–77)
Platelets: 294 10*3/uL (ref 150–400)
RBC: 4.8 MIL/uL (ref 3.87–5.11)
RDW: 14.3 % (ref 11.5–15.5)
WBC: 9.3 10*3/uL (ref 4.0–10.5)

## 2013-05-23 LAB — BASIC METABOLIC PANEL
BUN: 18 mg/dL (ref 6–23)
CHLORIDE: 101 meq/L (ref 96–112)
CO2: 24 mEq/L (ref 19–32)
CREATININE: 0.54 mg/dL (ref 0.50–1.10)
Calcium: 9.6 mg/dL (ref 8.4–10.5)
GFR calc Af Amer: >90 mL/min (ref >90)
GFR calc non Af Amer: >90 mL/min (ref >90)
GLUCOSE: 155 mg/dL — AB (ref 70–99)
POTASSIUM: 4.5 meq/L (ref 3.7–5.3)
Sodium: 138 mEq/L (ref 137–147)

## 2013-05-23 LAB — RETICULOCYTES
RBC.: 4.8 MIL/uL (ref 3.87–5.11)
Retic Count, Absolute: 96 10*3/uL (ref 19.0–186.0)
Retic Ct Pct: 2 % (ref 0.4–3.1)

## 2013-05-23 LAB — IRON AND TIBC
IRON: 68 ug/dL (ref 42–135)
Saturation Ratios: 22 % (ref 20–55)
TIBC: 312 ug/dL (ref 250–470)
UIBC: 244 ug/dL (ref 125–400)

## 2013-05-23 LAB — HAPTOGLOBIN: Haptoglobin: 218 mg/dL — ABNORMAL HIGH (ref 45–215)

## 2013-05-23 NOTE — Progress Notes (Signed)
Adrienne [name], M.D.  NEW PATIENT EVALUATION   Name: Adrienne [name] Date: 05/24/2013 MRN: [id number] DOB: [birth year]  PCP: Doran Heater, MD   REFERRING PHYSICIAN: No ref. provider found  REASON FOR REFERRAL: Anemia     HISTORY OF PRESENT ILLNESS:Adrienne [name] is a 58 y.o. female who is referred for evaluation of anemia with a previous history in the past of leukocytosis and elevated hemoglobin. She recently sustained a plateau fracture of the tibia and underwent open reduction and internal fixation on 03/07/2013. She spent time in rehabilitation and is currently living with her mom. She intends to go back home in another month. Appetite has been good. She stopped smoking 3 months ago. She denies any PND, orthopnea, palpitations, headache, easy satiety, but does have chronic left lower 70 swelling with a brace on the left knee. She denies any cough, expectoration, epistaxis, melena, hematochezia, hematuria, hemoptysis, or vaginal bleeding. She denies any skin rash, headache, or seizures.    PAST MEDICAL HISTORY:  has a past medical history of Allergic rhinitis; Anemia; Asthma; Depression; GERD (gastroesophageal reflux disease); Low back pain; Seizure disorder; Macular degeneration, bilateral; Arthritis; migraines; Brain injury; OSA (obstructive sleep apnea); RLS (restless legs syndrome); NIDDM (non-insulin dependent diabetes mellitus); Status post right hip replacement; Status post rotator cuff surgery; and Closed head injury (2005).   She was originally seen at the [facility] on 08/09/2001 at which time she was found to have a mild elevation of hemoglobin and white blood cell count down to probably be reactive with no evidence of primary bone marrow disorder. The abnormalities were attributed to chronic smoking. She was last seen on 05/10/2009 at which time WBC was 9.1 with hemoglobin of 16.5 and normal platelets.  Erythropoietin level was 18.7. Plan was to followup in one year but the patient was never seen after that.  PAST SURGICAL HISTORY: Past Surgical History  Procedure Laterality Date  . Colonoscopy  12/08    TCS/EGD: + Barrett's,+ gastritis, friable anal canal, hyperplastic polyp  . Appendectomy    . Vesicovaginal fistula closure w/ tah    . Rotator cuff repair      Bilaterally  . Revision total hip arthroplasty      Right  . Tonsillectomy    . Fracture surgery Left 03/07/2013    knee  . Orif tibia plateau Left 03/07/2013    Procedure: OPEN REDUCTION INTERNAL FIXATION (ORIF) TIBIAL PLATEAU;  Surgeon: Renette Butters, MD;  Location: [facility];  Service: Orthopedics;  Laterality: Left;  . Shoulder injection Left 03/07/2013    Procedure: SHOULDER INJECTION ; Depomedrol + 0.25% Marcaine mixture 4:1;  Surgeon: Renette Butters, MD;  Location: [facility];  Service: Orthopedics;  Laterality: Left;     CURRENT MEDICATIONS: has a current medication list which includes the following prescription(s): alprazolam, aspirin ec, biotin, vitamin d3, cyclobenzaprine, desvenlafaxine, furosemide, melatonin, metformin, methylphenidate, omega-3 acid ethyl esters, omeprazole, polyethylene glycol, atorvastatin, cinnamon, cyanocobalamin, flaxseed (linseed), hydrocodone-acetaminophen, multivitamin, oxycodone-acetaminophen, senna-docusate, and vitamin e.   ALLERGIES: Codeine; Morphine and related; Dilaudid; Penicillins; and Sulfonamide derivatives   SOCIAL HISTORY:  reports that she quit smoking about 5 months ago. Her smoking use included Cigarettes. She started smoking about 42 years ago. She has a 40 pack-year smoking history. She has never used smokeless tobacco. She reports that she does not drink alcohol or use illicit drugs.   FAMILY HISTORY: family history includes Atrial  fibrillation in her mother; Cancer in her maternal grandfather and mother; Diabetes in her maternal grandfather; Thyroid disease in her son.      REVIEW OF SYSTEMS:  Other than that discussed above is noncontributory.    PHYSICAL EXAM:  height is $RemoveB'5\' 5"'BHefyzhj$  (1.651 m) and weight is 196 lb 9.6 oz (89.177 kg). Her oral temperature is 99.6 F (37.6 C). Her blood pressure is 137/65 and her pulse is 88. Her respiration is 18.    GENERAL:alert, no distress and comfortable SKIN: skin color, texture, turgor are normal, no rashes or significant lesions EYES: normal, Conjunctiva are pink and non-injected, sclera clear OROPHARYNX:no exudate, no erythema and lips, buccal mucosa, and tongue normal  NECK: supple, thyroid normal size, non-tender, without nodularity CHEST: Increased AP diameter with no breast masses. LYMPH:  no palpable lymphadenopathy in the cervical, axillary or inguinal LUNGS: clear to auscultation and percussion with normal breathing effort HEART: regular rate & rhythm and no murmurs ABDOMEN:abdomen soft, non-tender and normal bowel sounds MUSCULOSKELETALl:no cyanosis of digits, no clubbing or edema. Left knee with a brace in place.  NEURO: alert & oriented x 3 with fluent speech, no focal motor/sensory deficits    LABORATORY DATA:   03/13/2006: JAK-2 mutation negative  Office Visit on 05/23/2013  Component Date Value Ref Range Status  . WBC 05/23/2013 9.3  4.0 - 10.5 K/uL Final  . RBC 05/23/2013 4.80  3.87 - 5.11 MIL/uL Final  . Hemoglobin 05/23/2013 14.9  12.0 - 15.0 g/dL Final  . HCT 05/23/2013 45.2  36.0 - 46.0 % Final  . MCV 05/23/2013 94.2  78.0 - 100.0 fL Final  . MCH 05/23/2013 31.0  26.0 - 34.0 pg Final  . MCHC 05/23/2013 33.0  30.0 - 36.0 g/dL Final  . RDW 05/23/2013 14.3  11.5 - 15.5 % Final  . Platelets 05/23/2013 294  150 - 400 K/uL Final  . Neutrophils Relative % 05/23/2013 55  43 - 77 % Final  . Neutro Abs 05/23/2013 5.1  1.7 - 7.7 K/uL Final  . Lymphocytes Relative 05/23/2013 35  12 - 46 % Final  . Lymphs Abs 05/23/2013 3.3  0.7 - 4.0 K/uL Final  . Monocytes Relative 05/23/2013 8  3 - 12 % Final  .  Monocytes Absolute 05/23/2013 0.8  0.1 - 1.0 K/uL Final  . Eosinophils Relative 05/23/2013 1  0 - 5 % Final  . Eosinophils Absolute 05/23/2013 0.1  0.0 - 0.7 K/uL Final  . Basophils Relative 05/23/2013 1  0 - 1 % Final  . Basophils Absolute 05/23/2013 0.1  0.0 - 0.1 K/uL Final  . Retic Ct Pct 05/23/2013 2.0  0.4 - 3.1 % Final  . RBC. 05/23/2013 4.80  3.87 - 5.11 MIL/uL Final  . Retic Count, Manual 05/23/2013 96.0  19.0 - 186.0 K/uL Final  . Sodium 05/23/2013 138  137 - 147 mEq/L Final  . Potassium 05/23/2013 4.5  3.7 - 5.3 mEq/L Final  . Chloride 05/23/2013 101  96 - 112 mEq/L Final  . CO2 05/23/2013 24  19 - 32 mEq/L Final  . Glucose, Bld 05/23/2013 155* 70 - 99 mg/dL Final  . BUN 05/23/2013 18  6 - 23 mg/dL Final  . Creatinine, Ser 05/23/2013 0.54  0.50 - 1.10 mg/dL Final  . Calcium 05/23/2013 9.6  8.4 - 10.5 mg/dL Final  . GFR calc non Af Amer 05/23/2013 >90  >90 mL/min Final  . GFR calc Af Amer 05/23/2013 >90  >90 mL/min Final   Comment: (  NOTE)                          The eGFR has been calculated using the CKD EPI equation.                          This calculation has not been validated in all clinical situations.                          eGFR's persistently <90 mL/min signify possible Chronic Kidney                          Disease.  . Vitamin B-12 05/23/2013 735  211 - 911 pg/mL Final   Performed at [facility]  . Folate 05/23/2013 >20.0   Final   Comment: (NOTE)                          Reference Ranges                                 Deficient:       0.4 - 3.3 ng/mL                                 Indeterminate:   3.4 - 5.4 ng/mL                                 Normal:              > 5.4 ng/mL                          Performed at [facility]  . Iron 05/23/2013 68  42 - 135 ug/dL Final  . TIBC 05/23/2013 312  250 - 470 ug/dL Final  . Saturation Ratios 05/23/2013 22  20 - 55 % Final  . UIBC 05/23/2013 244  125 - 400 ug/dL Final   Performed at [facility]  . Ferritin 05/23/2013 317* 10 - 291 ng/mL Final   Performed at [facility]  . Haptoglobin 05/23/2013 218* 45 - 215 mg/dL Final   Performed at [facility]  . DAT, complement 05/23/2013 NEG   Final  . DAT, IgG 05/23/2013    Final                   Value:NEG                         Performed at [facility]    Urinalysis    Component Value Date/Time   COLORURINE YELLOW 12/16/2007 [place] 12/16/2007 1404   LABSPEC 1.026 12/16/2007 1404   PHURINE 5.5 12/16/2007 1404   GLUCOSEU >1000* 12/16/2007 1404   HGBUR NEGATIVE 12/16/2007 [place] 12/16/2007 1404   KETONESUR NEGATIVE 12/16/2007 1404   PROTEINUR NEGATIVE 12/16/2007 1404   UROBILINOGEN 0.2 12/16/2007 1404   NITRITE NEGATIVE 12/16/2007 1404   LEUKOCYTESUR NEGATIVE 12/16/2007 1404      '@RADIOGRAPHY'$ : No results found.  PATHOLOGY:  FINAL for Knopf, Loletha Carrow  D [phone]) Patient: Haynes Hoehn D Collected: 12/19/2012 Client: [facility] Accession: [id number] Received: 12/19/2012 Worthy Flank, MD DOB: 09-Jul-1955 Age: 56 Gender: F Reported: 12/20/2012 [place] TDS 287 Patient Ph: [phone] MRN#: [id number] [address], [place] [address] Client Acc #: Chart: Phone: Fax: LMP: 0 Icd9(s): V76.2 Race: GYNECOLOGIC CYTOLOGY REPORT Adequacy Reason Satisfactory for evaluation, endocervical/transformation zone component PRESENT. Diagnosis NEGATIVE FOR INTRAEPITHELIAL LESIONS OR MALIGNANCY. RECOMMENDATION BASED ON THE CYTOLOGY INTERPRETATION, HPV TESTING WILL NOT BE PERFORMED. FOLLOW UP AS CLINICALLY INDICATED. IF HPV TESTING IS DESIRED, PLEASE CONTACT OUR LABORATORY ([phone]) IMMEDIATELY. VILIA MARSHALL Cytotechologist, Electronic Signature (Case signed 12/20/2012) Source CervicoVaginal Pap [ThinPrep Imaged] Note: The PAP test is a screening test, not a diagnostic procedure and should not be used as the sole means of detecting  cervical cancer. The pap technique in the best of hands is subject to both false negative and false positive result at low but significant rates as evidenced by published data. This result should be interpreted in the context of this data, plus your patient's history and clinical findings Report signed out from the following locations(s) Technical Component and Interpretation performed at [facility]. [address],[place],     IMPRESSION:  #1. Mild anemia secondary to acute blood loss from orthopedic surgery. #2. Status post left lower extremity plateau fracture, status post ORIF on 03/07/2013. #3. Chronic obstructive pulmonary disease, stop smoking 3 months ago. #4. Bipolar disorder, on treatment. #5. History in the past of closed head trauma, stable. #6. Seizure disorder, controlled. #7. Diabetes mellitus, type II, non-insulin requiring, controlled. #8. Gastroesophageal reflux disease with Barrett's esophagus, currently taking omeprazole. Chronic use of proton pump inhibitor may result in iron malabsorption contributing to anemia.    PLAN:  #1. Additional abscess was done today to help elucidate the cause of anemia. Previously noted elevated hemoglobin was probably due to smoking and since the patient has discontinued that habit and in light of recent orthopedic surgery, it is not unusual to develop a mild anemia. #2. Followup in 2 weeks with CBC.  I appreciate the opportunity of sharing in her care.   Doroteo Bradford, MD 05/24/2013 6:37 AM

## 2013-05-23 NOTE — Patient Instructions (Signed)
[facility] Discharge Instructions  Return in 2 weeks for repeat blood work (CBC only) and office visit.   Thank you for choosing [facility] to provide your oncology and hematology care.  To afford each patient quality time with our providers, please arrive at least 15 minutes before your scheduled appointment time.  With your help, our goal is to use those 15 minutes to complete the necessary work-up to ensure our physicians have the information they need to help with your evaluation and healthcare recommendations.    Effective January 1st, 2014, we ask that you re-schedule your appointment with our physicians should you arrive 10 or more minutes late for your appointment.  We strive to give you quality time with our providers, and arriving late affects you and other patients whose appointments are after yours.    Again, thank you for choosing [facility].  Our hope is that these requests will decrease the amount of time that you wait before being seen by our physicians.       _____________________________________________________________  Should you have questions after your visit to [facility], please contact our office at [phone] between the hours of 8:30 a.m. and 5:00 p.m.  Voicemails left after 4:30 p.m. will not be returned until the following business day.  For prescription refill requests, have your pharmacy contact our office with your prescription refill request.

## 2013-05-23 NOTE — Progress Notes (Signed)
Adrienne [name] presented for labwork. Labs per MD order drawn via Peripheral Line 21 gauge needle inserted in left AC.  Good blood return present. Procedure without incident.  Needle removed intact. Patient tolerated procedure well.

## 2013-05-24 LAB — VITAMIN B12: Vitamin B-12: 735 pg/mL (ref 211–911)

## 2013-05-24 LAB — DIRECT ANTIGLOBULIN TEST (NOT AT ARMC)
DAT, COMPLEMENT: NEGATIVE
DAT, IGG: NEGATIVE

## 2013-05-24 LAB — FOLATE: Folate: >20 ng/mL

## 2013-05-24 LAB — ANA: ANA: NEGATIVE

## 2013-05-24 LAB — FERRITIN: Ferritin: 317 ng/mL — ABNORMAL HIGH (ref 10–291)

## 2013-05-26 ENCOUNTER — Encounter: Payer: Self-pay | Admitting: Family Medicine

## 2013-05-26 ENCOUNTER — Ambulatory Visit (INDEPENDENT_AMBULATORY_CARE_PROVIDER_SITE_OTHER): Payer: Medicare Other | Admitting: Family Medicine

## 2013-05-26 VITALS — BP 132/74 | HR 97 | Temp 98.2°F | Resp 24 | Wt 196.2 lb

## 2013-05-26 DIAGNOSIS — Z0000 Encounter for general adult medical examination without abnormal findings: Secondary | ICD-10-CM

## 2013-05-26 DIAGNOSIS — K146 Glossodynia: Secondary | ICD-10-CM

## 2013-05-26 DIAGNOSIS — E119 Type 2 diabetes mellitus without complications: Secondary | ICD-10-CM

## 2013-05-26 DIAGNOSIS — D649 Anemia, unspecified: Secondary | ICD-10-CM

## 2013-05-26 DIAGNOSIS — Z23 Encounter for immunization: Secondary | ICD-10-CM

## 2013-05-26 NOTE — Progress Notes (Signed)
   Subjective:    Patient ID: Adrienne [name], female    DOB: 03-08-1956, 58 y.o.   MRN: [id number]  HPI Pt here for cpe. Overall, she is doing well s/p tibial plateau fracture.  She c/o intermittant tongue discomfort and feels like her saliva is too thick.  She was found to be diabetic on admission in January. She has had DM education and is on metformin. She is on a statin but no lipid panel has been done recently.   # Health maint She sees gyn for mammograms and pap - states she is utd - physician breast exam - by gyn - c-scope/pert FH - due for colonoscopy but declines until leg has healed - dexa - no h/o pathological fracture. Not due by age yet - BP screen - at goal - cholesterol screen - needed - fasting glucose screen -  diabetic - Hep C (born 44-1966) - belives she has had this checked will check her records and let us know. Declines today.  - tdap (once over age 53 in place of DT) - due, will have today     Review of Systems A 12 point review of systems is negative except as per hpi.       Objective:   Physical Exam Nursing note and vitals reviewed. Constitutional: She is oriented to person, place, and time. She appears well-developed and well-nourished.  HENT:  Right Ear: External ear normal.  Left Ear: External ear normal.  Nose: Nose normal.  Mouth/Throat: Oropharynx is clear and moist. No oropharyngeal exudate. tongue and saliva appear wnl. Eyes: Conjunctivae are normal. Pupils are equal, round, and reactive to light.  Neck: Normal range of motion. Neck supple. No thyromegaly present.  Cardiovascular: Normal rate, regular rhythm and normal heart sounds.   Pulmonary/Chest: Effort normal and breath sounds normal.  Abdominal: Soft. Bowel sounds are normal. She exhibits no distension. There is no tenderness. There is no rebound.  Lymphadenopathy:    She has no cervical adenopathy.  Neurological: She is alert and oriented to person, place, and time. She has normal  reflexes. at baseline for her (h/o CHI) Skin: Skin is warm and dry.  Psychiatric: She has a normal mood and affect. Her behavior is normal.  msk - lle in a boot       Assessment & Plan:  Adrienne [name] was seen today for annual exam.  Diagnoses and associated orders for this visit:  Tongue pain - Ambulatory referral to ENT - may be related to Reed maintenance examination - Lipid Panel - tdap - at f/u consider f/u on hep C, mammo, pap to ensure these things happen. Diabetes - TSH - Lipid Panel - consider ace - pt doesn't wanto to lower bp at this time as she is concernde about falling Other Orders - Tdap vaccine greater than or equal to 7yo IM

## 2013-05-26 NOTE — Patient Instructions (Signed)

## 2013-05-27 LAB — LIPID PANEL
CHOLESTEROL: 205 mg/dL — AB (ref 0–200)
HDL: 42 mg/dL (ref >39)
LDL CALC: 89 mg/dL (ref 0–99)
Total CHOL/HDL Ratio: 4.9 Ratio
Triglycerides: 372 mg/dL — ABNORMAL HIGH (ref <150)
VLDL: 74 mg/dL — ABNORMAL HIGH (ref 0–40)

## 2013-05-27 LAB — TSH: TSH: 1.974 u[IU]/mL (ref 0.350–4.500)

## 2013-05-29 ENCOUNTER — Telehealth: Payer: Self-pay | Admitting: *Deleted

## 2013-05-29 NOTE — Telephone Encounter (Signed)
Message copied by [facility], Louis Meckel on Mon May 29, 2013  1:22 PM ------      Message from: Doran Heater      Created: Mon May 29, 2013  1:12 PM       Please let pt know cholesterol is too high. encouage diet and exercise. lipitor that she is now on should help. Suggest rechecking in 6-12 mos. Thanks AW ------

## 2013-05-29 NOTE — Progress Notes (Signed)
See telephone encounter.

## 2013-05-29 NOTE — Telephone Encounter (Signed)
Pt notified of results and message from MD. She was appreciative and understanding.

## 2013-05-31 ENCOUNTER — Telehealth: Payer: Self-pay | Admitting: *Deleted

## 2013-05-31 NOTE — Telephone Encounter (Signed)
Pt called and left VM stating that MD was to call in Rx for Crestor, Lantus and Metformin and CBG strips. Will route to MD

## 2013-06-02 ENCOUNTER — Other Ambulatory Visit (HOSPITAL_COMMUNITY): Payer: Self-pay

## 2013-06-02 DIAGNOSIS — D649 Anemia, unspecified: Secondary | ICD-10-CM

## 2013-06-06 ENCOUNTER — Ambulatory Visit (HOSPITAL_COMMUNITY): Payer: Medicare Other

## 2013-06-07 ENCOUNTER — Ambulatory Visit (HOSPITAL_COMMUNITY): Payer: Medicare Other

## 2013-06-08 ENCOUNTER — Other Ambulatory Visit (HOSPITAL_COMMUNITY): Payer: Medicare Other

## 2013-06-08 ENCOUNTER — Encounter (HOSPITAL_COMMUNITY): Payer: Medicare Other

## 2013-06-08 ENCOUNTER — Encounter (HOSPITAL_COMMUNITY): Payer: Self-pay

## 2013-06-08 ENCOUNTER — Encounter (HOSPITAL_COMMUNITY): Payer: Medicare Other | Attending: Hematology and Oncology

## 2013-06-08 VITALS — BP 123/81 | HR 86 | Temp 98.5°F | Resp 18 | Wt 200.3 lb

## 2013-06-08 DIAGNOSIS — G4730 Sleep apnea, unspecified: Secondary | ICD-10-CM

## 2013-06-08 DIAGNOSIS — J309 Allergic rhinitis, unspecified: Secondary | ICD-10-CM

## 2013-06-08 DIAGNOSIS — IMO0001 Reserved for inherently not codable concepts without codable children: Secondary | ICD-10-CM

## 2013-06-08 DIAGNOSIS — E1165 Type 2 diabetes mellitus with hyperglycemia: Secondary | ICD-10-CM

## 2013-06-08 DIAGNOSIS — D649 Anemia, unspecified: Secondary | ICD-10-CM

## 2013-06-08 DIAGNOSIS — K2270 Barrett's esophagus without dysplasia: Secondary | ICD-10-CM

## 2013-06-08 DIAGNOSIS — J449 Chronic obstructive pulmonary disease, unspecified: Secondary | ICD-10-CM

## 2013-06-08 DIAGNOSIS — S82142A Displaced bicondylar fracture of left tibia, initial encounter for closed fracture: Secondary | ICD-10-CM

## 2013-06-08 DIAGNOSIS — D62 Acute posthemorrhagic anemia: Secondary | ICD-10-CM

## 2013-06-08 LAB — CBC WITH DIFFERENTIAL/PLATELET
BASOS ABS: 0 10*3/uL (ref 0.0–0.1)
BASOS PCT: 1 % (ref 0–1)
EOS ABS: 0.1 10*3/uL (ref 0.0–0.7)
Eosinophils Relative: 2 % (ref 0–5)
HCT: 45 % (ref 36.0–46.0)
Hemoglobin: 14.6 g/dL (ref 12.0–15.0)
Lymphocytes Relative: 32 % (ref 12–46)
Lymphs Abs: 2.8 10*3/uL (ref 0.7–4.0)
MCH: 30.6 pg (ref 26.0–34.0)
MCHC: 32.4 g/dL (ref 30.0–36.0)
MCV: 94.3 fL (ref 78.0–100.0)
Monocytes Absolute: 0.8 10*3/uL (ref 0.1–1.0)
Monocytes Relative: 10 % (ref 3–12)
NEUTROS PCT: 56 % (ref 43–77)
Neutro Abs: 4.8 10*3/uL (ref 1.7–7.7)
PLATELETS: 288 10*3/uL (ref 150–400)
RBC: 4.77 MIL/uL (ref 3.87–5.11)
RDW: 14.3 % (ref 11.5–15.5)
WBC: 8.5 10*3/uL (ref 4.0–10.5)

## 2013-06-08 NOTE — Progress Notes (Signed)
Pt had blood drawn from left ac. Tolerated well.

## 2013-06-08 NOTE — Patient Instructions (Signed)
[facility] Discharge Instructions  RECOMMENDATIONS MADE BY THE CONSULTANT AND ANY TEST RESULTS WILL BE SENT TO YOUR REFERRING PHYSICIAN.  We will not schedule you for any follow ups at this clinic.  Please call us for any significant changes or questions.  Thank you for choosing [facility] to provide your oncology and hematology care.  To afford each patient quality time with our providers, please arrive at least 15 minutes before your scheduled appointment time.  With your help, our goal is to use those 15 minutes to complete the necessary work-up to ensure our physicians have the information they need to help with your evaluation and healthcare recommendations.    Effective January 1st, 2014, we ask that you re-schedule your appointment with our physicians should you arrive 10 or more minutes late for your appointment.  We strive to give you quality time with our providers, and arriving late affects you and other patients whose appointments are after yours.    Again, thank you for choosing [facility].  Our hope is that these requests will decrease the amount of time that you wait before being seen by our physicians.       _____________________________________________________________  Should you have questions after your visit to [facility], please contact our office at [phone] between the hours of 8:30 a.m. and 5:00 p.m.  Voicemails left after 4:30 p.m. will not be returned until the following business day.  For prescription refill requests, have your pharmacy contact our office with your prescription refill request.

## 2013-06-08 NOTE — Progress Notes (Signed)
[facility]  OFFICE PROGRESS NOTE  Doran Heater, MD No address on file  DIAGNOSIS: No diagnosis found.  Chief Complaint  Patient presents with  . Anemia secondary to blood loss    CURRENT THERAPY: Workup in progress for anemia.  INTERVAL HISTORY: Adrienne [name] 58 y.o. female returns for followup of anemia after undergoing open reduction internal fixation for left lower extremity plateau fracture of the tibia with a previous history of smoking in the setting of chronic obstructive pulmonary disease. Plateau fracture is healed well. She is no longer wearing a boot. She was told that she could do anything she wanted. Appetite has been good. She did have 2 teeth pulled yesterday so she is having some more difficulty eating than usual. She denies a diarrhea, constipation, melena, hematochezia, hematuria, epistaxis, or hemoptysis. Urine is normal color with no fever, night sweats, easy satiety, skin rash, headache, or seizures.   MEDICAL HISTORY: Past Medical History  Diagnosis Date  . Allergic rhinitis   . Anemia     leukocytosis/thrombocytosis--Dr. Tressie Stalker  . Asthma   . Depression     ? Bipolar Disorder--Dr, Jimmye Norman  . GERD (gastroesophageal reflux disease)     Barrett's esophagus  . Low back pain   . Seizure disorder     ? absence  . Macular degeneration, bilateral     Dry-Dr. Iona Hansen  . Arthritis   . Hx of migraines   . Brain injury     Closed head  . OSA (obstructive sleep apnea)   . RLS (restless legs syndrome)   . NIDDM (non-insulin dependent diabetes mellitus)   . Status post right hip replacement   . Status post rotator cuff surgery   . Closed head injury 2005    INTERIM HISTORY: has DIABETES MELLITUS, TYPE II, UNCONTROLLED; ANEMIA-NOS; ANXIETY; DEPRESSION; RESTLESS LEG SYNDROME; MACULAR DEGENERATION; ALLERGIC RHINITIS; ASTHMA; GERD; SHOULDER, ARTHRITIS, DEGEN./OSTEO; HIP, ARTHRITIS, DEGEN./OSTEO; ARTHRITIS;  SHOULDER PAIN; H N P-LUMBAR; NECK PAIN; LOW BACK PAIN; SCIATICA; BACK PAIN; IMPINGEMENT SYNDROME; RUPTURE ROTATOR CUFF; SEIZURE DISORDER; SLEEP APNEA; FATIGUE; WEIGHT GAIN, ABNORMAL; Headache(784.0); HEAD TRAUMA, CLOSED; Barrett's esophagus; Left tibial fracture; Fracture of left tibial plateau; Hematoma; and Cellulitis on her problem list.    ALLERGIES:  is allergic to codeine; morphine and related; dilaudid; penicillins; and sulfonamide derivatives.  MEDICATIONS: has a current medication list which includes the following prescription(s): abilify, alprazolam, aspirin ec, atorvastatin, biotin, vitamin d3, cinnamon, cyanocobalamin, cyclobenzaprine, desvenlafaxine, flaxseed (linseed), furosemide, hydrocodone-acetaminophen, melatonin, metformin, methylphenidate, multivitamin, omega-3 acid ethyl esters, omeprazole, oxycodone-acetaminophen, polyethylene glycol, senna-docusate, and vitamin e.  SURGICAL HISTORY:  Past Surgical History  Procedure Laterality Date  . Colonoscopy  12/08    TCS/EGD: + Barrett's,+ gastritis, friable anal canal, hyperplastic polyp  . Appendectomy    . Vesicovaginal fistula closure w/ tah    . Rotator cuff repair      Bilaterally  . Revision total hip arthroplasty      Right  . Tonsillectomy    . Fracture surgery Left 03/07/2013    knee  . Orif tibia plateau Left 03/07/2013    Procedure: OPEN REDUCTION INTERNAL FIXATION (ORIF) TIBIAL PLATEAU;  Surgeon: Renette Butters, MD;  Location: [facility];  Service: Orthopedics;  Laterality: Left;  . Shoulder injection Left 03/07/2013    Procedure: SHOULDER INJECTION ; Depomedrol + 0.25% Marcaine mixture 4:1;  Surgeon: Renette Butters, MD;  Location: [facility];  Service: Orthopedics;  Laterality: Left;  FAMILY HISTORY: family history includes Atrial fibrillation in her mother; Cancer in her maternal grandfather and mother; Diabetes in her maternal grandfather; Thyroid disease in her son.  SOCIAL HISTORY:  reports that she quit smoking  about 6 months ago. Her smoking use included Cigarettes. She started smoking about 42 years ago. She has a 40 pack-year smoking history. She has never used smokeless tobacco. She reports that she does not drink alcohol or use illicit drugs.  REVIEW OF SYSTEMS:  Other than that discussed above is noncontributory.  PHYSICAL EXAMINATION: ECOG PERFORMANCE STATUS: 0 - Asymptomatic  There were no vitals taken for this visit.  GENERAL:alert, no distress and comfortable SKIN: skin color, texture, turgor are normal, no rashes or significant lesions EYES: PERLA; Conjunctiva are pink and non-injected, sclera clear SINUSES: No redness or tenderness over maxillary or ethmoid sinuses OROPHARYNX:no exudate, no erythema on lips, buccal mucosa, or tongue. NECK: supple, thyroid normal size, non-tender, without nodularity. No masses CHEST: Increased AP diameter with no breast masses. LYMPH:  no palpable lymphadenopathy in the cervical, axillary or inguinal LUNGS: clear to auscultation and percussion with normal breathing effort HEART: regular rate & rhythm and no murmurs. ABDOMEN:abdomen soft, non-tender and normal bowel sounds MUSCULOSKELETAL:no cyanosis of digits and no clubbing. Range of motion normal. Left lower 70 surgical wound is well-healed. NEURO: alert & oriented x 3 with fluent speech, no focal motor/sensory deficits   LABORATORY DATA: Results for Adrienne [name], Adrienne [name] (MRN [id number]) as of 06/08/2013 14:12  Ref. Range 03/05/2013 08:30 03/06/2013 14:50 03/07/2013 16:00 03/09/2013 11:55 05/23/2013 14:40  Hemoglobin Latest Range: 12.0-15.0 g/dL 12.6 13.2 12.8 11.9 (L) 14.9     Office Visit on 05/26/2013  Component Date Value Ref Range Status  . TSH 05/26/2013 1.974  0.350 - 4.500 uIU/mL Final  . Cholesterol 05/26/2013 205* 0 - 200 mg/dL Final   Comment: ATP III Classification:                                < 200        mg/dL        Desirable                               200 - 239     mg/dL         Borderline High                               >= 240        mg/dL        High                             . Triglycerides 05/26/2013 372* <150 mg/dL Final  . HDL 05/26/2013 42  >39 mg/dL Final  . Total CHOL/HDL Ratio 05/26/2013 4.9   Final  . VLDL 05/26/2013 74* 0 - 40 mg/dL Final  . LDL Cholesterol 05/26/2013 89  0 - 99 mg/dL Final   Comment:                            Total Cholesterol/HDL Ratio:CHD Risk  Coronary Heart Disease Risk Table                                                                 Men       Women                                   1/2 Average Risk              3.4        3.3                                       Average Risk              5.0        4.4                                    2X Average Risk              9.6        7.1                                    3X Average Risk             23.4       11.0                          Use the calculated Patient Ratio above and the CHD Risk table                           to determine the patient's CHD Risk.                          ATP III Classification (LDL):                                < 100        mg/dL         Optimal                               100 - 129     mg/dL         Near or Above Optimal                               130 - 159     mg/dL         Borderline High                               160 - 189     mg/dL           High                                > 190        mg/dL         Very High                             Office Visit on 05/23/2013  Component Date Value Ref Range Status  . WBC 05/23/2013 9.3  4.0 - 10.5 K/uL Final  . RBC 05/23/2013 4.80  3.87 - 5.11 MIL/uL Final  . Hemoglobin 05/23/2013 14.9  12.0 - 15.0 g/dL Final  . HCT 05/23/2013 45.2  36.0 - 46.0 % Final  . MCV 05/23/2013 94.2  78.0 - 100.0 fL Final  . MCH 05/23/2013 31.0  26.0 - 34.0 pg Final  . MCHC 05/23/2013 33.0  30.0 - 36.0 g/dL Final  . RDW 05/23/2013 14.3  11.5 - 15.5 % Final    . Platelets 05/23/2013 294  150 - 400 K/uL Final  . Neutrophils Relative % 05/23/2013 55  43 - 77 % Final  . Neutro Abs 05/23/2013 5.1  1.7 - 7.7 K/uL Final  . Lymphocytes Relative 05/23/2013 35  12 - 46 % Final  . Lymphs Abs 05/23/2013 3.3  0.7 - 4.0 K/uL Final  . Monocytes Relative 05/23/2013 8  3 - 12 % Final  . Monocytes Absolute 05/23/2013 0.8  0.1 - 1.0 K/uL Final  . Eosinophils Relative 05/23/2013 1  0 - 5 % Final  . Eosinophils Absolute 05/23/2013 0.1  0.0 - 0.7 K/uL Final  . Basophils Relative 05/23/2013 1  0 - 1 % Final  . Basophils Absolute 05/23/2013 0.1  0.0 - 0.1 K/uL Final  . Retic Ct Pct 05/23/2013 2.0  0.4 - 3.1 % Final  . RBC. 05/23/2013 4.80  3.87 - 5.11 MIL/uL Final  . Retic Count, Manual 05/23/2013 96.0  19.0 - 186.0 K/uL Final  . Sodium 05/23/2013 138  137 - 147 mEq/L Final  . Potassium 05/23/2013 4.5  3.7 - 5.3 mEq/L Final  . Chloride 05/23/2013 101  96 - 112 mEq/L Final  . CO2 05/23/2013 24  19 - 32 mEq/L Final  . Glucose, Bld 05/23/2013 155* 70 - 99 mg/dL Final  . BUN 05/23/2013 18  6 - 23 mg/dL Final  . Creatinine, Ser 05/23/2013 0.54  0.50 - 1.10 mg/dL Final  . Calcium 05/23/2013 9.6  8.4 - 10.5 mg/dL Final  . GFR calc non Af Amer 05/23/2013 >90  >90 mL/min Final  . GFR calc Af Amer 05/23/2013 >90  >90 mL/min Final   Comment: (NOTE)                          The eGFR has been calculated using the CKD EPI equation.                          This calculation has not been validated in all clinical situations.                          eGFR's persistently <90 mL/min signify possible Chronic Kidney                          Disease.  . Vitamin  B-12 05/23/2013 735  211 - 911 pg/mL Final   Performed at [facility]  . Folate 05/23/2013 >20.0   Final   Comment: (NOTE)                          Reference Ranges                                 Deficient:       0.4 - 3.3 ng/mL                                 Indeterminate:   3.4 - 5.4 ng/mL                                  Normal:              > 5.4 ng/mL                          Performed at [facility]  . Iron 05/23/2013 68  42 - 135 ug/dL Final  . TIBC 05/23/2013 312  250 - 470 ug/dL Final  . Saturation Ratios 05/23/2013 22  20 - 55 % Final  . UIBC 05/23/2013 244  125 - 400 ug/dL Final   Performed at [facility]  . Ferritin 05/23/2013 317* 10 - 291 ng/mL Final   Performed at [facility]  . Haptoglobin 05/23/2013 218* 45 - 215 mg/dL Final   Performed at [facility]  . DAT, complement 05/23/2013 NEG   Final  . DAT, IgG 05/23/2013    Final                   Value:NEG                         Performed at [facility]  . ANA 05/23/2013 NEGATIVE  NEGATIVE Final   Performed at [facility]: Peripheral smear is normal.  Urinalysis    Component Value Date/Time   COLORURINE YELLOW 12/16/2007 [place] 12/16/2007 1404   LABSPEC 1.026 12/16/2007 1404   PHURINE 5.5 12/16/2007 1404   GLUCOSEU >1000* 12/16/2007 1404   HGBUR NEGATIVE 12/16/2007 [place] 12/16/2007 1404   KETONESUR NEGATIVE 12/16/2007 1404   PROTEINUR NEGATIVE 12/16/2007 1404   UROBILINOGEN 0.2 12/16/2007 1404   NITRITE NEGATIVE 12/16/2007 1404   LEUKOCYTESUR NEGATIVE 12/16/2007 1404    RADIOGRAPHIC STUDIES: No results found.  ASSESSMENT:  #1. Mild anemia secondary to acute blood loss from orthopedic surgery.  #2. Status post left lower extremity plateau fracture, status post ORIF on 03/07/2013.  #3. Chronic obstructive pulmonary disease, stop smoking 3 months ago.  #4. Bipolar disorder, on treatment.  #5. History in the past of closed head trauma, stable.  #6. Seizure disorder, controlled.  #7. Diabetes mellitus, type II, non-insulin requiring, controlled.  #8. Gastroesophageal reflux disease with Barrett's esophagus, currently taking omeprazole. Chronic use of proton pump inhibitor may result in iron malabsorption contributing to  anemia    PLAN:  #1. The patient was reassured. #2. No further workup or clinic visit was scheduled.   All questions were  answered. The patient knows to call the clinic with any problems, questions or concerns. We can certainly see the patient much sooner if necessary.   I spent 25 minutes counseling the patient face to face. The total time spent in the appointment was 30 minutes.    Doroteo Bradford, MD 06/08/2013 2:08 PM

## 2013-06-09 NOTE — Progress Notes (Signed)
Lab draw documentation on MD visit encounter.

## 2013-06-27 ENCOUNTER — Ambulatory Visit: Payer: Medicare Other | Admitting: Family Medicine

## 2013-07-06 ENCOUNTER — Ambulatory Visit (INDEPENDENT_AMBULATORY_CARE_PROVIDER_SITE_OTHER): Payer: Medicare HMO | Admitting: Otolaryngology

## 2014-05-15 ENCOUNTER — Other Ambulatory Visit (HOSPITAL_COMMUNITY): Payer: Self-pay | Admitting: Oncology

## 2014-05-15 DIAGNOSIS — F17219 Nicotine dependence, cigarettes, with unspecified nicotine-induced disorders: Secondary | ICD-10-CM

## 2014-05-15 DIAGNOSIS — S069X9A Unspecified intracranial injury with loss of consciousness of unspecified duration, initial encounter: Secondary | ICD-10-CM | POA: Insufficient documentation

## 2014-05-15 DIAGNOSIS — F319 Bipolar disorder, unspecified: Secondary | ICD-10-CM | POA: Insufficient documentation

## 2014-05-15 DIAGNOSIS — Z87891 Personal history of nicotine dependence: Secondary | ICD-10-CM | POA: Insufficient documentation

## 2014-05-24 ENCOUNTER — Ambulatory Visit (HOSPITAL_COMMUNITY)
Admission: RE | Admit: 2014-05-24 | Discharge: 2014-05-24 | Disposition: A | Discharge status: Home / Self Care | Payer: Medicare HMO | Source: Ambulatory Visit | Attending: Diagnostic Radiology | Admitting: Diagnostic Radiology

## 2014-05-24 DIAGNOSIS — J439 Emphysema, unspecified: Secondary | ICD-10-CM | POA: Diagnosis not present

## 2014-05-24 DIAGNOSIS — F17219 Nicotine dependence, cigarettes, with unspecified nicotine-induced disorders: Secondary | ICD-10-CM | POA: Insufficient documentation

## 2014-05-24 DIAGNOSIS — Z122 Encounter for screening for malignant neoplasm of respiratory organs: Secondary | ICD-10-CM | POA: Diagnosis present

## 2014-12-11 ENCOUNTER — Encounter: Payer: Self-pay | Admitting: Internal Medicine

## 2014-12-11 ENCOUNTER — Other Ambulatory Visit: Payer: Self-pay

## 2014-12-11 ENCOUNTER — Ambulatory Visit (INDEPENDENT_AMBULATORY_CARE_PROVIDER_SITE_OTHER): Payer: Medicare HMO | Admitting: Internal Medicine

## 2014-12-11 VITALS — BP 161/82 | HR 79 | Temp 97.8°F | Ht 65.0 in | Wt 183.6 lb

## 2014-12-11 DIAGNOSIS — K921 Melena: Secondary | ICD-10-CM

## 2014-12-11 DIAGNOSIS — K2270 Barrett's esophagus without dysplasia: Secondary | ICD-10-CM

## 2014-12-11 DIAGNOSIS — K219 Gastro-esophageal reflux disease without esophagitis: Secondary | ICD-10-CM

## 2014-12-11 MED ORDER — PEG 3350-KCL-NA BICARB-NACL 420 G PO SOLR: 4000.0000 mL | ORAL | Status: DC

## 2014-12-11 NOTE — Progress Notes (Signed)
Primary Care Physician:  No PCP Per Patient Primary Gastroenterologist:  Dr. Gala Romney  Pre-Procedure History & Physical: HPI:  Adrienne [name] is a 59 y.o. female here for for follow-up GERD and known Barrett's esophagus.  Last seen in 2012. EGD back in 2008 demonstrated Barrett's esophagus. She underwent a colonoscopy that time for screening purposes which revealed a friable anal canal and a hyperplastic polyp which was removed.  She was seen in 2012 to set up a surveillance EGD. She did not follow through. She states she had insurance issues; now insured and wants to update her EGD  On omeprazole 20 mg daily - continues to control her reflux symptoms very well. She denies any dysphagia or melena. No early satiety, nausea or vomiting. She has recently experienced some hematochezia spontaneously without straining.She typically has a bowel movement every day.  Past Medical History  Diagnosis Date  . Allergic rhinitis   . Anemia     leukocytosis/thrombocytosis--Dr. Tressie Stalker  . Asthma   . Depression     ? Bipolar Disorder--Dr, Jimmye Norman  . GERD (gastroesophageal reflux disease)     Barrett's esophagus  . Low back pain   . Seizure disorder ([place])     ? absence  . Macular degeneration, bilateral     Dry-Dr. Iona Hansen  . Arthritis   . Hx of migraines   . Brain injury ([place])     Closed head  . OSA (obstructive sleep apnea)   . RLS (restless legs syndrome)   . NIDDM (non-insulin dependent diabetes mellitus)   . Status post right hip replacement   . Status post rotator cuff surgery   . Closed head injury 2005    Past Surgical History  Procedure Laterality Date  . Colonoscopy with esophagogastroduodenoscopy (egd)  12/08    TCS/EGD: + Barrett's,+ gastritis, friable anal canal, hyperplastic polyp  . Appendectomy    . Vesicovaginal fistula closure w/ tah    . Rotator cuff repair      Bilaterally  . Revision total hip arthroplasty      Right  . Tonsillectomy    . Fracture surgery Left  03/07/2013    knee  . Orif tibia plateau Left 03/07/2013    Procedure: OPEN REDUCTION INTERNAL FIXATION (ORIF) TIBIAL PLATEAU;  Surgeon: Renette Butters, MD;  Location: [facility];  Service: Orthopedics;  Laterality: Left;  . Shoulder injection Left 03/07/2013    Procedure: SHOULDER INJECTION ; Depomedrol + 0.25% Marcaine mixture 4:1;  Surgeon: Renette Butters, MD;  Location: [facility];  Service: Orthopedics;  Laterality: Left;  . Esophagogastroduodenoscopy  11/16/2002    Dr.Patty Lopezgarcia- salmon-colored epithelium in the upper esophageal sphincter previous bx= ectopic gastric mucosa, salmon colored epithelium at the distal esophagus proviously bx= barretts esophagus o/w the esophageal mucosa appeared normal. patulous esophagogastric junction, small hiatal hernia, gastric mucosa o/w appeared normal. normal D1 and D2    Prior to Admission medications   Medication Sig Start Date End Date Taking? Authorizing Provider  ALPRAZolam Duanne Moron) 1 MG tablet Take 1 tablet (1 mg total) by mouth at bedtime as needed. 03/13/13  Yes Tiffany L Reed, DO  aspirin 81 MG tablet Take 81 mg by mouth daily.   Yes Historical Provider, MD  BIOTIN PO Take 1 capsule by mouth daily.   Yes Historical Provider, MD  Cholecalciferol (VITAMIN D3) 400 UNITS CAPS Take by mouth.     Yes Historical Provider, MD  Cinnamon 500 MG TABS Take by mouth.     Yes Historical  Provider, MD  Cyanocobalamin (VITAMIN B 12 PO) Take by mouth.     Yes Historical Provider, MD  cyclobenzaprine (FLEXERIL) 10 MG tablet Take 10 mg by mouth at bedtime.   Yes Historical Provider, MD  desvenlafaxine (PRISTIQ) 100 MG 24 hr tablet Take 100 mg by mouth daily.     Yes Historical Provider, MD  Flaxseed, Linseed, (FLAX SEED OIL PO) Take by mouth.     Yes Historical Provider, MD  Melatonin 10 MG CAPS Take 10 mg by mouth at bedtime.   Yes Historical Provider, MD  metFORMIN (GLUCOPHAGE) 500 MG tablet Take 1 tablet (500 mg total) by mouth 2 (two) times daily with a meal. 04/14/13  Yes  Doran Heater, MD  methylphenidate (RITALIN) 10 MG tablet Take 1 tablet (10 mg total) by mouth 2 (two) times daily. 03/13/13  Yes Tiffany L Reed, DO  Multiple Vitamin (MULTIVITAMIN) capsule Take 1 capsule by mouth daily.     Yes Historical Provider, MD  omega-3 acid ethyl esters (LOVAZA) 1 G capsule  03/20/13  Yes Historical Provider, MD  omeprazole (PRILOSEC) 20 MG capsule Take 1 capsule (20 mg total) by mouth daily. 04/14/13  Yes Doran Heater, MD  prazosin (MINIPRESS) 1 MG capsule Take 1 mg by mouth at bedtime.   Yes Historical Provider, MD  vitamin E 1000 UNIT capsule Take 1,000 Units by mouth daily.     Yes Historical Provider, MD  ABILIFY 10 MG tablet  05/11/13   Historical Provider, MD  atorvastatin (LIPITOR) 10 MG tablet Take 2 tablets (20 mg total) by mouth daily. Patient not taking: Reported on 12/11/2014 04/14/13   Doran Heater, MD  clindamycin (CLEOCIN) 300 MG capsule Take 300 mg by mouth 4 (four) times daily.    Historical Provider, MD  furosemide (LASIX) 20 MG tablet Take 20 mg by mouth daily.    Historical Provider, MD  HYDROcodone-acetaminophen (NORCO/VICODIN) 5-325 MG per tablet Take 1 tablet by mouth every 4 (four) hours as needed for moderate pain.    Historical Provider, MD  oxyCODONE-acetaminophen (PERCOCET/ROXICET) 5-325 MG per tablet Take 1 tablet by mouth as needed. 03/22/13   Historical Provider, MD  polyethylene glycol (MIRALAX / GLYCOLAX) packet Take 17 g by mouth daily as needed. Patient not taking: Reported on 12/11/2014 03/10/13   Oswald Hillock, MD  polyethylene glycol-electrolytes (TRILYTE) 420 G solution Take 4,000 mLs by mouth as directed. 12/11/14   Daneil Dolin, MD  senna-docusate (SENOKOT-S) 8.6-50 MG per tablet Take 1 tablet by mouth 2 (two) times daily. Patient not taking: Reported on 12/11/2014 03/10/13   Oswald Hillock, MD    Allergies as of 12/11/2014 - Review Complete 12/11/2014  Allergen Reaction Noted  . Codeine    . Morphine and related Itching 03/02/2013  .  Dilaudid [hydromorphone hcl] Itching and Rash 05/23/2013  . Penicillins Rash   . Sulfonamide derivatives Rash     Family History  Problem Relation Age of Onset  . Atrial fibrillation Mother   . Diabetes Maternal Grandfather   . Cancer Mother   . Cancer Maternal Grandfather   . Thyroid disease Son     Social History   Social History  . Marital Status: Divorced    Spouse Name: N/A  . Number of Children: 3  . Years of Education: 14   Occupational History  . on disability     due to head injury, 2005   Social History Main Topics  . Smoking status: Former Smoker --  1.00 packs/day for 40 years    Types: Cigarettes    Start date: 03/03/1971    Quit date: 12/01/2012  . Smokeless tobacco: Never Used  . Alcohol Use: No  . Drug Use: No  . Sexual Activity: Yes   Other Topics Concern  . Not on file   Social History Narrative    Review of Systems: See HPI, otherwise negative ROS  Physical Exam: BP 161/82 mmHg  Pulse 79  Temp(Src) 97.8 F (36.6 C) (Oral)  Ht 5\' 5"  (1.651 m)  Wt 183 lb 9.6 oz (83.28 kg)  BMI 30.55 kg/m2 General:   Alert,  Well-developed, well-nourished, pleasant and cooperative in NAD. Accompanied by her sister. Skin:  Intact without significant lesions or rashes. Neck:  Supple; no masses or thyromegaly. No significant cervical adenopathy. Lungs:  Clear throughout to auscultation.   No wheezes, crackles, or rhonchi. No acute distress. Heart:  Regular rate and rhythm; no murmurs, clicks, rubs,  or gallops. Abdomen: Non-distended, normal bowel sounds.  Soft and nontender without appreciable mass or hepatosplenomegaly.  Pulses:  Normal pulses noted. Extremities:  Without clubbing or edema.  Impression:  Pleasant 59 year old lady with multiple medical problems on multiple medications with known Barrett's esophagus presents for consideration of surveillance EGD. Barrett's esophagus which goes back to at least 2008. She's not having any alarm symptoms. She is  overdue for surveillance examination. Recent painless hematochezia also an issue. May well be from benign anorectal source, however, it's been in good a year since she had a colonoscopy.  Recommendations: Surveillance colonoscopy with biopsy in the near future. I have also offered her diagnostic colonoscopy the same time.The risks, benefits, limitations, imponderables and alternatives regarding both EGD and colonoscopy have been reviewed with the patient. Questions have been answered. All parties agreeable.   Because of multiple co-morbidities and polypharmacy, will enlist the help of anesthesia for deep sedation for these procedures.  Further recommendations to follow.     Notice: This dictation was prepared with Dragon dictation along with smaller phrase technology. Any transcriptional errors that result from this process are unintentional and may not be corrected upon review.

## 2014-12-11 NOTE — Patient Instructions (Signed)
Schedule Surveillance EGD (history of Barrett's) and diagnostic TCS (hematochezia) - PROPOFOL needed  Split prep  We will get records regarding labs done at Dr. Karen Kitchens recently

## 2014-12-13 ENCOUNTER — Encounter (INDEPENDENT_AMBULATORY_CARE_PROVIDER_SITE_OTHER): Payer: Medicare HMO | Admitting: Ophthalmology

## 2014-12-13 DIAGNOSIS — H353114 Nonexudative age-related macular degeneration, right eye, advanced atrophic with subfoveal involvement: Secondary | ICD-10-CM

## 2014-12-13 DIAGNOSIS — H43813 Vitreous degeneration, bilateral: Secondary | ICD-10-CM | POA: Diagnosis not present

## 2014-12-13 DIAGNOSIS — H353221 Exudative age-related macular degeneration, left eye, with active choroidal neovascularization: Secondary | ICD-10-CM

## 2014-12-13 DIAGNOSIS — D3132 Benign neoplasm of left choroid: Secondary | ICD-10-CM | POA: Diagnosis not present

## 2014-12-20 NOTE — Patient Instructions (Signed)
LUWANA BUTRICK  12/20/2014     @PREFPERIOPPHARMACY @   Your procedure is scheduled on  12/27/2014   Report to [facility] at  1045  A.M.  Call this number if you have problems the morning of surgery:  [phone]   Remember:  Do not eat food or drink liquids after midnight.  Take these medicines the morning of surgery with A SIP OF WATER  Abilify, xanax, flexaril, pristiq, hydrocodone (or oxycodone), ritalin, prilosec. DO NOT take any of your diabetic medicines the morning of your surgery. Stop all over the counter herbal or holistic medications.   Do not wear jewelry, make-up or nail polish.  Do not wear lotions, powders, or perfumes.  You may wear deodorant.  Do not shave 48 hours prior to surgery.  Men may shave face and neck.  Do not bring valuables to the hospital.  [facility] is not responsible for any belongings or valuables.  Contacts, dentures or bridgework may not be worn into surgery.  Leave your suitcase in the car.  After surgery it may be brought to your room.  For patients admitted to the hospital, discharge time will be determined by your treatment team.  Patients discharged the day of surgery will not be allowed to drive home.   Name and phone number of your driver:   family Special instructions:  Follow the diet and prep instructions given to you by Dr Roseanne Kaufman office.  Please read over the following fact sheets that you were given. Pain Booklet, Coughing and Deep Breathing, Surgical Site Infection Prevention, Anesthesia Post-op Instructions and Care and Recovery After Surgery      Esophagogastroduodenoscopy Esophagogastroduodenoscopy (EGD) is a procedure that is used to examine the lining of the esophagus, stomach, and first part of the small intestine (duodenum). A long, flexible, lighted tube with a camera attached (endoscope) is inserted down the throat to view these organs. This procedure is done to detect problems or abnormalities, such as  inflammation, bleeding, ulcers, or growths, in order to treat them. The procedure lasts 5-20 minutes. It is usually an outpatient procedure, but it may need to be performed in a hospital in emergency cases. LET [facility] CARE PROVIDER KNOW ABOUT:  Any allergies you have.  All medicines you are taking, including vitamins, herbs, eye drops, creams, and over-the-counter medicines.  Previous problems you or members of your family have had with the use of anesthetics.  Any blood disorders you have.  Previous surgeries you have had.  Medical conditions you have. RISKS AND COMPLICATIONS Generally, this is a safe procedure. However, problems can occur and include:  Infection.  Bleeding.  Tearing (perforation) of the esophagus, stomach, or duodenum.  Difficulty breathing or not being able to breathe.  Excessive sweating.  Spasms of the larynx.  Slowed heartbeat.  Low blood pressure. BEFORE THE PROCEDURE  Do not eat or drink anything after midnight on the night before the procedure or as directed by your health care provider.  Do not take your regular medicines before the procedure if your health care provider asks you not to. Ask your health care provider about changing or stopping those medicines.  If you wear dentures, be prepared to remove them before the procedure.  Arrange for someone to drive you home after the procedure. PROCEDURE  A numbing medicine (local anesthetic) may be sprayed in your throat for comfort and to stop you from gagging or coughing.  You will have an  IV tube inserted in a vein in your hand or arm. You will receive medicines and fluids through this tube.  You will be given a medicine to relax you (sedative).  A pain reliever will be given through the IV tube.  A mouth guard may be placed in your mouth to protect your teeth and to keep you from biting on the endoscope.  You will be asked to lie on your left side.  The endoscope will be inserted  down your throat and into your esophagus, stomach, and duodenum.  Air will be put through the endoscope to allow your health care provider to clearly view the lining of your esophagus.  The lining of your esophagus, stomach, and duodenum will be examined. During the exam, your health care provider may:  Remove tissue to be examined under a microscope (biopsy) for inflammation, infection, or other medical problems.  Remove growths.  Remove objects (foreign bodies) that are stuck.  Treat any bleeding with medicines or other devices that stop tissues from bleeding (hot cautery, clipping devices).  Widen (dilate) or stretch narrowed areas of your esophagus and stomach.  The endoscope will be withdrawn. AFTER THE PROCEDURE  You will be taken to a recovery area for observation. Your blood pressure, heart rate, breathing rate, and blood oxygen level will be monitored often until the medicines you were given have worn off.  Do not eat or drink anything until the numbing medicine has worn off and your gag reflex has returned. You may choke.  Your health care provider should be able to discuss his or her findings with you. It will take longer to discuss the test results if any biopsies were taken.   This information is not intended to replace advice given to you by your health care provider. Make sure you discuss any questions you have with your health care provider.   Document Released: 06/26/2004 Document Revised: 03/16/2014 Document Reviewed: 01/27/2012 Elsevier Interactive Patient Education 2016 [employer]. Colonoscopy A colonoscopy is an exam to look at the entire large intestine (colon). This exam can help find problems such as tumors, polyps, inflammation, and areas of bleeding. The exam takes about 1 hour.  LET [facility] CARE PROVIDER KNOW ABOUT:   Any allergies you have.  All medicines you are taking, including vitamins, herbs, eye drops, creams, and over-the-counter  medicines.  Previous problems you or members of your family have had with the use of anesthetics.  Any blood disorders you have.  Previous surgeries you have had.  Medical conditions you have. RISKS AND COMPLICATIONS  Generally, this is a safe procedure. However, as with any procedure, complications can occur. Possible complications include:  Bleeding.  Tearing or rupture of the colon wall.  Reaction to medicines given during the exam.  Infection (rare). BEFORE THE PROCEDURE   Ask your health care provider about changing or stopping your regular medicines.  You may be prescribed an oral bowel prep. This involves drinking a large amount of medicated liquid, starting the day before your procedure. The liquid will cause you to have multiple loose stools until your stool is almost clear or light green. This cleans out your colon in preparation for the procedure.  Do not eat or drink anything else once you have started the bowel prep, unless your health care provider tells you it is safe to do so.  Arrange for someone to drive you home after the procedure. PROCEDURE   You will be given medicine to help you relax (  sedative).  You will lie on your side with your knees bent.  A long, flexible tube with a light and camera on the end (colonoscope) will be inserted through the rectum and into the colon. The camera sends video back to a computer screen as it moves through the colon. The colonoscope also releases carbon dioxide gas to inflate the colon. This helps your health care provider see the area better.  During the exam, your health care provider may take a small tissue sample (biopsy) to be examined under a microscope if any abnormalities are found.  The exam is finished when the entire colon has been viewed. AFTER THE PROCEDURE   Do not drive for 24 hours after the exam.  You may have a small amount of blood in your stool.  You may pass moderate amounts of gas and have mild  abdominal cramping or bloating. This is caused by the gas used to inflate your colon during the exam.  Ask when your test results will be ready and how you will get your results. Make sure you get your test results.   This information is not intended to replace advice given to you by your health care provider. Make sure you discuss any questions you have with your health care provider.   Document Released: 02/21/2000 Document Revised: 12/14/2012 Document Reviewed: 10/31/2012 Elsevier Interactive Patient Education 2016 [employer]vier [employer]. PATIENT INSTRUCTIONS POST-ANESTHESIA  IMMEDIATELY FOLLOWING SURGERY:  Do not drive or operate machinery for the first twenty four hours after surgery.  Do not make any important decisions for twenty four hours after surgery or while taking narcotic pain medications or sedatives.  If you develop intractable nausea and vomiting or a severe headache please notify your doctor immediately.  FOLLOW-UP:  Please make an appointment with your surgeon as instructed. You do not need to follow up with anesthesia unless specifically instructed to do so.  WOUND CARE INSTRUCTIONS (if applicable):  Keep a dry clean dressing on the anesthesia/puncture wound site if there is drainage.  Once the wound has quit draining you may leave it open to air.  Generally you should leave the bandage intact for twenty four hours unless there is drainage.  If the epidural site drains for more than 36-48 hours please call the anesthesia department.  QUESTIONS?:  Please feel free to call your physician or the hospital operator if you have any questions, and they will be happy to assist you.

## 2014-12-24 ENCOUNTER — Other Ambulatory Visit: Payer: Self-pay

## 2014-12-24 ENCOUNTER — Encounter (HOSPITAL_COMMUNITY): Payer: Self-pay

## 2014-12-24 ENCOUNTER — Encounter (HOSPITAL_COMMUNITY)
Admission: RE | Admit: 2014-12-24 | Discharge: 2014-12-24 | Disposition: A | Discharge status: Home / Self Care | Payer: Medicare HMO | Source: Ambulatory Visit | Attending: Internal Medicine | Admitting: Internal Medicine

## 2014-12-24 DIAGNOSIS — G40909 Epilepsy, unspecified, not intractable, without status epilepticus: Secondary | ICD-10-CM | POA: Diagnosis not present

## 2014-12-24 DIAGNOSIS — Z882 Allergy status to sulfonamides status: Secondary | ICD-10-CM | POA: Diagnosis not present

## 2014-12-24 DIAGNOSIS — G4733 Obstructive sleep apnea (adult) (pediatric): Secondary | ICD-10-CM | POA: Diagnosis not present

## 2014-12-24 DIAGNOSIS — E119 Type 2 diabetes mellitus without complications: Secondary | ICD-10-CM | POA: Diagnosis not present

## 2014-12-24 DIAGNOSIS — Z87891 Personal history of nicotine dependence: Secondary | ICD-10-CM | POA: Diagnosis not present

## 2014-12-24 DIAGNOSIS — K219 Gastro-esophageal reflux disease without esophagitis: Secondary | ICD-10-CM | POA: Diagnosis not present

## 2014-12-24 DIAGNOSIS — Z885 Allergy status to narcotic agent status: Secondary | ICD-10-CM | POA: Diagnosis not present

## 2014-12-24 DIAGNOSIS — K2950 Unspecified chronic gastritis without bleeding: Secondary | ICD-10-CM | POA: Diagnosis not present

## 2014-12-24 DIAGNOSIS — K921 Melena: Secondary | ICD-10-CM | POA: Diagnosis not present

## 2014-12-24 DIAGNOSIS — F329 Major depressive disorder, single episode, unspecified: Secondary | ICD-10-CM | POA: Diagnosis not present

## 2014-12-24 DIAGNOSIS — J45909 Unspecified asthma, uncomplicated: Secondary | ICD-10-CM | POA: Diagnosis not present

## 2014-12-24 DIAGNOSIS — Z7982 Long term (current) use of aspirin: Secondary | ICD-10-CM | POA: Diagnosis not present

## 2014-12-24 DIAGNOSIS — K648 Other hemorrhoids: Secondary | ICD-10-CM | POA: Diagnosis not present

## 2014-12-24 DIAGNOSIS — Z880 Allergy status to penicillin: Secondary | ICD-10-CM | POA: Diagnosis not present

## 2014-12-24 DIAGNOSIS — H3530 Unspecified macular degeneration: Secondary | ICD-10-CM | POA: Diagnosis not present

## 2014-12-24 DIAGNOSIS — Z96641 Presence of right artificial hip joint: Secondary | ICD-10-CM | POA: Diagnosis not present

## 2014-12-24 DIAGNOSIS — G2581 Restless legs syndrome: Secondary | ICD-10-CM | POA: Diagnosis not present

## 2014-12-24 DIAGNOSIS — Z7984 Long term (current) use of oral hypoglycemic drugs: Secondary | ICD-10-CM | POA: Diagnosis not present

## 2014-12-24 DIAGNOSIS — K2270 Barrett's esophagus without dysplasia: Secondary | ICD-10-CM | POA: Diagnosis not present

## 2014-12-24 DIAGNOSIS — K449 Diaphragmatic hernia without obstruction or gangrene: Secondary | ICD-10-CM | POA: Diagnosis not present

## 2014-12-24 DIAGNOSIS — Z86010 Personal history of colonic polyps: Secondary | ICD-10-CM | POA: Diagnosis not present

## 2014-12-24 LAB — BASIC METABOLIC PANEL
ANION GAP: 7 (ref 5–15)
BUN: 14 mg/dL (ref 6–20)
CHLORIDE: 105 mmol/L (ref 101–111)
CO2: 25 mmol/L (ref 22–32)
Calcium: 9.4 mg/dL (ref 8.9–10.3)
Creatinine, Ser: 0.64 mg/dL (ref 0.44–1.00)
GFR calc Af Amer: >60 mL/min (ref >60)
GLUCOSE: 128 mg/dL — AB (ref 65–99)
POTASSIUM: 4.1 mmol/L (ref 3.5–5.1)
Sodium: 137 mmol/L (ref 135–145)

## 2014-12-24 LAB — CBC
HEMATOCRIT: 47.9 % — AB (ref 36.0–46.0)
HEMOGLOBIN: 16.1 g/dL — AB (ref 12.0–15.0)
MCH: 31.9 pg (ref 26.0–34.0)
MCHC: 33.6 g/dL (ref 30.0–36.0)
MCV: 95 fL (ref 78.0–100.0)
PLATELETS: 272 10*3/uL (ref 150–400)
RBC: 5.04 MIL/uL (ref 3.87–5.11)
RDW: 13.6 % (ref 11.5–15.5)
WBC: 10.6 10*3/uL — AB (ref 4.0–10.5)

## 2014-12-27 ENCOUNTER — Encounter (HOSPITAL_COMMUNITY)
Admission: RE | Disposition: A | Discharge status: Home / Self Care | Payer: Self-pay | Source: Ambulatory Visit | Attending: Internal Medicine

## 2014-12-27 ENCOUNTER — Ambulatory Visit (HOSPITAL_COMMUNITY): Payer: Medicare HMO | Admitting: Anesthesiology

## 2014-12-27 ENCOUNTER — Encounter (HOSPITAL_COMMUNITY): Payer: Self-pay | Admitting: *Deleted

## 2014-12-27 ENCOUNTER — Ambulatory Visit (HOSPITAL_COMMUNITY)
Admission: RE | Admit: 2014-12-27 | Discharge: 2014-12-27 | Disposition: A | Discharge status: Home / Self Care | Payer: Medicare HMO | Source: Ambulatory Visit | Attending: Internal Medicine | Admitting: Internal Medicine

## 2014-12-27 DIAGNOSIS — K3189 Other diseases of stomach and duodenum: Secondary | ICD-10-CM | POA: Diagnosis not present

## 2014-12-27 DIAGNOSIS — K449 Diaphragmatic hernia without obstruction or gangrene: Secondary | ICD-10-CM | POA: Diagnosis not present

## 2014-12-27 DIAGNOSIS — K648 Other hemorrhoids: Secondary | ICD-10-CM | POA: Diagnosis not present

## 2014-12-27 DIAGNOSIS — G4733 Obstructive sleep apnea (adult) (pediatric): Secondary | ICD-10-CM | POA: Insufficient documentation

## 2014-12-27 DIAGNOSIS — K649 Unspecified hemorrhoids: Secondary | ICD-10-CM | POA: Insufficient documentation

## 2014-12-27 DIAGNOSIS — K2270 Barrett's esophagus without dysplasia: Secondary | ICD-10-CM | POA: Diagnosis not present

## 2014-12-27 DIAGNOSIS — G2581 Restless legs syndrome: Secondary | ICD-10-CM | POA: Insufficient documentation

## 2014-12-27 DIAGNOSIS — Z880 Allergy status to penicillin: Secondary | ICD-10-CM | POA: Insufficient documentation

## 2014-12-27 DIAGNOSIS — Z87891 Personal history of nicotine dependence: Secondary | ICD-10-CM | POA: Insufficient documentation

## 2014-12-27 DIAGNOSIS — F329 Major depressive disorder, single episode, unspecified: Secondary | ICD-10-CM | POA: Insufficient documentation

## 2014-12-27 DIAGNOSIS — K219 Gastro-esophageal reflux disease without esophagitis: Secondary | ICD-10-CM | POA: Insufficient documentation

## 2014-12-27 DIAGNOSIS — K921 Melena: Secondary | ICD-10-CM | POA: Insufficient documentation

## 2014-12-27 DIAGNOSIS — Z7984 Long term (current) use of oral hypoglycemic drugs: Secondary | ICD-10-CM | POA: Insufficient documentation

## 2014-12-27 DIAGNOSIS — G40909 Epilepsy, unspecified, not intractable, without status epilepticus: Secondary | ICD-10-CM | POA: Insufficient documentation

## 2014-12-27 DIAGNOSIS — Z882 Allergy status to sulfonamides status: Secondary | ICD-10-CM | POA: Insufficient documentation

## 2014-12-27 DIAGNOSIS — Z96641 Presence of right artificial hip joint: Secondary | ICD-10-CM | POA: Insufficient documentation

## 2014-12-27 DIAGNOSIS — K2950 Unspecified chronic gastritis without bleeding: Secondary | ICD-10-CM | POA: Insufficient documentation

## 2014-12-27 DIAGNOSIS — Z7982 Long term (current) use of aspirin: Secondary | ICD-10-CM | POA: Insufficient documentation

## 2014-12-27 DIAGNOSIS — E119 Type 2 diabetes mellitus without complications: Secondary | ICD-10-CM | POA: Insufficient documentation

## 2014-12-27 DIAGNOSIS — Z86010 Personal history of colonic polyps: Secondary | ICD-10-CM | POA: Insufficient documentation

## 2014-12-27 DIAGNOSIS — H3530 Unspecified macular degeneration: Secondary | ICD-10-CM | POA: Insufficient documentation

## 2014-12-27 DIAGNOSIS — Z885 Allergy status to narcotic agent status: Secondary | ICD-10-CM | POA: Insufficient documentation

## 2014-12-27 DIAGNOSIS — J45909 Unspecified asthma, uncomplicated: Secondary | ICD-10-CM | POA: Insufficient documentation

## 2014-12-27 HISTORY — PX: BIOPSY: SHX5522

## 2014-12-27 HISTORY — PX: COLONOSCOPY WITH PROPOFOL: SHX5780

## 2014-12-27 HISTORY — PX: ESOPHAGOGASTRODUODENOSCOPY (EGD) WITH PROPOFOL: SHX5813

## 2014-12-27 LAB — GLUCOSE, CAPILLARY
GLUCOSE-CAPILLARY: 108 mg/dL — AB (ref 65–99)
Glucose-Capillary: 125 mg/dL — ABNORMAL HIGH (ref 65–99)

## 2014-12-27 SURGERY — COLONOSCOPY WITH PROPOFOL
Anesthesia: Monitor Anesthesia Care

## 2014-12-27 MED ORDER — FENTANYL CITRATE (PF) 100 MCG/2ML IJ SOLN
INTRAMUSCULAR | Status: AC
  Filled 2014-12-27: qty 2

## 2014-12-27 MED ORDER — PROPOFOL 500 MG/50ML IV EMUL
INTRAVENOUS | Status: DC | PRN
  Administered 2014-12-27: 12:00:00 via INTRAVENOUS
  Administered 2014-12-27: 125 ug/kg/min via INTRAVENOUS

## 2014-12-27 MED ORDER — PROPOFOL 10 MG/ML IV BOLUS
INTRAVENOUS | Status: DC | PRN
  Administered 2014-12-27 (×4): 10 mg via INTRAVENOUS

## 2014-12-27 MED ORDER — FENTANYL CITRATE (PF) 100 MCG/2ML IJ SOLN
25.0000 ug | INTRAMUSCULAR | Status: AC
  Administered 2014-12-27 (×2): 25 ug via INTRAVENOUS

## 2014-12-27 MED ORDER — PROPOFOL 10 MG/ML IV BOLUS
INTRAVENOUS | Status: AC
  Filled 2014-12-27: qty 20

## 2014-12-27 MED ORDER — MIDAZOLAM HCL 2 MG/2ML IJ SOLN
1.0000 mg | INTRAMUSCULAR | Status: DC | PRN
  Administered 2014-12-27 (×3): 2 mg via INTRAVENOUS
  Filled 2014-12-27 (×2): qty 2

## 2014-12-27 MED ORDER — LACTATED RINGERS IV SOLN
INTRAVENOUS | Status: DC
  Administered 2014-12-27: 1000 mL via INTRAVENOUS

## 2014-12-27 MED ORDER — ONDANSETRON HCL 4 MG/2ML IJ SOLN
INTRAMUSCULAR | Status: AC
  Filled 2014-12-27: qty 2

## 2014-12-27 MED ORDER — LIDOCAINE VISCOUS 2 % MT SOLN
15.0000 mL | OROMUCOSAL | Status: AC
  Administered 2014-12-27 (×2): 5 mL via OROMUCOSAL

## 2014-12-27 MED ORDER — STERILE WATER FOR IRRIGATION IR SOLN
Status: DC | PRN
  Administered 2014-12-27: 11:00:00

## 2014-12-27 MED ORDER — FENTANYL CITRATE (PF) 100 MCG/2ML IJ SOLN: 25.0000 ug | INTRAMUSCULAR | Status: DC | PRN

## 2014-12-27 MED ORDER — LIDOCAINE VISCOUS 2 % MT SOLN
OROMUCOSAL | Status: AC
  Filled 2014-12-27: qty 15

## 2014-12-27 MED ORDER — ONDANSETRON HCL 4 MG/2ML IJ SOLN
4.0000 mg | Freq: Once | INTRAMUSCULAR | Status: AC
  Administered 2014-12-27: 4 mg via INTRAVENOUS

## 2014-12-27 MED ORDER — WATER FOR IRRIGATION, STERILE IR SOLN
Status: DC | PRN
  Administered 2014-12-27: 1000 mL

## 2014-12-27 MED ORDER — ONDANSETRON HCL 4 MG/2ML IJ SOLN: 4.0000 mg | Freq: Once | INTRAMUSCULAR | Status: DC | PRN

## 2014-12-27 MED ORDER — MIDAZOLAM HCL 2 MG/2ML IJ SOLN
INTRAMUSCULAR | Status: AC
  Filled 2014-12-27: qty 2

## 2014-12-27 SURGICAL SUPPLY — 12 items
BLOCK BITE 60FR ADLT L/F BLUE (MISCELLANEOUS) ×2 IMPLANT
ELECT REM PT RETURN 9FT ADLT (ELECTROSURGICAL) ×4
ELECTRODE REM PT RTRN 9FT ADLT (ELECTROSURGICAL) IMPLANT
FORCEPS BIOP RAD 4 LRG CAP 4 (CUTTING FORCEPS) ×2 IMPLANT
FORMALIN 10 PREFIL 20ML (MISCELLANEOUS) ×4 IMPLANT
KIT ENDO PROCEDURE PEN (KITS) ×4 IMPLANT
MANIFOLD NEPTUNE II (INSTRUMENTS) ×2 IMPLANT
SNARE ROTATE MED OVAL 20MM (MISCELLANEOUS) IMPLANT
SNARE SHORT THROW 13M SML OVAL (MISCELLANEOUS) ×2 IMPLANT
TRAP SPECIMEN MUCOUS 40CC (MISCELLANEOUS) IMPLANT
TUBING IRRIGATION ENDOGATOR (MISCELLANEOUS) ×2 IMPLANT
WATER STERILE IRR 1000ML POUR (IV SOLUTION) ×2 IMPLANT

## 2014-12-27 NOTE — Interval H&P Note (Signed)
History and Physical Interval Note:  12/27/2014 11:03 AM  Adrienne [name]  has presented today for surgery, with the diagnosis of HEMATOCHEZIA/HISTORY OF BARRETT  The various methods of treatment have been discussed with the patient and family. After consideration of risks, benefits and other options for treatment, the patient has consented to  Procedure(s) with comments: COLONOSCOPY WITH PROPOFOL (N/A) - 1215 - moved to 11:00 - office to notify pt to arrive at 9:30 ESOPHAGOGASTRODUODENOSCOPY (EGD) WITH PROPOFOL (N/A) as a surgical intervention .  The patient's history has been reviewed, patient examined, no change in status, stable for surgery.  I have reviewed the patient's chart and labs.  Questions were answered to the patient's satisfaction.     Adrienne [name]  No change. Surveillance EGD and diagnostic colonoscopy per plan.  The risks, benefits, limitations, imponderables and alternatives regarding both EGD and colonoscopy have been reviewed with the patient. Questions have been answered. All parties agreeable.

## 2014-12-27 NOTE — Op Note (Signed)
[facility] [address] [place], [address]   COLONOSCOPY PROCEDURE REPORT  PATIENT: [name], Adrienne  MR#: [id number] BIRTHDATE: January 02, 1956 , 46  yrs. old GENDER: female ENDOSCOPIST: R.  Garfield Cornea, MD FACP [facility] REFERRED JE:HUDJ Tressie Stalker, M.D. PROCEDURE DATE:  January 17, 2015 PROCEDURE:   Colonoscopy, diagnostic INDICATIONS:Paper hematochezia. MEDICATIONS: Deep sedation per Dr.  [facility] ASA CLASS:       Class II  CONSENT: The risks, benefits, alternatives and imponderables including but not limited to bleeding, perforation as well as the possibility of a missed lesion have been reviewed.  The potential for biopsy, lesion removal, etc. have also been discussed. Questions have been answered.  All parties agreeable.  Please see the history and physical in the medical record for more information.  DESCRIPTION OF PROCEDURE:   After the risks benefits and alternatives of the procedure were thoroughly explained, informed consent was obtained.  The digital rectal exam revealed no abnormalities of the rectum.   The     endoscope was introduced through the anus and advanced to the cecum, which was identified by both the appendix and ileocecal valve. No adverse events experienced.   The quality of the prep was adequate  The instrument was then slowly withdrawn as the colon was fully examined. Estimated blood loss is zero unless otherwise noted in this procedure report.      COLON FINDINGS: Anal canal /internal hemorrhoids present. Otherwise, remainder rectal mucosa appeared normal.  Redundant colon.  External abdominal pressure and changing of the patient's position required to reach the cecum.  The colonic mucosa, however, appeared normal otherwise.  Retroflexion was performed. .  Withdrawal time=8 minutes     .  The scope was withdrawn and the procedure completed. COMPLICATIONS: There were no immediate complications.  ENDOSCOPIC  IMPRESSION: Internal hemorrhoids?"likely source of hematochezia. Redundant colon.  RECOMMENDATIONS: Course of Anusol suppositories 1 per rectum twice a day. Depending on her response, she may be a reasonably good hemorrhoid banding candidate. See EGD report.  eSigned:  R. Garfield Cornea, MD Rosalita Chessman [facility] 01/17/2015 12:55 PM   cc:  CPT CODES: ICD CODES:  The ICD and CPT codes recommended by this software are interpretations from the data that the clinical staff has captured with the software.  The verification of the translation of this report to the ICD and CPT codes and modifiers is the sole responsibility of the health care institution and practicing physician where this report was generated.  [facility]. will not be held responsible for the validity of the ICD and CPT codes included on this report.  AMA assumes no liability for data contained or not contained herein. CPT is a Designer, television/film set of the [employer].  PATIENT NAME:  Adrienne, [name] MR#: [id number]

## 2014-12-27 NOTE — Transfer of Care (Signed)
Immediate Anesthesia Transfer of Care Note  Patient: Adrienne [name]  Procedure(s) Performed: Procedure(s) with comments: COLONOSCOPY WITH PROPOFOL (N/A) - in cecum at 1143, 8min internal hemorrhoids ESOPHAGOGASTRODUODENOSCOPY (EGD) WITH PROPOFOL (N/A)  Patient Location: PACU  Anesthesia Type:MAC  Level of Consciousness: awake, alert  and oriented  Airway & Oxygen Therapy: Patient Spontanous Breathing  Post-op Assessment: Report given to RN  Post vital signs: Reviewed and stable  Last Vitals:  Filed Vitals:   12/27/14 1105  BP: 132/71  Pulse:   Temp:   Resp: 12    Complications: No apparent anesthesia complications

## 2014-12-27 NOTE — H&P (View-Only) (Signed)
Primary Care Physician:  No PCP Per Patient Primary Gastroenterologist:  Dr. Gala Romney  Pre-Procedure History & Physical: HPI:  Adrienne [name] is a 59 y.o. female here for for follow-up GERD and known Barrett's esophagus.  Last seen in 2012. EGD back in 2008 demonstrated Barrett's esophagus. She underwent a colonoscopy that time for screening purposes which revealed a friable anal canal and a hyperplastic polyp which was removed.  She was seen in 2012 to set up a surveillance EGD. She did not follow through. She states she had insurance issues; now insured and wants to update her EGD  On omeprazole 20 mg daily - continues to control her reflux symptoms very well. She denies any dysphagia or melena. No early satiety, nausea or vomiting. She has recently experienced some hematochezia spontaneously without straining.She typically has a bowel movement every day.  Past Medical History  Diagnosis Date  . Allergic rhinitis   . Anemia     leukocytosis/thrombocytosis--Dr. Tressie Stalker  . Asthma   . Depression     ? Bipolar Disorder--Dr, Jimmye Norman  . GERD (gastroesophageal reflux disease)     Barrett's esophagus  . Low back pain   . Seizure disorder ([place])     ? absence  . Macular degeneration, bilateral     Dry-Dr. Iona Hansen  . Arthritis   . Hx of migraines   . 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 OSA (obstructive sleep apnea)   . RLS (restless legs syndrome)   . NIDDM (non-insulin dependent diabetes mellitus)   . Status post right hip replacement   . Status post rotator cuff surgery   . Closed head injury 2005    Past Surgical History  Procedure Laterality Date  . Colonoscopy with esophagogastroduodenoscopy (egd)  12/08    TCS/EGD: + Barrett's,+ gastritis, friable anal canal, hyperplastic polyp  . Appendectomy    . Vesicovaginal fistula closure w/ tah    . Rotator cuff repair      Bilaterally  . Revision total hip arthroplasty      Right  . Tonsillectomy    . Fracture surgery Left  03/07/2013    knee  . Orif tibia plateau Left 03/07/2013    Procedure: OPEN REDUCTION INTERNAL FIXATION (ORIF) TIBIAL PLATEAU;  Surgeon: Renette Butters, MD;  Location: [facility];  Service: Orthopedics;  Laterality: Left;  . Shoulder injection Left 03/07/2013    Procedure: SHOULDER INJECTION ; Depomedrol + 0.25% Marcaine mixture 4:1;  Surgeon: Renette Butters, MD;  Location: [facility];  Service: Orthopedics;  Laterality: Left;  . 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 patulous esophagogastric junction, small hiatal hernia, gastric mucosa o/w appeared normal. normal D1 and D2    Prior to Admission medications   Medication Sig Start Date End Date Taking? Authorizing Provider  ALPRAZolam Duanne Moron) 1 MG tablet Take 1 tablet (1 mg total) by mouth at bedtime as needed. 03/13/13  Yes Tiffany L Reed, DO  aspirin 81 MG tablet Take 81 mg by mouth daily.   Yes Historical Provider, MD  BIOTIN PO Take 1 capsule by mouth daily.   Yes Historical Provider, MD  Cholecalciferol (VITAMIN D3) 400 UNITS CAPS Take by mouth.     Yes Historical Provider, MD  Cinnamon 500 MG TABS Take by mouth.     Yes Historical  Provider, MD  Cyanocobalamin (VITAMIN B 12 PO) Take by mouth.     Yes Historical Provider, MD  cyclobenzaprine (FLEXERIL) 10 MG tablet Take 10 mg by mouth at bedtime.   Yes Historical Provider, MD  desvenlafaxine (PRISTIQ) 100 MG 24 hr tablet Take 100 mg by mouth daily.     Yes Historical Provider, MD  Flaxseed, Linseed, (FLAX SEED OIL PO) Take by mouth.     Yes Historical Provider, MD  Melatonin 10 MG CAPS Take 10 mg by mouth at bedtime.   Yes Historical Provider, MD  metFORMIN (GLUCOPHAGE) 500 MG tablet Take 1 tablet (500 mg total) by mouth 2 (two) times daily with a meal. 04/14/13  Yes  Doran Heater, MD  methylphenidate (RITALIN) 10 MG tablet Take 1 tablet (10 mg total) by mouth 2 (two) times daily. 03/13/13  Yes Tiffany L Reed, DO  Multiple Vitamin (MULTIVITAMIN) capsule Take 1 capsule by mouth daily.     Yes Historical Provider, MD  omega-3 acid ethyl esters (LOVAZA) 1 G capsule  03/20/13  Yes Historical Provider, MD  omeprazole (PRILOSEC) 20 MG capsule Take 1 capsule (20 mg total) by mouth daily. 04/14/13  Yes Doran Heater, MD  prazosin (MINIPRESS) 1 MG capsule Take 1 mg by mouth at bedtime.   Yes Historical Provider, MD  vitamin E 1000 UNIT capsule Take 1,000 Units by mouth daily.     Yes Historical Provider, MD  ABILIFY 10 MG tablet  05/11/13   Historical Provider, MD  atorvastatin (LIPITOR) 10 MG tablet Take 2 tablets (20 mg total) by mouth daily. Patient not taking: Reported on 12/11/2014 04/14/13   Doran Heater, MD  clindamycin (CLEOCIN) 300 MG capsule Take 300 mg by mouth 4 (four) times daily.    Historical Provider, MD  furosemide (LASIX) 20 MG tablet Take 20 mg by mouth daily.    Historical Provider, MD  HYDROcodone-acetaminophen (NORCO/VICODIN) 5-325 MG per tablet Take 1 tablet by mouth every 4 (four) hours as needed for moderate pain.    Historical Provider, MD  oxyCODONE-acetaminophen (PERCOCET/ROXICET) 5-325 MG per tablet Take 1 tablet by mouth as needed. 03/22/13   Historical Provider, MD  polyethylene glycol (MIRALAX / GLYCOLAX) packet Take 17 g by mouth daily as needed. Patient not taking: Reported on 12/11/2014 03/10/13   Oswald Hillock, MD  polyethylene glycol-electrolytes (TRILYTE) 420 G solution Take 4,000 mLs by mouth as directed. 12/11/14   Daneil Dolin, MD  senna-docusate (SENOKOT-S) 8.6-50 MG per tablet Take 1 tablet by mouth 2 (two) times daily. Patient not taking: Reported on 12/11/2014 03/10/13   Oswald Hillock, MD    Allergies as of 12/11/2014 - Review Complete 12/11/2014  Allergen Reaction Noted  . Codeine    . Morphine and related Itching 03/02/2013  .  Dilaudid [hydromorphone hcl] Itching and Rash 05/23/2013  . Penicillins Rash   . Sulfonamide derivatives Rash     Family History  Problem Relation Age of Onset  . Atrial fibrillation Mother   . Diabetes Maternal Grandfather   . Cancer Mother   . Cancer Maternal Grandfather   . Thyroid disease Son     Social History   Social History  . Marital Status: Divorced    Spouse Name: N/A  . Number of Children: 3  . Years of Education: 14   Occupational History  . on disability     due to head injury, 2005   Social History Main Topics  . Smoking status: Former Smoker --  1.00 packs/day for 40 years    Types: Cigarettes    Start date: 03/03/1971    Quit date: 12/01/2012  . Smokeless tobacco: Never Used  . Alcohol Use: No  . Drug Use: No  . Sexual Activity: Yes   Other Topics Concern  . Not on file   Social History Narrative    Review of Systems: See HPI, otherwise negative ROS  Physical Exam: BP 161/82 mmHg  Pulse 79  Temp(Src) 97.8 F (36.6 C) (Oral)  Ht 5\' 5"  (1.651 m)  Wt 183 lb 9.6 oz (83.28 kg)  BMI 30.55 kg/m2 General:   Alert,  Well-developed, well-nourished, pleasant and cooperative in NAD. Accompanied by her sister. Skin:  Intact without significant lesions or rashes. Neck:  Supple; no masses or thyromegaly. No significant cervical adenopathy. Lungs:  Clear throughout to auscultation.   No wheezes, crackles, or rhonchi. No acute distress. Heart:  Regular rate and rhythm; no murmurs, clicks, rubs,  or gallops. Abdomen: Non-distended, normal bowel sounds.  Soft and nontender without appreciable mass or hepatosplenomegaly.  Pulses:  Normal pulses noted. Extremities:  Without clubbing or edema.  Impression:  Pleasant 59 year old lady with multiple medical problems on multiple medications with known Barrett's esophagus presents for consideration of surveillance EGD. Barrett's esophagus which goes back to at least 2008. She's not having any alarm symptoms. She is  overdue for surveillance examination. Recent painless hematochezia also an issue. May well be from benign anorectal source, however, it's been in good a year since she had a colonoscopy.  Recommendations: Surveillance colonoscopy with biopsy in the near future. I have also offered her diagnostic colonoscopy the same time.The risks, benefits, limitations, imponderables and alternatives regarding both EGD and colonoscopy have been reviewed with the patient. Questions have been answered. All parties agreeable.   Because of multiple co-morbidities and polypharmacy, will enlist the help of anesthesia for deep sedation for these procedures.  Further recommendations to follow.     Notice: This dictation was prepared with Dragon dictation along with smaller phrase technology. Any transcriptional errors that result from this process are unintentional and may not be corrected upon review.

## 2014-12-27 NOTE — Anesthesia Postprocedure Evaluation (Signed)
  Anesthesia Post-op Note  Patient: Adrienne [name]  Procedure(s) Performed: Procedure(s) with comments: COLONOSCOPY WITH PROPOFOL (N/A) - in cecum at 1143, 7min internal hemorrhoids ESOPHAGOGASTRODUODENOSCOPY (EGD) WITH PROPOFOL (N/A) Esophageal BIOPSY  Patient Location: PACU  Anesthesia Type:MAC  Level of Consciousness: awake, alert  and oriented  Airway and Oxygen Therapy: Patient Spontanous Breathing  Post-op Pain: none  Post-op Assessment: Post-op Vital signs reviewed, Patient's Cardiovascular Status Stable, Respiratory Function Stable, Patent Airway and No signs of Nausea or vomiting              Post-op Vital Signs: Reviewed and stable  Last Vitals:  Filed Vitals:   12/27/14 1202  BP: 111/61  Pulse: 70  Temp:   Resp:     Complications: No apparent anesthesia complications

## 2014-12-27 NOTE — Op Note (Signed)
[facility] [address] [place], [address]   ENDOSCOPY PROCEDURE REPORT  PATIENT: Oyindamola, Key  MR#: [id number] BIRTHDATE: Sep 23, 1955 , 50  yrs. old GENDER: female ENDOSCOPIST: R.  Garfield Cornea, MD FACP FACG REFERRED BY:  Everardo All, M.D. PROCEDURE DATE:  01-13-15 PROCEDURE:  EGD w/ biopsy INDICATIONS:  Surveillance examination; history of Barrett's esophagus. MEDICATIONS: Deep sedation per Dr.  [facility] ASA CLASS:      Class II  CONSENT: The risks, benefits, limitations, alternatives and imponderables have been discussed.  The potential for biopsy, esophogeal dilation, etc. have also been reviewed.  Questions have been answered.  All parties agreeable.  Please see the history and physical in the medical record for more information.  DESCRIPTION OF PROCEDURE: After the risks benefits and alternatives of the procedure were thoroughly explained, informed consent was obtained.  The    endoscope was introduced through the mouth and advanced to the second portion of the duodenum , limited by Without limitations. The instrument was slowly withdrawn as the mucosa was fully examined. Estimated blood loss is zero unless otherwise noted in this procedure report.    Abnormal esophageal mucosa with salmon-colored epithelium coming up in 3 "tongues" a good 2 cm up into the distal esophagus above the GE junction.  No nodularity.  No esophagitis.  Stomach empty.  2 cm hiatal hernia.  Multiple antral erosions present.  No ulcer or infiltrating process seen.  Patent pylorus.  Normal-appearing first and second portion of the duodenum.  The abnormal distal esophagus as well as the abnormal stomach biopsied.  Retroflexed views revealed as previously described. The scope was then withdrawn from the patient and the procedure completed.  COMPLICATIONS: There were no immediate complications. EBL 2 mL ENDOSCOPIC IMPRESSION: Abnormal distal esophagus  consistent with prior diagnosis of Barrett's esophagus - status post biopsy.  Hiatal hernia. Antral erosions?"status post gastric biopsy  RECOMMENDATIONS: Continue omeprazole 20 mg daily. Further recommendations to follow pending review of pathology.  See colonoscopy report  REPEAT EXAM:  eSigned:  R. Garfield Cornea, MD Rosalita Chessman [facility] 13-Jan-2015 12:49 PM    CC:  CPT CODES: ICD CODES:  The ICD and CPT codes recommended by this software are interpretations from the data that the clinical staff has captured with the software.  The verification of the translation of this report to the ICD and CPT codes and modifiers is the sole responsibility of the health care institution and practicing physician where this report was generated.  [employer] will not be held responsible for the validity of the ICD and CPT codes included on this report.  AMA assumes no liability for data contained or not contained herein. CPT is a Designer, television/film set of the [employer].

## 2014-12-27 NOTE — Anesthesia Preprocedure Evaluation (Signed)
Anesthesia Evaluation  Patient identified by MRN, date of birth, ID band Patient awake    Reviewed: Allergy & Precautions, H&P , NPO status , Patient's Chart, lab work & pertinent test results, reviewed documented beta blocker date and time   History of Anesthesia Complications (+) history of anesthetic complications ( "slow to wake up sometimes")  Airway Mallampati: II  TM Distance: >3 FB Neck ROM: full    Dental  (+) Teeth Intact   Pulmonary asthma , sleep apnea , former smoker,    breath sounds clear to auscultation       Cardiovascular negative cardio ROS   Rhythm:regular Rate:Normal     Neuro/Psych  Headaches, Seizures -, Well Controlled,  PSYCHIATRIC DISORDERS Anxiety Depression  Neuromuscular disease negative neurological ROS     GI/Hepatic Neg liver ROS, GERD  Medicated and Controlled,  Endo/Other  diabetes, Type 2, Oral Hypoglycemic Agents  Renal/GU negative Renal ROS  negative genitourinary   Musculoskeletal   Abdominal   Peds  Hematology  (+) anemia ,   Anesthesia Other Findings   Reproductive/Obstetrics negative OB ROS                             Anesthesia Physical Anesthesia Plan  ASA: III  Anesthesia Plan: MAC   Post-op Pain Management:    Induction: Intravenous  Airway Management Planned: Simple Face Mask  Additional Equipment:   Intra-op Plan:   Post-operative Plan:   Informed Consent: I have reviewed the patients History and Physical, chart, labs and discussed the procedure including the risks, benefits and alternatives for the proposed anesthesia with the patient or authorized representative who has indicated his/her understanding and acceptance.     Plan Discussed with:   Anesthesia Plan Comments:         Anesthesia Quick Evaluation

## 2014-12-27 NOTE — Discharge Instructions (Signed)
Colonoscopy Discharge Instructions  Read the instructions outlined below and refer to this sheet in the next few weeks. These discharge instructions provide you with general information on caring for yourself after you leave the hospital. Your doctor may also give you specific instructions. While your treatment has been planned according to the most current medical practices available, unavoidable complications occasionally occur. If you have any problems or questions after discharge, call Dr. Gala Romney at [phone]. ACTIVITY  You may resume your regular activity, but move at a slower pace for the next 24 hours.   Take frequent rest periods for the next 24 hours.   Walking will help get rid of the air and reduce the bloated feeling in your belly (abdomen).   No driving for 24 hours (because of the medicine (anesthesia) used during the test).    Do not sign any important legal documents or operate any machinery for 24 hours (because of the anesthesia used during the test).  NUTRITION  Drink plenty of fluids.   You may resume your normal diet as instructed by your doctor.   Begin with a light meal and progress to your normal diet. Heavy or fried foods are harder to digest and may make you feel sick to your stomach (nauseated).   Avoid alcoholic beverages for 24 hours or as instructed.  MEDICATIONS  You may resume your normal medications unless your doctor tells you otherwise.  WHAT YOU CAN EXPECT TODAY  Some feelings of bloating in the abdomen.   Passage of more gas than usual.   Spotting of blood in your stool or on the toilet paper.  IF YOU HAD POLYPS REMOVED DURING THE COLONOSCOPY:  No aspirin products for 7 days or as instructed.   No alcohol for 7 days or as instructed.   Eat a soft diet for the next 24 hours.  FINDING OUT THE RESULTS OF YOUR TEST Not all test results are available during your visit. If your test results are not back during the visit, make an appointment  with your caregiver to find out the results. Do not assume everything is normal if you have not heard from your caregiver or the medical facility. It is important for you to follow up on all of your test results.  SEEK IMMEDIATE MEDICAL ATTENTION IF:  You have more than a spotting of blood in your stool.   Your belly is swollen (abdominal distention).   You are nauseated or vomiting.   You have a temperature over 101.  You have abdominal pain or discomfort that is severe or gets worse throughout the day. EGD Discharge instructions Please read the instructions outlined below and refer to this sheet in the next few weeks. These discharge instructions provide you with general information on caring for yourself after you leave the hospital. Your doctor may also give you specific instructions. While your treatment has been planned according to the most current medical practices available, unavoidable complications occasionally occur. If you have any problems or questions after discharge, please call your doctor. ACTIVITY You may resume your regular activity but move at a slower pace for the next 24 hours.  Take frequent rest periods for the next 24 hours.  Walking will help expel (get rid of) the air and reduce the bloated feeling in your abdomen.  No driving for 24 hours (because of the anesthesia (medicine) used during the test).  You may shower.  Do not sign any important legal documents or operate any machinery for 24  hours (because of the anesthesia used during the test).  NUTRITION Drink plenty of fluids.  You may resume your normal diet.  Begin with a light meal and progress to your normal diet.  Avoid alcoholic beverages for 24 hours or as instructed by your caregiver.  MEDICATIONS You may resume your normal medications unless your caregiver tells you otherwise.  WHAT YOU CAN EXPECT TODAY You may experience abdominal discomfort such as a feeling of fullness or gas pains.   FOLLOW-UP Your doctor will discuss the results of your test with you.  SEEK IMMEDIATE MEDICAL ATTENTION IF ANY OF THE FOLLOWING OCCUR: Excessive nausea (feeling sick to your stomach) and/or vomiting.  Severe abdominal pain and distention (swelling).  Trouble swallowing.  Temperature over 101 F (37.8 C).  Rectal bleeding or vomiting of blood.    Information on hemorrhoids, Barrett's esophagus and GERD  Continue omeprazole 20 mg daily  Course of Anusol suppositories for hemorrhoids  Further recommendations to follow pending review of pathology report       Barrett's Esophagus Barrett's esophagus occurs when the lining of the esophagus is damaged. The esophagus is the tube that carries food from the mouth to the stomach. With Barrett's esophagus, the lining of the esophagus gets replaced by material that is similar to the lining in the intestines. This process is called intestinal metaplasia. A small number of people with Barrett's esophagus develop esophageal cancer. CAUSES  The exact cause of Barrett's esophagus is unknown. SYMPTOMS  Most people with Barrett's esophagus do not have symptoms. However, many patients also have gastroesophageal reflux disease (GERD). GERD can cause heartburn, trouble swallowing, and a dry cough. DIAGNOSIS Barrett's esophagus is diagnosed by an exam called upper gastrointestinal endoscopy. A thin, flexible tube (endoscope) is passed down the esophagus. The endoscope has a light and camera on the end. Your caregiver uses the endoscope to view the inside of the esophagus. A tissue sample may also be taken and examined under a microscope (biopsy). If cancer cells are found during the biopsy, this condition is called dysplasia. TREATMENT  If you have no dysplasia or low-grade dysplasia, your caregiver may recommend no treatment or only taking medicines to treat GERD. Sometimes, taking acid-blocking drugs to treat GERD helps improve the tissue affected by  Barrett's esophagus. Your caregiver may also recommend periodic esophageal exams. If you have high-grade dysplasia, treatment may include removing the damaged parts of the esophagus. This can be done by heating, freezing, or surgically removing the tissue. In some cases, surgery may be done to remove most of the esophagus. The stomach is then attached to the remaining portion of the esophagus. HOME CARE INSTRUCTIONS  Take acid-blocking drugs for GERD if recommended by your caregiver.  Keep all follow-up appointments as directed by your caregiver. You may need periodic esophageal exams. SEEK IMMEDIATE MEDICAL CARE IF:  You have chest pain.  You have trouble swallowing.  You vomit blood or material that looks like coffee grounds.  Your stools are bright red or dark.   This information is not intended to replace advice given to you by your health care provider. Make sure you discuss any questions you have with your health care provider.   Document Released: 05/16/2003 Document Revised: 08/25/2011 Document Reviewed: 05/05/2011 Elsevier Interactive Patient Education 2016 [employer].  Gastroesophageal Reflux Disease, Adult Normally, food travels down the esophagus and stays in the stomach to be digested. However, when a person has gastroesophageal reflux disease (GERD), food and stomach acid move back up  into the esophagus. When this happens, the esophagus becomes sore and inflamed. Over time, GERD can create small holes (ulcers) in the lining of the esophagus.  CAUSES This condition is caused by a problem with the muscle between the esophagus and the stomach (lower esophageal sphincter, or LES). Normally, the LES muscle closes after food passes through the esophagus to the stomach. When the LES is weakened or abnormal, it does not close properly, and that allows food and stomach acid to go back up into the esophagus. The LES can be weakened by certain dietary substances, medicines, and medical  conditions, including:  Tobacco use.  Pregnancy.  Having a hiatal hernia.  Heavy alcohol use.  Certain foods and beverages, such as coffee, chocolate, onions, and peppermint. RISK FACTORS This condition is more likely to develop in:  People who have an increased body weight.  People who have connective tissue disorders.  People who use NSAID medicines. SYMPTOMS Symptoms of this condition include:  Heartburn.  Difficult or painful swallowing.  The feeling of having a lump in the throat.  Abitter taste in the mouth.  Bad breath.  Having a large amount of saliva.  Having an upset or bloated stomach.  Belching.  Chest pain.  Shortness of breath or wheezing.  Ongoing (chronic) cough or a night-time cough.  Wearing away of tooth enamel.  Weight loss. Different conditions can cause chest pain. Make sure to see your health care provider if you experience chest pain. DIAGNOSIS Your health care provider will take a medical history and perform a physical exam. To determine if you have mild or severe GERD, your health care provider may also monitor how you respond to treatment. You may also have other tests, including:  An endoscopy toexamine your stomach and esophagus with a small camera.  A test thatmeasures the acidity level in your esophagus.  A test thatmeasures how much pressure is on your esophagus.  A barium swallow or modified barium swallow to show the shape, size, and functioning of your esophagus. TREATMENT The goal of treatment is to help relieve your symptoms and to prevent complications. Treatment for this condition may vary depending on how severe your symptoms are. Your health care provider may recommend:  Changes to your diet.  Medicine.  Surgery. HOME CARE INSTRUCTIONS Diet  Follow a diet as recommended by your health care provider. This may involve avoiding foods and drinks such as:  Coffee and tea (with or without  caffeine).  Drinks that containalcohol.  Energy drinks and sports drinks.  Carbonated drinks or sodas.  Chocolate and cocoa.  Peppermint and mint flavorings.  Garlic and onions.  Horseradish.  Spicy and acidic foods, including peppers, chili powder, curry powder, vinegar, hot sauces, and barbecue sauce.  Citrus fruit juices and citrus fruits, such as oranges, lemons, and limes.  Tomato-based foods, such as red sauce, chili, salsa, and pizza with red sauce.  Fried and fatty foods, such as donuts, french fries, potato chips, and high-fat dressings.  High-fat meats, such as hot dogs and fatty cuts of red and white meats, such as rib eye steak, sausage, ham, and bacon.  High-fat dairy items, such as whole milk, butter, and cream cheese.  Eat small, frequent meals instead of large meals.  Avoid drinking large amounts of liquid with your meals.  Avoid eating meals during the 2-3 hours before bedtime.  Avoid lying down right after you eat.  Do not exercise right after you eat. General Instructions  Pay attention to  any changes in your symptoms.  Take over-the-counter and prescription medicines only as told by your health care provider. Do not take aspirin, ibuprofen, or other NSAIDs unless your health care provider told you to do so.  Do not use any tobacco products, including cigarettes, chewing tobacco, and e-cigarettes. If you need help quitting, ask your health care provider.  Wear loose-fitting clothing. Do not wear anything tight around your waist that causes pressure on your abdomen.  Raise (elevate) the head of your bed 6 inches (15cm).  Try to reduce your stress, such as with yoga or meditation. If you need help reducing stress, ask your health care provider.  If you are overweight, reduce your weight to an amount that is healthy for you. Ask your health care provider for guidance about a safe weight loss goal.  Keep all follow-up visits as told by your  health care provider. This is important. SEEK MEDICAL CARE IF:  You have new symptoms.  You have unexplained weight loss.  You have difficulty swallowing, or it hurts to swallow.  You have wheezing or a persistent cough.  Your symptoms do not improve with treatment.  You have a hoarse voice. SEEK IMMEDIATE MEDICAL CARE IF:  You have pain in your arms, neck, jaw, teeth, or back.  You feel sweaty, dizzy, or light-headed.  You have chest pain or shortness of breath.  You vomit and your vomit looks like blood or coffee grounds.  You faint.  Your stool is bloody or black.  You cannot swallow, drink, or eat.   This information is not intended to replace advice given to you by your health care provider. Make sure you discuss any questions you have with your health care provider.   Document Released: 12/03/2004 Document Revised: 11/14/2014 Document Reviewed: 06/20/2014 Elsevier Interactive Patient Education 2016 [employer].   Hemorrhoids Hemorrhoids are swollen veins around the rectum or anus. There are two types of hemorrhoids:   Internal hemorrhoids. These occur in the veins just inside the rectum. They may poke through to the outside and become irritated and painful.  External hemorrhoids. These occur in the veins outside the anus and can be felt as a painful swelling or hard lump near the anus. CAUSES  Pregnancy.   Obesity.   Constipation or diarrhea.   Straining to have a bowel movement.   Sitting for long periods on the toilet.  Heavy lifting or other activity that caused you to strain.  Anal intercourse. SYMPTOMS   Pain.   Anal itching or irritation.   Rectal bleeding.   Fecal leakage.   Anal swelling.   One or more lumps around the anus.  DIAGNOSIS  Your caregiver may be able to diagnose hemorrhoids by visual examination. Other examinations or tests that may be performed include:   Examination of the rectal area with a gloved hand  (digital rectal exam).   Examination of anal canal using a small tube (scope).   A blood test if you have lost a significant amount of blood.  A test to look inside the colon (sigmoidoscopy or colonoscopy). TREATMENT Most hemorrhoids can be treated at home. However, if symptoms do not seem to be getting better or if you have a lot of rectal bleeding, your caregiver may perform a procedure to help make the hemorrhoids get smaller or remove them completely. Possible treatments include:   Placing a rubber band at the base of the hemorrhoid to cut off the circulation (rubber band ligation).   Injecting  a chemical to shrink the hemorrhoid (sclerotherapy).   Using a tool to burn the hemorrhoid (infrared light therapy).   Surgically removing the hemorrhoid (hemorrhoidectomy).   Stapling the hemorrhoid to block blood flow to the tissue (hemorrhoid stapling).  HOME CARE INSTRUCTIONS   Eat foods with fiber, such as whole grains, beans, nuts, fruits, and vegetables. Ask your doctor about taking products with added fiber in them (fibersupplements).  Increase fluid intake. Drink enough water and fluids to keep your urine clear or pale yellow.   Exercise regularly.   Go to the bathroom when you have the urge to have a bowel movement. Do not wait.   Avoid straining to have bowel movements.   Keep the anal area dry and clean. Use wet toilet paper or moist towelettes after a bowel movement.   Medicated creams and suppositories may be used or applied as directed.   Only take over-the-counter or prescription medicines as directed by your caregiver.   Take warm sitz baths for 15-20 minutes, 3-4 times a day to ease pain and discomfort.   Place ice packs on the hemorrhoids if they are tender and swollen. Using ice packs between sitz baths may be helpful.   Put ice in a plastic bag.   Place a towel between your skin and the bag.   Leave the ice on for 15-20 minutes, 3-4 times  a day.   Do not use a donut-shaped pillow or sit on the toilet for long periods. This increases blood pooling and pain.  SEEK MEDICAL CARE IF:  You have increasing pain and swelling that is not controlled by treatment or medicine.  You have uncontrolled bleeding.  You have difficulty or you are unable to have a bowel movement.  You have pain or inflammation outside the area of the hemorrhoids. MAKE SURE YOU:  Understand these instructions.  Will watch your condition.  Will get help right away if you are not doing well or get worse.   This information is not intended to replace advice given to you by your health care provider. Make sure you discuss any questions you have with your health care provider.   Document Released: 02/21/2000 Document Revised: 02/10/2012 Document Reviewed: 12/29/2011 Elsevier Interactive Patient Education [employer].

## 2014-12-28 ENCOUNTER — Encounter (HOSPITAL_COMMUNITY): Payer: Self-pay | Admitting: Internal Medicine

## 2014-12-30 ENCOUNTER — Encounter: Payer: Self-pay | Admitting: Internal Medicine

## 2014-12-31 ENCOUNTER — Telehealth: Payer: Self-pay

## 2014-12-31 ENCOUNTER — Encounter: Payer: Self-pay | Admitting: Internal Medicine

## 2014-12-31 NOTE — Telephone Encounter (Signed)
OV MADE FOR 11/1 at 1130 with AS

## 2014-12-31 NOTE — Telephone Encounter (Signed)
Per RMR- Send letter to patient.  Send copy of letter with path to referring provider and PCP.   Lets get her in to see ext. First week in nov (hemtochezia - if not resolved, can set up forr banding 11/11

## 2014-12-31 NOTE — Telephone Encounter (Signed)
Letter mailed to the pt. 

## 2015-01-01 ENCOUNTER — Telehealth: Payer: Self-pay | Admitting: Internal Medicine

## 2015-01-01 NOTE — Telephone Encounter (Signed)
[phone]  PLEASE CALL PATIENT, SHE STATES HER INSURANCE WILL NOT PAY FOR HER SUPPOSITORIES.

## 2015-01-01 NOTE — Telephone Encounter (Signed)
Let's get more information.

## 2015-01-04 MED ORDER — HYDROCORTISONE 2.5 % RE CREA: 1.0000 "application " | TOPICAL_CREAM | Freq: Two times a day (BID) | RECTAL | Status: DC

## 2015-01-04 NOTE — Telephone Encounter (Signed)
Tried to call Adrienne [name], pt was given anusol suppositories and the insurance wont pay for them. Can we send in a cream?

## 2015-01-04 NOTE — Addendum Note (Signed)
Addended by: Mahala Menghini on: 01/04/2015 11:53 AM   Modules accepted: Orders

## 2015-01-04 NOTE — Telephone Encounter (Signed)
RX done. 

## 2015-01-08 ENCOUNTER — Ambulatory Visit: Payer: Medicare HMO | Admitting: Gastroenterology

## 2015-01-09 ENCOUNTER — Encounter (INDEPENDENT_AMBULATORY_CARE_PROVIDER_SITE_OTHER): Payer: Medicare HMO | Admitting: Ophthalmology

## 2015-01-09 DIAGNOSIS — H43813 Vitreous degeneration, bilateral: Secondary | ICD-10-CM

## 2015-01-09 DIAGNOSIS — D3132 Benign neoplasm of left choroid: Secondary | ICD-10-CM

## 2015-01-09 DIAGNOSIS — H353221 Exudative age-related macular degeneration, left eye, with active choroidal neovascularization: Secondary | ICD-10-CM

## 2015-01-09 DIAGNOSIS — H353113 Nonexudative age-related macular degeneration, right eye, advanced atrophic without subfoveal involvement: Secondary | ICD-10-CM

## 2015-01-11 ENCOUNTER — Ambulatory Visit (INDEPENDENT_AMBULATORY_CARE_PROVIDER_SITE_OTHER): Payer: Medicare HMO | Admitting: Nurse Practitioner

## 2015-01-11 ENCOUNTER — Encounter: Payer: Self-pay | Admitting: Nurse Practitioner

## 2015-01-11 VITALS — BP 151/85 | HR 84 | Temp 97.6°F | Ht 65.0 in | Wt 184.4 lb

## 2015-01-11 DIAGNOSIS — K219 Gastro-esophageal reflux disease without esophagitis: Secondary | ICD-10-CM | POA: Diagnosis not present

## 2015-01-11 DIAGNOSIS — K649 Unspecified hemorrhoids: Secondary | ICD-10-CM | POA: Diagnosis not present

## 2015-01-11 DIAGNOSIS — K2270 Barrett's esophagus without dysplasia: Secondary | ICD-10-CM

## 2015-01-11 MED ORDER — OMEPRAZOLE 20 MG PO CPDR: 20.0000 mg | DELAYED_RELEASE_CAPSULE | Freq: Every day | ORAL | Status: DC

## 2015-01-11 NOTE — Patient Instructions (Signed)
1. Use the Anusol cream we sent into the pharmacy as needed for any hemorrhoid symptoms including minor bleeding on the toilet tissue, rectal pain or irritation. 2. Call if your hemorrhoid symptoms worsen and the cream does not help. At that point we can consider possible hemorrhoid banding in office with Dr. Gala Romney. 3. Call if any worsening upper GI symptoms such as worsening GERD symptoms, difficulty swallowing, or others. 4. I've sent in a refill of your omeprazole to the pharmacy. 5. We will have you return in 1 year for routine follow-up. 6. Call to be seen sooner if you have any worsening symptoms.

## 2015-01-11 NOTE — Progress Notes (Signed)
Referring Provider: No ref. provider found Primary Care Physician:  No PCP Per Patient Primary GI:  Dr. Gala Romney  Chief Complaint  Patient presents with  . Follow-up    HPI:   59 year old female presents for follow-up post procedure for toilet tissue hematochezia and internal hemorrhoids with consideration of possible hemorrhoid banding. EGD completed 12/27/2014 a surveillance exam for history of Barrett's. Found abnormal distal esophagus consistent with prior diagnosis of Barrett's, hiatal hernia, antral erosions, status post gastric biopsy. Surgical pathology found mild chronic inflammation of the stomach and esophageal biopsies with intestinal metaplasia consistent with Barrett's esophagus. Colonoscopy completed same day for paper hematochezia found internal hemorrhoids likely source of hematochezia and redundant colon. She was given a course of Anusol suppositories 1 per rectum twice a day and deemed reasonably good hemorrhoid banding candidate. Per telephone note if no resolution of hematochezia on follow-up visit can consider hemorrhoid banding appointment 01/18/2015.  Today she states she's doing ok. Has not had any further toilet tissue hematochezia. Previously she had only had one episode prior to colonoscopy. She wasn't awake that her rectal cream had been sent to the pharmacy and therefore hasn't used it, but will pick it up on the way home. She has been taking the OTC omeprazole, is now asking for Rx to be sent in. She has insurance now and it should be covered. Denies worsening GERD symptoms. Denies chest pain, dyspnea, dizziness, lightheadedness, syncope, near syncope. Denies any other upper or lower GI symptoms.  Past Medical History  Diagnosis Date  . Allergic rhinitis   . Anemia     leukocytosis/thrombocytosis--Dr. Tressie Stalker  . Asthma   . Depression     ? Bipolar Disorder--Dr, Jimmye Norman  . GERD (gastroesophageal reflux disease)     Barrett's esophagus  . Low back pain   .  Seizure disorder ([place])     ? absence  . Macular degeneration, bilateral     Dry-Dr. Iona Hansen  . Arthritis   . Hx of migraines   . 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 OSA (obstructive sleep apnea)   . RLS (restless legs syndrome)   . NIDDM (non-insulin dependent diabetes mellitus)   . Status post right hip replacement   . Status post rotator cuff surgery   . Closed head injury 2005    Past Surgical History  Procedure Laterality Date  . Colonoscopy with esophagogastroduodenoscopy (egd)  12/08    TCS/EGD: + Barrett's,+ gastritis, friable anal canal, hyperplastic polyp  . Appendectomy    . Vesicovaginal fistula closure w/ tah    . Rotator cuff repair      Bilaterally  . Revision total hip arthroplasty      Right  . Tonsillectomy    . Fracture surgery Left 03/07/2013    knee  . Orif tibia plateau Left 03/07/2013    Procedure: OPEN REDUCTION INTERNAL FIXATION (ORIF) TIBIAL PLATEAU;  Surgeon: Renette Butters, MD;  Location: [facility];  Service: Orthopedics;  Laterality: Left;  . Shoulder injection Left 03/07/2013    Procedure: SHOULDER INJECTION ; Depomedrol + 0.25% Marcaine mixture 4:1;  Surgeon: Renette Butters, MD;  Location: [facility];  Service: Orthopedics;  Laterality: Left;  . Esophagogastroduodenoscopy  11/16/2002    Dr.Rourk- salmon-colored epithelium in the upper esophageal sphincter previous bx= ectopic gastric mucosa, salmon colored epithelium at the distal esophagus proviously bx= barretts esophagus o/w the esophageal mucosa appeared normal. patulous esophagogastric junction, small hiatal hernia, gastric mucosa o/w appeared normal. normal  D1 and D2  . Abdominal hysterectomy    . Fx left knee Left 2014  . Colonoscopy with propofol N/A 12/27/2014    RMR: Internal hemorrhoids likely source of hematochezia. Redundant colon.   . Esophagogastroduodenoscopy (egd) with propofol N/A 12/27/2014    RMR: Abnoraml distal esophagus consistiant with prior diagnosis fo Barretts esophagus-  Status post biopsy. Hiatal hernia. Antral erosions status post gastric biopsy,  . Esophageal biopsy  12/27/2014    Procedure: Esophageal BIOPSY;  Surgeon: Daneil Dolin, MD;  Location: [facility];  Service: Endoscopy;;    Current Outpatient Prescriptions  Medication Sig Dispense Refill  . ALPRAZolam (XANAX) 1 MG tablet Take 1 tablet (1 mg total) by mouth at bedtime as needed. 30 tablet 0  . aspirin 81 MG tablet Take 81 mg by mouth daily.    [name] BIOTIN PO Take 1 capsule by mouth daily.    . Cholecalciferol (VITAMIN D3) 400 UNITS CAPS Take by mouth.      . Cinnamon 500 MG TABS Take by mouth.      . Cyanocobalamin (VITAMIN B 12 PO) Take by mouth.      . cyclobenzaprine (FLEXERIL) 10 MG tablet Take 10 mg by mouth at bedtime.    [name] desvenlafaxine (PRISTIQ) 100 MG 24 hr tablet Take 100 mg by mouth daily.      . Flaxseed, Linseed, (FLAX SEED OIL PO) Take by mouth.      . Melatonin 10 MG CAPS Take 10 mg by mouth at bedtime.    . metFORMIN (GLUCOPHAGE) 500 MG tablet Take by mouth 2 (two) times daily with a meal.    . methylphenidate (RITALIN) 10 MG tablet Take 1 tablet (10 mg total) by mouth 2 (two) times daily. 60 tablet 0  . Multiple Vitamin (MULTIVITAMIN) capsule Take 1 capsule by mouth daily.      . naproxen sodium (ANAPROX) 220 MG tablet Take 440 mg by mouth 2 (two) times daily with a meal.    . omeprazole (PRILOSEC) 20 MG capsule Take 1 capsule (20 mg total) by mouth daily. 30 capsule 2  . prazosin (MINIPRESS) 1 MG capsule Take 1 mg by mouth at bedtime.    . vitamin E 1000 UNIT capsule Take 1,000 Units by mouth daily.      . hydrocortisone (ANUSOL-HC) 2.5 % rectal cream Place 1 application rectally 2 (two) times daily. (Patient not taking: Reported on 01/11/2015) 30 g 0  . polyethylene glycol-electrolytes (TRILYTE) 420 G solution Take 4,000 mLs by mouth as directed. (Patient not taking: Reported on 01/11/2015) 4000 mL 0   No current facility-administered medications for this visit.    Allergies as  of 01/11/2015 - Review Complete 01/11/2015  Allergen Reaction Noted  . Codeine    . Morphine and related Itching 03/02/2013  . Dilaudid [hydromorphone hcl] Itching and Rash 05/23/2013  . Penicillins Rash   . Sulfonamide derivatives Rash     Family History  Problem Relation Age of Onset  . Atrial fibrillation Mother   . Diabetes Maternal Grandfather   . Cancer Mother   . Cancer Maternal Grandfather   . Thyroid disease Son     Social History   Social History  . Marital Status: Divorced    Spouse Name: N/A  . Number of Children: 3  . Years of Education: 14   Occupational History  . on disability     due to head injury, 2005   Social History Main Topics  . Smoking status: Former Smoker --  1.00 packs/day for 40 years    Types: Cigarettes    Start date: 03/03/1971    Quit date: 12/01/2012  . Smokeless tobacco: Never Used  . Alcohol Use: No  . Drug Use: No  . Sexual Activity: Yes   Other Topics Concern  . None   Social History Narrative    Review of Systems: General: Negative for anorexia, weight loss, fever, chills, fatigue, weakness. ENT: Negative for hoarseness, difficulty swallowing. CV: Negative for chest pain, angina, palpitations, peripheral edema.  Respiratory: Negative for dyspnea at rest, cough, sputum, wheezing.  GI: See history of present illness. Endo: Negative for unusual weight change.    Physical Exam: BP 151/85 mmHg  Pulse 84  Temp(Src) 97.6 F (36.4 C) (Oral)  Ht 5\' 5"  (1.651 m)  Wt 184 lb 6.4 oz (83.643 kg)  BMI 30.69 kg/m2 General:   Alert and oriented. Pleasant and cooperative. Well-nourished and well-developed.  Eyes:  Without icterus, sclera clear and conjunctiva pink.  Ears:  Normal auditory acuity. Cardiovascular:  S1, S2 present without murmurs appreciated. Extremities without clubbing or edema. Respiratory:  Clear to auscultation bilaterally. No wheezes, rales, or rhonchi. No distress.  Gastrointestinal:  +BS, soft, non-tender  and non-distended. No HSM noted. No guarding or rebound. No masses appreciated.  Rectal:  Deferred  Neurologic:  Alert and oriented x4;  grossly normal neurologically. Psych:  Alert and cooperative. Normal mood and affect.    01/11/2015 11:37 AM

## 2015-01-14 NOTE — Progress Notes (Signed)
NO PCP PER PATIENT °

## 2015-01-14 NOTE — Assessment & Plan Note (Signed)
Patient with internal hemorrhoids and one episode of toilet tissue hematochezia. Since her colonoscopy she has not had any further episodes. Insurance would not pay's for suppositories and so rectal cream was called in for symptomatically treatment as needed. She was not aware of the cream being at the pharmacy and has not used it yet. Explained use of Anusol cream, instructed to call us of hemorrhoid symptoms are not improved with cream when she does have symptoms. Also noted to call with any worsening upper GI symptoms. Return for follow-up in one year.

## 2015-01-14 NOTE — Assessment & Plan Note (Signed)
GERD symptoms generally well controlled on omeprazole over-the-counter. She is asking for to be sent to her pharmacy for insurance payment reasons. Refill has been sent. Call with any worsening or recurrent symptoms. Return for follow-up in one year.

## 2015-01-14 NOTE — Assessment & Plan Note (Signed)
Upper endoscopy up-to-date in consistent with Barrett's esophagus. We'll need routine screenings for this. She remains on omeprazole and a refill as been sent were pharmacy. Return for follow-up in one year. Notify at any upper GI symptoms before then.

## 2015-01-17 ENCOUNTER — Ambulatory Visit: Payer: Medicare HMO | Admitting: Gastroenterology

## 2015-02-06 ENCOUNTER — Encounter (INDEPENDENT_AMBULATORY_CARE_PROVIDER_SITE_OTHER): Payer: Medicare HMO | Admitting: Ophthalmology

## 2015-02-06 DIAGNOSIS — D3132 Benign neoplasm of left choroid: Secondary | ICD-10-CM

## 2015-02-06 DIAGNOSIS — H353114 Nonexudative age-related macular degeneration, right eye, advanced atrophic with subfoveal involvement: Secondary | ICD-10-CM

## 2015-02-06 DIAGNOSIS — H353221 Exudative age-related macular degeneration, left eye, with active choroidal neovascularization: Secondary | ICD-10-CM

## 2015-02-06 DIAGNOSIS — H43813 Vitreous degeneration, bilateral: Secondary | ICD-10-CM

## 2015-02-28 ENCOUNTER — Encounter (INDEPENDENT_AMBULATORY_CARE_PROVIDER_SITE_OTHER): Payer: Medicare HMO | Admitting: Ophthalmology

## 2015-02-28 DIAGNOSIS — H2513 Age-related nuclear cataract, bilateral: Secondary | ICD-10-CM

## 2015-02-28 DIAGNOSIS — H353221 Exudative age-related macular degeneration, left eye, with active choroidal neovascularization: Secondary | ICD-10-CM | POA: Diagnosis not present

## 2015-02-28 DIAGNOSIS — D3132 Benign neoplasm of left choroid: Secondary | ICD-10-CM | POA: Diagnosis not present

## 2015-02-28 DIAGNOSIS — H353114 Nonexudative age-related macular degeneration, right eye, advanced atrophic with subfoveal involvement: Secondary | ICD-10-CM | POA: Diagnosis not present

## 2015-02-28 DIAGNOSIS — H43813 Vitreous degeneration, bilateral: Secondary | ICD-10-CM

## 2015-04-04 ENCOUNTER — Encounter (INDEPENDENT_AMBULATORY_CARE_PROVIDER_SITE_OTHER): Payer: Medicare Other | Admitting: Ophthalmology

## 2015-04-04 DIAGNOSIS — H353114 Nonexudative age-related macular degeneration, right eye, advanced atrophic with subfoveal involvement: Secondary | ICD-10-CM | POA: Diagnosis not present

## 2015-04-04 DIAGNOSIS — H43813 Vitreous degeneration, bilateral: Secondary | ICD-10-CM

## 2015-04-04 DIAGNOSIS — D3132 Benign neoplasm of left choroid: Secondary | ICD-10-CM

## 2015-04-04 DIAGNOSIS — H353221 Exudative age-related macular degeneration, left eye, with active choroidal neovascularization: Secondary | ICD-10-CM

## 2015-04-24 ENCOUNTER — Other Ambulatory Visit: Payer: Self-pay | Admitting: Nurse Practitioner

## 2015-05-09 ENCOUNTER — Encounter (INDEPENDENT_AMBULATORY_CARE_PROVIDER_SITE_OTHER): Payer: Medicare Other | Admitting: Ophthalmology

## 2015-05-09 DIAGNOSIS — D3132 Benign neoplasm of left choroid: Secondary | ICD-10-CM

## 2015-05-09 DIAGNOSIS — H353221 Exudative age-related macular degeneration, left eye, with active choroidal neovascularization: Secondary | ICD-10-CM

## 2015-05-09 DIAGNOSIS — H353114 Nonexudative age-related macular degeneration, right eye, advanced atrophic with subfoveal involvement: Secondary | ICD-10-CM

## 2015-05-09 DIAGNOSIS — H43813 Vitreous degeneration, bilateral: Secondary | ICD-10-CM

## 2015-05-14 ENCOUNTER — Other Ambulatory Visit (HOSPITAL_COMMUNITY): Payer: Self-pay | Admitting: Oncology

## 2015-05-14 DIAGNOSIS — F17200 Nicotine dependence, unspecified, uncomplicated: Secondary | ICD-10-CM

## 2015-05-14 DIAGNOSIS — Z716 Tobacco abuse counseling: Secondary | ICD-10-CM | POA: Insufficient documentation

## 2015-05-14 DIAGNOSIS — E860 Dehydration: Secondary | ICD-10-CM | POA: Insufficient documentation

## 2015-05-22 ENCOUNTER — Ambulatory Visit (INDEPENDENT_AMBULATORY_CARE_PROVIDER_SITE_OTHER): Payer: Medicare Other

## 2015-05-22 ENCOUNTER — Encounter: Payer: Self-pay | Admitting: Orthopedic Surgery

## 2015-05-22 ENCOUNTER — Ambulatory Visit (INDEPENDENT_AMBULATORY_CARE_PROVIDER_SITE_OTHER): Payer: Medicare Other | Admitting: Orthopedic Surgery

## 2015-05-22 VITALS — BP 193/105 | HR 86 | Ht 65.0 in | Wt 185.0 lb

## 2015-05-22 DIAGNOSIS — M1990 Unspecified osteoarthritis, unspecified site: Secondary | ICD-10-CM | POA: Diagnosis not present

## 2015-05-22 DIAGNOSIS — M1612 Unilateral primary osteoarthritis, left hip: Secondary | ICD-10-CM

## 2015-05-22 DIAGNOSIS — M25552 Pain in left hip: Secondary | ICD-10-CM | POA: Diagnosis not present

## 2015-05-22 MED ORDER — ACETAMINOPHEN-CODEINE #3 300-30 MG PO TABS: 1.0000 | ORAL_TABLET | Freq: Four times a day (QID) | ORAL | Status: DC | PRN

## 2015-05-22 NOTE — Progress Notes (Signed)
Chief Complaint  Patient presents with  . Hip Pain    LEFT HIP PAIN   HPI 60 year old female had a right total hip by Dr. Maureen Ralphs and a left tibial plateau fracture fixed by Dr. Edmonia Lynch presents with left hip pain 4 months despite using a cane and taking Aleve. She complains of severe pain in the left groin left thigh and inability to lay on her left side difficulty walking which is become constant and interferes with her daily activities  Review of Systems  Constitutional: Negative for fever and chills.  Respiratory: Positive for shortness of breath.   Neurological: Negative for dizziness, tingling and sensory change.    Past Medical History  Diagnosis Date  . Allergic rhinitis   . Anemia     leukocytosis/thrombocytosis--Dr. Tressie Stalker  . Asthma   . Depression     ? Bipolar Disorder--Dr, Jimmye Norman  . GERD (gastroesophageal reflux disease)     Barrett's esophagus  . Low back pain   . Seizure disorder ([place])     ? absence  . Macular degeneration, bilateral     Dry-Dr. Iona Hansen  . Arthritis   . Hx of migraines   . 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 OSA (obstructive sleep apnea)   . RLS (restless legs syndrome)   . NIDDM (non-insulin dependent diabetes mellitus)   . Status post right hip replacement   . Status post rotator cuff surgery   . Closed head injury 2005    Past Surgical History  Procedure Laterality Date  . Colonoscopy with esophagogastroduodenoscopy (egd)  12/08    TCS/EGD: + Barrett's,+ gastritis, friable anal canal, hyperplastic polyp  . Appendectomy    . Vesicovaginal fistula closure w/ tah    . Rotator cuff repair      Bilaterally  . Revision total hip arthroplasty      Right  . Tonsillectomy    . Fracture surgery Left 03/07/2013    knee  . Orif tibia plateau Left 03/07/2013    Procedure: OPEN REDUCTION INTERNAL FIXATION (ORIF) TIBIAL PLATEAU;  Surgeon: Renette Butters, MD;  Location: [facility];  Service: Orthopedics;  Laterality: Left;  . Shoulder  injection Left 03/07/2013    Procedure: SHOULDER INJECTION ; Depomedrol + 0.25% Marcaine mixture 4:1;  Surgeon: Renette Butters, MD;  Location: [facility];  Service: Orthopedics;  Laterality: Left;  . Esophagogastroduodenoscopy  11/16/2002    Dr.Rourk- salmon-colored epithelium in the upper esophageal sphincter previous bx= ectopic gastric mucosa, salmon colored epithelium at the distal esophagus proviously bx= barretts esophagus o/w the esophageal mucosa appeared normal. patulous esophagogastric junction, small hiatal hernia, gastric mucosa o/w appeared normal. normal D1 and D2  . Abdominal hysterectomy    . Fx left knee Left 2014  . Colonoscopy with propofol N/A 12/27/2014    RMR: Internal hemorrhoids likely source of hematochezia. Redundant colon.   . Esophagogastroduodenoscopy (egd) with propofol N/A 12/27/2014    RMR: Abnoraml distal esophagus consistiant with prior diagnosis fo Barretts esophagus- Status post biopsy. Hiatal hernia. Antral erosions status post gastric biopsy,  . Biopsy  12/27/2014    Procedure: Esophageal BIOPSY;  Surgeon: Daneil Dolin, MD;  Location: [facility];  Service: Endoscopy;;   Family History  Problem Relation Age of Onset  . Atrial fibrillation Mother   . Diabetes Maternal Grandfather   . Cancer Mother   . Cancer Maternal Grandfather   . Thyroid disease Son    Social History  Substance Use Topics  .  Smoking status: Former Smoker -- 1.00 packs/day for 40 years    Types: Cigarettes    Start date: 03/03/1971    Quit date: 12/01/2012  . Smokeless tobacco: Never Used  . Alcohol Use: No    Current outpatient prescriptions:  .  ALPRAZolam (XANAX) 1 MG tablet, Take 1 tablet (1 mg total) by mouth at bedtime as needed., Disp: 30 tablet, Rfl: 0 .  aspirin 81 MG tablet, Take 81 mg by mouth daily., Disp: , Rfl:  .  B Complex-C (SUPER B COMPLEX PO), Take by mouth., Disp: , Rfl:  .  Besifloxacin HCl (BESIVANCE OP), Apply to eye., Disp: , Rfl:  .  beta carotene w/minerals  (OCUVITE) tablet, Take 1 tablet by mouth daily., Disp: , Rfl:  .  BIOTIN PO, Take 1 capsule by mouth daily., Disp: , Rfl:  .  Cholecalciferol (VITAMIN D3) 400 UNITS CAPS, Take by mouth.  , Disp: , Rfl:  .  cyclobenzaprine (FLEXERIL) 10 MG tablet, Take 10 mg by mouth at bedtime., Disp: , Rfl:  .  Flaxseed, Linseed, (FLAX SEED OIL PO), Take by mouth.  , Disp: , Rfl:  .  Melatonin 10 MG CAPS, Take 10 mg by mouth at bedtime., Disp: , Rfl:  .  metFORMIN (GLUCOPHAGE) 500 MG tablet, Take by mouth 2 (two) times daily with a meal., Disp: , Rfl:  .  methylphenidate (RITALIN) 10 MG tablet, Take 1 tablet (10 mg total) by mouth 2 (two) times daily., Disp: 60 tablet, Rfl: 0 .  naproxen sodium (ANAPROX) 220 MG tablet, Take 440 mg by mouth 2 (two) times daily with a meal., Disp: , Rfl:  .  omeprazole (PRILOSEC) 20 MG capsule, TAKE (1) CAPSULE BY MOUTH ONCE DAILY., Disp: 30 capsule, Rfl: 11 .  prazosin (MINIPRESS) 1 MG capsule, Take 1 mg by mouth at bedtime., Disp: , Rfl:  .  venlafaxine XR (EFFEXOR-XR) 150 MG 24 hr capsule, Take 150 mg by mouth daily with breakfast., Disp: , Rfl:  .  vitamin E 1000 UNIT capsule, Take 1,000 Units by mouth daily.  , Disp: , Rfl:   BP 193/105 mmHg  Pulse 86  Ht 5\' 5"  (1.651 m)  Wt 185 lb (83.915 kg)  BMI 30.79 kg/m2  Physical Exam  Constitutional: She is oriented to person, place, and time. She appears well-developed and well-nourished. No distress.  Cardiovascular: Normal rate and intact distal pulses.   Neurological: She is alert and oriented to person, place, and time. She has normal reflexes. She exhibits normal muscle tone. Coordination normal.  Skin: Skin is warm and dry. No rash noted. She is not diaphoretic. No erythema. No pallor.  Psychiatric: She has a normal mood and affect. Her behavior is normal. Judgment and thought content normal.    Right Hip Exam   Tenderness  The patient is experiencing no tenderness.     Range of Motion  Flexion: 110   Muscle  Strength  Abduction: 5/5   Other  Scars: present Sensation: normal Pulse: present  Comments:  Right hip incision is normal   Left Hip Exam   Tenderness  The patient is experiencing no tenderness.     Range of Motion  Flexion: 90 (Pain at 90)  Internal Rotation: 0  External Rotation: 20   Muscle Strength  Abduction: 5/5  Adduction: 5/5  Flexion: 5/5   Other  Erythema: present Scars: absent Sensation: normal Pulse: present  Comments:  Left knee incision is normal       ASSESSMENT:  My personal interpretation of the images:  Severe arthritis with severe joint space narrowing left hip    PLAN I gave her an intramuscular injection of 80 mg of Depo-Medrol  Tylenol No. 3 every 6 for pain  Arrange appointment with Dr. Ricki Rodriguez to have anterior approach total hip at her convenience  Okay to return here for injection in June  A steroid injection was performed at LEFT HIP im  using 1% plain Lidocaine and 80 mg of depomedrol. This was well tolerated.

## 2015-05-22 NOTE — Patient Instructions (Signed)
Refer to DR. Alusio

## 2015-05-30 ENCOUNTER — Telehealth: Payer: Self-pay | Admitting: *Deleted

## 2015-05-30 NOTE — Telephone Encounter (Signed)
Patient has an appointment with [facility] with Molli Barrows PA 06/12/15 at 3:30 pm Left message with appointment on voicemail

## 2015-06-05 ENCOUNTER — Ambulatory Visit (HOSPITAL_COMMUNITY): Payer: Medicare Other

## 2015-06-06 ENCOUNTER — Encounter (INDEPENDENT_AMBULATORY_CARE_PROVIDER_SITE_OTHER): Payer: Medicare Other | Admitting: Ophthalmology

## 2015-07-22 ENCOUNTER — Encounter (INDEPENDENT_AMBULATORY_CARE_PROVIDER_SITE_OTHER): Payer: Medicare Other | Admitting: Ophthalmology

## 2015-07-22 DIAGNOSIS — H353221 Exudative age-related macular degeneration, left eye, with active choroidal neovascularization: Secondary | ICD-10-CM

## 2015-07-22 DIAGNOSIS — H353114 Nonexudative age-related macular degeneration, right eye, advanced atrophic with subfoveal involvement: Secondary | ICD-10-CM | POA: Diagnosis not present

## 2015-07-22 DIAGNOSIS — H2513 Age-related nuclear cataract, bilateral: Secondary | ICD-10-CM | POA: Diagnosis not present

## 2015-07-22 DIAGNOSIS — H43813 Vitreous degeneration, bilateral: Secondary | ICD-10-CM

## 2015-08-19 ENCOUNTER — Encounter (INDEPENDENT_AMBULATORY_CARE_PROVIDER_SITE_OTHER): Payer: Medicare Other | Admitting: Ophthalmology

## 2015-08-19 DIAGNOSIS — H353221 Exudative age-related macular degeneration, left eye, with active choroidal neovascularization: Secondary | ICD-10-CM | POA: Diagnosis not present

## 2015-08-19 DIAGNOSIS — D3132 Benign neoplasm of left choroid: Secondary | ICD-10-CM | POA: Diagnosis not present

## 2015-08-19 DIAGNOSIS — H43813 Vitreous degeneration, bilateral: Secondary | ICD-10-CM | POA: Diagnosis not present

## 2015-08-19 DIAGNOSIS — H353114 Nonexudative age-related macular degeneration, right eye, advanced atrophic with subfoveal involvement: Secondary | ICD-10-CM | POA: Diagnosis not present

## 2015-09-16 ENCOUNTER — Encounter (INDEPENDENT_AMBULATORY_CARE_PROVIDER_SITE_OTHER): Payer: Medicare Other | Admitting: Ophthalmology

## 2015-10-22 ENCOUNTER — Ambulatory Visit: Payer: Self-pay | Admitting: Orthopedic Surgery

## 2015-11-13 ENCOUNTER — Inpatient Hospital Stay (HOSPITAL_COMMUNITY)
Admission: RE | Admit: 2015-11-13 | Payer: BC Managed Care – PPO | Source: Ambulatory Visit | Admitting: Orthopedic Surgery

## 2015-11-13 ENCOUNTER — Encounter (HOSPITAL_COMMUNITY): Admission: RE | Payer: Self-pay | Source: Ambulatory Visit

## 2015-11-13 SURGERY — ARTHROPLASTY, HIP, TOTAL, ANTERIOR APPROACH
Anesthesia: Choice | Site: Hip | Laterality: Left

## 2015-11-29 ENCOUNTER — Telehealth: Payer: Self-pay | Admitting: Orthopedic Surgery

## 2015-11-29 NOTE — Telephone Encounter (Signed)
Declined not seen in last 30 days

## 2015-11-29 NOTE — Telephone Encounter (Signed)
Request for refill per [facility]: acetaminophen-codeine (TYLENOL #3) 300-30 MG tablet 120 tablet 5 05/22/2015    Sig - Route: Take 1 tablet by mouth every 6 (six) hours as needed for moderate pain. - Oral     - patient's last visit was 05/22/15, at which time she was referred to Dr Wynelle Link, [facility]

## 2015-12-03 NOTE — Telephone Encounter (Signed)
Have been trying to reach patient - home ph# rings busy.  Have called cell# on file; left voice message to notify.

## 2016-05-13 ENCOUNTER — Other Ambulatory Visit: Payer: Self-pay | Admitting: Gastroenterology

## 2016-05-21 ENCOUNTER — Encounter (HOSPITAL_COMMUNITY): Payer: Self-pay | Admitting: *Deleted

## 2016-05-21 ENCOUNTER — Ambulatory Visit: Payer: Self-pay | Admitting: Orthopedic Surgery

## 2016-05-22 ENCOUNTER — Encounter: Payer: Self-pay | Admitting: Family Medicine

## 2016-05-22 ENCOUNTER — Ambulatory Visit (INDEPENDENT_AMBULATORY_CARE_PROVIDER_SITE_OTHER): Payer: Medicare Other | Admitting: Family Medicine

## 2016-05-22 VITALS — BP 158/88 | HR 88 | Temp 99.1°F | Resp 20 | Ht 65.0 in | Wt 190.1 lb

## 2016-05-22 DIAGNOSIS — Z720 Tobacco use: Secondary | ICD-10-CM | POA: Diagnosis not present

## 2016-05-22 DIAGNOSIS — I700 Atherosclerosis of aorta: Secondary | ICD-10-CM | POA: Diagnosis not present

## 2016-05-22 DIAGNOSIS — F411 Generalized anxiety disorder: Secondary | ICD-10-CM | POA: Diagnosis not present

## 2016-05-22 DIAGNOSIS — Z7689 Persons encountering health services in other specified circumstances: Secondary | ICD-10-CM

## 2016-05-22 DIAGNOSIS — J439 Emphysema, unspecified: Secondary | ICD-10-CM | POA: Insufficient documentation

## 2016-05-22 DIAGNOSIS — J432 Centrilobular emphysema: Secondary | ICD-10-CM

## 2016-05-22 DIAGNOSIS — Z01818 Encounter for other preprocedural examination: Secondary | ICD-10-CM

## 2016-05-22 LAB — CBC WITH DIFFERENTIAL/PLATELET
BASOS PCT: 0 %
Basophils Absolute: 0 cells/uL (ref 0–200)
EOS PCT: 1 %
Eosinophils Absolute: 104 cells/uL (ref 15–500)
HEMATOCRIT: 43 % (ref 35.0–45.0)
HEMOGLOBIN: 14 g/dL (ref 11.7–15.5)
LYMPHS ABS: 2600 {cells}/uL (ref 850–3900)
Lymphocytes Relative: 25 %
MCH: 30 pg (ref 27.0–33.0)
MCHC: 32.6 g/dL (ref 32.0–36.0)
MCV: 92.3 fL (ref 80.0–100.0)
MONO ABS: 936 {cells}/uL (ref 200–950)
MPV: 9.7 fL (ref 7.5–12.5)
Monocytes Relative: 9 %
NEUTROS ABS: 6760 {cells}/uL (ref 1500–7800)
Neutrophils Relative %: 65 %
Platelets: 267 10*3/uL (ref 140–400)
RBC: 4.66 MIL/uL (ref 3.80–5.10)
RDW: 13.8 % (ref 11.0–15.0)
WBC: 10.4 10*3/uL (ref 3.8–10.8)

## 2016-05-22 LAB — LIPID PANEL
Cholesterol: 195 mg/dL (ref <200)
HDL: 38 mg/dL — AB (ref >50)
LDL CALC: 128 mg/dL — AB (ref <100)
TRIGLYCERIDES: 147 mg/dL (ref <150)
Total CHOL/HDL Ratio: 5.1 Ratio — ABNORMAL HIGH (ref <5.0)
VLDL: 29 mg/dL (ref <30)

## 2016-05-22 LAB — COMPLETE METABOLIC PANEL WITH GFR
ALBUMIN: 3.7 g/dL (ref 3.6–5.1)
ALK PHOS: 67 U/L (ref 33–130)
ALT: 13 U/L (ref 6–29)
AST: 13 U/L (ref 10–35)
BUN: 27 mg/dL — ABNORMAL HIGH (ref 7–25)
CALCIUM: 9.6 mg/dL (ref 8.6–10.4)
CHLORIDE: 106 mmol/L (ref 98–110)
CO2: 26 mmol/L (ref 20–31)
Creat: 0.79 mg/dL (ref 0.50–0.99)
GFR, EST NON AFRICAN AMERICAN: 81 mL/min (ref >=60)
GFR, Est African American: >89 mL/min (ref >=60)
Glucose, Bld: 119 mg/dL — ABNORMAL HIGH (ref 65–99)
POTASSIUM: 4.7 mmol/L (ref 3.5–5.3)
Sodium: 140 mmol/L (ref 135–146)
Total Bilirubin: 0.3 mg/dL (ref 0.2–1.2)
Total Protein: 6.4 g/dL (ref 6.1–8.1)

## 2016-05-22 NOTE — Patient Instructions (Addendum)
Need to come back Monday or Tuesday for PE and EKG Lab tests today I will send you a letter with your test results OR discuss next week  STOP SMOKING

## 2016-05-22 NOTE — Progress Notes (Signed)
Chief Complaint  Patient presents with  . Establish Care   New to establish Limited old records Also requests I sign a clearance for surgery in next 2 weeks.  I cannot do without more information. I have discussed the multiple health risks associated with cigarette smoking including, but not limited to, cardiovascular disease, lung disease and cancer.  I have strongly recommended that smoking be stopped.  I have reviewed the various methods of quitting including cold [place], classes, nicotine replacements and prescription medications.  I have offered assistance in this difficult process.  The patient is not interested in assistance at this time.  She will try on her own. Is under care of a psychiatrist.  Many years.  Feels stable.  Takes effexor and xanax and ritalin. Diabetic , unknown control, Does not check sugar. Hypertension, unknown control.  BP elevated today. Is not on statin, known atherosclerosis on CT scan Has emphysema on CT.  No inhalers or dyspnea. Is on omeprazole for acid reflux Unknown mammogram - likely due Colonoscopy 2016 Refuses flu shot  Patient Active Problem List   Diagnosis Date Noted  . Emphysema of lung ([place]) 05/22/2016  . Atherosclerosis of aorta ([place]) 05/22/2016  . GAD (generalized anxiety disorder) 05/22/2016  . Hematochezia   . Mucosal abnormality of stomach   . Bipolar 1 disorder ([place]) 05/15/2014  . History of smoking 30 or more pack years 05/15/2014  . Left tibial fracture 03/02/2013  . Hematoma 03/02/2013  . Barrett's esophagus 04/03/2010  . SHOULDER, ARTHRITIS, DEGEN./OSTEO 03/06/2009  . RUPTURE ROTATOR CUFF 03/06/2009  . NECK PAIN 05/02/2008  . IMPINGEMENT SYNDROME 05/02/2008  . DIABETES MELLITUS, TYPE II, UNCONTROLLED 12/02/2007  . H N P-LUMBAR 06/29/2007  . RESTLESS LEG SYNDROME 05/18/2007  . SLEEP APNEA 05/18/2007  . Headache(784.0) 05/18/2007  . HIP, ARTHRITIS, DEGEN./OSTEO 05/16/2007  . SCIATICA 03/31/2007  . BACK PAIN 03/31/2007    . ANXIETY 03/28/2007  . DEPRESSION 03/28/2007  . MACULAR DEGENERATION 03/28/2007  . ALLERGIC RHINITIS 03/28/2007  . ASTHMA 03/28/2007  . GERD 03/28/2007  . LOW BACK PAIN 03/28/2007  . SEIZURE DISORDER 03/28/2007  . HEAD TRAUMA, CLOSED 03/28/2007    Outpatient Encounter Prescriptions as of 05/22/2016  Medication Sig  . ALPRAZolam (XANAX) 1 MG tablet Take 1 tablet (1 mg total) by mouth at bedtime as needed.  [name] aspirin 81 MG tablet Take 81 mg by mouth daily.  . B Complex-C (SUPER B COMPLEX PO) Take by mouth.  . beta carotene w/minerals (OCUVITE) tablet Take 1 tablet by mouth daily.  [name] BIOTIN PO Take 1 capsule by mouth daily.  . Cholecalciferol (VITAMIN D3) 400 UNITS CAPS Take by mouth.    . Cinnamon 500 MG capsule Take 1,000 mg by mouth daily.  . cyclobenzaprine (FLEXERIL) 10 MG tablet Take 10 mg by mouth at bedtime.  . Flaxseed, Linseed, (FLAX SEED OIL PO) Take by mouth.    . Melatonin 10 MG CAPS Take 10 mg by mouth at bedtime.  . metFORMIN (GLUCOPHAGE) 500 MG tablet Take by mouth 2 (two) times daily with a meal.  . methylphenidate (RITALIN) 10 MG tablet Take 1 tablet (10 mg total) by mouth 2 (two) times daily.  . naproxen sodium (ANAPROX) 220 MG tablet Take 440 mg by mouth 2 (two) times daily with a meal.  . Omega-3 Fatty Acids (FISH OIL) 1000 MG CAPS Take by mouth.  [name] omeprazole (PRILOSEC) 20 MG capsule TAKE (1) CAPSULE BY MOUTH ONCE DAILY.  . prazosin (MINIPRESS) 1 MG capsule  Take 1 mg by mouth at bedtime.  [name] venlafaxine XR (EFFEXOR-XR) 150 MG 24 hr capsule Take 150 mg by mouth daily with breakfast.  . vitamin E 1000 UNIT capsule Take 1,000 Units by mouth daily.     No facility-administered encounter medications on file as of 05/22/2016.     Past Medical History:  Diagnosis Date  . Allergic rhinitis   . Allergy   . Anemia    leukocytosis/thrombocytosis--Dr. Tressie Stalker  . Anxiety   . Arthritis   . Asthma   . 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 Closed head injury 2005  .  COPD (chronic obstructive pulmonary disease) ([place])   . Depression    ? Bipolar Disorder--Dr, Jimmye Norman  . Emphysema of lung ([place])   . GERD (gastroesophageal reflux disease)    Barrett's esophagus  . Hx of migraines   . Low back pain   . Macular degeneration, bilateral    Dry-Dr. Iona Hansen  . Neuromuscular disorder ([place])   . NIDDM (non-insulin dependent diabetes mellitus)   . OSA (obstructive sleep apnea)   . RLS (restless legs syndrome)   . Seizure disorder ([place])    ? absence  . Seizures ([place])   . Sleep apnea   . Status post right hip replacement   . Status post rotator cuff surgery     Past Surgical History:  Procedure Laterality Date  . ABDOMINAL HYSTERECTOMY     heavy bleeding  . APPENDECTOMY    . BIOPSY  12/27/2014   Procedure: Esophageal BIOPSY;  Surgeon: Daneil Dolin, MD;  Location: [facility];  Service: Endoscopy;;  . COLONOSCOPY WITH ESOPHAGOGASTRODUODENOSCOPY (EGD)  12/08   TCS/EGD: + Barrett's,+ gastritis, friable anal canal, hyperplastic polyp  . COLONOSCOPY WITH PROPOFOL N/A 12/27/2014   RMR: Internal hemorrhoids likely source of hematochezia. Redundant colon.   . ESOPHAGOGASTRODUODENOSCOPY  11/16/2002   Dr.Rourk- salmon-colored epithelium in the upper esophageal sphincter previous bx= ectopic gastric mucosa, salmon colored epithelium at the distal esophagus proviously bx= barretts esophagus o/w the esophageal mucosa appeared normal. patulous esophagogastric junction, small hiatal hernia, gastric mucosa o/w appeared normal. normal D1 and D2  . ESOPHAGOGASTRODUODENOSCOPY (EGD) WITH PROPOFOL N/A 12/27/2014   RMR: Abnoraml distal esophagus consistiant with prior diagnosis fo Barretts esophagus- Status post biopsy. Hiatal hernia. Antral erosions status post gastric biopsy,  . FRACTURE SURGERY Left 03/07/2013   knee  . fx left knee Left 2014  . ORIF TIBIA PLATEAU Left 03/07/2013   Procedure: OPEN REDUCTION INTERNAL FIXATION (ORIF) TIBIAL PLATEAU;  Surgeon: Renette Butters,  MD;  Location: [facility];  Service: Orthopedics;  Laterality: Left;  . REVISION TOTAL HIP ARTHROPLASTY     Right  . ROTATOR CUFF REPAIR     Bilaterally  . SHOULDER INJECTION Left 03/07/2013   Procedure: SHOULDER INJECTION ; Depomedrol + 0.25% Marcaine mixture 4:1;  Surgeon: Renette Butters, MD;  Location: [facility];  Service: Orthopedics;  Laterality: Left;  . TONSILLECTOMY    . VESICOVAGINAL FISTULA CLOSURE W/ TAH      Social History   Social History  . Marital status: Divorced    Spouse name: N/A  . Number of children: 3  . Years of education: 14   Occupational History  . on disability Unemployed    due to head injury, 2005   Social History Main Topics  . Smoking status: Current Every Day Smoker    Packs/day: 0.50    Years: 40.00    Types: Cigarettes  Start date: 03/03/1971  . Smokeless tobacco: Never Used  . Alcohol use No  . Drug use: No  . Sexual activity: Yes    Birth control/ protection: Surgical   Other Topics Concern  . Not on file   Social History Narrative   Lives alone   Two big dogs   Enjoys yard work, home improvement    Family History  Problem Relation Age of Onset  . Heart disease Father 29  . Atrial fibrillation Mother   . Cancer Mother     carcinoid  . Diabetes Maternal Grandfather   . Cancer Maternal Grandfather   . Heart disease Maternal Grandmother   . Thyroid disease Son     Review of Systems  Constitutional: Negative for malaise/fatigue and weight loss.  HENT: Negative for congestion, hearing loss and nosebleeds.   Eyes: Negative for blurred vision and redness.  Respiratory: Negative for cough and shortness of breath.   Cardiovascular: Negative for chest pain, palpitations and leg swelling.  Gastrointestinal: Positive for heartburn. Negative for constipation and diarrhea.  Genitourinary: Negative for dysuria and hematuria.  Musculoskeletal: Positive for back pain, joint pain and myalgias. Negative for falls.  Neurological: Negative  for dizziness and headaches.  Psychiatric/Behavioral: Positive for depression. The patient is nervous/anxious and has insomnia.        Better on medication    BP (!) 158/88 (BP Location: Right Arm, Patient Position: Sitting, Cuff Size: Normal)   Pulse 88   Temp 99.1 F (37.3 C) (Temporal)   Resp 20   Ht 5\' 5"  (1.651 m)   Wt 190 lb 1.9 oz (86.2 kg)   SpO2 92%   BMI 31.64 kg/m   Physical Exam  1. Centrilobular emphysema ([place])   2. Atherosclerosis of aorta ([place])   3. GAD (generalized anxiety disorder)   4. Pre-operative examination For hip replacement - COMPLETE METABOLIC PANEL WITH GFR - CBC with Differential/Platelet - Hemoglobin A1c - Hepatitis C antibody - Urinalysis, Routine w reflex microscopic - Microalbumin / creatinine urine ratio - TSH - Lipid panel - VITAMIN D 25 Hydroxy (Vit-D Deficiency, Fractures)  5. New to establish.  6. Tobacco Abuse  Patient Instructions  Need to come back Monday or Tuesday for PE and EKG Lab tests today I will send you a letter with your test results OR discuss next week  STOP SMOKING   Raylene Everts, MD

## 2016-05-23 LAB — URINALYSIS, MICROSCOPIC ONLY
Casts: NONE SEEN [LPF]
Crystals: NONE SEEN [HPF]
RBC / HPF: NONE SEEN RBC/HPF (ref <=2)
YEAST: NONE SEEN [HPF]

## 2016-05-23 LAB — MICROALBUMIN / CREATININE URINE RATIO
Creatinine, Urine: 62 mg/dL (ref 20–320)
Microalb Creat Ratio: 6 mcg/mg creat (ref <30)
Microalb, Ur: 0.4 mg/dL

## 2016-05-23 LAB — URINALYSIS, ROUTINE W REFLEX MICROSCOPIC
Bilirubin Urine: NEGATIVE
Glucose, UA: NEGATIVE
HGB URINE DIPSTICK: NEGATIVE
KETONES UR: NEGATIVE
LEUKOCYTES UA: NEGATIVE
NITRITE: POSITIVE — AB
PROTEIN: NEGATIVE
Specific Gravity, Urine: 1.018 (ref 1.001–1.035)
pH: 6 (ref 5.0–8.0)

## 2016-05-23 LAB — HEMOGLOBIN A1C
HEMOGLOBIN A1C: 6.2 % — AB (ref <5.7)
MEAN PLASMA GLUCOSE: 131 mg/dL

## 2016-05-23 LAB — TSH: TSH: 1.61 mIU/L

## 2016-05-23 LAB — VITAMIN D 25 HYDROXY (VIT D DEFICIENCY, FRACTURES): Vit D, 25-Hydroxy: 42 ng/mL (ref 30–100)

## 2016-05-23 LAB — HEPATITIS C ANTIBODY: HCV AB: NEGATIVE

## 2016-05-25 ENCOUNTER — Other Ambulatory Visit (HOSPITAL_COMMUNITY)
Admission: RE | Admit: 2016-05-25 | Discharge: 2016-05-25 | Disposition: A | Discharge status: Home / Self Care | Payer: Medicare Other | Source: Other Acute Inpatient Hospital | Attending: Family Medicine | Admitting: Family Medicine

## 2016-05-25 ENCOUNTER — Other Ambulatory Visit: Payer: Self-pay

## 2016-05-25 ENCOUNTER — Ambulatory Visit (INDEPENDENT_AMBULATORY_CARE_PROVIDER_SITE_OTHER): Payer: Medicare Other | Admitting: Family Medicine

## 2016-05-25 ENCOUNTER — Encounter: Payer: Self-pay | Admitting: Family Medicine

## 2016-05-25 VITALS — BP 112/80 | HR 84 | Temp 98.7°F | Resp 20 | Ht 65.0 in | Wt 187.1 lb

## 2016-05-25 DIAGNOSIS — I700 Atherosclerosis of aorta: Secondary | ICD-10-CM

## 2016-05-25 DIAGNOSIS — R8290 Unspecified abnormal findings in urine: Secondary | ICD-10-CM | POA: Insufficient documentation

## 2016-05-25 DIAGNOSIS — Z720 Tobacco use: Secondary | ICD-10-CM | POA: Diagnosis not present

## 2016-05-25 DIAGNOSIS — M1612 Unilateral primary osteoarthritis, left hip: Secondary | ICD-10-CM | POA: Diagnosis not present

## 2016-05-25 DIAGNOSIS — Z01818 Encounter for other preprocedural examination: Secondary | ICD-10-CM

## 2016-05-25 DIAGNOSIS — E119 Type 2 diabetes mellitus without complications: Secondary | ICD-10-CM

## 2016-05-25 DIAGNOSIS — J432 Centrilobular emphysema: Secondary | ICD-10-CM

## 2016-05-25 MED ORDER — ROSUVASTATIN CALCIUM 20 MG PO TABS: 20.0000 mg | ORAL_TABLET | Freq: Every day | ORAL | 3 refills | Status: DC

## 2016-05-25 NOTE — Progress Notes (Signed)
Chief Complaint  Patient presents with  . pre op consult   Scheduled for a total hip arthroplasty one week from today This is her second hip replacement, the last one being done without complication NO history of problems with anaesthesia, multiple surgical procedures over the years No known cardiovascular disease She has early COPD from smoking, seen on her chest CT which patient insists is asymptomatic and she does not use inhalers She can do work around the house and walk up and down stairs without dyspnea or chest pain Blood pressure is very well controlled Diabetes very well controlled with an A1c of 6.2 Lab panel is reviewed with patient.  She has hyperlipidemia, untreated, and needs to be on a statin.  She agrees to take crestor daily and check labs in 3 months.   Patient Active Problem List   Diagnosis Date Noted  . Emphysema of lung ([place]) 05/22/2016  . Atherosclerosis of aorta ([place]) 05/22/2016  . GAD (generalized anxiety disorder) 05/22/2016  . Hematochezia   . Mucosal abnormality of stomach   . Bipolar 1 disorder ([place]) 05/15/2014  . History of smoking 30 or more pack years 05/15/2014  . Left tibial fracture 03/02/2013  . Hematoma 03/02/2013  . Barrett's esophagus 04/03/2010  . SHOULDER, ARTHRITIS, DEGEN./OSTEO 03/06/2009  . RUPTURE ROTATOR CUFF 03/06/2009  . NECK PAIN 05/02/2008  . IMPINGEMENT SYNDROME 05/02/2008  . Type 2 diabetes mellitus without complications ([place]) 00/93/8182  . H N P-LUMBAR 06/29/2007  . RESTLESS LEG SYNDROME 05/18/2007  . SLEEP APNEA 05/18/2007  . Headache(784.0) 05/18/2007  . HIP, ARTHRITIS, DEGEN./OSTEO 05/16/2007  . SCIATICA 03/31/2007  . BACK PAIN 03/31/2007  . ANXIETY 03/28/2007  . DEPRESSION 03/28/2007  . MACULAR DEGENERATION 03/28/2007  . ALLERGIC RHINITIS 03/28/2007  . ASTHMA 03/28/2007  . GERD 03/28/2007  . LOW BACK PAIN 03/28/2007  . SEIZURE DISORDER 03/28/2007  . HEAD TRAUMA, CLOSED 03/28/2007    Outpatient Encounter  Prescriptions as of 05/25/2016  Medication Sig  . ALPRAZolam (XANAX) 1 MG tablet Take 1 tablet (1 mg total) by mouth at bedtime as needed.  [name] aspirin 81 MG tablet Take 81 mg by mouth daily.  . B Complex-C (SUPER B COMPLEX PO) Take by mouth.  . beta carotene w/minerals (OCUVITE) tablet Take 1 tablet by mouth daily.  [name] BIOTIN PO Take 1 capsule by mouth daily.  . Cholecalciferol (VITAMIN D3) 400 UNITS CAPS Take by mouth.    . Cinnamon 500 MG capsule Take 1,000 mg by mouth daily.  . cyclobenzaprine (FLEXERIL) 10 MG tablet Take 10 mg by mouth at bedtime.  . Flaxseed, Linseed, (FLAX SEED OIL PO) Take by mouth.    . Melatonin 10 MG CAPS Take 10 mg by mouth at bedtime.  . metFORMIN (GLUCOPHAGE) 500 MG tablet Take by mouth daily with breakfast.   . methylphenidate (RITALIN) 10 MG tablet Take 1 tablet (10 mg total) by mouth 2 (two) times daily.  . naproxen sodium (ANAPROX) 220 MG tablet Take 440 mg by mouth 2 (two) times daily with a meal.  . Omega-3 Fatty Acids (FISH OIL) 1000 MG CAPS Take by mouth.  [name] omeprazole (PRILOSEC) 20 MG capsule TAKE (1) CAPSULE BY MOUTH ONCE DAILY.  . prazosin (MINIPRESS) 1 MG capsule Take 1 mg by mouth at bedtime.  [name] venlafaxine XR (EFFEXOR-XR) 150 MG 24 hr capsule Take 150 mg by mouth daily with breakfast.  . vitamin E 1000 UNIT capsule Take 1,000 Units by mouth daily.    . rosuvastatin (CRESTOR)  20 MG tablet Take 1 tablet (20 mg total) by mouth daily.   No facility-administered encounter medications on file as of 05/25/2016.     Past Medical History:  Diagnosis Date  . Allergic rhinitis   . Allergy   . Anemia    leukocytosis/thrombocytosis--Dr. Tressie Stalker  . Anxiety   . Arthritis   . Asthma   . 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 Closed head injury 2005  . COPD (chronic obstructive pulmonary disease) ([place])   . Depression    ? Bipolar Disorder--Dr, Jimmye Norman  . Emphysema of lung ([place])   . GERD (gastroesophageal reflux disease)    Barrett's esophagus  . Hx  of migraines   . Low back pain   . Macular degeneration, bilateral    Dry-Dr. Iona Hansen  . Neuromuscular disorder ([place])   . NIDDM (non-insulin dependent diabetes mellitus)   . OSA (obstructive sleep apnea)   . RLS (restless legs syndrome)   . Seizure disorder ([place])    ? absence  . Seizures ([place])   . Sleep apnea   . Status post right hip replacement   . Status post rotator cuff surgery     Past Surgical History:  Procedure Laterality Date  . ABDOMINAL HYSTERECTOMY     heavy bleeding  . APPENDECTOMY    . BIOPSY  12/27/2014   Procedure: Esophageal BIOPSY;  Surgeon: Daneil Dolin, MD;  Location: [facility];  Service: Endoscopy;;  . COLONOSCOPY WITH ESOPHAGOGASTRODUODENOSCOPY (EGD)  12/08   TCS/EGD: + Barrett's,+ gastritis, friable anal canal, hyperplastic polyp  . COLONOSCOPY WITH PROPOFOL N/A 12/27/2014   RMR: Internal hemorrhoids likely source of hematochezia. Redundant colon.   . ESOPHAGOGASTRODUODENOSCOPY  11/16/2002   Dr.Rourk- salmon-colored epithelium in the upper esophageal sphincter previous bx= ectopic gastric mucosa, salmon colored epithelium at the distal esophagus proviously bx= barretts esophagus o/w the esophageal mucosa appeared normal. patulous esophagogastric junction, small hiatal hernia, gastric mucosa o/w appeared normal. normal D1 and D2  . ESOPHAGOGASTRODUODENOSCOPY (EGD) WITH PROPOFOL N/A 12/27/2014   RMR: Abnoraml distal esophagus consistiant with prior diagnosis fo Barretts esophagus- Status post biopsy. Hiatal hernia. Antral erosions status post gastric biopsy,  . FRACTURE SURGERY Left 03/07/2013   knee  . fx left knee Left 2014  . ORIF TIBIA PLATEAU Left 03/07/2013   Procedure: OPEN REDUCTION INTERNAL FIXATION (ORIF) TIBIAL PLATEAU;  Surgeon: Renette Butters, MD;  Location: [facility];  Service: Orthopedics;  Laterality: Left;  . REVISION TOTAL HIP ARTHROPLASTY     Right  . ROTATOR CUFF REPAIR     Bilaterally  . SHOULDER INJECTION Left 03/07/2013   Procedure:  SHOULDER INJECTION ; Depomedrol + 0.25% Marcaine mixture 4:1;  Surgeon: Renette Butters, MD;  Location: [facility];  Service: Orthopedics;  Laterality: Left;  . TONSILLECTOMY    . VESICOVAGINAL FISTULA CLOSURE W/ TAH      Social History   Social History  . Marital status: Divorced    Spouse name: N/A  . Number of children: 3  . Years of education: 14   Occupational History  . on disability Unemployed    due to head injury, 2005   Social History Main Topics  . Smoking status: Current Every Day Smoker    Packs/day: 0.50    Years: 40.00    Types: Cigarettes    Start date: 03/03/1971  . Smokeless tobacco: Never Used  . Alcohol use No  . Drug use: No  . Sexual activity: Yes    Birth  control/ protection: Surgical   Other Topics Concern  . Not on file   Social History Narrative   Lives alone   Two big dogs   Enjoys yard work, home improvement    Family History  Problem Relation Age of Onset  . Heart disease Father 28  . Atrial fibrillation Mother   . Cancer Mother     carcinoid  . Diabetes Maternal Grandfather   . Cancer Maternal Grandfather   . Heart disease Maternal Grandmother   . Thyroid disease Son   Results for CRAIG, WISNEWSKI (MRN [id number]) as of 05/25/2016 13:51  Ref. Range 05/22/2016 14:07  Sodium Latest Ref Range: 135 - 146 mmol/L 140  Potassium Latest Ref Range: 3.5 - 5.3 mmol/L 4.7  Chloride Latest Ref Range: 98 - 110 mmol/L 106  CO2 Latest Ref Range: 20 - 31 mmol/L 26  Glucose Latest Ref Range: 65 - 99 mg/dL 119 (H)  Mean Plasma Glucose Latest Units: mg/dL 131  BUN Latest Ref Range: 7 - 25 mg/dL 27 (H)  Creatinine Latest Ref Range: 0.50 - 0.99 mg/dL 0.79  Calcium Latest Ref Range: 8.6 - 10.4 mg/dL 9.6  Alkaline Phosphatase Latest Ref Range: 33 - 130 U/L 67  Albumin Latest Ref Range: 3.6 - 5.1 g/dL 3.7  AST Latest Ref Range: 10 - 35 U/L 13  ALT Latest Ref Range: 6 - 29 U/L 13  Total Protein Latest Ref Range: 6.1 - 8.1 g/dL 6.4  Total Bilirubin Latest  Ref Range: 0.2 - 1.2 mg/dL 0.3  GFR, Est African American Latest Ref Range: >=60 mL/min >89  GFR, Est Non African American Latest Ref Range: >=60 mL/min 81  Total CHOL/HDL Ratio Latest Ref Range: <5.0 Ratio 5.1 (H)  Cholesterol Latest Ref Range: <200 mg/dL 195  HDL Cholesterol Latest Ref Range: >50 mg/dL 38 (L)  LDL (calc) Latest Ref Range: <100 mg/dL 128 (H)  MICROALB/CREAT RATIO Latest Ref Range: <30 mcg/mg creat 6  Triglycerides Latest Ref Range: <150 mg/dL 147  VLDL Latest Ref Range: <30 mg/dL 29  Vitamin D, 25-Hydroxy Latest Ref Range: 30 - 100 ng/mL 42  WBC Latest Ref Range: 3.8 - 10.8 K/uL 10.4  RBC Latest Ref Range: 3.80 - 5.10 MIL/uL 4.66  Hemoglobin Latest Ref Range: 11.7 - 15.5 g/dL 14.0  HCT Latest Ref Range: 35.0 - 45.0 % 43.0  MCV Latest Ref Range: 80.0 - 100.0 fL 92.3  MCH Latest Ref Range: 27.0 - 33.0 pg 30.0  MCHC Latest Ref Range: 32.0 - 36.0 g/dL 32.6  RDW Latest Ref Range: 11.0 - 15.0 % 13.8  Platelets Latest Ref Range: 140 - 400 K/uL 267  MPV Latest Ref Range: 7.5 - 12.5 fL 9.7  Neutrophils Latest Units: % 65  Lymphocytes Latest Units: % 25  Monocytes Relative Latest Units: % 9  Eosinophil Latest Units: % 1  Basophil Latest Units: % 0  NEUT# Latest Ref Range: 1,500 - 7,800 cells/uL 6,760  Lymphocyte # Latest Ref Range: 850 - 3,900 cells/uL 2,600  Monocyte # Latest Ref Range: 200 - 950 cells/uL 936  Eosinophils Absolute Latest Ref Range: 15 - 500 cells/uL 104  Basophils Absolute Latest Ref Range: 0 - 200 cells/uL 0  Smear Review Unknown Criteria for revi...  Hemoglobin A1C Latest Ref Range: <5.7 % 6.2 (H)  TSH Latest Units: mIU/L 1.61  HCV Ab Latest Ref Range: NEGATIVE  NEGATIVE  URINALYSIS, ROUTINE W REFLEX MICROSCOPIC Unknown Rpt (A)  Appearance Latest Ref Range: CLEAR  CLEAR  Bacteria, UA Latest  Ref Range: NONE SEEN HPF FEW (A)  Bilirubin Urine Latest Ref Range: NEGATIVE  NEGATIVE  Casts Latest Ref Range: NONE SEEN LPF NONE SEEN  Color, Urine Latest Ref  Range: YELLOW  YELLOW  Crystals Latest Ref Range: NONE SEEN HPF NONE SEEN  Glucose Latest Ref Range: NEGATIVE  NEGATIVE  Hgb urine dipstick Latest Ref Range: NEGATIVE  NEGATIVE  Ketones, ur Latest Ref Range: NEGATIVE  NEGATIVE  Leukocytes, UA Latest Ref Range: NEGATIVE  NEGATIVE  Nitrite Latest Ref Range: NEGATIVE  POSITIVE (A)Urine culture pending  pH Latest Ref Range: 5.0 - 8.0  6.0  Protein Latest Ref Range: NEGATIVE  NEGATIVE  RBC / HPF Latest Ref Range: <=2 RBC/HPF NONE SEEN  Specific Gravity, Urine Latest Ref Range: 1.001 - 1.035  1.018  Squamous Epithelial / LPF Latest Ref Range: <=5 HPF 0-5  WBC, UA Latest Ref Range: <=5 WBC/HPF 0-5  Microalb, Ur Latest Ref Range: Not estab mg/dL 0.4  Creatinine, Urine Latest Ref Range: 20 - 320 mg/dL 62    Review of Systems  Constitutional: Negative for chills, fever, malaise/fatigue and weight loss.  HENT: Positive for hearing loss. Negative for congestion and nosebleeds.        Denies dental problems  Eyes: Negative for blurred vision, double vision and redness.  Respiratory: Negative for cough, hemoptysis and sputum production.   Cardiovascular: Negative for chest pain, palpitations, orthopnea, claudication and leg swelling.  Gastrointestinal: Negative for heartburn, nausea and vomiting.       On medication  Genitourinary: Negative for dysuria.  Musculoskeletal: Positive for back pain and joint pain. Negative for falls.       Pain in hip and low back.  General osteoarthritis  Neurological: Negative for dizziness and headaches.  Psychiatric/Behavioral: Negative for depression and substance abuse. The patient is not nervous/anxious and does not have insomnia.        Sees specialist regularly, feels stable   BP 112/80 (BP Location: Right Arm, Patient Position: Sitting, Cuff Size: Normal)   Pulse 84   Temp 98.7 F (37.1 C) (Temporal)   Resp 20   Ht 5\' 5"  (1.651 m)   Wt 187 lb 1.9 oz (84.9 kg)   SpO2 95%   BMI 31.14 kg/m   Physical  Exam  Constitutional: She is oriented to person, place, and time. She appears well-developed and well-nourished. No distress.  HENT:  Head: Normocephalic and atraumatic.  Right Ear: External ear normal.  Left Ear: External ear normal.  Nose: Nose normal.  Mouth/Throat: Oropharynx is clear and moist.  Dentition well restored.  Mallampati class III  Eyes: Conjunctivae and EOM are normal. Pupils are equal, round, and reactive to light.  Neck: Normal range of motion. No thyromegaly present.  Cardiovascular: Normal rate, regular rhythm, normal heart sounds and intact distal pulses.   Pulmonary/Chest: Effort normal and breath sounds normal. She has no wheezes. She has no rales.  Diminished lung sounds  Abdominal: Soft. Bowel sounds are normal. There is no tenderness.  NO hepatosplenomegaly   Musculoskeletal: Normal range of motion. She exhibits no edema.  Lymphadenopathy:    She has no cervical adenopathy.  Neurological: She is alert and oriented to person, place, and time. She displays normal reflexes.  Psychiatric: She has a normal mood and affect. Her behavior is normal. Thought content normal.  EKG= NORMAL  ASSESSMENT/PLAN:   1. Primary osteoarthritis of left hip   2. Pre-operative examination   3. Centrilobular emphysema ([place])   4. Atherosclerosis of aorta ([place])  -  Lipid panel  5. Tobacco abuse   6. Type 2 diabetes mellitus without complication, without long-term current use of insulin ([facility])  - Lipid panel - Hemoglobin A1c  7. Abnormal urine finding  - Urine Culture   Patient Instructions  Reduce the metformin to once a day with breakfast Add rosuvastatin for high cholesterol.  Take once a day in the evening  Try to STOP SMOKING  No medical problems identified that would place you at increased risk from the upcoming surgery  If you can reduce or stop the flexeril at night ( cyclobenzaprine) it will help your dry mouth  Need to have lab testing and  followup with me in 3 months   Raylene Everts, MD

## 2016-05-25 NOTE — Patient Instructions (Addendum)
Reduce the metformin to once a day with breakfast Add rosuvastatin for high cholesterol.  Take once a day in the evening  Try to STOP SMOKING  No medical problems identified that would place you at increased risk from the upcoming surgery  If you can reduce or stop the flexeril at night ( cyclobenzaprine) it will help your dry mouth  Need to have lab testing and followup with me in 3 months

## 2016-05-27 ENCOUNTER — Telehealth: Payer: Self-pay

## 2016-05-27 DIAGNOSIS — N390 Urinary tract infection, site not specified: Secondary | ICD-10-CM

## 2016-05-27 LAB — URINE CULTURE: Culture: >=100000 — AB

## 2016-05-27 MED ORDER — CIPROFLOXACIN HCL 500 MG PO TABS: 500.0000 mg | ORAL_TABLET | Freq: Two times a day (BID) | ORAL | 0 refills | Status: AC

## 2016-05-27 NOTE — Telephone Encounter (Signed)
Left message to call office

## 2016-05-27 NOTE — Telephone Encounter (Signed)
-----   Message from Raylene Everts, MD sent at 05/26/2016  4:44 PM EDT ----- Please call patient. She has a UTI .  Needs 7 d of cipro 500 BID.  thanks

## 2016-05-27 NOTE — Patient Instructions (Addendum)
Adrienne [name]  05/27/2016   Your procedure is scheduled on: Monday, 06/01/16  Report to [facility] Main  Entrance and report to Admitting at 1250  PM.  Call this number if you have problems the morning of surgery [phone]   Remember: ONLY 1 PERSON MAY GO WITH YOU TO SHORT STAY TO GET  READY MORNING OF [place].                          How to Manage Your Diabetes Before and After Surgery  Why is it important to control my blood sugar before and after surgery? . Improving blood sugar levels before and after surgery helps healing and can limit problems. . A way of improving blood sugar control is eating a healthy diet by: o  Eating less sugar and carbohydrates o  Increasing activity/exercise o  Talking with your doctor about reaching your blood sugar goals . High blood sugars (greater than 180 mg/dL) can raise your risk of infections and slow your recovery, so you will need to focus on controlling your diabetes during the weeks before surgery. . Make sure that the doctor who takes care of your diabetes knows about your planned surgery including the date and location.  How do I manage my blood sugar before surgery? . Check your blood sugar at least 4 times a day, starting 2 days before surgery, to make sure that the level is not too high or low. o Check your blood sugar the morning of your surgery when you wake up and every 2 hours until you get to the Short Stay unit. . If your blood sugar is less than 70 mg/dL, you will need to treat for low blood sugar: o Do not take insulin. o Treat a low blood sugar (less than 70 mg/dL) with  cup of clear juice (cranberry or apple), 4 glucose tablets, OR glucose gel. o Recheck blood sugar in 15 minutes after treatment (to make sure it is greater than 70 mg/dL). If your blood sugar is not greater than 70 mg/dL on recheck, call [phone] for further instructions. . Report your blood sugar to the short stay nurse when  you get to Short Stay.  . If you are admitted to the hospital after surgery: o Your blood sugar will be checked by the staff and you will probably be given insulin after surgery (instead of oral diabetes medicines) to make sure you have good blood sugar levels. o The goal for blood sugar control after surgery is 80-180 mg/dL.   WHAT DO I DO ABOUT MY DIABETES MEDICATION?       Take Metformin the day before surgery as usual!  . Do not take Metformin  diabetes medicines (pills) the morning of surgery.    Donot eat food after Midnight.  You may have a clear liquid diet from Midnight to 850 AM.   Nothing after 0850 am!   CLEAR LIQUID DIET   Foods Allowed                                                                     Foods Excluded  Coffee and tea, regular and decaf                             liquids that you cannot  Plain Jell-O in any flavor                                             see through such as: Fruit ices (not with fruit pulp)                                     milk, soups, orange juice  Iced Popsicles                                    All solid food Carbonated beverages, regular and diet                                    Cranberry, grape and apple juices Sports drinks like Gatorade Lightly seasoned clear broth or consume(fat free) Sugar, honey syrup  Sample Menu Breakfast                                Lunch                                     Supper Cranberry juice                    Beef broth                            Chicken broth Jell-O                                     Grape juice                           Apple juice Coffee or tea                        Jell-O                                      Popsicle                                                Coffee or tea                        Coffee or tea  _____________________________________________________________________     Take these medicines the morning of surgery with A SIP OF WATER:  Omeprazole (Prilosec), Venlafaxine XR (Effexor-XR), Rosuvatatin (Crestor), Alprazolam (Xanax)  You may not have any metal on your body including hair pins and              piercings  Do not wear jewelry, make-up, lotions, powders or perfumes, deodorant             Do not wear nail polish.  Do not shave  48 hours prior to surgery.              Men may shave face and neck.   Do not bring valuables to the hospital. [place].  Contacts, dentures or bridgework may not be worn into surgery.  Leave suitcase in the car. After surgery it may be brought to your room.                  Please read over the following fact sheets you were given: _____________________________________________________________________  [facility] - Preparing for Surgery Before surgery, you can play an important role.  Because skin is not sterile, your skin needs to be as free of germs as possible.  You can reduce the number of germs on your skin by washing with CHG (chlorahexidine gluconate) soap before surgery.  CHG is an antiseptic cleaner which kills germs and bonds with the skin to continue killing germs even after washing. Please DO NOT use if you have an allergy to CHG or antibacterial soaps.  If your skin becomes reddened/irritated stop using the CHG and inform your nurse when you arrive at Short Stay. Do not shave (including legs and underarms) for at least 48 hours prior to the first CHG shower.  You may shave your face/neck. Please follow these instructions carefully:  1.  Shower with CHG Soap the night before surgery and the  morning of Surgery.  2.  If you choose to wash your hair, wash your hair first as usual with your  normal  shampoo.  3.  After you shampoo, rinse your hair and body thoroughly to remove the  shampoo.                           4.  Use CHG as you would any other liquid soap.  You can apply chg directly  to the  skin and wash                       Gently with a scrungie or clean washcloth.  5.  Apply the CHG Soap to your body ONLY FROM THE NECK DOWN.   Do not use on face/ open                           Wound or open sores. Avoid contact with eyes, ears mouth and genitals (private parts).                       Wash face,  Genitals (private parts) with your normal soap.             6.  Wash thoroughly, paying special attention to the area where your surgery  will be performed.  7.  Thoroughly rinse your body with warm water from the neck down.  8.  DO NOT shower/wash with your normal soap after using and rinsing off  the CHG Soap.  9.  Pat yourself dry with a clean towel.            10.  Wear clean pajamas.            11.  Place clean sheets on your bed the night of your first shower and do not  sleep with pets. Day of Surgery : Do not apply any lotions/deodorants the morning of surgery.  Please wear clean clothes to the hospital/surgery center.  FAILURE TO FOLLOW THESE INSTRUCTIONS MAY RESULT IN THE CANCELLATION OF YOUR SURGERY PATIENT SIGNATURE_________________________________  NURSE SIGNATURE__________________________________  ________________________________________________________________________   Adam Phenix  An incentive spirometer is a tool that can help keep your lungs clear and active. This tool measures how well you are filling your lungs with each breath. Taking long deep breaths may help reverse or decrease the chance of developing breathing (pulmonary) problems (especially infection) following:  A long period of time when you are unable to move or be active. BEFORE THE PROCEDURE   If the spirometer includes an indicator to show your best effort, your nurse or respiratory therapist will set it to a desired goal.  If possible, sit up straight or lean slightly forward. Try not to slouch.  Hold the incentive spirometer in an upright position. INSTRUCTIONS FOR  USE  1. Sit on the edge of your bed if possible, or sit up as far as you can in bed or on a chair. 2. Hold the incentive spirometer in an upright position. 3. Breathe out normally. 4. Place the mouthpiece in your mouth and seal your lips tightly around it. 5. Breathe in slowly and as deeply as possible, raising the piston or the ball toward the top of the column. 6. Hold your breath for 3-5 seconds or for as long as possible. Allow the piston or ball to fall to the bottom of the column. 7. Remove the mouthpiece from your mouth and breathe out normally. 8. Rest for a few seconds and repeat Steps 1 through 7 at least 10 times every 1-2 hours when you are awake. Take your time and take a few normal breaths between deep breaths. 9. The spirometer may include an indicator to show your best effort. Use the indicator as a goal to work toward during each repetition. 10. After each set of 10 deep breaths, practice coughing to be sure your lungs are clear. If you have an incision (the cut made at the time of surgery), support your incision when coughing by placing a pillow or rolled up towels firmly against it. Once you are able to get out of bed, walk around indoors and cough well. You may stop using the incentive spirometer when instructed by your caregiver.  RISKS AND COMPLICATIONS  Take your time so you do not get dizzy or light-headed.  If you are in pain, you may need to take or ask for pain medication before doing incentive spirometry. It is harder to take a deep breath if you are having pain. AFTER USE  Rest and breathe slowly and easily.  It can be helpful to keep track of a log of your progress. Your caregiver can provide you with a simple table to help with this. If you are using the spirometer at home, follow these instructions: [place] IF:   You are having difficultly using the spirometer.  You have trouble using the spirometer as often as instructed.  Your pain medication  is not giving enough relief while using the spirometer.  You  develop fever of 100.5 F (38.1 C) or higher. SEEK IMMEDIATE MEDICAL CARE IF:   You cough up bloody sputum that had not been present before.  You develop fever of 102 F (38.9 C) or greater.  You develop worsening pain at or near the incision site. MAKE SURE YOU:   Understand these instructions.  Will watch your condition.  Will get help right away if you are not doing well or get worse. Document Released: 07/06/2006 Document Revised: 05/18/2011 Document Reviewed: 09/06/2006 Exit[employer] Patient Information 2014 Exit[employer], [place].   ________________________________________________________________________  WHAT IS A BLOOD TRANSFUSION? Blood Transfusion Information  A transfusion is the replacement of blood or some of its parts. Blood is made up of multiple cells which provide different functions.  Red blood cells carry oxygen and are used for blood loss replacement.  White blood cells fight against infection.  Platelets control bleeding.  Plasma helps clot blood.  Other blood products are available for specialized needs, such as hemophilia or other clotting disorders. BEFORE THE TRANSFUSION  Who gives blood for transfusions?   Healthy volunteers who are fully evaluated to make sure their blood is safe. This is blood bank blood. Transfusion therapy is the safest it has ever been in the practice of medicine. Before blood is taken from a donor, a complete history is taken to make sure that person has no history of diseases nor engages in risky social behavior (examples are intravenous drug use or sexual activity with multiple partners). The donor's travel history is screened to minimize risk of transmitting infections, such as malaria. The donated blood is tested for signs of infectious diseases, such as HIV and hepatitis. The blood is then tested to be sure it is compatible with you in order to minimize the chance of a  transfusion reaction. If you or a relative donates blood, this is often done in anticipation of surgery and is not appropriate for emergency situations. It takes many days to process the donated blood. RISKS AND COMPLICATIONS Although transfusion therapy is very safe and saves many lives, the main dangers of transfusion include:   Getting an infectious disease.  Developing a transfusion reaction. This is an allergic reaction to something in the blood you were given. Every precaution is taken to prevent this. The decision to have a blood transfusion has been considered carefully by your caregiver before blood is given. Blood is not given unless the benefits outweigh the risks. AFTER THE TRANSFUSION  Right after receiving a blood transfusion, you will usually feel much better and more energetic. This is especially true if your red blood cells have gotten low (anemic). The transfusion raises the level of the red blood cells which carry oxygen, and this usually causes an energy increase.  The nurse administering the transfusion will monitor you carefully for complications. HOME CARE INSTRUCTIONS  No special instructions are needed after a transfusion. You may find your energy is better. Speak with your caregiver about any limitations on activity for underlying diseases you may have. SEEK MEDICAL CARE IF:   Your condition is not improving after your transfusion.  You develop redness or irritation at the intravenous (IV) site. SEEK IMMEDIATE MEDICAL CARE IF:  Any of the following symptoms occur over the next 12 hours:  Shaking chills.  You have a temperature by mouth above 102 F (38.9 C), not controlled by medicine.  Chest, back, or muscle pain.  People around you feel you are not acting correctly or are confused.  Shortness of breath  or difficulty breathing.  Dizziness and fainting.  You get a rash or develop hives.  You have a decrease in urine output.  Your urine turns a dark  color or changes to pink, red, or brown. Any of the following symptoms occur over the next 10 days:  You have a temperature by mouth above 102 F (38.9 C), not controlled by medicine.  Shortness of breath.  Weakness after normal activity.  The white part of the eye turns yellow (jaundice).  You have a decrease in the amount of urine or are urinating less often.  Your urine turns a dark color or changes to pink, red, or brown. Document Released: 02/21/2000 Document Revised: 05/18/2011 Document Reviewed: 10/10/2007 [facility] Patient Information 2014 [place], [place].  _______________________________________________________________________

## 2016-05-27 NOTE — Telephone Encounter (Signed)
-----   Message from Raylene Everts, MD sent at 05/27/2016 12:12 PM EDT ----- Make sure Loletha Carrow is on her cipro (message sent yesterday)

## 2016-05-28 ENCOUNTER — Encounter (HOSPITAL_COMMUNITY): Payer: Self-pay

## 2016-05-28 ENCOUNTER — Encounter (HOSPITAL_COMMUNITY)
Admission: RE | Admit: 2016-05-28 | Discharge: 2016-05-28 | Disposition: A | Discharge status: Home / Self Care | Payer: Medicare Other | Source: Ambulatory Visit | Attending: Orthopedic Surgery | Admitting: Orthopedic Surgery

## 2016-05-28 DIAGNOSIS — M1612 Unilateral primary osteoarthritis, left hip: Secondary | ICD-10-CM | POA: Diagnosis not present

## 2016-05-28 DIAGNOSIS — Z01812 Encounter for preprocedural laboratory examination: Secondary | ICD-10-CM | POA: Diagnosis not present

## 2016-05-28 HISTORY — DX: Headache, unspecified: R51.9

## 2016-05-28 HISTORY — DX: Headache: R51

## 2016-05-28 HISTORY — DX: Essential (primary) hypertension: I10

## 2016-05-28 LAB — COMPREHENSIVE METABOLIC PANEL
ALT: 16 U/L (ref 14–54)
AST: 19 U/L (ref 15–41)
Albumin: 4 g/dL (ref 3.5–5.0)
Alkaline Phosphatase: 67 U/L (ref 38–126)
Anion gap: 8 (ref 5–15)
BUN: 19 mg/dL (ref 6–20)
CHLORIDE: 106 mmol/L (ref 101–111)
CO2: 24 mmol/L (ref 22–32)
Calcium: 9.2 mg/dL (ref 8.9–10.3)
Creatinine, Ser: 0.74 mg/dL (ref 0.44–1.00)
GFR calc Af Amer: >60 mL/min (ref >60)
Glucose, Bld: 189 mg/dL — ABNORMAL HIGH (ref 65–99)
POTASSIUM: 4.2 mmol/L (ref 3.5–5.1)
SODIUM: 138 mmol/L (ref 135–145)
Total Bilirubin: 0.6 mg/dL (ref 0.3–1.2)
Total Protein: 7.2 g/dL (ref 6.5–8.1)

## 2016-05-28 LAB — APTT: APTT: 28 s (ref 24–36)

## 2016-05-28 LAB — GLUCOSE, CAPILLARY: GLUCOSE-CAPILLARY: 197 mg/dL — AB (ref 65–99)

## 2016-05-28 LAB — PROTIME-INR
INR: 0.93
PROTHROMBIN TIME: 12.5 s (ref 11.4–15.2)

## 2016-05-28 LAB — SURGICAL PCR SCREEN
MRSA, PCR: NEGATIVE
Staphylococcus aureus: NEGATIVE

## 2016-05-29 ENCOUNTER — Ambulatory Visit: Payer: Self-pay | Admitting: Orthopedic Surgery

## 2016-05-29 NOTE — Progress Notes (Signed)
Pt aware of surgical time change for Monday 06/01/2016; pt aware to arrive at [place] at 1:45 pm. Pt verbalized understanding of no food after midnight but can have clear liquid diet from midnight till 9:45 am day of surgery then nothing by mouth.

## 2016-05-29 NOTE — H&P (Signed)
Adrienne [name] DOB: 12/28/1955 Divorced / Language: English / Race: White Female Date of Admission:  06/01/2016 CC: Left Hip Pain History of Present Illness The patient is a 61 year old female who comes in for a preoperative History and Physical. The patient is scheduled for a left total hip arthroplasty (anterior) to be performed by Dr. Dione Plover. Aluisio, MD at [facility] on 06-01-2016. The patient is a 61 year old female who presented for their hip. The patient is being followed for their left hip pain and osteoarthritis. They are month(s) out from when symptoms began. Symptoms reported include: pain (constant groin pain/sharp.), instability and pain with weightbearing. The patient feels that they are doing poorly and report their pain level to be moderate to severe. The following medication has been used for pain control: Tylenol with codeine (by Dr. Aline Brochure: medication is not helping.). The patient has not gotten any relief of their symptoms with Cortisone injections (did not help). Her left hip has gotten extremely painful in the past six months or so. It is hurting in the groin. It radiates down to the anterior thigh. This is worse than the pain she had in her right hip before we replaced that 7 about years ago. She is doing great in regards to the right. The left hip is limiting what she can and cannot do. She is readsy to proceed with surgical fixation. They have been treated conservatively in the past for the above stated problem and despite conservative measures, they continue to have progressive pain and severe functional limitations and dysfunction. They have failed non-operative management including home exercise, medications. It is felt that they would benefit from undergoing total joint replacement. Risks and benefits of the procedure have been discussed with the patient and they elect to proceed with surgery. There are no active contraindications to surgery such as ongoing infection  or rapidly progressive neurological disease.  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 Cerebrovascular Accident  Maternal Grandmother. Diabetes Mellitus  Maternal Grandfather. First Degree Relatives  reported Heart Disease  Father, Maternal Grandmother, Mother. Heart disease in female family member before age 8   Social History Children  3 Current work status  disabled Exercise  Exercises daily; does other Former drinker  06/12/2015: In the past drank Living situation  live alone Marital status  divorced No history of drug/alcohol rehab  Not under pain contract  Number of flights of stairs before winded  greater than 5 Tobacco / smoke exposure  06/12/2015: yes outdoors only Tobacco use  Current some day smoker. 06/12/2015: smoke(d) less than 1/2 pack(s) per day  Medication History ALPRAZolam (1MG  Tablet, Oral) Active. Aspirin (81MG  Tablet Chewable, Oral) Active. Beta Carotene with Minerals Active. Biotin Active. Cinnamon Active. Cyclobenzaprine HCl (10MG  Tablet, Oral) Active. Fish Oil Active. Flax Seed Oil Active. Melatonin (10MG  Capsule, Oral) Active. MetFORMIN HCl (500MG  Tablet, Oral) Active. Methylphenidate HCl (20MG  Tablet, Oral) Active. Naproxen Active. Omeprazole (20MG  Capsule DR, Oral) Active. Prazosin HCl (1MG  Capsule, Oral) Active. Crestor Active. Super B  Complex  Active. Venlafaxine HCl ER (150MG  Capsule ER 24HR, Oral) Active. Vitaimin D3 Active. Vitamin E (200UNIT Capsule, 1 (one) Oral) Active.  Past Surgical History Appendectomy  Arthroscopy of Knee  Date: 2014. left Hysterectomy  partial (non-cancerous) Rotator Cuff Repair  bilateral Tonsillectomy  Total Hip Replacement  Date: 2010. right  Review of Systems General Not Present- Chills, Fatigue, Fever, Memory Loss, Night Sweats, Weight Gain and Weight Loss. Skin Not Present- Eczema, Hives, Itching, Lesions and Rash. HEENT Present- Hearing Loss. Not Present- Dentures, Double Vision, Headache, Tinnitus and Visual Loss. Respiratory Not Present- Allergies, Chronic Cough, Coughing up blood, Shortness of breath at rest and Shortness of breath with exertion. Cardiovascular Not Present- Chest Pain, Difficulty Breathing Lying Down, Murmur, Palpitations, Racing/skipping heartbeats and Swelling. Gastrointestinal Not Present- Abdominal Pain, Bloody Stool, Constipation, Diarrhea, Difficulty Swallowing, Heartburn, Jaundice, Loss of appetitie, Nausea and Vomiting. Female Genitourinary Not Present- Blood in Urine, Discharge, Flank Pain, Incontinence, Painful Urination, Urgency, Urinary frequency, Urinary Retention, Urinating at Night and Weak urinary stream. Musculoskeletal Present- Back Pain, Joint Pain, Morning Stiffness and Spasms. Not Present- Joint Swelling, Muscle Pain and Muscle Weakness. Neurological Not Present- Blackout spells, Difficulty with balance, Dizziness, Paralysis, Tremor and Weakness. Psychiatric Not Present- Insomnia.  Vitals  Weight: 187 lb Height: 65in Weight was reported by patient. Height was reported by patient. Body Surface Area: 1.92 m Body Mass Index: 31.12 kg/m  Pulse: 76 (Regular)  BP: 142/78 (Sitting, Left Arm, Standard)  Physical Exam  General Mental Status -Alert, cooperative and good historian. General Appearance-pleasant, Not in acute  distress. Orientation-Oriented X3. Build & Nutrition-Well nourished and Well developed.  Head and Neck Head-normocephalic, atraumatic . Neck Global Assessment - supple, no bruit auscultated on the right, no bruit auscultated on the left.  Eye Pupil - Bilateral-Regular and Round. Motion - Bilateral-EOMI.  Chest and Lung Exam Auscultation Breath sounds - clear at anterior chest wall and clear at posterior chest wall. Adventitious sounds - Inspiratory wheeze - Right Lower Lobe (Anterior)(clears with coughing) and Right Middle Lobe (Anterior).  Cardiovascular Auscultation Rhythm - Regular rate and rhythm. Heart Sounds - S1 WNL and S2 WNL. Murmurs & Other Heart Sounds - Auscultation of the heart reveals - No Murmurs.  Abdomen Palpation/Percussion Tenderness - Abdomen is non-tender to palpation. Rigidity (guarding) - Abdomen is soft. Auscultation Auscultation of the abdomen reveals - Bowel sounds normal.  Female Genitourinary Note: Not done, not pertinent to present illness   Musculoskeletal Note: On exam, she is in no distress. Right hip can be flexed to 120, rotated in 30, out 40, abduct to 40 without discomfort. Left hip flexion to 90. No internal rotation, 5 degrees of external rotation and about 5 to 10 degrees of abduction.  RADIOGRAPHS Review show the prosthesis on the right in excellent position with no periprosthetic abnormalities. On the left, she has bone on bone arthritis in that hip with the some osteophyte formation.  Assessment & Plan Primary osteoarthritis of left hip (M16.12)  Note:Surgical Plans: Left Total Hip Replacement - Anterior Approach  Disposition: Hme with Family  PCP: Dr. Meda Coffee  IV TXA  Anesthesia Issues: None excpet for a Hypotension episode once  Patient was instructed on what medications to stop prior to surgery.  Signed electronically by Joelene Millin, III PA-C

## 2016-06-01 ENCOUNTER — Inpatient Hospital Stay (HOSPITAL_COMMUNITY): Payer: Medicare Other

## 2016-06-01 ENCOUNTER — Inpatient Hospital Stay (HOSPITAL_COMMUNITY): Payer: Medicare Other | Admitting: Anesthesiology

## 2016-06-01 ENCOUNTER — Inpatient Hospital Stay (HOSPITAL_COMMUNITY)
Admission: RE | Admit: 2016-06-01 | Discharge: 2016-06-02 | DRG: 470 | Disposition: A | Discharge status: Home Health | Payer: Medicare Other | Source: Ambulatory Visit | Attending: Orthopedic Surgery | Admitting: Orthopedic Surgery

## 2016-06-01 ENCOUNTER — Encounter (HOSPITAL_COMMUNITY)
Admission: RE | Disposition: A | Discharge status: Home Health | Payer: Self-pay | Source: Ambulatory Visit | Attending: Orthopedic Surgery

## 2016-06-01 ENCOUNTER — Encounter (HOSPITAL_COMMUNITY): Payer: Self-pay | Admitting: Anesthesiology

## 2016-06-01 DIAGNOSIS — M1612 Unilateral primary osteoarthritis, left hip: Secondary | ICD-10-CM | POA: Diagnosis present

## 2016-06-01 DIAGNOSIS — Z7982 Long term (current) use of aspirin: Secondary | ICD-10-CM

## 2016-06-01 DIAGNOSIS — Z87820 Personal history of traumatic brain injury: Secondary | ICD-10-CM

## 2016-06-01 DIAGNOSIS — Z7984 Long term (current) use of oral hypoglycemic drugs: Secondary | ICD-10-CM | POA: Diagnosis not present

## 2016-06-01 DIAGNOSIS — I739 Peripheral vascular disease, unspecified: Secondary | ICD-10-CM | POA: Diagnosis present

## 2016-06-01 DIAGNOSIS — Z823 Family history of stroke: Secondary | ICD-10-CM

## 2016-06-01 DIAGNOSIS — Z96641 Presence of right artificial hip joint: Secondary | ICD-10-CM | POA: Diagnosis present

## 2016-06-01 DIAGNOSIS — Z96649 Presence of unspecified artificial hip joint: Secondary | ICD-10-CM

## 2016-06-01 DIAGNOSIS — J449 Chronic obstructive pulmonary disease, unspecified: Secondary | ICD-10-CM | POA: Diagnosis present

## 2016-06-01 DIAGNOSIS — E669 Obesity, unspecified: Secondary | ICD-10-CM | POA: Diagnosis present

## 2016-06-01 DIAGNOSIS — E118 Type 2 diabetes mellitus with unspecified complications: Secondary | ICD-10-CM | POA: Diagnosis present

## 2016-06-01 DIAGNOSIS — F329 Major depressive disorder, single episode, unspecified: Secondary | ICD-10-CM | POA: Diagnosis present

## 2016-06-01 DIAGNOSIS — G4730 Sleep apnea, unspecified: Secondary | ICD-10-CM | POA: Diagnosis present

## 2016-06-01 DIAGNOSIS — Z79899 Other long term (current) drug therapy: Secondary | ICD-10-CM | POA: Diagnosis not present

## 2016-06-01 DIAGNOSIS — Z6830 Body mass index (BMI) 30.0-30.9, adult: Secondary | ICD-10-CM | POA: Diagnosis not present

## 2016-06-01 DIAGNOSIS — K219 Gastro-esophageal reflux disease without esophagitis: Secondary | ICD-10-CM | POA: Diagnosis present

## 2016-06-01 DIAGNOSIS — F419 Anxiety disorder, unspecified: Secondary | ICD-10-CM | POA: Diagnosis present

## 2016-06-01 DIAGNOSIS — H3530 Unspecified macular degeneration: Secondary | ICD-10-CM | POA: Diagnosis present

## 2016-06-01 DIAGNOSIS — E7800 Pure hypercholesterolemia, unspecified: Secondary | ICD-10-CM | POA: Diagnosis present

## 2016-06-01 DIAGNOSIS — Z880 Allergy status to penicillin: Secondary | ICD-10-CM

## 2016-06-01 DIAGNOSIS — F17210 Nicotine dependence, cigarettes, uncomplicated: Secondary | ICD-10-CM | POA: Diagnosis present

## 2016-06-01 DIAGNOSIS — Z885 Allergy status to narcotic agent status: Secondary | ICD-10-CM | POA: Diagnosis not present

## 2016-06-01 DIAGNOSIS — I10 Essential (primary) hypertension: Secondary | ICD-10-CM | POA: Diagnosis present

## 2016-06-01 DIAGNOSIS — Z8249 Family history of ischemic heart disease and other diseases of the circulatory system: Secondary | ICD-10-CM | POA: Diagnosis not present

## 2016-06-01 DIAGNOSIS — M169 Osteoarthritis of hip, unspecified: Secondary | ICD-10-CM | POA: Diagnosis present

## 2016-06-01 HISTORY — PX: TOTAL HIP ARTHROPLASTY: SHX124

## 2016-06-01 LAB — TYPE AND SCREEN
ABO/RH(D): O POS
ANTIBODY SCREEN: NEGATIVE

## 2016-06-01 LAB — GLUCOSE, CAPILLARY
GLUCOSE-CAPILLARY: 150 mg/dL — AB (ref 65–99)
GLUCOSE-CAPILLARY: 157 mg/dL — AB (ref 65–99)

## 2016-06-01 SURGERY — ARTHROPLASTY, HIP, TOTAL, ANTERIOR APPROACH
Anesthesia: Spinal | Site: Hip | Laterality: Left

## 2016-06-01 MED ORDER — MIDAZOLAM HCL 2 MG/2ML IJ SOLN
INTRAMUSCULAR | Status: AC
  Filled 2016-06-01: qty 2

## 2016-06-01 MED ORDER — MEPERIDINE HCL 50 MG/ML IJ SOLN: 6.2500 mg | INTRAMUSCULAR | Status: DC | PRN

## 2016-06-01 MED ORDER — PHENOL 1.4 % MT LIQD
1.0000 | OROMUCOSAL | Status: DC | PRN
Start: 2016-06-01 — End: 2016-06-02

## 2016-06-01 MED ORDER — DOCUSATE SODIUM 100 MG PO CAPS
100.0000 mg | ORAL_CAPSULE | Freq: Two times a day (BID) | ORAL | Status: DC
  Administered 2016-06-01 – 2016-06-02 (×2): 100 mg via ORAL
  Filled 2016-06-01 (×2): qty 1

## 2016-06-01 MED ORDER — METHOCARBAMOL 500 MG PO TABS
500.0000 mg | ORAL_TABLET | Freq: Four times a day (QID) | ORAL | Status: DC | PRN
  Administered 2016-06-02: 10:00:00 500 mg via ORAL
  Filled 2016-06-01: qty 1

## 2016-06-01 MED ORDER — METOCLOPRAMIDE HCL 5 MG/ML IJ SOLN: 5.0000 mg | Freq: Three times a day (TID) | INTRAMUSCULAR | Status: DC | PRN

## 2016-06-01 MED ORDER — ROCURONIUM BROMIDE 50 MG/5ML IV SOSY
PREFILLED_SYRINGE | INTRAVENOUS | Status: AC
  Filled 2016-06-01: qty 10

## 2016-06-01 MED ORDER — ACETAMINOPHEN 500 MG PO TABS
1000.0000 mg | ORAL_TABLET | Freq: Four times a day (QID) | ORAL | Status: DC
  Administered 2016-06-01 – 2016-06-02 (×3): 1000 mg via ORAL
  Filled 2016-06-01 (×3): qty 2

## 2016-06-01 MED ORDER — ACETAMINOPHEN 325 MG PO TABS: 650.0000 mg | ORAL_TABLET | Freq: Four times a day (QID) | ORAL | Status: DC | PRN

## 2016-06-01 MED ORDER — LIDOCAINE 2% (20 MG/ML) 5 ML SYRINGE
INTRAMUSCULAR | Status: AC
  Filled 2016-06-01: qty 5

## 2016-06-01 MED ORDER — FENTANYL CITRATE (PF) 100 MCG/2ML IJ SOLN
INTRAMUSCULAR | Status: DC | PRN
  Administered 2016-06-01: 50 ug via INTRAVENOUS
  Administered 2016-06-01: 25 ug via INTRAVENOUS
  Administered 2016-06-01: 50 ug via INTRAVENOUS
  Administered 2016-06-01: 100 ug via INTRAVENOUS

## 2016-06-01 MED ORDER — ONDANSETRON HCL 4 MG/2ML IJ SOLN: 4.0000 mg | Freq: Four times a day (QID) | INTRAMUSCULAR | Status: DC | PRN

## 2016-06-01 MED ORDER — HYDROMORPHONE HCL 1 MG/ML IJ SOLN
0.2500 mg | INTRAMUSCULAR | Status: DC | PRN
  Administered 2016-06-01: 0.25 mg via INTRAVENOUS
  Administered 2016-06-01: 0.5 mg via INTRAVENOUS
  Administered 2016-06-01 (×2): 0.25 mg via INTRAVENOUS

## 2016-06-01 MED ORDER — HYDROMORPHONE HCL 1 MG/ML IJ SOLN
INTRAMUSCULAR | Status: AC
  Administered 2016-06-01: 0.25 mg via INTRAVENOUS
  Filled 2016-06-01: qty 0.5

## 2016-06-01 MED ORDER — OXYCODONE HCL 5 MG PO TABS
5.0000 mg | ORAL_TABLET | ORAL | Status: DC | PRN
  Administered 2016-06-01 – 2016-06-02 (×4): 10 mg via ORAL
  Filled 2016-06-01 (×4): qty 2

## 2016-06-01 MED ORDER — CEFAZOLIN SODIUM-DEXTROSE 2-4 GM/100ML-% IV SOLN
INTRAVENOUS | Status: AC
  Filled 2016-06-01: qty 100

## 2016-06-01 MED ORDER — PRAZOSIN HCL 1 MG PO CAPS
1.0000 mg | ORAL_CAPSULE | Freq: Every day | ORAL | Status: DC
  Administered 2016-06-01: 1 mg via ORAL
  Filled 2016-06-01: qty 1

## 2016-06-01 MED ORDER — SUGAMMADEX SODIUM 200 MG/2ML IV SOLN
INTRAVENOUS | Status: DC | PRN
  Administered 2016-06-01: 200 mg via INTRAVENOUS

## 2016-06-01 MED ORDER — RIVAROXABAN 10 MG PO TABS
10.0000 mg | ORAL_TABLET | Freq: Every day | ORAL | Status: DC
  Administered 2016-06-02: 09:00:00 10 mg via ORAL
  Filled 2016-06-01: qty 1

## 2016-06-01 MED ORDER — VENLAFAXINE HCL ER 150 MG PO CP24
150.0000 mg | ORAL_CAPSULE | Freq: Every day | ORAL | Status: DC
  Administered 2016-06-02: 150 mg via ORAL
  Filled 2016-06-01: qty 1

## 2016-06-01 MED ORDER — GLYCOPYRROLATE 0.2 MG/ML IJ SOLN
INTRAMUSCULAR | Status: DC | PRN
  Administered 2016-06-01: 0.2 mg via INTRAVENOUS

## 2016-06-01 MED ORDER — LIDOCAINE HCL (CARDIAC) 20 MG/ML IV SOLN
INTRAVENOUS | Status: DC | PRN
  Administered 2016-06-01: 50 mg via INTRAVENOUS

## 2016-06-01 MED ORDER — GLYCOPYRROLATE 0.2 MG/ML IV SOSY
PREFILLED_SYRINGE | INTRAVENOUS | Status: AC
  Filled 2016-06-01: qty 5

## 2016-06-01 MED ORDER — FLEET ENEMA 7-19 GM/118ML RE ENEM: 1.0000 | ENEMA | Freq: Once | RECTAL | Status: DC | PRN

## 2016-06-01 MED ORDER — ACETAMINOPHEN 10 MG/ML IV SOLN
1000.0000 mg | Freq: Once | INTRAVENOUS | Status: AC
  Administered 2016-06-01: 1000 mg via INTRAVENOUS

## 2016-06-01 MED ORDER — ONDANSETRON HCL 4 MG PO TABS: 4.0000 mg | ORAL_TABLET | Freq: Four times a day (QID) | ORAL | Status: DC | PRN

## 2016-06-01 MED ORDER — CEFAZOLIN SODIUM-DEXTROSE 2-4 GM/100ML-% IV SOLN
2.0000 g | Freq: Four times a day (QID) | INTRAVENOUS | Status: AC
  Administered 2016-06-01 – 2016-06-02 (×2): 2 g via INTRAVENOUS
  Filled 2016-06-01 (×2): qty 100

## 2016-06-01 MED ORDER — MIDAZOLAM HCL 5 MG/5ML IJ SOLN
INTRAMUSCULAR | Status: DC | PRN
  Administered 2016-06-01: 2 mg via INTRAVENOUS

## 2016-06-01 MED ORDER — BUPIVACAINE HCL (PF) 0.25 % IJ SOLN
INTRAMUSCULAR | Status: DC | PRN
Start: 2016-06-01 — End: 2016-06-01
  Administered 2016-06-01: 30 mL

## 2016-06-01 MED ORDER — TRANEXAMIC ACID 1000 MG/10ML IV SOLN
1000.0000 mg | Freq: Once | INTRAVENOUS | Status: AC
  Administered 2016-06-01: 20:00:00 1000 mg via INTRAVENOUS
  Filled 2016-06-01: qty 1100

## 2016-06-01 MED ORDER — BUPIVACAINE HCL (PF) 0.25 % IJ SOLN
INTRAMUSCULAR | Status: AC
  Filled 2016-06-01: qty 30

## 2016-06-01 MED ORDER — CHLORHEXIDINE GLUCONATE 4 % EX LIQD: 60.0000 mL | Freq: Once | CUTANEOUS | Status: DC

## 2016-06-01 MED ORDER — METHYLPHENIDATE HCL 10 MG PO TABS
20.0000 mg | ORAL_TABLET | ORAL | Status: DC
  Administered 2016-06-02 (×2): 20 mg via ORAL
  Filled 2016-06-01 (×2): qty 2

## 2016-06-01 MED ORDER — ONDANSETRON HCL 4 MG/2ML IJ SOLN
INTRAMUSCULAR | Status: AC
  Filled 2016-06-01: qty 2

## 2016-06-01 MED ORDER — ROSUVASTATIN CALCIUM 20 MG PO TABS
20.0000 mg | ORAL_TABLET | Freq: Two times a day (BID) | ORAL | Status: DC
  Administered 2016-06-01 – 2016-06-02 (×2): 20 mg via ORAL
  Filled 2016-06-01 (×2): qty 1

## 2016-06-01 MED ORDER — ROCURONIUM BROMIDE 100 MG/10ML IV SOLN
INTRAVENOUS | Status: DC | PRN
  Administered 2016-06-01: 50 mg via INTRAVENOUS
  Administered 2016-06-01: 10 mg via INTRAVENOUS

## 2016-06-01 MED ORDER — PROMETHAZINE HCL 25 MG/ML IJ SOLN: 6.2500 mg | INTRAMUSCULAR | Status: DC | PRN

## 2016-06-01 MED ORDER — ALPRAZOLAM 1 MG PO TABS
1.0000 mg | ORAL_TABLET | Freq: Every evening | ORAL | Status: DC | PRN
  Administered 2016-06-01: 1 mg via ORAL
  Filled 2016-06-01: qty 1

## 2016-06-01 MED ORDER — BISACODYL 10 MG RE SUPP: 10.0000 mg | Freq: Every day | RECTAL | Status: DC | PRN

## 2016-06-01 MED ORDER — PROPOFOL 10 MG/ML IV BOLUS
INTRAVENOUS | Status: AC
  Filled 2016-06-01: qty 20

## 2016-06-01 MED ORDER — METHOCARBAMOL 1000 MG/10ML IJ SOLN
500.0000 mg | Freq: Four times a day (QID) | INTRAVENOUS | Status: DC | PRN
  Administered 2016-06-01: 500 mg via INTRAVENOUS
  Filled 2016-06-01: qty 5
  Filled 2016-06-01: qty 550

## 2016-06-01 MED ORDER — DEXAMETHASONE SODIUM PHOSPHATE 10 MG/ML IJ SOLN
INTRAMUSCULAR | Status: AC
  Filled 2016-06-01: qty 1

## 2016-06-01 MED ORDER — PROPOFOL 10 MG/ML IV BOLUS
INTRAVENOUS | Status: DC | PRN
  Administered 2016-06-01: 160 mg via INTRAVENOUS

## 2016-06-01 MED ORDER — SODIUM CHLORIDE 0.9 % IV SOLN: INTRAVENOUS | Status: DC

## 2016-06-01 MED ORDER — ACETAMINOPHEN 650 MG RE SUPP: 650.0000 mg | Freq: Four times a day (QID) | RECTAL | Status: DC | PRN

## 2016-06-01 MED ORDER — DIPHENHYDRAMINE HCL 12.5 MG/5ML PO ELIX: 12.5000 mg | ORAL_SOLUTION | ORAL | Status: DC | PRN

## 2016-06-01 MED ORDER — SODIUM CHLORIDE 0.9 % IV SOLN
1000.0000 mg | INTRAVENOUS | Status: AC
  Administered 2016-06-01: 1000 mg via INTRAVENOUS
  Filled 2016-06-01: qty 1100

## 2016-06-01 MED ORDER — CIPROFLOXACIN HCL 500 MG PO TABS
500.0000 mg | ORAL_TABLET | Freq: Two times a day (BID) | ORAL | Status: DC
  Administered 2016-06-01 – 2016-06-02 (×2): 500 mg via ORAL
  Filled 2016-06-01 (×2): qty 1

## 2016-06-01 MED ORDER — ACETAMINOPHEN 10 MG/ML IV SOLN
INTRAVENOUS | Status: AC
  Filled 2016-06-01: qty 100

## 2016-06-01 MED ORDER — DEXAMETHASONE SODIUM PHOSPHATE 10 MG/ML IJ SOLN
10.0000 mg | Freq: Once | INTRAMUSCULAR | Status: AC
  Administered 2016-06-01: 10 mg via INTRAVENOUS

## 2016-06-01 MED ORDER — PHENYLEPHRINE HCL 10 MG/ML IJ SOLN
INTRAMUSCULAR | Status: DC | PRN
  Administered 2016-06-01: 80 ug via INTRAVENOUS
  Administered 2016-06-01: 40 ug via INTRAVENOUS

## 2016-06-01 MED ORDER — METOCLOPRAMIDE HCL 5 MG PO TABS
5.0000 mg | ORAL_TABLET | Freq: Three times a day (TID) | ORAL | Status: DC | PRN
Start: 2016-06-01 — End: 2016-06-02

## 2016-06-01 MED ORDER — PANTOPRAZOLE SODIUM 40 MG PO TBEC: 40.0000 mg | DELAYED_RELEASE_TABLET | Freq: Every day | ORAL | Status: DC

## 2016-06-01 MED ORDER — POLYETHYLENE GLYCOL 3350 17 G PO PACK
17.0000 g | PACK | Freq: Every day | ORAL | Status: DC | PRN
Start: 2016-06-01 — End: 2016-06-02

## 2016-06-01 MED ORDER — DEXAMETHASONE SODIUM PHOSPHATE 10 MG/ML IJ SOLN
10.0000 mg | Freq: Once | INTRAMUSCULAR | Status: AC
  Administered 2016-06-02: 10 mg via INTRAVENOUS
  Filled 2016-06-01: qty 1

## 2016-06-01 MED ORDER — EPHEDRINE SULFATE 50 MG/ML IJ SOLN
INTRAMUSCULAR | Status: DC | PRN
  Administered 2016-06-01: 5 mg via INTRAVENOUS
  Administered 2016-06-01: 10 mg via INTRAVENOUS

## 2016-06-01 MED ORDER — LACTATED RINGERS IV SOLN
INTRAVENOUS | Status: DC
  Administered 2016-06-01 (×2): via INTRAVENOUS

## 2016-06-01 MED ORDER — CEFAZOLIN SODIUM-DEXTROSE 2-4 GM/100ML-% IV SOLN
2.0000 g | INTRAVENOUS | Status: AC
  Administered 2016-06-01: 2 g via INTRAVENOUS

## 2016-06-01 MED ORDER — FENTANYL CITRATE (PF) 250 MCG/5ML IJ SOLN
INTRAMUSCULAR | Status: AC
  Filled 2016-06-01: qty 5

## 2016-06-01 MED ORDER — DIPHENHYDRAMINE HCL 50 MG/ML IJ SOLN: 12.5000 mg | INTRAMUSCULAR | Status: DC | PRN

## 2016-06-01 MED ORDER — ONDANSETRON HCL 4 MG/2ML IJ SOLN
INTRAMUSCULAR | Status: DC | PRN
Start: 2016-06-01 — End: 2016-06-01
  Administered 2016-06-01: 4 mg via INTRAVENOUS

## 2016-06-01 MED ORDER — MENTHOL 3 MG MT LOZG: 1.0000 | LOZENGE | OROMUCOSAL | Status: DC | PRN

## 2016-06-01 MED ORDER — INSULIN ASPART 100 UNIT/ML ~~LOC~~ SOLN
0.0000 [IU] | Freq: Three times a day (TID) | SUBCUTANEOUS | Status: DC
  Administered 2016-06-02 (×2): 3 [IU] via SUBCUTANEOUS

## 2016-06-01 MED ORDER — HYDROMORPHONE HCL 1 MG/ML IJ SOLN
INTRAMUSCULAR | Status: AC
  Administered 2016-06-01: 0.25 mg via INTRAVENOUS
  Filled 2016-06-01: qty 1

## 2016-06-01 SURGICAL SUPPLY — 37 items
BAG DECANTER FOR FLEXI CONT (MISCELLANEOUS) ×3 IMPLANT
BAG SPEC THK2 15X12 ZIP CLS (MISCELLANEOUS)
BAG ZIPLOCK 12X15 (MISCELLANEOUS) IMPLANT
BLADE SAG 18X100X1.27 (BLADE) ×3 IMPLANT
CAPT HIP TOTAL 2 ×2 IMPLANT
CLOSURE WOUND 1/2 X4 (GAUZE/BANDAGES/DRESSINGS) ×1
CLOTH BEACON ORANGE TIMEOUT ST (SAFETY) ×3 IMPLANT
COVER PERINEAL POST (MISCELLANEOUS) ×3 IMPLANT
DECANTER SPIKE VIAL GLASS SM (MISCELLANEOUS) ×3 IMPLANT
DRAPE STERI IOBAN 125X83 (DRAPES) ×3 IMPLANT
DRAPE U-SHAPE 47X51 STRL (DRAPES) ×6 IMPLANT
DRSG ADAPTIC 3X8 NADH LF (GAUZE/BANDAGES/DRESSINGS) ×3 IMPLANT
DRSG MEPILEX BORDER 4X4 (GAUZE/BANDAGES/DRESSINGS) ×3 IMPLANT
DRSG MEPILEX BORDER 4X8 (GAUZE/BANDAGES/DRESSINGS) ×3 IMPLANT
DURAPREP 26ML APPLICATOR (WOUND CARE) ×3 IMPLANT
ELECT REM PT RETURN 15FT ADLT (MISCELLANEOUS) ×3 IMPLANT
EVACUATOR 1/8 PVC DRAIN (DRAIN) ×3 IMPLANT
GLOVE BIO SURGEON STRL SZ7.5 (GLOVE) ×3 IMPLANT
GLOVE BIO SURGEON STRL SZ8 (GLOVE) ×6 IMPLANT
GLOVE BIOGEL PI IND STRL 8 (GLOVE) ×2 IMPLANT
GLOVE BIOGEL PI INDICATOR 8 (GLOVE) ×4
GOWN STRL REUS W/TWL LRG LVL3 (GOWN DISPOSABLE) ×3 IMPLANT
GOWN STRL REUS W/TWL XL LVL3 (GOWN DISPOSABLE) ×3 IMPLANT
NS IRRIG 1000ML POUR BTL (IV SOLUTION) ×2 IMPLANT
PACK ANTERIOR HIP CUSTOM (KITS) ×3 IMPLANT
STRIP CLOSURE SKIN 1/2X4 (GAUZE/BANDAGES/DRESSINGS) ×2 IMPLANT
SUT ETHIBOND NAB CT1 #1 30IN (SUTURE) ×3 IMPLANT
SUT MNCRL AB 4-0 PS2 18 (SUTURE) ×3 IMPLANT
SUT STRATAFIX 0 PDS 27 VIOLET (SUTURE) ×3
SUT VIC AB 2-0 CT1 27 (SUTURE) ×6
SUT VIC AB 2-0 CT1 TAPERPNT 27 (SUTURE) ×2 IMPLANT
SUTURE STRATFX 0 PDS 27 VIOLET (SUTURE) ×1 IMPLANT
SYR 50ML LL SCALE MARK (SYRINGE) IMPLANT
TRAY FOLEY W/METER SILVER 14FR (SET/KITS/TRAYS/PACK) ×2 IMPLANT
TRAY FOLEY W/METER SILVER 16FR (SET/KITS/TRAYS/PACK) ×1 IMPLANT
WATER STERILE IRR 1000ML POUR (IV SOLUTION) ×4 IMPLANT
YANKAUER SUCT BULB TIP 10FT TU (MISCELLANEOUS) ×3 IMPLANT

## 2016-06-01 NOTE — Transfer of Care (Signed)
Immediate Anesthesia Transfer of Care Note  Patient: Adrienne [name]  Procedure(s) Performed: Procedure(s): LEFT TOTAL HIP ARTHROPLASTY ANTERIOR APPROACH (Left)  Patient Location: PACU  Anesthesia Type:General  Level of Consciousness: awake, alert  and oriented  Airway & Oxygen Therapy: Patient Spontanous Breathing and Patient connected to face mask oxygen  Post-op Assessment: Report given to RN and Post -op Vital signs reviewed and stable  Post vital signs: Reviewed and stable  Last Vitals:  Vitals:   06/01/16 1710 06/01/16 1715  BP: (!) 157/81   Pulse: 81 78  Resp: 16 18  Temp:      Last Pain:  Vitals:   06/01/16 1353  TempSrc:   PainSc: 7          Complications: No apparent anesthesia complications

## 2016-06-01 NOTE — Interval H&P Note (Signed)
History and Physical Interval Note:  06/01/2016 2:09 PM  Adrienne [name]  has presented today for surgery, with the diagnosis of Left hip osteoarthritis   The various methods of treatment have been discussed with the patient and family. After consideration of risks, benefits and other options for treatment, the patient has consented to  Procedure(s): LEFT TOTAL HIP ARTHROPLASTY ANTERIOR APPROACH (Left) as a surgical intervention .  The patient's history has been reviewed, patient examined, no change in status, stable for surgery.  I have reviewed the patient's chart and labs.  Questions were answered to the patient's satisfaction.     [name]

## 2016-06-01 NOTE — Transfer of Care (Signed)
Immediate Anesthesia Transfer of Care Note  Patient: Adrienne [name]  Procedure(s) Performed: Procedure(s): LEFT TOTAL HIP ARTHROPLASTY ANTERIOR APPROACH (Left)  Patient Location: PACU  Anesthesia Type:General  Level of Consciousness: awake, alert , oriented and patient cooperative  Airway & Oxygen Therapy: Patient Spontanous Breathing and Patient connected to face mask oxygen  Post-op Assessment: Report given to RN and Post -op Vital signs reviewed and stable  Post vital signs: Reviewed and stable  Last Vitals:  Vitals:   06/01/16 1343  BP: 120/75  Pulse: 69  Resp: 18  Temp: 37 C    Last Pain:  Vitals:   06/01/16 1353  TempSrc:   PainSc: 7          Complications: No apparent anesthesia complications

## 2016-06-01 NOTE — Anesthesia Postprocedure Evaluation (Signed)
Anesthesia Post Note  Patient: Adrienne [name]  Procedure(s) Performed: Procedure(s) (LRB): LEFT TOTAL HIP ARTHROPLASTY ANTERIOR APPROACH (Left)  Patient location during evaluation: PACU Anesthesia Type: Spinal Level of consciousness: awake and alert and oriented Pain management: pain level controlled Vital Signs Assessment: post-procedure vital signs reviewed and stable Respiratory status: spontaneous breathing, nonlabored ventilation, respiratory function stable and patient connected to nasal cannula oxygen Cardiovascular status: blood pressure returned to baseline and stable Postop Assessment: no signs of nausea or vomiting Anesthetic complications: no       Last Vitals:  Vitals:   06/01/16 1800 06/01/16 1815  BP: 106/65   Pulse: 69 67  Resp: 12 13  Temp:  36.7 C    Last Pain:  Vitals:   06/01/16 1800  TempSrc:   PainSc: 9                  Benigna Delisi A.

## 2016-06-01 NOTE — Anesthesia Preprocedure Evaluation (Addendum)
Anesthesia Evaluation    Airway Mallampati: II  TM Distance: >3 FB Neck ROM: Full    Dental no notable dental hx. (+) Teeth Intact   Pulmonary asthma , sleep apnea , COPD, Current Smoker,    Pulmonary exam normal breath sounds clear to auscultation       Cardiovascular hypertension, + Peripheral Vascular Disease  Normal cardiovascular exam Rhythm:Regular Rate:Normal - Systolic murmurs    Neuro/Psych  Headaches, Seizures -, Well Controlled,  PSYCHIATRIC DISORDERS Anxiety Depression Bipolar Disorder Absence seizures Restless legs syndrome Hx/o traumatic brain injury Macular degeneration-bilaterally  Neuromuscular disease    GI/Hepatic Neg liver ROS, GERD  Medicated and Controlled,  Endo/Other  diabetes, Well Controlled, Type 2, Oral Hypoglycemic AgentsHyperlipidemia Obesity  Renal/GU negative Renal ROS  negative genitourinary   Musculoskeletal  (+) Arthritis , Osteoarthritis,  OA left knee   Abdominal (+) + obese,   Peds  Hematology  (+) anemia ,   Anesthesia Other Findings   Reproductive/Obstetrics                             Anesthesia Physical Anesthesia Plan  ASA: III  Anesthesia Plan: General   Post-op Pain Management:    Induction: Intravenous  Airway Management Planned: Oral ETT  Additional Equipment:   Intra-op Plan:   Post-operative Plan: Extubation in OR  Informed Consent: I have reviewed the patients History and Physical, chart, labs and discussed the procedure including the risks, benefits and alternatives for the proposed anesthesia with the patient or authorized representative who has indicated his/her understanding and acceptance.   Dental advisory given  Plan Discussed with: CRNA, Anesthesiologist and Surgeon  Anesthesia Plan Comments:        Anesthesia Quick Evaluation

## 2016-06-01 NOTE — Transfer of Care (Signed)
Immediate Anesthesia Transfer of Care Note  Patient: Adrienne [name]  Procedure(s) Performed: Procedure(s): LEFT TOTAL HIP ARTHROPLASTY ANTERIOR APPROACH (Left)  Patient Location: PACU  Anesthesia Type:General  Level of Consciousness: awake, alert  and oriented  Airway & Oxygen Therapy: Patient Spontanous Breathing and Patient connected to face mask oxygen  Post-op Assessment: Report given to RN and Post -op Vital signs reviewed and stable  Post vital signs: Reviewed and stable  Last Vitals:  Vitals:   06/01/16 1710 06/01/16 1715  BP: (!) 157/81   Pulse: 81 78  Resp: 16 18  Temp: (P) 36.5 C     Last Pain:  Vitals:   06/01/16 1353  TempSrc:   PainSc: 7          Complications: No apparent anesthesia complications

## 2016-06-01 NOTE — Anesthesia Procedure Notes (Signed)
Procedure Name: Intubation Date/Time: 06/01/2016 3:24 PM Performed by: Sherian Maroon A Pre-anesthesia Checklist: Patient identified, Emergency Drugs available, Suction available, Patient being monitored and Timeout performed Patient Re-evaluated:Patient Re-evaluated prior to inductionOxygen Delivery Method: Circle system utilized Preoxygenation: Pre-oxygenation with 100% oxygen Intubation Type: IV induction Ventilation: Mask ventilation without difficulty Laryngoscope Size: Mac and 4 Grade View: Grade I Tube type: Oral Tube size: 7.5 mm Number of attempts: 1 Airway Equipment and Method: Stylet Placement Confirmation: ETT inserted through vocal cords under direct vision and positive ETCO2 Secured at: 22 cm Tube secured with: Tape Dental Injury: Teeth and Oropharynx as per pre-operative assessment

## 2016-06-01 NOTE — H&P (View-Only) (Signed)
Adrienne [name] DOB: 1955/12/14 Divorced / Language: English / Race: White Female Date of Admission:  06/01/2016 CC: Left Hip Pain History of Present Illness The patient is a 61 year old female who comes in for a preoperative History and Physical. The patient is scheduled for a left total hip arthroplasty (anterior) to be performed by Dr. Dione Plover. Aluisio, MD at [facility] on 06-01-2016. The patient is a 61 year old female who presented for their hip. The patient is being followed for their left hip pain and osteoarthritis. They are month(s) out from when symptoms began. Symptoms reported include: pain (constant groin pain/sharp.), instability and pain with weightbearing. The patient feels that they are doing poorly and report their pain level to be moderate to severe. The following medication has been used for pain control: Tylenol with codeine (by Dr. Aline Brochure: medication is not helping.). The patient has not gotten any relief of their symptoms with Cortisone injections (did not help). Her left hip has gotten extremely painful in the past six months or so. It is hurting in the groin. It radiates down to the anterior thigh. This is worse than the pain she had in her right hip before we replaced that 7 about years ago. She is doing great in regards to the right. The left hip is limiting what she can and cannot do. She is readsy to proceed with surgical fixation. They have been treated conservatively in the past for the above stated problem and despite conservative measures, they continue to have progressive pain and severe functional limitations and dysfunction. They have failed non-operative management including home exercise, medications. It is felt that they would benefit from undergoing total joint replacement. Risks and benefits of the procedure have been discussed with the patient and they elect to proceed with surgery. There are no active contraindications to surgery such as ongoing infection  or rapidly progressive neurological disease.  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 Cerebrovascular Accident  Maternal Grandmother. Diabetes Mellitus  Maternal Grandfather. First Degree Relatives  reported Heart Disease  Father, Maternal Grandmother, Mother. Heart disease in female family member before age 60   Social History Children  3 Current work status  disabled Exercise  Exercises daily; does other Former drinker  06/12/2015: In the past drank Living situation  live alone Marital status  divorced No history of drug/alcohol rehab  Not under pain contract  Number of flights of stairs before winded  greater than 5 Tobacco / smoke exposure  06/12/2015: yes outdoors only Tobacco use  Current some day smoker. 06/12/2015: smoke(d) less than 1/2 pack(s) per day  Medication History ALPRAZolam (1MG  Tablet, Oral) Active. Aspirin (81MG  Tablet Chewable, Oral) Active. Beta Carotene with Minerals Active. Biotin Active. Cinnamon Active. Cyclobenzaprine HCl (10MG  Tablet, Oral) Active. Fish Oil Active. Flax Seed Oil Active. Melatonin (10MG  Capsule, Oral) Active. MetFORMIN HCl (500MG  Tablet, Oral) Active. Methylphenidate HCl (20MG  Tablet, Oral) Active. Naproxen Active. Omeprazole (20MG  Capsule DR, Oral) Active. Prazosin HCl (1MG  Capsule, Oral) Active. Crestor Active. Super B  Complex  Active. Venlafaxine HCl ER (150MG  Capsule ER 24HR, Oral) Active. Vitaimin D3 Active. Vitamin E (200UNIT Capsule, 1 (one) Oral) Active.  Past Surgical History Appendectomy  Arthroscopy of Knee  Date: 2014. left Hysterectomy  partial (non-cancerous) Rotator Cuff Repair  bilateral Tonsillectomy  Total Hip Replacement  Date: 2010. right  Review of Systems General Not Present- Chills, Fatigue, Fever, Memory Loss, Night Sweats, Weight Gain and Weight Loss. Skin Not Present- Eczema, Hives, Itching, Lesions and Rash. HEENT Present- Hearing Loss. Not Present- Dentures, Double Vision, Headache, Tinnitus and Visual Loss. Respiratory Not Present- Allergies, Chronic Cough, Coughing up blood, Shortness of breath at rest and Shortness of breath with exertion. Cardiovascular Not Present- Chest Pain, Difficulty Breathing Lying Down, Murmur, Palpitations, Racing/skipping heartbeats and Swelling. Gastrointestinal Not Present- Abdominal Pain, Bloody Stool, Constipation, Diarrhea, Difficulty Swallowing, Heartburn, Jaundice, Loss of appetitie, Nausea and Vomiting. Female Genitourinary Not Present- Blood in Urine, Discharge, Flank Pain, Incontinence, Painful Urination, Urgency, Urinary frequency, Urinary Retention, Urinating at Night and Weak urinary stream. Musculoskeletal Present- Back Pain, Joint Pain, Morning Stiffness and Spasms. Not Present- Joint Swelling, Muscle Pain and Muscle Weakness. Neurological Not Present- Blackout spells, Difficulty with balance, Dizziness, Paralysis, Tremor and Weakness. Psychiatric Not Present- Insomnia.  Vitals  Weight: 187 lb Height: 65in Weight was reported by patient. Height was reported by patient. Body Surface Area: 1.92 m Body Mass Index: 31.12 kg/m  Pulse: 76 (Regular)  BP: 142/78 (Sitting, Left Arm, Standard)  Physical Exam  General Mental Status -Alert, cooperative and good historian. General Appearance-pleasant, Not in acute  distress. Orientation-Oriented X3. Build & Nutrition-Well nourished and Well developed.  Head and Neck Head-normocephalic, atraumatic . Neck Global Assessment - supple, no bruit auscultated on the right, no bruit auscultated on the left.  Eye Pupil - Bilateral-Regular and Round. Motion - Bilateral-EOMI.  Chest and Lung Exam Auscultation Breath sounds - clear at anterior chest wall and clear at posterior chest wall. Adventitious sounds - Inspiratory wheeze - Right Lower Lobe (Anterior)(clears with coughing) and Right Middle Lobe (Anterior).  Cardiovascular Auscultation Rhythm - Regular rate and rhythm. Heart Sounds - S1 WNL and S2 WNL. Murmurs & Other Heart Sounds - Auscultation of the heart reveals - No Murmurs.  Abdomen Palpation/Percussion Tenderness - Abdomen is non-tender to palpation. Rigidity (guarding) - Abdomen is soft. Auscultation Auscultation of the abdomen reveals - Bowel sounds normal.  Female Genitourinary Note: Not done, not pertinent to present illness   Musculoskeletal Note: On exam, she is in no distress. Right hip can be flexed to 120, rotated in 30, out 40, abduct to 40 without discomfort. Left hip flexion to 90. No internal rotation, 5 degrees of external rotation and about 5 to 10 degrees of abduction.  RADIOGRAPHS Review show the prosthesis on the right in excellent position with no periprosthetic abnormalities. On the left, she has bone on bone arthritis in that hip with the some osteophyte formation.  Assessment & Plan Primary osteoarthritis of left hip (M16.12)  Note:Surgical Plans: Left Total Hip Replacement - Anterior Approach  Disposition: Hme with Family  PCP: Dr. Meda Coffee  IV TXA  Anesthesia Issues: None excpet for a Hypotension episode once  Patient was instructed on what medications to stop prior to surgery.  Signed electronically by Joelene Millin, III PA-C

## 2016-06-01 NOTE — Op Note (Signed)
OPERATIVE REPORT- TOTAL HIP ARTHROPLASTY   PREOPERATIVE DIAGNOSIS: Osteoarthritis of the Left hip.   POSTOPERATIVE DIAGNOSIS: Osteoarthritis of the Left  hip.   PROCEDURE: Left total hip arthroplasty, anterior approach.   SURGEON: Gaynelle Arabian, MD   ASSISTANT: Arlee Muslim, PA-C  ANESTHESIA:  General  ESTIMATED BLOOD LOSS:- 500 ml    DRAINS: Hemovac x1.   COMPLICATIONS: None   CONDITION: PACU - hemodynamically stable.   BRIEF CLINICAL NOTE: Adrienne [name] is a 61 y.o. female who has advanced end-  stage arthritis of their Left hip with progressively worsening pain and  dysfunction.The patient has failed nonoperative management and presents for  total hip arthroplasty.   PROCEDURE IN DETAIL: After successful administration of spinal  anesthetic, the traction boots for the [facility] bed were placed on both  feet and the patient was placed onto the [facility] bed, boots placed into the leg  holders. The Left hip was then isolated from the perineum with plastic  drapes and prepped and draped in the usual sterile fashion. ASIS and  greater trochanter were marked and a oblique incision was made, starting  at about 1 cm lateral and 2 cm distal to the ASIS and coursing towards  the anterior cortex of the femur. The skin was cut with a 10 blade  through subcutaneous tissue to the level of the fascia overlying the  tensor fascia lata muscle. The fascia was then incised in line with the  incision at the junction of the anterior third and posterior 2/3rd. The  muscle was teased off the fascia and then the interval between the TFL  and the rectus was developed. The Hohmann retractor was then placed at  the top of the femoral neck over the capsule. The vessels overlying the  capsule were cauterized and the fat on top of the capsule was removed.  A Hohmann retractor was then placed anterior underneath the rectus  femoris to give exposure to the entire anterior capsule. A T-shaped   capsulotomy was performed. The edges were tagged and the femoral head  was identified.       Osteophytes are removed off the superior acetabulum.  The femoral neck was then cut in situ with an oscillating saw. Traction  was then applied to the left lower extremity utilizing the [facility]  traction. The femoral head was then removed. Retractors were placed  around the acetabulum and then circumferential removal of the labrum was  performed. Osteophytes were also removed. Reaming starts at 45 mm to  medialize and  Increased in 2 mm increments to 47 mm. We reamed in  approximately 40 degrees of abduction, 20 degrees anteversion. A 48 mm  pinnacle acetabular shell was then impacted in anatomic position under  fluoroscopic guidance with excellent purchase. We did not need to place  any additional dome screws. A 28 mm neutral + 4 marathon liner was then  placed into the acetabular shell.       The femoral lift was then placed along the lateral aspect of the femur  just distal to the vastus ridge. The leg was  externally rotated and capsule  was stripped off the inferior aspect of the femoral neck down to the  level of the lesser trochanter, this was done with electrocautery. The femur was lifted after this was performed. The  leg was then placed in an extended and adducted position essentially delivering the femur. We also removed the capsule superiorly and the piriformis from the piriformis  fossa to gain excellent exposure of the  proximal femur. Rongeur was used to remove some cancellous bone to get  into the lateral portion of the proximal femur for placement of the  initial starter reamer. The starter broaches was placed  the starter broach  and was shown to go down the center of the canal. Broaching  with the  Corail system was then performed starting at size 8, coursing  Up to size 12. A size 12 had excellent torsional and rotational  and axial stability. The trial high offset neck was then  placed  with a 28 + 8.5 trial head. The hip was then reduced. We confirmed that  the stem was in the canal both on AP and lateral x-rays. It also has excellent sizing. The hip was reduced with outstanding stability through full extension and full external rotation.. AP pelvis was taken and the leg lengths were measured and found to be equal. Hip was then dislocated again and the femoral head and neck removed. The  femoral broach was removed. Size 12 Corail stem with a high offset  neck was then impacted into the femur following native anteversion. Has  excellent purchase in the canal. Excellent torsional and rotational and  axial stability. It is confirmed to be in the canal on AP and lateral  fluoroscopic views. The 28 + 8.5 ceramic head was placed and the hip  reduced with outstanding stability. Again AP pelvis was taken and it  confirmed that the leg lengths were equal. The wound was then copiously  irrigated with saline solution and the capsule reattached and repaired  with Ethibond suture. 30 ml of .25% Bupivicaine was  injected into the capsule and into the edge of the tensor fascia lata as well as subcutaneous tissue. The fascia overlying the tensor fascia lata was then closed with a running #1 V-Loc. Subcu was closed with interrupted 2-0 Vicryl and subcuticular running 4-0 Monocryl. Incision was cleaned  and dried. Steri-Strips and a bulky sterile dressing applied. Hemovac  drain was hooked to suction and then the patient was awakened and transported to  recovery in stable condition.        Please note that a surgical assistant was a medical necessity for this procedure to perform it in a safe and expeditious manner. Assistant was necessary to provide appropriate retraction of vital neurovascular structures and to prevent femoral fracture and allow for anatomic placement of the prosthesis.  Gaynelle Arabian, M.D.

## 2016-06-02 ENCOUNTER — Encounter (HOSPITAL_COMMUNITY): Payer: Self-pay | Admitting: Orthopedic Surgery

## 2016-06-02 LAB — BASIC METABOLIC PANEL
ANION GAP: 6 (ref 5–15)
BUN: 15 mg/dL (ref 6–20)
CO2: 28 mmol/L (ref 22–32)
Calcium: 8.4 mg/dL — ABNORMAL LOW (ref 8.9–10.3)
Chloride: 101 mmol/L (ref 101–111)
Creatinine, Ser: 0.82 mg/dL (ref 0.44–1.00)
GFR calc Af Amer: >60 mL/min (ref >60)
GLUCOSE: 188 mg/dL — AB (ref 65–99)
POTASSIUM: 4.5 mmol/L (ref 3.5–5.1)
Sodium: 135 mmol/L (ref 135–145)

## 2016-06-02 LAB — CBC
HEMATOCRIT: 35.7 % — AB (ref 36.0–46.0)
Hemoglobin: 11.8 g/dL — ABNORMAL LOW (ref 12.0–15.0)
MCH: 30.4 pg (ref 26.0–34.0)
MCHC: 33.1 g/dL (ref 30.0–36.0)
MCV: 92 fL (ref 78.0–100.0)
Platelets: 251 10*3/uL (ref 150–400)
RBC: 3.88 MIL/uL (ref 3.87–5.11)
RDW: 14 % (ref 11.5–15.5)
WBC: 13.7 10*3/uL — AB (ref 4.0–10.5)

## 2016-06-02 LAB — GLUCOSE, CAPILLARY
GLUCOSE-CAPILLARY: 164 mg/dL — AB (ref 65–99)
Glucose-Capillary: 269 mg/dL — ABNORMAL HIGH (ref 65–99)
Glucose-Capillary: 175 mg/dL — ABNORMAL HIGH (ref 65–99)

## 2016-06-02 MED ORDER — RIVAROXABAN 10 MG PO TABS: 10.0000 mg | ORAL_TABLET | Freq: Every day | ORAL | 0 refills | Status: DC

## 2016-06-02 MED ORDER — ALPRAZOLAM 1 MG PO TABS
1.0000 mg | ORAL_TABLET | Freq: Three times a day (TID) | ORAL | Status: DC | PRN
  Administered 2016-06-02: 09:00:00 1 mg via ORAL
  Filled 2016-06-02: qty 1

## 2016-06-02 MED ORDER — LIP MEDEX EX OINT
TOPICAL_OINTMENT | CUTANEOUS | Status: AC
  Filled 2016-06-02: qty 7

## 2016-06-02 MED ORDER — NON FORMULARY: 20.0000 mg | Freq: Every day | Status: DC

## 2016-06-02 MED ORDER — OXYCODONE HCL 5 MG PO TABS: 5.0000 mg | ORAL_TABLET | ORAL | 0 refills | Status: DC | PRN

## 2016-06-02 MED ORDER — OMEPRAZOLE 20 MG PO CPDR
20.0000 mg | DELAYED_RELEASE_CAPSULE | Freq: Every day | ORAL | Status: DC
  Administered 2016-06-02: 20 mg via ORAL
  Filled 2016-06-02: qty 1

## 2016-06-02 MED ORDER — METHOCARBAMOL 500 MG PO TABS: 500.0000 mg | ORAL_TABLET | Freq: Four times a day (QID) | ORAL | 0 refills | Status: DC | PRN

## 2016-06-02 NOTE — Progress Notes (Signed)
Subjective: 1 Day Post-Op Procedure(s) (LRB): LEFT TOTAL HIP ARTHROPLASTY ANTERIOR APPROACH (Left) Patient reports pain as mild.   Patient seen in rounds for Dr. Wynelle Link. Patient is well, but has had some minor complaints of pain in the hip and thigh, requiring pain medications We will start therapy today.  If they do well with therapy and meets all goals, then will allow home later this afternoon following therapy. Plan is to go Home after hospital stay.  Objective: Vital signs in last 24 hours: Temp:  [97.5 F (36.4 C)-98.6 F (37 C)] 98.6 F (37 C) (03/27 0457) Pulse Rate:  [60-81] 61 (03/27 0457) Resp:  [12-18] 12 (03/27 0457) BP: (103-157)/(50-81) 111/62 (03/27 0457) SpO2:  [91 %-98 %] 95 % (03/27 0457) Weight:  [84.4 kg (186 lb)] 84.4 kg (186 lb) (03/26 1353)  Intake/Output from previous day:  Intake/Output Summary (Last 24 hours) at 06/02/16 0847 Last data filed at 06/02/16 0700  Gross per 24 hour  Intake             3700 ml  Output             2325 ml  Net             1375 ml    Intake/Output this shift: No intake/output data recorded.  Labs:  Recent Labs  06/02/16 0419  HGB 11.8*    Recent Labs  06/02/16 0419  WBC 13.7*  RBC 3.88  HCT 35.7*  PLT 251    Recent Labs  06/02/16 0419  NA 135  K 4.5  CL 101  CO2 28  BUN 15  CREATININE 0.82  GLUCOSE 188*  CALCIUM 8.4*   No results for input(s): LABPT, INR in the last 72 hours.  EXAM General - Patient is Alert, Appropriate and Oriented Extremity - Neurovascular intact Sensation intact distally Intact pulses distally Dorsiflexion/Plantar flexion intact Dressing - dressing C/D/I Motor Function - intact, moving foot and toes well on exam.  Hemovac pulled without difficulty.  Past Medical History:  Diagnosis Date  . Allergic rhinitis   . Allergy   . Anemia    leukocytosis/thrombocytosis--Dr. Tressie Stalker  . Anxiety   . Arthritis   . Asthma   . Brain injury ([place])    Closed head  .  Closed head injury 2005  . COPD (chronic obstructive pulmonary disease) ([place])   . Depression    ? Bipolar Disorder--Dr, Jimmye Norman  . Emphysema of lung ([place])   . GERD (gastroesophageal reflux disease)    Barrett's esophagus  . Headache    migraines  . Hx of migraines   . Hypertension   . Low back pain   . Macular degeneration, bilateral    Dry-Dr. Iona Hansen  . Neuromuscular disorder ([place])   . NIDDM (non-insulin dependent diabetes mellitus)   . OSA (obstructive sleep apnea)   . RLS (restless legs syndrome)   . Seizure disorder ([place])    ? absence  . Seizures ([place])   . Sleep apnea   . Status post right hip replacement   . Status post rotator cuff surgery     Assessment/Plan: 1 Day Post-Op Procedure(s) (LRB): LEFT TOTAL HIP ARTHROPLASTY ANTERIOR APPROACH (Left) Principal Problem:   OA (osteoarthritis) of hip  Estimated body mass index is 30.95 kg/m as calculated from the following:   Height as of this encounter: 5\' 5"  (1.651 m).   Weight as of this encounter: 84.4 kg (186 lb). Up with therapy Discharge home with home health  DVT  Prophylaxis - Xarelto Weight Bearing As Tolerated left Leg Hemovac Pulled Begin Therapy  If meets goals and able to go home: Up with therapy Diet - Cardiac diet and Diabetic diet Follow up - in 2 weeks Activity - WBAT Disposition - Home Condition Upon Discharge - pending therapy D/C Meds - See DC Summary DVT Prophylaxis - Xarelto  Arlee Muslim, PA-C Orthopaedic Surgery 06/02/2016, 8:47 AM

## 2016-06-02 NOTE — Progress Notes (Signed)
06/02/16 1429  PT Visit Information  Last PT Received On 06/02/16--pt doing well, feels ready for D/C, RN made aware  Assistance Needed +1  History of Present Illness s/p L DA THA  Subjective Data  Patient Stated Goal less pain with mobility  Precautions  Precautions Fall  Restrictions  Other Position/Activity Restrictions WBAT  Pain Assessment  Pain Assessment 0-10  Pain Score 4  Pain Location Left hip  Pain Descriptors / Indicators Aching;Sore  Pain Intervention(s) Limited activity within patient's tolerance;Monitored during session;Premedicated before session  Cognition  Arousal/Alertness Awake/alert ("sleepy")  Behavior During Therapy WFL for tasks assessed/performed  Overall Cognitive Status Within Functional Limits for tasks assessed  Bed Mobility  Overal bed mobility Needs Assistance  Bed Mobility Sit to Supine  Sit to supine Supervision  General bed mobility comments for safety  Transfers  Overall transfer level Needs assistance  Equipment used Rolling walker (2 wheeled)  Sit to Stand Min guard  General transfer comment cues for hand placement and LLE position  Ambulation/Gait  Ambulation/Gait assistance Supervision;Min guard  Ambulation Distance (Feet) 40 Feet  Assistive device Rolling walker (2 wheeled)  Gait Pattern/deviations Step-to pattern;Decreased weight shift to left  General Gait Details cues for sequence and RW position  Stairs Yes  Stairs assistance Min assist  Stair Management No rails;Step to pattern;Backwards;With walker  Number of Stairs 2  General stair comments cues for sequence and safe technique  Total Joint Exercises  Ankle Circles/Pumps AROM;Both  Quad Sets Both;AROM;10 reps  Heel Slides AAROM;10 reps;Left  Hip ABduction/ADduction AAROM;Left;10 reps  PT - End of Session  Equipment Utilized During Treatment Gait belt  Activity Tolerance Patient tolerated treatment well  Patient left in chair;with call bell/phone within reach;with chair  alarm set  Nurse Communication Other (comment) (ready to D/C)  PT - Assessment/Plan  PT Plan Current plan remains appropriate  PT Visit Diagnosis Other abnormalities of gait and mobility (R26.89)  PT Frequency (ACUTE ONLY) 7X/week  Follow Up Recommendations Home health PT (or per MD)  PT equipment None recommended by PT  AM-PAC PT "6 Clicks" Daily Activity Outcome Measure  Difficulty turning over in bed (including adjusting bedclothes, sheets and blankets)? 3  Difficulty moving from lying on back to sitting on the side of the bed?  3  Difficulty sitting down on and standing up from a chair with arms (e.g., wheelchair, bedside commode, etc,.)? 3  Help needed moving to and from a bed to chair (including a wheelchair)? 3  Help needed walking in hospital room? 3  Help needed climbing 3-5 steps with a railing?  3  6 Click Score 18  Mobility G Code  CK  PT Goal Progression  Progress towards PT goals Progressing toward goals  Acute Rehab PT Goals  PT Goal Formulation With patient  Time For Goal Achievement 06/05/16  Potential to Achieve Goals Good  PT Time Calculation  PT Start Time (ACUTE ONLY) 1402  PT Stop Time (ACUTE ONLY) 1430  PT Time Calculation (min) (ACUTE ONLY) 28 min  PT General Charges  $$ ACUTE PT VISIT 1 Procedure  PT Treatments  $Gait Training 8-22 mins  $Therapeutic Exercise 8-22 mins

## 2016-06-02 NOTE — Progress Notes (Signed)
The RN and I discussed discharge instructions with patient and family. All questions answered. Paperwork and prescriptions given.    Nurse Tech took patient to family vehicle in wheelchair with all belongings.

## 2016-06-02 NOTE — Care Management Note (Signed)
Case Management Note  Patient Details  Name: DEANNA WIATER MRN: [id number] Date of Birth: [birth year]  Subjective/Objective:                  LEFT TOTAL HIP ARTHROPLASTY ANTERIOR APPROACH (Left) Action/Plan: Discharge planning Expected Discharge Date:  06/02/16               Expected Discharge Plan:  [place]  In-House Referral:     Discharge planning Services  CM Consult  Post Acute Care Choice:  Home Health Choice offered to:  Patient  DME Arranged:  3-N-1 DME Agency:  [place]:  [place]:  [facility]  Status of Service:  Completed, signed off  If discussed at [employer] of Stay Meetings, dates discussed:    Additional Comments: CM met with pt in room to offer choice of home health agency. Pt chooses [facility] to render HHPT. Referral given to [facility] rep, Tim. CM has requested face to face and HHPT orders.  CM notified [place] DME rep, Jermaine to please deliver the 3n1 to room prior to discharge. No other CM needs were communicated. Dellie Catholic, RN 06/02/2016, 12:15 PM

## 2016-06-02 NOTE — Care Management Note (Signed)
Case Management Note  Patient Details  Name: FLEURETTE WOOLBRIGHT MRN: [id number] Date of Birth: [birth year]  Subjective/Objective:                  LEFT TOTAL HIP ARTHROPLASTY ANTERIOR APPROACH (Left) Action/Plan: Discharge planning Expected Discharge Date:  06/02/16               Expected Discharge Plan:  [place]  In-House Referral:     Discharge planning Services  CM Consult  Post Acute Care Choice:  Home Health Choice offered to:  Patient  DME Arranged:  3-N-1 DME Agency:  [place]:  [place]:  [facility] ([facility])  Status of Service:  Completed, signed off  If discussed at [employer] of Stay Meetings, dates discussed:    Additional Comments: CM met with pt in room to offer choice of home health agency. Pt chooses [facility] to render HHPT. Referral given to [facility] rep who is waiting for face to face and HHPT order. CM notified [facility] [place] DME rep, Jermaine to please deliver the 3n1 to room prior to discharge.  NO other CM needs were communicated. Dellie Catholic, RN 06/02/2016, 12:18 PM

## 2016-06-02 NOTE — Discharge Summary (Signed)
Physician Discharge Summary   Patient ID: Adrienne [name] MRN: [id number] DOB/AGE: [birth year] 61 y.o.  Admit date: 06/01/2016 Discharge date: 06-02-2016  Primary Diagnosis:  Osteoarthritis of the Left hip.  Admission Diagnoses:  Past Medical History:  Diagnosis Date  . Allergic rhinitis   . Allergy   . Anemia    leukocytosis/thrombocytosis--Dr. Tressie Stalker  . Anxiety   . Arthritis   . Asthma   . 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 Closed head injury 2005  . COPD (chronic obstructive pulmonary disease) ([place])   . Depression    ? Bipolar Disorder--Dr, Jimmye Norman  . Emphysema of lung ([place])   . GERD (gastroesophageal reflux disease)    Barrett's esophagus  . Headache    migraines  . Hx of migraines   . Hypertension   . Low back pain   . Macular degeneration, bilateral    Dry-Dr. Iona Hansen  . Neuromuscular disorder ([place])   . NIDDM (non-insulin dependent diabetes mellitus)   . OSA (obstructive sleep apnea)   . RLS (restless legs syndrome)   . Seizure disorder ([place])    ? absence  . Seizures ([place])   . Sleep apnea   . Status post right hip replacement   . Status post rotator cuff surgery    Discharge Diagnoses:   Principal Problem:   OA (osteoarthritis) of hip  Estimated body mass index is 30.95 kg/m as calculated from the following:   Height as of this encounter: _0  (1.651 m).   Weight as of this encounter: 84.4 kg (186 lb).  Procedure(s) (LRB): LEFT TOTAL HIP ARTHROPLASTY ANTERIOR APPROACH (Left)   Consults: None  HPI: Adrienne [name] is a 61 y.o. female who has advanced end-  stage arthritis of their Left hip with progressively worsening pain and  dysfunction.The patient has failed nonoperative management and presents for  total hip arthroplasty.  Laboratory Data: Admission on 06/01/2016  Component Date Value Ref Range Status  . Glucose-Capillary 06/01/2016 150* 65 - 99 mg/dL Final  . Comment 1 06/01/2016 Notify RN   Final  . Glucose-Capillary 06/01/2016 157* 65  - 99 mg/dL Final  . Comment 1 06/01/2016 Document in Chart   Final  . WBC 06/02/2016 13.7* 4.0 - 10.5 K/uL Final  . RBC 06/02/2016 3.88  3.87 - 5.11 MIL/uL Final  . Hemoglobin 06/02/2016 11.8* 12.0 - 15.0 g/dL Final  . HCT 06/02/2016 35.7* 36.0 - 46.0 % Final  . MCV 06/02/2016 92.0  78.0 - 100.0 fL Final  . MCH 06/02/2016 30.4  26.0 - 34.0 pg Final  . MCHC 06/02/2016 33.1  30.0 - 36.0 g/dL Final  . RDW 06/02/2016 14.0  11.5 - 15.5 % Final  . Platelets 06/02/2016 251  150 - 400 K/uL Final  . Sodium 06/02/2016 135  135 - 145 mmol/L Final  . Potassium 06/02/2016 4.5  3.5 - 5.1 mmol/L Final  . Chloride 06/02/2016 101  101 - 111 mmol/L Final  . CO2 06/02/2016 28  22 - 32 mmol/L Final  . Glucose, Bld 06/02/2016 188* 65 - 99 mg/dL Final  . BUN 06/02/2016 15  6 - 20 mg/dL Final  . Creatinine, Ser 06/02/2016 0.82  0.44 - 1.00 mg/dL Final  . Calcium 06/02/2016 8.4* 8.9 - 10.3 mg/dL Final  . GFR calc non Af Amer 06/02/2016 >60  >60 mL/min Final  . GFR calc Af Amer 06/02/2016 >60  >60 mL/min Final   Comment: (NOTE) The eGFR has been calculated using the CKD  EPI equation. This calculation has not been validated in all clinical situations. eGFR's persistently <60 mL/min signify possible Chronic Kidney Disease.   . Anion gap 06/02/2016 6  5 - 15 Final  . Glucose-Capillary 06/01/2016 269* 65 - 99 mg/dL Final  . Glucose-Capillary 06/02/2016 164* 65 - 99 mg/dL Final  Hospital Outpatient Visit on 05/28/2016  Component Date Value Ref Range Status  . Glucose-Capillary 05/28/2016 197* 65 - 99 mg/dL Final  . aPTT 05/28/2016 28  24 - 36 seconds Final  . Sodium 05/28/2016 138  135 - 145 mmol/L Final  . Potassium 05/28/2016 4.2  3.5 - 5.1 mmol/L Final  . Chloride 05/28/2016 106  101 - 111 mmol/L Final  . CO2 05/28/2016 24  22 - 32 mmol/L Final  . Glucose, Bld 05/28/2016 189* 65 - 99 mg/dL Final  . BUN 05/28/2016 19  6 - 20 mg/dL Final  . Creatinine, Ser 05/28/2016 0.74  0.44 - 1.00 mg/dL Final  .  Calcium 05/28/2016 9.2  8.9 - 10.3 mg/dL Final  . Total Protein 05/28/2016 7.2  6.5 - 8.1 g/dL Final  . Albumin 05/28/2016 4.0  3.5 - 5.0 g/dL Final  . AST 05/28/2016 19  15 - 41 U/L Final  . ALT 05/28/2016 16  14 - 54 U/L Final  . Alkaline Phosphatase 05/28/2016 67  38 - 126 U/L Final  . Total Bilirubin 05/28/2016 0.6  0.3 - 1.2 mg/dL Final  . GFR calc non Af Amer 05/28/2016 >60  >60 mL/min Final  . GFR calc Af Amer 05/28/2016 >60  >60 mL/min Final   Comment: (NOTE) The eGFR has been calculated using the CKD EPI equation. This calculation has not been validated in all clinical situations. eGFR's persistently <60 mL/min signify possible Chronic Kidney Disease.   . Anion gap 05/28/2016 8  5 - 15 Final  . Prothrombin Time 05/28/2016 12.5  11.4 - 15.2 seconds Final  . INR 05/28/2016 0.93   Final  . ABO/RH(D) 05/28/2016 O POS   Final  . Antibody Screen 05/28/2016 NEG   Final  . Sample Expiration 05/28/2016 06/04/2016   Final  . Extend sample reason 05/28/2016 NO TRANSFUSIONS OR PREGNANCY IN THE PAST 3 MONTHS   Final  . MRSA, PCR 05/28/2016 NEGATIVE  NEGATIVE Final  . Staphylococcus aureus 05/28/2016 NEGATIVE  NEGATIVE Final   Comment:        The Xpert SA Assay (FDA approved for NASAL specimens in patients over 12 years of age), is one component of a comprehensive surveillance program.  Test performance has been validated by [facility] for patients greater than or equal to 46 year old. It is not intended to diagnose infection nor to guide or monitor treatment.   Hospital Outpatient Visit on 05/25/2016  Component Date Value Ref Range Status  . Specimen Description 05/25/2016 URINE, CLEAN CATCH   Final  . Special Requests 05/25/2016 NONE   Final  . Culture 05/25/2016 >=100,000 COLONIES/mL ESCHERICHIA COLI*  Final  . Report Status 05/25/2016 05/27/2016 FINAL   Final  . Organism ID, Bacteria 05/25/2016 ESCHERICHIA COLI*  Final  Office Visit on 05/22/2016  Component Date Value Ref  Range Status  . Sodium 05/22/2016 140  135 - 146 mmol/L Final  . Potassium 05/22/2016 4.7  3.5 - 5.3 mmol/L Final  . Chloride 05/22/2016 106  98 - 110 mmol/L Final  . CO2 05/22/2016 26  20 - 31 mmol/L Final  . Glucose, Bld 05/22/2016 119* 65 - 99 mg/dL Final  . BUN  05/22/2016 27* 7 - 25 mg/dL Final  . Creat 05/22/2016 0.79  0.50 - 0.99 mg/dL Final   Comment:   For patients > or = 62 years of age: The upper reference limit for Creatinine is approximately 13% higher for people identified as African-American.     . Total Bilirubin 05/22/2016 0.3  0.2 - 1.2 mg/dL Final  . Alkaline Phosphatase 05/22/2016 67  33 - 130 U/L Final  . AST 05/22/2016 13  10 - 35 U/L Final  . ALT 05/22/2016 13  6 - 29 U/L Final  . Total Protein 05/22/2016 6.4  6.1 - 8.1 g/dL Final  . Albumin 05/22/2016 3.7  3.6 - 5.1 g/dL Final  . Calcium 05/22/2016 9.6  8.6 - 10.4 mg/dL Final  . GFR, Est African American 05/22/2016 >89  >=60 mL/min Final  . GFR, Est Non African American 05/22/2016 81  >=60 mL/min Final  . WBC 05/22/2016 10.4  3.8 - 10.8 K/uL Final  . RBC 05/22/2016 4.66  3.80 - 5.10 MIL/uL Final  . Hemoglobin 05/22/2016 14.0  11.7 - 15.5 g/dL Final  . HCT 05/22/2016 43.0  35.0 - 45.0 % Final  . MCV 05/22/2016 92.3  80.0 - 100.0 fL Final  . MCH 05/22/2016 30.0  27.0 - 33.0 pg Final  . MCHC 05/22/2016 32.6  32.0 - 36.0 g/dL Final  . RDW 05/22/2016 13.8  11.0 - 15.0 % Final  . Platelets 05/22/2016 267  140 - 400 K/uL Final  . MPV 05/22/2016 9.7  7.5 - 12.5 fL Final  . Neutro Abs 05/22/2016 6760  1,500 - 7,800 cells/uL Final  . Lymphs Abs 05/22/2016 2600  850 - 3,900 cells/uL Final  . Monocytes Absolute 05/22/2016 936  200 - 950 cells/uL Final  . Eosinophils Absolute 05/22/2016 104  15 - 500 cells/uL Final  . Basophils Absolute 05/22/2016 0  0 - 200 cells/uL Final  . Neutrophils Relative % 05/22/2016 65  % Final  . Lymphocytes Relative 05/22/2016 25  % Final  . Monocytes Relative 05/22/2016 9  % Final  .  Eosinophils Relative 05/22/2016 1  % Final  . Basophils Relative 05/22/2016 0  % Final  . Smear Review 05/22/2016 Criteria for review not met   Final  . Hgb A1c MFr Bld 05/22/2016 6.2* <5.7 % Final   Comment:   For someone without known diabetes, a hemoglobin A1c value between 5.7% and 6.4% is consistent with prediabetes and should be confirmed with a follow-up test.   For someone with known diabetes, a value <7% indicates that their diabetes is well controlled. A1c targets should be individualized based on duration of diabetes, age, co-morbid conditions and other considerations.   This assay result is consistent with an increased risk of diabetes.   Currently, no consensus exists regarding use of hemoglobin A1c for diagnosis of diabetes in children.     . Mean Plasma Glucose 05/22/2016 131  mg/dL Final  . HCV Ab 05/22/2016 NEGATIVE  NEGATIVE Final  . Color, Urine 05/22/2016 YELLOW  YELLOW Final  . APPearance 05/22/2016 CLEAR  CLEAR Final  . Specific Gravity, Urine 05/22/2016 1.018  1.001 - 1.035 Final  . pH 05/22/2016 6.0  5.0 - 8.0 Final  . Glucose, UA 05/22/2016 NEGATIVE  NEGATIVE Final  . Bilirubin Urine 05/22/2016 NEGATIVE  NEGATIVE Final  . Ketones, ur 05/22/2016 NEGATIVE  NEGATIVE Final  . Hgb urine dipstick 05/22/2016 NEGATIVE  NEGATIVE Final  . Protein, ur 05/22/2016 NEGATIVE  NEGATIVE Final  . Nitrite 05/22/2016  POSITIVE* NEGATIVE Final  . Leukocytes, UA 05/22/2016 NEGATIVE  NEGATIVE Final  . Creatinine, Urine 05/22/2016 62  20 - 320 mg/dL Final  . Microalb, Ur 05/22/2016 0.4  Not estab mg/dL Final  . Microalb Creat Ratio 05/22/2016 6  <30 mcg/mg creat Final   Comment: The ADA has defined abnormalities in albumin excretion as follows:           Category           Result                            (mcg/mg creatinine)                 Normal:    <30       Microalbuminuria:    30 - 299   Clinical albuminuria:    > or = 300   The ADA recommends that at least two of  three specimens collected within a 3 - 6 month period be abnormal before considering a patient to be within a diagnostic category.     [name] TSH 05/22/2016 1.61  mIU/L Final   Comment:   Reference Range   > or = 20 Years  0.40-4.50   Pregnancy Range First trimester  0.26-2.66 Second trimester 0.55-2.73 Third trimester  0.43-2.91     . Cholesterol 05/22/2016 195  <200 mg/dL Final  . Triglycerides 05/22/2016 147  <150 mg/dL Final  . HDL 05/22/2016 38* >50 mg/dL Final  . Total CHOL/HDL Ratio 05/22/2016 5.1* <5.0 Ratio Final  . VLDL 05/22/2016 29  <30 mg/dL Final  . LDL Cholesterol 05/22/2016 128* <100 mg/dL Final  . Vit D, 25-Hydroxy 05/22/2016 42  30 - 100 ng/mL Final   Comment: Vitamin D Status           25-OH Vitamin D        Deficiency                <20 ng/mL        Insufficiency         20 - 29 ng/mL        Optimal             > or = 30 ng/mL   For 25-OH Vitamin D testing on patients on D2-supplementation and patients for whom quantitation of D2 and D3 fractions is required, the QuestAssureD 25-OH VIT D, (D2,D3), LC/MS/MS is recommended: order code [id number] (patients > 2 yrs).   . WBC, UA 05/22/2016 0-5  <=5 WBC/HPF Final  . RBC / HPF 05/22/2016 NONE SEEN  <=2 RBC/HPF Final  . Squamous Epithelial / LPF 05/22/2016 0-5  <=5 HPF Final  . Bacteria, UA 05/22/2016 FEW* NONE SEEN HPF Final  . Crystals 05/22/2016 NONE SEEN  NONE SEEN HPF Final  . Casts 05/22/2016 NONE SEEN  NONE SEEN LPF Final  . Yeast 05/22/2016 NONE SEEN  NONE SEEN HPF Final     X-Rays:Dg Pelvis Portable  Result Date: 06/01/2016 CLINICAL DATA:  Postop total left hip arthroplasty. EXAM: PORTABLE PELVIS 1-2 VIEWS COMPARISON:  None. FINDINGS: Well seated components of a total left hip arthroplasty. No complicating features. There is a remote right total hip arthroplasty in good position without complicating features. The pubic symphysis and SI joints are grossly normal. No pelvic fractures. IMPRESSION: Well seated  components of a total left hip arthroplasty. Electronically Signed   By: Marijo Sanes M.D.   On: 06/01/2016 17:34  Dg C-arm 1-60 Min-no Report  Result Date: 06/01/2016 Fluoroscopy was utilized by the requesting physician.  No radiographic interpretation.    EKG: Orders placed or performed in visit on 05/25/16  . EKG 12-Lead     Hospital Course: Patient was admitted to [facility] and taken to the OR and underwent the above state procedure without complications.  Patient tolerated the procedure well and was later transferred to the recovery room and then to the orthopaedic floor for postoperative care.  They were given PO and IV analgesics for pain control following their surgery.  They were given 24 hours of postoperative antibiotics of  Anti-infectives    Start     Dose/Rate Route Frequency Ordered Stop   06/02/16 0600  ceFAZolin (ANCEF) IVPB 2g/100 mL premix     2 g 200 mL/hr over 30 Minutes Intravenous On call to O.R. 06/01/16 1343 06/01/16 1532   06/01/16 2200  ciprofloxacin (CIPRO) tablet 500 mg     500 mg Oral 2 times daily 06/01/16 1819     06/01/16 2130  ceFAZolin (ANCEF) IVPB 2g/100 mL premix     2 g 200 mL/hr over 30 Minutes Intravenous Every 6 hours 06/01/16 1819 06/02/16 0448   06/01/16 1407  ceFAZolin (ANCEF) 2-4 GM/100ML-% IVPB    Comments:  Bridget Hartshorn   : cabinet override      06/01/16 1407 06/01/16 1532     and started on DVT prophylaxis in the form of Xarelto.   PT and OT were ordered for total hip protocol.  The patient was allowed to be WBAT with therapy. Discharge planning was consulted to help with postop disposition and equipment needs.  Patient had a decent night on the evening of surgery.  They started to get up OOB with therapy on day one.  Hemovac drain was pulled without difficulty. Dressing was checked and was clean and dry. Patient was seen in rounds and was ready to go home later that same day following therapy  Diet - Cardiac diet and  Diabetic diet Follow up - in 2 weeks Activity - WBAT Disposition - Home Condition Upon Discharge - Improving D/C Meds - See DC Summary DVT Prophylaxis - Xarelto  Discharge Instructions    Call MD / Call 911    Complete by:  As directed    If you experience chest pain or shortness of breath, CALL 911 and be transported to the hospital emergency room.  If you develope a fever above 101 F, pus (white drainage) or increased drainage or redness at the wound, or calf pain, call your surgeon's office.   Change dressing    Complete by:  As directed    You may change your dressing dressing daily with sterile 4 x 4 inch gauze dressing and paper tape.  Do not submerge the incision under water.   Constipation Prevention    Complete by:  As directed    Drink plenty of fluids.  Prune juice may be helpful.  You may use a stool softener, such as Colace (over the counter) 100 mg twice a day.  Use MiraLax (over the counter) for constipation as needed.   Diet - low sodium heart healthy    Complete by:  As directed    Diet Carb Modified    Complete by:  As directed    Discharge instructions    Complete by:  As directed    Pick up stool softner and laxative for home use following surgery while on pain  medications. Do not submerge incision under water. Please use good hand washing techniques while changing dressing each day. May shower starting three days after surgery. Please use a clean towel to pat the incision dry following showers. Continue to use ice for pain and swelling after surgery. Do not use any lotions or creams on the incision until instructed by your surgeon.  Wear both TED hose on both legs during the day every day for three weeks, but may have off at night at home.  Postoperative Constipation Protocol  Constipation - defined medically as fewer than three stools per week and severe constipation as less than one stool per week.  One of the most common issues patients have following  surgery is constipation.  Even if you have a regular bowel pattern at home, your normal regimen is likely to be disrupted due to multiple reasons following surgery.  Combination of anesthesia, postoperative narcotics, change in appetite and fluid intake all can affect your bowels.  In order to avoid complications following surgery, here are some recommendations in order to help you during your recovery period.  Colace (docusate) - Pick up an over-the-counter form of Colace or another stool softener and take twice a day as long as you are requiring postoperative pain medications.  Take with a full glass of water daily.  If you experience loose stools or diarrhea, hold the colace until you stool forms back up.  If your symptoms do not get better within 1 week or if they get worse, check with your doctor.  Dulcolax (bisacodyl) - Pick up over-the-counter and take as directed by the product packaging as needed to assist with the movement of your bowels.  Take with a full glass of water.  Use this product as needed if not relieved by Colace only.   MiraLax (polyethylene glycol) - Pick up over-the-counter to have on hand.  MiraLax is a solution that will increase the amount of water in your bowels to assist with bowel movements.  Take as directed and can mix with a glass of water, juice, soda, coffee, or tea.  Take if you go more than two days without a movement. Do not use MiraLax more than once per day. Call your doctor if you are still constipated or irregular after using this medication for 7 days in a row.  If you continue to have problems with postoperative constipation, please contact the office for further assistance and recommendations.  If you experience "the worst abdominal pain ever" or develop nausea or vomiting, please contact the office immediatly for further recommendations for treatment.   Take Xarelto for two and a half more weeks, then discontinue Xarelto. Once the patient has completed the  Xarelto, they may resume the 81 mg Aspirin.   Do not sit on low chairs, stoools or toilet seats, as it may be difficult to get up from low surfaces    Complete by:  As directed    Driving restrictions    Complete by:  As directed    No driving until released by the physician.   Increase activity slowly as tolerated    Complete by:  As directed    Lifting restrictions    Complete by:  As directed    No lifting until released by the physician.   Patient may shower    Complete by:  As directed    You may shower without a dressing once there is no drainage.  Do not wash over the wound.  If  drainage remains, do not shower until drainage stops.   TED hose    Complete by:  As directed    Use stockings (TED hose) for 3 weeks on both leg(s).  You may remove them at night for sleeping.   Weight bearing as tolerated    Complete by:  As directed    Laterality:  left   Extremity:  Lower     Allergies as of 06/02/2016      Reactions   Codeine    REACTION: headaches   Dilaudid [hydromorphone Hcl] Itching   Morphine And Related Itching   Side effect   Penicillins Rash   Has patient had a PCN reaction causing immediate rash, facial/tongue/throat swelling, SOB or lightheadedness with hypotension: Yes Has patient had a PCN reaction causing severe rash involving mucus membranes or skin necrosis: Yes Has patient had a PCN reaction that required hospitalization No Has patient had a PCN reaction occurring within the last 10 years: No If all of the above answers are "NO", then may proceed with Cephalosporin use.   Sulfonamide Derivatives Rash      Medication List    STOP taking these medications   aspirin EC 81 MG tablet   Biotin 10000 MCG Tabs   cholecalciferol 1000 units tablet Commonly known as:  VITAMIN D   CINNAMON PO   Fish Oil 1000 MG Caps   Flaxseed Oil 1200 MG Caps   naproxen sodium 220 MG tablet Commonly known as:  ANAPROX   OCUVITE ADULT 50+ PO   SUPER B COMPLEX PO     vitamin E 400 UNIT capsule     TAKE these medications   ALPRAZolam 1 MG tablet Commonly known as:  XANAX Take 1 tablet (1 mg total) by mouth at bedtime as needed. What changed:  when to take this  additional instructions   ciprofloxacin 500 MG tablet Commonly known as:  CIPRO Take 1 tablet (500 mg total) by mouth 2 (two) times daily.   cyclobenzaprine 10 MG tablet Commonly known as:  FLEXERIL Take 10 mg by mouth at bedtime.   Melatonin 10 MG Caps Take 10 mg by mouth at bedtime.   metFORMIN 500 MG tablet Commonly known as:  GLUCOPHAGE Take 500 mg by mouth at bedtime.   methocarbamol 500 MG tablet Commonly known as:  ROBAXIN Take 1 tablet (500 mg total) by mouth every 6 (six) hours as needed for muscle spasms.   methylphenidate 20 MG tablet Commonly known as:  RITALIN Take 20 mg by mouth 2 (two) times daily.   omeprazole 20 MG capsule Commonly known as:  PRILOSEC TAKE (1) CAPSULE BY MOUTH ONCE DAILY. What changed:  See the new instructions.   oxyCODONE 5 MG immediate release tablet Commonly known as:  Oxy IR/ROXICODONE Take 1-2 tablets (5-10 mg total) by mouth every 4 (four) hours as needed for moderate pain or severe pain.   prazosin 1 MG capsule Commonly known as:  MINIPRESS Take 1 mg by mouth at bedtime.   rivaroxaban 10 MG Tabs tablet Commonly known as:  XARELTO Take 1 tablet (10 mg total) by mouth daily with breakfast. Take Xarelto for two and a half more weeks following discharge from the hospital, then discontinue Xarelto. Once the patient has completed the Xarelto, they may resume the 81 mg Aspirin. Start taking on:  06/03/2016   rosuvastatin 20 MG tablet Commonly known as:  CRESTOR Take 1 tablet (20 mg total) by mouth daily. What changed:  when to take this  venlafaxine XR 150 MG 24 hr capsule Commonly known as:  EFFEXOR-XR Take 150 mg by mouth daily with breakfast.      Follow-up Information    [name], MD. Schedule an appointment as  soon as possible for a visit on 06/16/2016.   Specialty:  Orthopedic Surgery Contact information: [address] [place] [address] [phone]           Signed: Arlee Muslim, PA-C Orthopaedic Surgery 06/02/2016, 8:54 AM

## 2016-06-02 NOTE — Evaluation (Signed)
Physical Therapy Evaluation Patient Details Name: Adrienne [name] MRN: [id number] DOB: Feb 26, 1956 Today's Date: 06/02/2016   History of Present Illness  s/p L DA THA  Clinical Impression  Pt is s/p THA resulting in the deficits listed below (see PT Problem List). * Pt will benefit from skilled PT to increase their independence and safety with mobility to allow discharge to the venue listed below. Will see for second session,  Prior to D/C     Follow Up Recommendations Home health PT (or per MD)    Equipment Recommendations  None recommended by PT    Recommendations for Other Services       Precautions / Restrictions Precautions Precautions: Fall Restrictions Weight Bearing Restrictions: No Other Position/Activity Restrictions: WBAT      Mobility  Bed Mobility Overal bed mobility: Needs Assistance Bed Mobility: Supine to Sit     Supine to sit: Min assist     General bed mobility comments: assist with LLE  Transfers Overall transfer level: Needs assistance Equipment used: Rolling walker (2 wheeled) Transfers: Sit to/from Stand Sit to Stand: Min assist         General transfer comment: cues for hand placement and LLE position  Ambulation/Gait Ambulation/Gait assistance: Min assist;Min guard Ambulation Distance (Feet): 50 Feet Assistive device: Rolling walker (2 wheeled) Gait Pattern/deviations: Step-to pattern;Decreased weight shift to left     General Gait Details: cues for sequence and RW position  Stairs            Wheelchair Mobility    Modified Rankin (Stroke Patients Only)       Balance                                             Pertinent Vitals/Pain Pain Assessment: 0-10 Pain Score: 4  Pain Location: Left hip Pain Descriptors / Indicators: Aching;Sore Pain Intervention(s): Limited activity within patient's tolerance;Monitored during session;Premedicated before session    [place] expects to be  discharged to:: Private residence Living Arrangements: Alone Available Help at Discharge: Available PRN/intermittently Type of Home: House Home Access: Stairs to enter   [employer] of Steps: 2 Home Layout: Two level;1/2 bath on main level (plans to stay downstairs initially) Home Equipment: Shower seat;Cane - single point;Walker - 2 wheels      Prior Function Level of Independence: Independent;Independent with assistive device(s)         Comments: amb with cane since July, RW for 1 wk     Hand Dominance   Dominant Hand: Right    Extremity/Trunk Assessment   Upper Extremity Assessment Upper Extremity Assessment: Overall WFL for tasks assessed    Lower Extremity Assessment Lower Extremity Assessment: LLE deficits/detail LLE Deficits / Details: ankle WFL; knee and hip 2+ to 3/5, limited by anticipated post op pain and weakness       Communication   Communication: No difficulties  Cognition Arousal/Alertness: Awake/alert Behavior During Therapy: WFL for tasks assessed/performed Overall Cognitive Status: Within Functional Limits for tasks assessed                                        General Comments      Exercises Total Joint Exercises Ankle Circles/Pumps: AROM;Both Quad Sets: Both;AROM   Assessment/Plan    PT Assessment Patient  needs continued PT services  PT Problem List Decreased strength;Decreased activity tolerance;Decreased mobility;Pain;Decreased knowledge of use of DME       PT Treatment Interventions DME instruction;Gait training;Stair training;Functional mobility training;Therapeutic activities;Therapeutic exercise;Patient/family education    PT Goals (Current goals can be found in the Care Plan section)  Acute Rehab PT Goals Patient Stated Goal: less pain with mobility PT Goal Formulation: With patient Time For Goal Achievement: 06/05/16 Potential to Achieve Goals: Good    Frequency 7X/week   Barriers to  discharge        Co-evaluation               End of Session Equipment Utilized During Treatment: Gait belt Activity Tolerance: Patient tolerated treatment well Patient left: in chair;with call bell/phone within reach;with chair alarm set   PT Visit Diagnosis: Other abnormalities of gait and mobility (R26.89)    Time: 0177-9390 PT Time Calculation (min) (ACUTE ONLY): 23 min   Charges:   PT Evaluation $PT Eval Low Complexity: 1 Procedure PT Treatments $Gait Training: 8-22 mins   PT G Codes:          Coye Dawood 06-07-2016, 12:17 PM

## 2016-06-12 ENCOUNTER — Other Ambulatory Visit: Payer: Self-pay | Admitting: Nurse Practitioner

## 2016-07-07 ENCOUNTER — Telehealth: Payer: Self-pay

## 2016-07-07 NOTE — Telephone Encounter (Signed)
Called pt to schedule AWV with [name] - anr

## 2016-08-27 ENCOUNTER — Ambulatory Visit (INDEPENDENT_AMBULATORY_CARE_PROVIDER_SITE_OTHER): Payer: Medicare Other | Admitting: Family Medicine

## 2016-08-27 ENCOUNTER — Encounter: Payer: Self-pay | Admitting: Family Medicine

## 2016-08-27 VITALS — BP 122/86 | HR 76 | Temp 98.2°F | Resp 20 | Ht 65.0 in | Wt 183.0 lb

## 2016-08-27 DIAGNOSIS — Z96643 Presence of artificial hip joint, bilateral: Secondary | ICD-10-CM | POA: Insufficient documentation

## 2016-08-27 DIAGNOSIS — F411 Generalized anxiety disorder: Secondary | ICD-10-CM | POA: Diagnosis not present

## 2016-08-27 DIAGNOSIS — K219 Gastro-esophageal reflux disease without esophagitis: Secondary | ICD-10-CM

## 2016-08-27 DIAGNOSIS — E119 Type 2 diabetes mellitus without complications: Secondary | ICD-10-CM

## 2016-08-27 DIAGNOSIS — Z720 Tobacco use: Secondary | ICD-10-CM

## 2016-08-27 NOTE — Patient Instructions (Signed)
Check the medicine at home and make sure you are taking crestor (rosuvastatin) This is for cholesterol and hardening of the arteries  You are due for blood work in September See me after labs  Try to cut down on the smoking Walk every day that you are able  Call sooner for problems

## 2016-08-27 NOTE — Progress Notes (Signed)
Chief Complaint  Patient presents with  . Follow-up    3 month  had her hip replacement and it went well.  Walking without assistance.  Not needing pain management. Pleased with result Has been unable to see her psychiatrist for a couple of months and is our of ritalin.  Takes it TID to "keep going".  Also takes xanax up to 5 times a day for anxiety.  Provided by her long term psychiatrist.  She is very sleepy off of the ritalin and says she sleeps 20/24 hours a day.  Has cut down on smoking. Is uncertain if she is taking crestor so will check her pill bottles when she gets home BP is well controlled GERD asymptomatic on medicine Her diabetes is well controlled with A1cs under 7.  Fasting sugar 99-136   Patient Active Problem List   Diagnosis Date Noted  . Status post bilateral total hip replacement 08/27/2016  . Emphysema of lung ([place]) 05/22/2016  . Atherosclerosis of aorta ([place]) 05/22/2016  . GAD (generalized anxiety disorder) 05/22/2016  . Mucosal abnormality of stomach   . Bipolar 1 disorder ([place]) 05/15/2014  . History of smoking 30 or more pack years 05/15/2014  . Barrett's esophagus 04/03/2010  . SHOULDER, ARTHRITIS, DEGEN./OSTEO 03/06/2009  . RUPTURE ROTATOR CUFF 03/06/2009  . Type 2 diabetes mellitus without complications ([place]) 55/37/4827  . H N P-LUMBAR 06/29/2007  . RESTLESS LEG SYNDROME 05/18/2007  . SLEEP APNEA 05/18/2007  . Headache(784.0) 05/18/2007  . SCIATICA 03/31/2007  . DEPRESSION 03/28/2007  . MACULAR DEGENERATION 03/28/2007  . ALLERGIC RHINITIS 03/28/2007  . GERD 03/28/2007  . LOW BACK PAIN 03/28/2007  . SEIZURE DISORDER 03/28/2007  . HEAD TRAUMA, CLOSED 03/28/2007    Outpatient Encounter Prescriptions as of 08/27/2016  Medication Sig  . ALPRAZolam (XANAX) 1 MG tablet Take 1 tablet (1 mg total) by mouth at bedtime as needed. (Patient taking differently: Take 1 mg by mouth 5 (five) times daily. Sometimes takes four a day , just depends if she needs it  or not)  . cyclobenzaprine (FLEXERIL) 10 MG tablet Take 10 mg by mouth at bedtime.  . Melatonin 10 MG CAPS Take 10 mg by mouth at bedtime.  . metFORMIN (GLUCOPHAGE) 500 MG tablet Take 500 mg by mouth at bedtime.   . methylphenidate (RITALIN) 20 MG tablet Take 20 mg by mouth 3 (three) times daily with meals.   [name] omeprazole (PRILOSEC) 20 MG capsule TAKE (1) CAPSULE BY MOUTH ONCE DAILY.  . prazosin (MINIPRESS) 1 MG capsule Take 1 mg by mouth at bedtime.  . rosuvastatin (CRESTOR) 20 MG tablet Take 1 tablet (20 mg total) by mouth daily.  [name] venlafaxine XR (EFFEXOR-XR) 150 MG 24 hr capsule Take 150 mg by mouth daily with breakfast.   No facility-administered encounter medications on file as of 08/27/2016.     Allergies  Allergen Reactions  . Codeine     REACTION: headaches  . Dilaudid [Hydromorphone Hcl] Itching  . Morphine And Related Itching    Side effect  . Penicillins Rash    Has patient had a PCN reaction causing immediate rash, facial/tongue/throat swelling, SOB or lightheadedness with hypotension: Yes Has patient had a PCN reaction causing severe rash involving mucus membranes or skin necrosis: Yes Has patient had a PCN reaction that required hospitalization No Has patient had a PCN reaction occurring within the last 10 years: No If all of the above answers are "NO", then may proceed with Cephalosporin use.   [name]  Sulfonamide Derivatives Rash    Review of Systems  Constitutional: Positive for fatigue. Negative for activity change, appetite change and unexpected weight change.  HENT: Negative for congestion, dental problem, postnasal drip and rhinorrhea.   Eyes: Negative for redness and visual disturbance.  Respiratory: Negative for cough and shortness of breath.   Cardiovascular: Negative for chest pain, palpitations and leg swelling.  Gastrointestinal: Negative for abdominal pain, constipation and diarrhea.  Genitourinary: Negative for difficulty urinating and frequency.    Musculoskeletal: Negative for arthralgias and back pain.  Neurological: Negative for dizziness and headaches.  Psychiatric/Behavioral: Positive for dysphoric mood and sleep disturbance. The patient is not nervous/anxious.        Too much sleep    BP 122/86 (BP Location: Right Arm, Patient Position: Sitting, Cuff Size: Normal)   Pulse 76   Temp 98.2 F (36.8 C) (Temporal)   Resp 20   Ht 5\' 5"  (1.651 m)   Wt 183 lb 0.3 oz (83 kg)   SpO2 93%   BMI 30.46 kg/m   Physical Exam  Constitutional: She is oriented to person, place, and time. She appears well-developed and well-nourished. No distress.  Appears tired  HENT:  Head: Normocephalic and atraumatic.  Nose: Nose normal.  Mouth/Throat: Oropharynx is clear and moist.  Dentition well restored.   Eyes: Conjunctivae and EOM are normal. Pupils are equal, round, and reactive to light.  Neck: Normal range of motion. No thyromegaly present.  Cardiovascular: Normal rate, regular rhythm, normal heart sounds and intact distal pulses.   Pulmonary/Chest: Effort normal and breath sounds normal. She has no wheezes. She has no rales.  Diminished lung sounds  Abdominal: Soft. Bowel sounds are normal. There is no tenderness.  Musculoskeletal: Normal range of motion. She exhibits no edema.  No limp  Lymphadenopathy:    She has no cervical adenopathy.  Neurological: She is alert and oriented to person, place, and time. She displays normal reflexes.  Psychiatric: She has a normal mood and affect. Her behavior is normal. Thought content normal.    ASSESSMENT/PLAN:  1. Gastroesophageal reflux disease, esophagitis presence not specified *asymtomatic  2. Type 2 diabetes mellitus without complication, without long-term current use of insulin (HCC) controlled  3. Status post bilateral total hip replacement Doing well  4. GAD (generalized anxiety disorder) Not controlled.  Off meds.  Very tired.  5. Tobacco abuse - improved   Patient  Instructions  Check the medicine at home and make sure you are taking crestor (rosuvastatin) This is for cholesterol and hardening of the arteries  You are due for blood work in September See me after labs  Try to cut down on the smoking Walk every day that you are able  Call sooner for problems    Raylene Everts, MD

## 2016-11-14 ENCOUNTER — Other Ambulatory Visit: Payer: Self-pay | Admitting: Gastroenterology

## 2016-11-17 ENCOUNTER — Encounter: Payer: Self-pay | Admitting: Gastroenterology

## 2016-11-17 NOTE — Telephone Encounter (Signed)
Please tell the patient I can provide refills for a couple months but it appears she has not been seen by our office in almost 2 years. She will need a follow-up visit for further refills.

## 2016-11-17 NOTE — Telephone Encounter (Signed)
Please schedule ov.  

## 2016-12-07 ENCOUNTER — Telehealth: Payer: Self-pay | Admitting: Family Medicine

## 2016-12-07 NOTE — Telephone Encounter (Signed)
Patient left message on voice mail @ 1:45  to return call (no detail)   Returned call left message on voice mail for patient to call back

## 2016-12-08 ENCOUNTER — Telehealth: Payer: Self-pay | Admitting: Family Medicine

## 2016-12-08 ENCOUNTER — Telehealth: Payer: Self-pay

## 2016-12-08 DIAGNOSIS — E119 Type 2 diabetes mellitus without complications: Secondary | ICD-10-CM

## 2016-12-08 NOTE — Telephone Encounter (Signed)
Spoke to Adrienne [name], states she has increased her metformin to 1000mg  twice daily and her blood sugars are running between 200- 300. Is going to do labs tomorrow, and has an appt for Thursday.

## 2016-12-08 NOTE — Telephone Encounter (Signed)
Patient calling because her sugar is high and needs appt with Dr. Meda Coffee. While on the phone trying to make an appt for her, she requested that I return the call later because she was on the other line and she's asked me to call her back later.

## 2016-12-08 NOTE — Telephone Encounter (Signed)
See note

## 2016-12-08 NOTE — Telephone Encounter (Signed)
Patient has called back requesting appt with Dr. Meda Coffee this week. States her sugar spiked to 300 and she thinks that it could be the medications Crestor and Metformin.  She states she has modified her dosage.  Should she be worked in?

## 2016-12-10 ENCOUNTER — Encounter: Payer: Self-pay | Admitting: Family Medicine

## 2016-12-10 ENCOUNTER — Ambulatory Visit (INDEPENDENT_AMBULATORY_CARE_PROVIDER_SITE_OTHER): Payer: Medicare Other | Admitting: Family Medicine

## 2016-12-10 VITALS — BP 174/90 | HR 80 | Temp 98.4°F | Resp 20 | Ht 65.0 in | Wt 199.0 lb

## 2016-12-10 DIAGNOSIS — Z23 Encounter for immunization: Secondary | ICD-10-CM

## 2016-12-10 DIAGNOSIS — E119 Type 2 diabetes mellitus without complications: Secondary | ICD-10-CM | POA: Diagnosis not present

## 2016-12-10 LAB — URINALYSIS, ROUTINE W REFLEX MICROSCOPIC
BACTERIA UA: NONE SEEN /HPF
Bilirubin Urine: NEGATIVE
Glucose, UA: NEGATIVE
HGB URINE DIPSTICK: NEGATIVE
HYALINE CAST: NONE SEEN /LPF
KETONES UR: NEGATIVE
NITRITE: NEGATIVE
PH: 6 (ref 5.0–8.0)
Protein, ur: NEGATIVE
RBC / HPF: NONE SEEN /HPF (ref 0–2)
SPECIFIC GRAVITY, URINE: 1.011 (ref 1.001–1.03)
WBC, UA: NONE SEEN /HPF (ref 0–5)

## 2016-12-10 LAB — MICROALBUMIN / CREATININE URINE RATIO
CREATININE, URINE: 52 mg/dL (ref 20–275)
MICROALB UR: 0.5 mg/dL
Microalb Creat Ratio: 10 mcg/mg creat (ref <30)

## 2016-12-10 LAB — LIPID PANEL
CHOL/HDL RATIO: 1.7 (calc) (ref <5.0)
Cholesterol: 91 mg/dL (ref <200)
HDL: 53 mg/dL (ref >50)
LDL CHOLESTEROL (CALC): 21 mg/dL
NON-HDL CHOLESTEROL (CALC): 38 mg/dL (ref <130)
Triglycerides: 90 mg/dL (ref <150)

## 2016-12-10 LAB — CBC
HEMATOCRIT: 41.2 % (ref 35.0–45.0)
HEMOGLOBIN: 13.9 g/dL (ref 11.7–15.5)
MCH: 30.5 pg (ref 27.0–33.0)
MCHC: 33.7 g/dL (ref 32.0–36.0)
MCV: 90.5 fL (ref 80.0–100.0)
MPV: 10.2 fL (ref 7.5–12.5)
Platelets: 219 10*3/uL (ref 140–400)
RBC: 4.55 10*6/uL (ref 3.80–5.10)
RDW: 13.1 % (ref 11.0–15.0)
WBC: 9.2 10*3/uL (ref 3.8–10.8)

## 2016-12-10 LAB — COMPLETE METABOLIC PANEL WITH GFR
AG Ratio: 1.7 (calc) (ref 1.0–2.5)
ALBUMIN MSPROF: 3.9 g/dL (ref 3.6–5.1)
ALT: 14 U/L (ref 6–29)
AST: 17 U/L (ref 10–35)
Alkaline phosphatase (APISO): 57 U/L (ref 33–130)
BUN: 11 mg/dL (ref 7–25)
CALCIUM: 8.9 mg/dL (ref 8.6–10.4)
CO2: 29 mmol/L (ref 20–32)
CREATININE: 0.75 mg/dL (ref 0.50–0.99)
Chloride: 105 mmol/L (ref 98–110)
GFR, EST NON AFRICAN AMERICAN: 85 mL/min/{1.73_m2} (ref >=60)
GFR, Est African American: 99 mL/min/{1.73_m2} (ref >=60)
GLOBULIN: 2.3 g/dL (ref 1.9–3.7)
GLUCOSE: 153 mg/dL — AB (ref 65–99)
Potassium: 4.6 mmol/L (ref 3.5–5.3)
SODIUM: 140 mmol/L (ref 135–146)
TOTAL PROTEIN: 6.2 g/dL (ref 6.1–8.1)
Total Bilirubin: 0.4 mg/dL (ref 0.2–1.2)

## 2016-12-10 LAB — HEMOGLOBIN A1C
HEMOGLOBIN A1C: 8.7 %{Hb} — AB (ref <5.7)
Mean Plasma Glucose: 203 (calc)
eAG (mmol/L): 11.2 (calc)

## 2016-12-10 MED ORDER — METFORMIN HCL 1000 MG PO TABS: 1000.0000 mg | ORAL_TABLET | Freq: Two times a day (BID) | ORAL | 3 refills | Status: DC

## 2016-12-10 NOTE — Patient Instructions (Signed)
Continue the metformin twice a day I recommend a diabetes educator exercise every day that you are able Low carbohydrate diet  See me in 3 months Need A1c next time before visit

## 2016-12-10 NOTE — Progress Notes (Signed)
Chief Complaint  Patient presents with  . Diabetes   Here for follow up prior to scheduled appt She has diabetes which has traditionally been well controlled Her last A1c was 6.2 She called to report increased sugars and was asked to come in. A review today of her eating habits shows very poor knowledge of diabetic eating recommendations.  She has a dry mouth to sucks on popsicles.  She eats cocoa puff cereal with marshmallows.  She eats pasta almost every day.  Has one main meal a day.  Works in her yard, with no reg exercise.  Eats dinner at 10-11 at night.  I am sending her to a diabetes educator. Her A1c Is up to 8.7 today.  We discussed what this means. She is committed to a better diet.    Patient Active Problem List   Diagnosis Date Noted  . Status post bilateral total hip replacement 08/27/2016  . Emphysema of lung ([place]) 05/22/2016  . Atherosclerosis of aorta ([place]) 05/22/2016  . GAD (generalized anxiety disorder) 05/22/2016  . Mucosal abnormality of stomach   . Bipolar 1 disorder ([place]) 05/15/2014  . History of smoking 30 or more pack years 05/15/2014  . Barrett's esophagus 04/03/2010  . SHOULDER, ARTHRITIS, DEGEN./OSTEO 03/06/2009  . RUPTURE ROTATOR CUFF 03/06/2009  . Type 2 diabetes mellitus without complications ([place]) 97/67/3419  . H N P-LUMBAR 06/29/2007  . RESTLESS LEG SYNDROME 05/18/2007  . SLEEP APNEA 05/18/2007  . Headache(784.0) 05/18/2007  . SCIATICA 03/31/2007  . DEPRESSION 03/28/2007  . MACULAR DEGENERATION 03/28/2007  . ALLERGIC RHINITIS 03/28/2007  . GERD 03/28/2007  . LOW BACK PAIN 03/28/2007  . SEIZURE DISORDER 03/28/2007  . HEAD TRAUMA, CLOSED 03/28/2007    Outpatient Encounter Prescriptions as of 12/10/2016  Medication Sig  . ALPRAZolam (XANAX) 1 MG tablet Take 1 tablet (1 mg total) by mouth at bedtime as needed. (Patient taking differently: Take 1 mg by mouth 5 (five) times daily. Sometimes takes four a day , just depends if she needs it or not)    . cyclobenzaprine (FLEXERIL) 10 MG tablet Take 10 mg by mouth at bedtime.  . Melatonin 10 MG CAPS Take 10 mg by mouth at bedtime.  . methylphenidate (RITALIN) 20 MG tablet Take 20 mg by mouth 3 (three) times daily with meals.   [name] omeprazole (PRILOSEC) 20 MG capsule TAKE (1) CAPSULE BY MOUTH ONCE DAILY.  . prazosin (MINIPRESS) 1 MG capsule Take 1 mg by mouth at bedtime.  . rosuvastatin (CRESTOR) 20 MG tablet Take 1 tablet (20 mg total) by mouth daily.  [name] venlafaxine XR (EFFEXOR-XR) 150 MG 24 hr capsule Take 150 mg by mouth daily with breakfast.  . metFORMIN (GLUCOPHAGE) 1000 MG tablet Take 1 tablet (1,000 mg total) by mouth 2 (two) times daily with a meal.   No facility-administered encounter medications on file as of 12/10/2016.     Allergies  Allergen Reactions  . Codeine     REACTION: headaches  . Dilaudid [Hydromorphone Hcl] Itching  . Morphine And Related Itching    Side effect  . Penicillins Rash    Has patient had a PCN reaction causing immediate rash, facial/tongue/throat swelling, SOB or lightheadedness with hypotension: Yes Has patient had a PCN reaction causing severe rash involving mucus membranes or skin necrosis: Yes Has patient had a PCN reaction that required hospitalization No Has patient had a PCN reaction occurring within the last 10 years: No If all of the above answers are "NO", then may  proceed with Cephalosporin use.   . Sulfonamide Derivatives Rash    Review of Systems  Constitutional: Positive for fatigue. Negative for activity change, appetite change and unexpected weight change.  HENT: Negative for congestion, dental problem, postnasal drip and rhinorrhea.   Eyes: Negative for redness and visual disturbance.  Respiratory: Negative for cough and shortness of breath.   Cardiovascular: Negative for chest pain, palpitations and leg swelling.  Gastrointestinal: Negative for abdominal pain, constipation and diarrhea.  Endocrine: Negative for polydipsia and  polyuria.  Genitourinary: Negative for difficulty urinating and frequency.  Musculoskeletal: Negative for arthralgias and back pain.  Neurological: Negative for dizziness and headaches.  Psychiatric/Behavioral: Positive for dysphoric mood and sleep disturbance. The patient is nervous/anxious.        Sleeps a lot, gets up at 11, naps    BP (!) 174/90 (BP Location: Right Arm, Patient Position: Sitting, Cuff Size: Normal)   Pulse 80   Temp 98.4 F (36.9 C) (Temporal)   Resp 20   Ht 5\' 5"  (1.651 m)   Wt 199 lb (90.3 kg)   SpO2 93%   BMI 33.12 kg/m   Physical Exam  Constitutional: She is oriented to person, place, and time. She appears well-developed and well-nourished. No distress.  Appears tired  HENT:  Head: Normocephalic and atraumatic.  Nose: Nose normal.  Mouth/Throat: Oropharynx is clear and moist.  Dentition well restored.   Eyes: Pupils are equal, round, and reactive to light. Conjunctivae and EOM are normal.  Neck: Normal range of motion. No thyromegaly present.  Cardiovascular: Normal rate, regular rhythm, normal heart sounds and intact distal pulses.   Pulmonary/Chest: Effort normal and breath sounds normal. She has no wheezes. She has no rales.  Diminished lung sounds  Abdominal: Soft. Bowel sounds are normal. There is no tenderness.  Musculoskeletal: Normal range of motion. She exhibits no edema.  Lymphadenopathy:    She has no cervical adenopathy.  Neurological: She is alert and oriented to person, place, and time. She displays normal reflexes.  Psychiatric: She has a normal mood and affect. Her behavior is normal. Thought content normal.    ASSESSMENT/PLAN:  1. Need for influenza vaccination - Flu Vaccine QUAD 36+ mos IM  2. Need for pneumococcal vaccination - Pneumococcal conjugate vaccine 13-valent IM  3. Type 2 diabetes mellitus without complication, without long-term current use of insulin (HCC) - Ambulatory referral to Nutrition and Diabetic  Education Discussed diet and exercise changes, proper medicine  Greater than 50% of this visit was spent in counseling and coordinating care.  Total face to face time: 25 min discussing diabetes self care, diet, exercise, carbohydrates     Patient Instructions  Continue the metformin twice a day I recommend a diabetes educator exercise every day that you are able Low carbohydrate diet  See me in 3 months Need A1c next time before visit   Raylene Everts, MD

## 2017-01-04 ENCOUNTER — Ambulatory Visit: Payer: Medicare Other | Admitting: Gastroenterology

## 2017-01-04 ENCOUNTER — Telehealth: Payer: Self-pay | Admitting: Gastroenterology

## 2017-01-04 ENCOUNTER — Encounter: Payer: Self-pay | Admitting: Gastroenterology

## 2017-01-04 NOTE — Telephone Encounter (Signed)
Patient was a no show and letter sent  °

## 2017-01-12 ENCOUNTER — Other Ambulatory Visit: Payer: Self-pay | Admitting: Nurse Practitioner

## 2017-01-14 NOTE — Telephone Encounter (Signed)
Noted Lmom, waiting on a return call.  

## 2017-01-14 NOTE — Telephone Encounter (Signed)
Patient missed her appt for refills. One refill and after that we will not be able to continue refills without having updated OV. Last OV 2016.

## 2017-01-18 NOTE — Telephone Encounter (Signed)
Closing note out. Letter was mailed to pt concerning her missed appointment.

## 2017-02-10 ENCOUNTER — Other Ambulatory Visit: Payer: Self-pay | Admitting: Family Medicine

## 2017-02-11 NOTE — Telephone Encounter (Signed)
Seen 10 [phone] 

## 2017-03-13 ENCOUNTER — Other Ambulatory Visit: Payer: Self-pay | Admitting: Gastroenterology

## 2017-05-17 ENCOUNTER — Encounter: Payer: Self-pay | Admitting: Family Medicine

## 2017-07-07 ENCOUNTER — Telehealth: Payer: Self-pay | Admitting: Family Medicine

## 2017-07-07 NOTE — Telephone Encounter (Signed)
RETURNED CALL TO [phone]/ [phone]  (DR.NELSON PATIENT) NO ANSWER, LVM

## 2017-07-08 ENCOUNTER — Ambulatory Visit: Payer: Medicare Other | Admitting: Gastroenterology

## 2017-07-08 ENCOUNTER — Encounter: Payer: Self-pay | Admitting: Gastroenterology

## 2017-07-08 VITALS — BP 156/91 | HR 94 | Temp 97.1°F | Ht 65.0 in | Wt 193.2 lb

## 2017-07-08 DIAGNOSIS — K2270 Barrett's esophagus without dysplasia: Secondary | ICD-10-CM

## 2017-07-08 DIAGNOSIS — R151 Fecal smearing: Secondary | ICD-10-CM | POA: Diagnosis not present

## 2017-07-08 MED ORDER — OMEPRAZOLE 20 MG PO CPDR: 20.0000 mg | DELAYED_RELEASE_CAPSULE | Freq: Two times a day (BID) | ORAL | 3 refills | Status: DC

## 2017-07-08 MED ORDER — HYDROCORTISONE 2.5 % RE CREA: 1.0000 "application " | TOPICAL_CREAM | Freq: Two times a day (BID) | RECTAL | 1 refills | Status: DC

## 2017-07-08 NOTE — Progress Notes (Signed)
Primary Care Physician:  Patient, No Pcp Per Primary Gastroenterologist:  Dr. Gala Romney   Chief Complaint  Patient presents with  . Bloated  . change in bowels    "comes out without knowing it" about 2-3 times per week  . Emesis    2-3 times per week for the past 2 weeks    HPI:   Adrienne [name] is a 62 y.o. female presenting today with history of Barrett's esophagus (dating back to at least 2004), last EGD in 2016 with Barrett's and antral erosions. Last colonoscopy 2016 with internal hemorrhoids and redundant colon, no prior adenomas. Here for routine follow-up.  Will be doing anything (working in house, working outside), come inside and go to the bathroom and notice that she is incontinent of stool. Sees her underwear. No pressure. Sometimes a little ball about the size of a marble. Sometimes will have a little marble while urinating simultaneously. Denies constipation otherwise. Feels they are "good" BMs. No rectal bleeding. Occasional rectal discomfort.   Also notes intermittent vomiting for past few weeks. Starts getting a watery feeling in mouth. Keeps a metal bucket by her bed. Threw up three times this week. Prilosec 20 mg daily. Drank a diet drink and 20 minutes later it came back up. Doesn't eat in the mornings. Only eats at night. Has had trouble controlling her diabetes. Doesn't feel nauseated. No dysphagia. Has a tendency to overeat. Has gained weight recently.   Past Medical History:  Diagnosis Date  . Allergic rhinitis   . Allergy   . Anemia    leukocytosis/thrombocytosis--Dr. Tressie Stalker  . Anxiety   . Arthritis   . Asthma   . 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 Closed head injury 2005  . COPD (chronic obstructive pulmonary disease) ([place])   . Depression    ? Bipolar Disorder--Dr, Jimmye Norman  . Emphysema of lung ([place])   . GERD (gastroesophageal reflux disease)    Barrett's esophagus  . Headache    migraines  . Hx of migraines   . Hypertension   . Low back pain    . Macular degeneration, bilateral    Dry-Dr. Iona Hansen  . Neuromuscular disorder ([place])   . NIDDM (non-insulin dependent diabetes mellitus)   . OSA (obstructive sleep apnea)   . RLS (restless legs syndrome)   . Seizure disorder ([place])    ? absence  . Seizures ([place])   . Sleep apnea   . Status post right hip replacement   . Status post rotator cuff surgery     Past Surgical History:  Procedure Laterality Date  . ABDOMINAL HYSTERECTOMY     heavy bleeding  . APPENDECTOMY    . BIOPSY  12/27/2014   Procedure: Esophageal BIOPSY;  Surgeon: Daneil Dolin, MD;  Location: [facility];  Service: Endoscopy;;  . COLONOSCOPY WITH ESOPHAGOGASTRODUODENOSCOPY (EGD)  12/08   TCS/EGD: + Barrett's,+ gastritis, friable anal canal, hyperplastic polyp  . COLONOSCOPY WITH PROPOFOL N/A 12/27/2014   RMR: Internal hemorrhoids likely source of hematochezia. Redundant colon.   . ESOPHAGOGASTRODUODENOSCOPY  11/16/2002   Dr.Rourk- salmon-colored epithelium in the upper esophageal sphincter previous bx= ectopic gastric mucosa, salmon colored epithelium at the distal esophagus proviously bx= barretts esophagus o/w the esophageal mucosa appeared normal. patulous esophagogastric junction, small hiatal hernia, gastric mucosa o/w appeared normal. normal D1 and D2  . ESOPHAGOGASTRODUODENOSCOPY (EGD) WITH PROPOFOL N/A 12/27/2014   RMR: Abnoraml distal esophagus consistiant with prior diagnosis fo Barretts esophagus- Status post biopsy. Hiatal  hernia. Antral erosions status post gastric biopsy,  . FRACTURE SURGERY Left 03/07/2013   knee  . fx left knee Left 2014  . ORIF TIBIA PLATEAU Left 03/07/2013   Procedure: OPEN REDUCTION INTERNAL FIXATION (ORIF) TIBIAL PLATEAU;  Surgeon: Renette Butters, MD;  Location: [facility];  Service: Orthopedics;  Laterality: Left;  . REVISION TOTAL HIP ARTHROPLASTY     Right  . ROTATOR CUFF REPAIR     Bilaterally  . SHOULDER INJECTION Left 03/07/2013   Procedure: SHOULDER INJECTION ; Depomedrol +  0.25% Marcaine mixture 4:1;  Surgeon: Renette Butters, MD;  Location: [facility];  Service: Orthopedics;  Laterality: Left;  . TONSILLECTOMY    . TOTAL HIP ARTHROPLASTY Left 06/01/2016   Procedure: LEFT TOTAL HIP ARTHROPLASTY ANTERIOR APPROACH;  Surgeon: Gaynelle Arabian, MD;  Location: [facility];  Service: Orthopedics;  Laterality: Left;  [name] VESICOVAGINAL FISTULA CLOSURE W/ TAH      Current Outpatient Medications  Medication Sig Dispense Refill  . ALPRAZolam (XANAX) 1 MG tablet Take 1 tablet (1 mg total) by mouth at bedtime as needed. (Patient taking differently: Take 1 mg by mouth 5 (five) times daily. Sometimes takes four a day , just depends if she needs it or not) 30 tablet 0  . aspirin 81 MG tablet Take 81 mg by mouth daily.    . B Complex-C (SUPER B COMPLEX PO) Take 1 tablet by mouth daily.    [name] BIOTIN PO Take 10,000 mcg by mouth daily.    . Cholecalciferol (D3-1000 PO) Take 1 tablet by mouth daily.    [name] CINNAMON PO Take 1,000 mg by mouth daily.    . cyclobenzaprine (FLEXERIL) 10 MG tablet Take 10 mg by mouth at bedtime.    . Flaxseed, Linseed, (FLAX SEED OIL PO) Take 12,000 mg by mouth daily.    [name] lithium carbonate (LITHOBID) 300 MG CR tablet Take 1 tablet by mouth daily.    . Melatonin 10 MG CAPS Take 20 mg by mouth at bedtime.     . metFORMIN (GLUCOPHAGE) 1000 MG tablet Take 1 tablet (1,000 mg total) by mouth 2 (two) times daily with a meal. (Patient taking differently: Take 500 mg by mouth 2 (two) times daily with a meal. ) 180 tablet 3  . methylphenidate (RITALIN) 20 MG tablet Take 20 mg by mouth 3 (three) times daily with meals.     . multivitamin-lutein (OCUVITE-LUTEIN) CAPS capsule Take 1 capsule by mouth daily.    . naproxen sodium (ALEVE) 220 MG tablet 1 tablet 3 times a day and 2 at bedtime    . Omega-3 Fatty Acids (FISH OIL) 1000 MG CAPS Take by mouth daily.    [name] omeprazole (PRILOSEC) 20 MG capsule TAKE (1) CAPSULE BY MOUTH ONCE DAILY. 30 capsule 0  . prazosin (MINIPRESS) 1 MG capsule  Take 1 mg by mouth at bedtime.    . rosuvastatin (CRESTOR) 20 MG tablet TAKE (1) TABLET BY MOUTH ONCE DAILY. 90 tablet 3  . venlafaxine XR (EFFEXOR-XR) 150 MG 24 hr capsule Take 150 mg by mouth daily with breakfast.    . vitamin E 400 UNIT capsule Take 400 Units by mouth daily.     No current facility-administered medications for this visit.     Allergies as of 07/08/2017 - Review Complete 07/08/2017  Allergen Reaction Noted  . Codeine    . Dilaudid [hydromorphone hcl] Itching 05/23/2013  . Morphine and related Itching 03/02/2013  . Penicillins Rash   . Sulfonamide derivatives Rash  Family History  Problem Relation Age of Onset  . Heart disease Father 29  . Atrial fibrillation Mother   . Cancer Mother        carcinoid  . Diabetes Maternal Grandfather   . Cancer Maternal Grandfather   . Heart disease Maternal Grandmother   . Thyroid disease Son     Social History   Socioeconomic History  . Marital status: Divorced    Spouse name: Not on file  . Number of children: 3  . Years of education: 45  . Highest education level: Not on file  Occupational History  . Occupation: on disability    Employer: UNEMPLOYED    Comment: due to head injury, 2005  Social Needs  . Financial resource strain: Not on file  . Food insecurity:    Worry: Not on file    Inability: Not on file  . Transportation needs:    Medical: Not on file    Non-medical: Not on file  Tobacco Use  . Smoking status: Current Every Day Smoker    Packs/day: 0.50    Years: 40.00    Pack years: 20.00    Types: Cigarettes    Start date: 03/03/1971  . Smokeless tobacco: Never Used  Substance and Sexual Activity  . Alcohol use: No  . Drug use: No  . Sexual activity: Yes    Birth control/protection: Surgical  Lifestyle  . Physical activity:    Days per week: Not on file    Minutes per session: Not on file  . Stress: Not on file  Relationships  . Social connections:    Talks on phone: Not on file     Gets together: Not on file    Attends religious service: Not on file    Active member of club or organization: Not on file    Attends meetings of clubs or organizations: Not on file    Relationship status: Not on file  . Intimate partner violence:    Fear of current or ex partner: Not on file    Emotionally abused: Not on file    Physically abused: Not on file    Forced sexual activity: Not on file  Other Topics Concern  . Not on file  Social History Narrative   Lives alone   Two big dogs   Enjoys yard work, home improvement    Review of Systems: Gen: Denies any fever, chills, fatigue, weight loss, lack of appetite.  CV: Denies chest pain, heart palpitations, peripheral edema, syncope.  Resp: Denies shortness of breath at rest or with exertion. Denies wheezing or cough.  GI: see HPI  GU : Denies urinary burning, urinary frequency, urinary hesitancy MS: Denies joint pain, muscle weakness, cramps, or limitation of movement.  Derm: Denies rash, itching, dry skin Psych: Denies depression, anxiety, memory loss, and confusion Heme: Denies bruising, bleeding, and enlarged lymph nodes.  Physical Exam: BP (!) 156/91   Pulse 94   Temp (!) 97.1 F (36.2 C) (Oral)   Ht 5\' 5"  (1.651 m)   Wt 193 lb 3.2 oz (87.6 kg)   BMI 32.15 kg/m  General:   Alert and oriented. Pleasant and cooperative. Well-nourished and well-developed.  Head:  Normocephalic and atraumatic. Eyes:  Without icterus, sclera clear and conjunctiva pink.  Ears:  Normal auditory acuity. Nose:  No deformity, discharge,  or lesions. Mouth:  No deformity or lesions, oral mucosa pink.  Lungs:  Clear to auscultation bilaterally. No wheezes, rales, or rhonchi. No distress.  Heart:  S1, S2 present without murmurs appreciated.  Abdomen:  +BS, soft, non-tender and non-distended. No HSM noted. No guarding or rebound. No masses appreciated.  Rectal:  Deferred  Msk:  Symmetrical without gross deformities. Normal  posture. Extremities:  Without  edema. Neurologic:  Alert and  oriented x4 Psych:  Alert and cooperative. Normal mood and affect.

## 2017-07-08 NOTE — Patient Instructions (Addendum)
I have increased Prilosec to twice a day 30 minutes before breakfast and dinner. It works best when taken on an empty stomach. Make sure to eat small meals, do not eat within 3 hours of laying down, and continue to make those dietary changes.  I recommend Benefiber or Metamucil daily as supplementation. I sent in Anusol cream to use per rectum as needed.  We will see you in 3 months! Let me know if things don't get better. We will arrange an upper endoscopy at the next visit or sooner if needed due to your history of Barrett's.  It was a pleasure to see you today. I strive to create trusting relationships with patients to provide genuine, compassionate, and quality care. I value your feedback. If you receive a survey regarding your visit,  I greatly appreciate you taking time to fill this out.   Annitta Needs, PhD, ANP-BC [facility]    Gastroesophageal Reflux Disease, Adult Normally, food travels down the esophagus and stays in the stomach to be digested. If a person has gastroesophageal reflux disease (GERD), food and stomach acid move back up into the esophagus. When this happens, the esophagus becomes sore and swollen (inflamed). Over time, GERD can make small holes (ulcers) in the lining of the esophagus. Follow these instructions at home: Diet  Follow a diet as told by your doctor. You may need to avoid foods and drinks such as: ? Coffee and tea (with or without caffeine). ? Drinks that contain alcohol. ? Energy drinks and sports drinks. ? Carbonated drinks or sodas. ? Chocolate and cocoa. ? Peppermint and mint flavorings. ? Garlic and onions. ? Horseradish. ? Spicy and acidic foods, such as peppers, chili powder, curry powder, vinegar, hot sauces, and BBQ sauce. ? Citrus fruit juices and citrus fruits, such as oranges, lemons, and limes. ? Tomato-based foods, such as red sauce, chili, salsa, and pizza with red sauce. ? Fried and fatty foods, such as donuts, french  fries, potato chips, and high-fat dressings. ? High-fat meats, such as hot dogs, rib eye steak, sausage, ham, and bacon. ? High-fat dairy items, such as whole milk, butter, and cream cheese.  Eat small meals often. Avoid eating large meals.  Avoid drinking large amounts of liquid with your meals.  Avoid eating meals during the 2-3 hours before bedtime.  Avoid lying down right after you eat.  Do not exercise right after you eat. General instructions  Pay attention to any changes in your symptoms.  Take over-the-counter and prescription medicines only as told by your doctor. Do not take aspirin, ibuprofen, or other NSAIDs unless your doctor says it is okay.  Do not use any tobacco products, including cigarettes, chewing tobacco, and e-cigarettes. If you need help quitting, ask your doctor.  Wear loose clothes. Do not wear anything tight around your waist.  Raise (elevate) the head of your bed about 6 inches (15 cm).  Try to lower your stress. If you need help doing this, ask your doctor.  If you are overweight, lose an amount of weight that is healthy for you. Ask your doctor about a safe weight loss goal.  Keep all follow-up visits as told by your doctor. This is important. Contact a doctor if:  You have new symptoms.  You lose weight and you do not know why it is happening.  You have trouble swallowing, or it hurts to swallow.  You have wheezing or a cough that keeps happening.  Your symptoms do not get  better with treatment.  You have a hoarse voice. Get help right away if:  You have pain in your arms, neck, jaw, teeth, or back.  You feel sweaty, dizzy, or light-headed.  You have chest pain or shortness of breath.  You throw up (vomit) and your throw up looks like blood or coffee grounds.  You pass out (faint).  Your poop (stool) is bloody or black.  You cannot swallow, drink, or eat. This information is not intended to replace advice given to you by your  health care provider. Make sure you discuss any questions you have with your health care provider. Document Released: 08/12/2007 Document Revised: 08/01/2015 Document Reviewed: 06/20/2014 Elsevier Interactive Patient Education  [employer].

## 2017-07-11 NOTE — Assessment & Plan Note (Signed)
Without diarrhea. Denying constipation but reports small "balls" intermittently. No rectal discomfort. Last colonoscopy 2016 with internal hemorrhoids, redundant colon. Add fiber to diet. Add Anusol cream prn rectal discomfort in setting of hemorrhoids. Return in 3 months or sooner if symptoms do no timprove.

## 2017-07-11 NOTE — Assessment & Plan Note (Signed)
62 year old female with history of Barrett's, due for surveillance later this year. Notes intermittent vomiting without nausea, no dysphagia, which is likely related to uncontrolled GERD in setting of increased weight, poor control of diabetes. Unable to exclude delayed gastric emptying playing a role. Less likely stricture. Will increase PPI to BID for short course and focus on both dietary and behavior modifications strictly. If any alarm symptoms, will need EGD; otherwise, will plan on EGD in next several months. She has already started to employ dietary changes and states understanding regarding need for weight loss. Return in 3 months.

## 2017-07-12 NOTE — Progress Notes (Signed)
No pcp per patient 

## 2017-10-07 ENCOUNTER — Other Ambulatory Visit: Payer: Self-pay | Admitting: Family Medicine

## 2017-10-07 ENCOUNTER — Other Ambulatory Visit: Payer: Self-pay | Admitting: Gastroenterology

## 2017-10-12 ENCOUNTER — Ambulatory Visit: Payer: Medicare Other | Admitting: Gastroenterology

## 2017-11-25 ENCOUNTER — Encounter: Payer: Self-pay | Admitting: *Deleted

## 2017-11-25 ENCOUNTER — Ambulatory Visit: Payer: Medicare Other | Admitting: Gastroenterology

## 2017-11-25 ENCOUNTER — Encounter: Payer: Self-pay | Admitting: Gastroenterology

## 2017-11-25 VITALS — BP 145/86 | HR 82 | Temp 97.1°F | Ht 65.0 in | Wt 190.8 lb

## 2017-11-25 DIAGNOSIS — R197 Diarrhea, unspecified: Secondary | ICD-10-CM

## 2017-11-25 DIAGNOSIS — R112 Nausea with vomiting, unspecified: Secondary | ICD-10-CM | POA: Insufficient documentation

## 2017-11-25 DIAGNOSIS — K2270 Barrett's esophagus without dysplasia: Secondary | ICD-10-CM | POA: Diagnosis not present

## 2017-11-25 DIAGNOSIS — G43A0 Cyclical vomiting, not intractable: Secondary | ICD-10-CM

## 2017-11-25 DIAGNOSIS — R140 Abdominal distension (gaseous): Secondary | ICD-10-CM

## 2017-11-25 DIAGNOSIS — E119 Type 2 diabetes mellitus without complications: Secondary | ICD-10-CM

## 2017-11-25 DIAGNOSIS — R1115 Cyclical vomiting syndrome unrelated to migraine: Secondary | ICD-10-CM

## 2017-11-25 NOTE — Patient Instructions (Signed)
1. Please have your labs done today or tomorrow.  2. CT scan of your abdomen as planned.  3. We will touch base with you after results are available to determine next step. At minimum you will need upper endoscopy this year to follow up on Barrett's.

## 2017-11-25 NOTE — Progress Notes (Signed)
Primary Care Physician:  Patient, No Pcp Per  Primary Gastroenterologist:  Garfield Cornea, MD   Chief Complaint  Patient presents with  . barrett's  . Nausea    vomiting. Happens about 3 times a week  . Diarrhea    wearing "pads" now.   Cyd Silence    HPI:  AZLYN WINGLER is a 62 y.o. female here for follow up N/V/D, abd bloating and due for EGD for Barrett's surveillance. She was seen last in 07/2017. She is accompanied by her mother, Bryson Dames, who is also a patient here.   Patient's last colonoscopy was in 2016 with internal hemorrhoids and redundant colon.  Last EGD in 2016 with Barrett's and antral erosions.  Due for Barrett's surveillance at this time.  For the past 3 to 4 months she has had abdominal bloating and distention.  Mother very concerned about the size of her stomach.  She notes that she has not had a PCP in nearly a year and is not sure how well she is doing with her diabetes management.  She checks her sugars every few weeks but states the PCP told her that this was okay.  On her own she increased her metformin from 500 mg twice daily to 1000 mg twice daily recently.  Several times a week she has nausea and vomiting.  Can occur anytime throughout the day.  Not necessarily related to meals.  No hematemesis.  She has a bowel movement almost every morning.'s are usually soft.  Throughout the day when she goes urinate sometimes she will find stool in the toilet or on the tissue not realizing she had had a bowel movement.  She has had some episodes of incontinence, being unaware of stool passage.  She has to wear feminine pad to protect her undergarments.  She also has periods of significant diarrhea with urgency.  This occurs at least once per week.  No melena or rectal bleeding.  All of her liquids either have artificial sweeteners or unsweetened.  She does consume half a gallon of milk per week.  She states she also drinks quite a bit of buttermilk.  She admits that she does not  cook.  Sometimes she eats leftovers her mother gives her.  Often she eats something simple like a mayonnaise sandwich.  She denies abdominal pain.         Current Outpatient Medications  Medication Sig Dispense Refill  . ALPRAZolam (XANAX) 1 MG tablet Take 1 tablet (1 mg total) by mouth at bedtime as needed. (Patient taking differently: Take 1 mg by mouth 5 (five) times daily. Sometimes takes four a day , just depends if she needs it or not) 30 tablet 0  . aspirin 81 MG tablet Take 81 mg by mouth daily.    . B Complex-C (SUPER B COMPLEX PO) Take 1 tablet by mouth daily.    [name] BIOTIN PO Take 10,000 mcg by mouth daily.    . Cholecalciferol (D3-1000 PO) Take 1 tablet by mouth daily.    [name] CINNAMON PO Take 1,000 mg by mouth daily.    . cyclobenzaprine (FLEXERIL) 10 MG tablet Take 10 mg by mouth at bedtime.    . hydrocortisone (ANUSOL-HC) 2.5 % rectal cream Place 1 application rectally 2 (two) times daily. (Patient taking differently: Place 1 application rectally 2 (two) times daily as needed. ) 30 g 1  . lithium carbonate (LITHOBID) 300 MG CR tablet Take 1 tablet by mouth daily as needed.     [name]  metFORMIN (GLUCOPHAGE) 1000 MG tablet Take 1 tablet (1,000 mg total) by mouth 2 (two) times daily with a meal. 180 tablet 3  . methylphenidate (RITALIN) 20 MG tablet Take 20 mg by mouth 3 (three) times daily with meals.     . multivitamin-lutein (OCUVITE-LUTEIN) CAPS capsule Take 1 capsule by mouth daily.    . naproxen sodium (ALEVE) 220 MG tablet Take 220 mg by mouth 2 (two) times daily as needed. 1 tablet 3 times a day and 2 at bedtime     . omeprazole (PRILOSEC) 20 MG capsule TAKE (1) CAPSULE BY MOUTH TWICE DAILY. 60 capsule 5  . prazosin (MINIPRESS) 1 MG capsule Take 1 mg by mouth at bedtime.    . rosuvastatin (CRESTOR) 20 MG tablet TAKE (1) TABLET BY MOUTH ONCE DAILY. 90 tablet 3  . venlafaxine XR (EFFEXOR-XR) 150 MG 24 hr capsule Take 150 mg by mouth daily with breakfast.    . vitamin E 400 UNIT  capsule Take 400 Units by mouth daily.     No current facility-administered medications for this visit.     Allergies as of 11/25/2017 - Review Complete 11/25/2017  Allergen Reaction Noted  . Codeine    . Dilaudid [hydromorphone hcl] Itching 05/23/2013  . Morphine and related Itching 03/02/2013  . Penicillins Rash   . Sulfonamide derivatives Rash     Past Medical History:  Diagnosis Date  . Allergic rhinitis   . Allergy   . Anemia    leukocytosis/thrombocytosis--Dr. Tressie Stalker  . Anxiety   . Arthritis   . Asthma   . 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 Closed head injury 2005  . COPD (chronic obstructive pulmonary disease) ([place])   . Depression    ? Bipolar Disorder--Dr, Jimmye Norman  . Emphysema of lung ([place])   . GERD (gastroesophageal reflux disease)    Barrett's esophagus  . Headache    migraines  . Hx of migraines   . Hypertension   . Low back pain   . Macular degeneration, bilateral    Dry-Dr. Iona Hansen  . Neuromuscular disorder ([place])   . NIDDM (non-insulin dependent diabetes mellitus)   . OSA (obstructive sleep apnea)   . RLS (restless legs syndrome)   . Seizure disorder ([place])    ? absence  . Seizures ([place])   . Sleep apnea   . Status post right hip replacement   . Status post rotator cuff surgery     Past Surgical History:  Procedure Laterality Date  . ABDOMINAL HYSTERECTOMY     heavy bleeding  . APPENDECTOMY    . BIOPSY  12/27/2014   Procedure: Esophageal BIOPSY;  Surgeon: Daneil Dolin, MD;  Location: [facility];  Service: Endoscopy;;  . COLONOSCOPY WITH ESOPHAGOGASTRODUODENOSCOPY (EGD)  12/08   TCS/EGD: + Barrett's,+ gastritis, friable anal canal, hyperplastic polyp  . COLONOSCOPY WITH PROPOFOL N/A 12/27/2014   RMR: Internal hemorrhoids likely source of hematochezia. Redundant colon.   . ESOPHAGOGASTRODUODENOSCOPY  11/16/2002   Dr.Rourk- salmon-colored epithelium in the upper esophageal sphincter previous bx= ectopic gastric mucosa, salmon colored epithelium  at the distal esophagus proviously bx= barretts esophagus o/w the esophageal mucosa appeared normal. patulous esophagogastric junction, small hiatal hernia, gastric mucosa o/w appeared normal. normal D1 and D2  . ESOPHAGOGASTRODUODENOSCOPY (EGD) WITH PROPOFOL N/A 12/27/2014   RMR: Abnoraml distal esophagus consistiant with prior diagnosis fo Barretts esophagus- Status post biopsy. Hiatal hernia. Antral erosions status post gastric biopsy,  . FRACTURE SURGERY Left 03/07/2013   knee  .  fx left knee Left 2014  . ORIF TIBIA PLATEAU Left 03/07/2013   Procedure: OPEN REDUCTION INTERNAL FIXATION (ORIF) TIBIAL PLATEAU;  Surgeon: Renette Butters, MD;  Location: [facility];  Service: Orthopedics;  Laterality: Left;  . REVISION TOTAL HIP ARTHROPLASTY     Right  . ROTATOR CUFF REPAIR     Bilaterally  . SHOULDER INJECTION Left 03/07/2013   Procedure: SHOULDER INJECTION ; Depomedrol + 0.25% Marcaine mixture 4:1;  Surgeon: Renette Butters, MD;  Location: [facility];  Service: Orthopedics;  Laterality: Left;  . TONSILLECTOMY    . TOTAL HIP ARTHROPLASTY Left 06/01/2016   Procedure: LEFT TOTAL HIP ARTHROPLASTY ANTERIOR APPROACH;  Surgeon: Gaynelle Arabian, MD;  Location: [facility];  Service: Orthopedics;  Laterality: Left;  [name] VESICOVAGINAL FISTULA CLOSURE W/ TAH      Family History  Problem Relation Age of Onset  . Heart disease Father 61  . Atrial fibrillation Mother   . Cancer Mother        carcinoid  . Diabetes Maternal Grandfather   . Cancer Maternal Grandfather   . Heart disease Maternal Grandmother   . Thyroid disease Son     Social History   Socioeconomic History  . Marital status: Divorced    Spouse name: Not on file  . Number of children: 3  . Years of education: 91  . Highest education level: Not on file  Occupational History  . Occupation: on disability    Employer: UNEMPLOYED    Comment: due to head injury, 2005  Social Needs  . Financial resource strain: Not on file  . Food insecurity:     Worry: Not on file    Inability: Not on file  . Transportation needs:    Medical: Not on file    Non-medical: Not on file  Tobacco Use  . Smoking status: Current Every Day Smoker    Packs/day: 0.50    Years: 40.00    Pack years: 20.00    Types: Cigarettes    Start date: 03/03/1971  . Smokeless tobacco: Never Used  Substance and Sexual Activity  . Alcohol use: No  . Drug use: No  . Sexual activity: Yes    Birth control/protection: Surgical  Lifestyle  . Physical activity:    Days per week: Not on file    Minutes per session: Not on file  . Stress: Not on file  Relationships  . Social connections:    Talks on phone: Not on file    Gets together: Not on file    Attends religious service: Not on file    Active member of club or organization: Not on file    Attends meetings of clubs or organizations: Not on file    Relationship status: Not on file  . Intimate partner violence:    Fear of current or ex partner: Not on file    Emotionally abused: Not on file    Physically abused: Not on file    Forced sexual activity: Not on file  Other Topics Concern  . Not on file  Social History Narrative   Lives alone   Two big dogs   Enjoys yard work, home improvement      ROS:  General: Negative for anorexia, weight loss, fever, chills, fatigue, weakness. Eyes: Negative for vision changes.  ENT: Negative for hoarseness, difficulty swallowing , nasal congestion. CV: Negative for chest pain, angina, palpitations, dyspnea on exertion, peripheral edema.  Respiratory: Negative for dyspnea at rest, dyspnea on exertion, cough,  sputum, wheezing.  GI: See history of present illness. GU:  Negative for dysuria, hematuria, urinary incontinence, urinary frequency, nocturnal urination.  MS: Negative for joint pain, low back pain.  Derm: Negative for rash or itching.  Neuro: Negative for weakness, abnormal sensation, seizure, frequent headaches, memory loss, confusion.  Psych: Negative for  anxiety, depression, suicidal ideation, hallucinations.  Endo: Negative for unusual weight change.  Heme: Negative for bruising or bleeding. Allergy: Negative for rash or hives.    Physical Examination:  BP (!) 145/86   Pulse 82   Temp (!) 97.1 F (36.2 C) (Oral)   Ht 5\' 5"  (1.651 m)   Wt 190 lb 12.8 oz (86.5 kg)   BMI 31.75 kg/m    General: Well-nourished, well-developed in no acute distress.  Head: Normocephalic, atraumatic.   Eyes: Conjunctiva pink, no icterus. Mouth: Oropharyngeal mucosa moist and pink , no lesions erythema or exudate. Neck: Supple without thyromegaly, masses, or lymphadenopathy.  Lungs: Clear to auscultation bilaterally.  Heart: Regular rate and rhythm, no murmurs rubs or gallops.  Abdomen: Bowel sounds are normal, protruberant without obvious mass. No obvious ascites. nontender, no hepatosplenomegaly or masses, no abdominal bruits or    hernia , no rebound or guarding.   Rectal: not performed Extremities: No lower extremity edema. No clubbing or deformities. Nails are visibly dirty Neuro: Alert and oriented x 4 , grossly normal neurologically.  Skin: Warm and dry, no rash or jaundice.   Psych: Alert and cooperative, normal mood and affect.   Imaging Studies: No results found.

## 2017-11-26 LAB — COMPREHENSIVE METABOLIC PANEL
AG RATIO: 1.8 (calc) (ref 1.0–2.5)
ALBUMIN MSPROF: 4.2 g/dL (ref 3.6–5.1)
ALKALINE PHOSPHATASE (APISO): 58 U/L (ref 33–130)
ALT: 14 U/L (ref 6–29)
AST: 14 U/L (ref 10–35)
BILIRUBIN TOTAL: 0.3 mg/dL (ref 0.2–1.2)
BUN: 22 mg/dL (ref 7–25)
CALCIUM: 8.6 mg/dL (ref 8.6–10.4)
CO2: 27 mmol/L (ref 20–32)
Chloride: 102 mmol/L (ref 98–110)
Creat: 0.78 mg/dL (ref 0.50–0.99)
GLOBULIN: 2.4 g/dL (ref 1.9–3.7)
Glucose, Bld: 134 mg/dL (ref 65–139)
POTASSIUM: 4.3 mmol/L (ref 3.5–5.3)
SODIUM: 139 mmol/L (ref 135–146)
TOTAL PROTEIN: 6.6 g/dL (ref 6.1–8.1)

## 2017-11-26 LAB — TSH: TSH: 1.42 m[IU]/L (ref 0.40–4.50)

## 2017-11-26 LAB — CBC WITH DIFFERENTIAL/PLATELET
Basophils Absolute: 98 cells/uL (ref 0–200)
Basophils Relative: 1 %
EOS PCT: 2.8 %
Eosinophils Absolute: 274 cells/uL (ref 15–500)
HEMATOCRIT: 41.1 % (ref 35.0–45.0)
Hemoglobin: 14.2 g/dL (ref 11.7–15.5)
Lymphs Abs: 3508 cells/uL (ref 850–3900)
MCH: 30.5 pg (ref 27.0–33.0)
MCHC: 34.5 g/dL (ref 32.0–36.0)
MCV: 88.4 fL (ref 80.0–100.0)
MONOS PCT: 7.6 %
MPV: 10.4 fL (ref 7.5–12.5)
NEUTROS PCT: 52.8 %
Neutro Abs: 5174 cells/uL (ref 1500–7800)
PLATELETS: 258 10*3/uL (ref 140–400)
RBC: 4.65 10*6/uL (ref 3.80–5.10)
RDW: 12.9 % (ref 11.0–15.0)
TOTAL LYMPHOCYTE: 35.8 %
WBC mixed population: 745 cells/uL (ref 200–950)
WBC: 9.8 10*3/uL (ref 3.8–10.8)

## 2017-11-26 LAB — HEMOGLOBIN A1C
HEMOGLOBIN A1C: 9.8 %{Hb} — AB (ref <5.7)
Mean Plasma Glucose: 235 (calc)
eAG (mmol/L): 13 (calc)

## 2017-11-26 LAB — LIPASE: LIPASE: 39 U/L (ref 7–60)

## 2017-11-26 NOTE — Assessment & Plan Note (Addendum)
Abdominal distention without pain, intermittent vomiting and change in bowel habits with episodes of urgency and fecal incontinence.  She may end up with colonoscopy at time of her upper endoscopy but initially would like to evaluate via CT.  I doubt she has ascites.  Symptoms may be functional versus ileus, less likely partial obstruction.  Less likely malignancy. Update labs.  Further recommendations to follow.

## 2017-11-26 NOTE — Assessment & Plan Note (Signed)
Change in bowel habits.  She has had daily soft stools, associated with incontinence at times.  Passes stool when she urinates.  Episodes of diarrhea once per week described as pretty large volume.  Colonoscopy 3 years ago unremarkable.  Await pending labs, CT.  May be due to medication, increase in metformin?? Cannot exclude the need for updated colonoscopy to rule out microscopic colitis although I feel like this is less likely.

## 2017-11-26 NOTE — Assessment & Plan Note (Signed)
Patient needs to establish care with PCP.  She is having trouble finding someone to take new patients with Medicare.  She continues to look.  We will update A1c.  She has been given her prescriptions from her psychiatrist.

## 2017-11-26 NOTE — Assessment & Plan Note (Signed)
Due for surveillance.  We will make arrangements once initial work-up on labs and CT complete.  She has intermittent nausea and vomiting along with abdominal distention, differential includes delayed gastric emptying, refractory reflux, ileus or partial obstruction less likely.  Her weight is stable.  Further recommendations to follow pending studies.

## 2017-11-29 NOTE — Progress Notes (Signed)
No pcp

## 2017-12-14 ENCOUNTER — Ambulatory Visit (HOSPITAL_COMMUNITY)
Admission: RE | Admit: 2017-12-14 | Discharge: 2017-12-14 | Disposition: A | Discharge status: Home / Self Care | Payer: Medicare Other | Source: Ambulatory Visit | Attending: Gastroenterology | Admitting: Gastroenterology

## 2017-12-14 DIAGNOSIS — R112 Nausea with vomiting, unspecified: Secondary | ICD-10-CM | POA: Insufficient documentation

## 2017-12-14 DIAGNOSIS — R197 Diarrhea, unspecified: Secondary | ICD-10-CM | POA: Diagnosis not present

## 2017-12-14 DIAGNOSIS — R140 Abdominal distension (gaseous): Secondary | ICD-10-CM | POA: Insufficient documentation

## 2017-12-14 MED ORDER — IOPAMIDOL (ISOVUE-300) INJECTION 61%
100.0000 mL | Freq: Once | INTRAVENOUS | Status: AC | PRN
  Administered 2017-12-14: 100 mL via INTRAVENOUS

## 2017-12-16 ENCOUNTER — Encounter: Payer: Self-pay | Admitting: Internal Medicine

## 2017-12-28 ENCOUNTER — Other Ambulatory Visit: Payer: Self-pay

## 2017-12-28 ENCOUNTER — Encounter

## 2017-12-28 ENCOUNTER — Telehealth: Payer: Self-pay | Admitting: Internal Medicine

## 2017-12-28 ENCOUNTER — Ambulatory Visit: Payer: Medicare Other | Admitting: Gastroenterology

## 2017-12-28 DIAGNOSIS — K2270 Barrett's esophagus without dysplasia: Secondary | ICD-10-CM

## 2017-12-28 NOTE — Telephone Encounter (Signed)
Pt called office, EGD w/Propofol w/RMR scheduled for 01/20/18 at 1:30pm. Orders entered. Will mail instructions after pre-op appt is scheduled.

## 2017-12-28 NOTE — Telephone Encounter (Signed)
[phone] PATIENT CALLED HERSELF ASKING ABOUT RESULTS

## 2017-12-28 NOTE — Telephone Encounter (Signed)
Tried to call pt to inform of pre-op appt 01/14/18 at 2:15pm, no answer, LMOVM. Appt letter mailed with procedure instructions.

## 2017-12-28 NOTE — Telephone Encounter (Signed)
Pt was given lab and CT results. Letter was mailed to pt due to falliure to return calls. Pt was given [place] contact info to establish care with a PCP per LSL.  LSL-  Pts diarrhea has stopped since she was seen last.  [facility]- She needs EGD with propofol for Barrett's surveillance with RMR.she has sleep apnea per LSL. You can refer to message under CT results.

## 2017-12-28 NOTE — Telephone Encounter (Signed)
noted 

## 2017-12-28 NOTE — Telephone Encounter (Signed)
PATIENT MOTHER CALLED BECAUSE DAUGHTER HAS NOT HEARD ANYTHING ABOUT HER PROCEDURES   STATED TO CALL HER BECAUSE IF THE PATIENT SEES OUT NUMBER SHE WILL NOT ANSWER OUT OF FEAR

## 2017-12-28 NOTE — Telephone Encounter (Signed)
Tried to call pt, no answer, LMOVM. 

## 2018-01-04 ENCOUNTER — Telehealth: Payer: Self-pay | Admitting: Internal Medicine

## 2018-01-04 NOTE — Telephone Encounter (Signed)
WILL YOU PLEASE LOOK AND SEE WHEN THIS PATIENT IS TO HAVE A REPEAT TCS.  HAD ONE IN 2016 BUT THERE IS NO RESULTS LETTER SCANNED IN

## 2018-01-04 NOTE — Telephone Encounter (Signed)
Adrienne [name], does she need a tcs now? Per your ov note, she may need early tcs but it was not mentioned in her lab or ct results. She is scheduled for EGD 01/19/18.

## 2018-01-05 NOTE — Telephone Encounter (Signed)
She came up on recall but currently not due for TCS.   We discussed possibly TCS at last ov due to diarrhea but she reported symptoms resolved per telephone note on 12/28/17.   Plan for EGD only right now.

## 2018-01-07 NOTE — Telephone Encounter (Signed)
Tried to call pt- NA- LMOM with recommendations.  

## 2018-01-10 NOTE — Patient Instructions (Signed)
IVIS NICOLSON  01/10/2018     @PREFPERIOPPHARMACY @   Your procedure is scheduled on 01/20/2018 .  Report to [facility] at  1200  P.M.  Call this number if you have problems the morning of surgery:  [phone]   Remember:  Follow the diet and prep instructions given to you by Dr Gala Romney' s office.                       Take these medicines the morning of surgery with A SIP OF WATER  Xanax ( if needed), flexaril, ritalin, prilosec, effexor.    Do not wear jewelry, make-up or nail polish.  Do not wear lotions, powders, or perfumes, or deodorant.  Do not shave 48 hours prior to surgery.  Men may shave face and neck.  Do not bring valuables to the hospital.  [facility] is not responsible for any belongings or valuables.  Contacts, dentures or bridgework may not be worn into surgery.  Leave your suitcase in the car.  After surgery it may be brought to your room.  For patients admitted to the hospital, discharge time will be determined by your treatment team.  Patients discharged the day of surgery will not be allowed to drive home.   Name and phone number of your driver:   family Special instructions:  DO NOT take any medications foe diabetes the morning of your procedure.  Please read over the following fact sheets that you were given. Anesthesia Post-op Instructions and Care and Recovery After Surgery       Esophagogastroduodenoscopy Esophagogastroduodenoscopy (EGD) is a procedure to examine the lining of the esophagus, stomach, and first part of the small intestine (duodenum). This procedure is done to check for problems such as inflammation, bleeding, ulcers, or growths. During this procedure, a long, flexible, lighted tube with a camera attached (endoscope) is inserted down the throat. Tell a health care provider about:  Any allergies you have.  All medicines you are taking, including vitamins, herbs, eye drops, creams, and over-the-counter  medicines.  Any problems you or family members have had with anesthetic medicines.  Any blood disorders you have.  Any surgeries you have had.  Any medical conditions you have.  Whether you are pregnant or may be pregnant. What are the risks? Generally, this is a safe procedure. However, problems may occur, including:  Infection.  Bleeding.  A tear (perforation) in the esophagus, stomach, or duodenum.  Trouble breathing.  Excessive sweating.  Spasms of the larynx.  A slowed heartbeat.  Low blood pressure.  What happens before the procedure?  Follow instructions from your health care provider about eating or drinking restrictions.  Ask your health care provider about: ? Changing or stopping your regular medicines. This is especially important if you are taking diabetes medicines or blood thinners. ? Taking medicines such as aspirin and ibuprofen. These medicines can thin your blood. Do not take these medicines before your procedure if your health care provider instructs you not to.  Plan to have someone take you home after the procedure.  If you wear dentures, be ready to remove them before the procedure. What happens during the procedure?  To reduce your risk of infection, your health care team will wash or sanitize their hands.  An IV tube will be put in a vein in your hand or arm. You will get medicines and fluids through this tube.  You  will be given one or more of the following: ? A medicine to help you relax (sedative). ? A medicine to numb the area (local anesthetic). This medicine may be sprayed into your throat. It will make you feel more comfortable and keep you from gagging or coughing during the procedure. ? A medicine for pain.  A mouth guard may be placed in your mouth to protect your teeth and to keep you from biting on the endoscope.  You will be asked to lie on your left side.  The endoscope will be lowered down your throat into your esophagus,  stomach, and duodenum.  Air will be put into the endoscope. This will help your health care provider see better.  The lining of your esophagus, stomach, and duodenum will be examined.  Your health care provider may: ? Take a tissue sample so it can be looked at in a lab (biopsy). ? Remove growths. ? Remove objects (foreign bodies) that are stuck. ? Treat any bleeding with medicines or other devices that stop tissue from bleeding. ? Widen (dilate) or stretch narrowed areas of your esophagus and stomach.  The endoscope will be taken out. The procedure may vary among health care providers and hospitals. What happens after the procedure?  Your blood pressure, heart rate, breathing rate, and blood oxygen level will be monitored often until the medicines you were given have worn off.  Do not eat or drink anything until the numbing medicine has worn off and your gag reflex has returned. This information is not intended to replace advice given to you by your health care provider. Make sure you discuss any questions you have with your health care provider. Document Released: 06/26/2004 Document Revised: 08/01/2015 Document Reviewed: 01/17/2015 Elsevier Interactive Patient Education  2018 [employer]. Esophagogastroduodenoscopy, Care After Refer to this sheet in the next few weeks. These instructions provide you with information about caring for yourself after your procedure. Your health care provider may also give you more specific instructions. Your treatment has been planned according to current medical practices, but problems sometimes occur. Call your health care provider if you have any problems or questions after your procedure. What can I expect after the procedure? After the procedure, it is common to have:  A sore throat.  Nausea.  Bloating.  Dizziness.  Fatigue.  Follow these instructions at home:  Do not eat or drink anything until the numbing medicine (local anesthetic)  has worn off and your gag reflex has returned. You will know that the local anesthetic has worn off when you can swallow comfortably.  Do not drive for 24 hours if you received a medicine to help you relax (sedative).  If your health care provider took a tissue sample for testing during the procedure, make sure to get your test results. This is your responsibility. Ask your health care provider or the department performing the test when your results will be ready.  Keep all follow-up visits as told by your health care provider. This is important. Contact a health care provider if:  You cannot stop coughing.  You are not urinating.  You are urinating less than usual. Get help right away if:  You have trouble swallowing.  You cannot eat or drink.  You have throat or chest pain that gets worse.  You are dizzy or light-headed.  You faint.  You have nausea or vomiting.  You have chills.  You have a fever.  You have severe abdominal pain.  You have black,  tarry, or bloody stools. This information is not intended to replace advice given to you by your health care provider. Make sure you discuss any questions you have with your health care provider. Document Released: 02/10/2012 Document Revised: 08/01/2015 Document Reviewed: 01/17/2015 Elsevier Interactive Patient Education  2018 [place] Anesthesia is a term that refers to techniques, procedures, and medicines that help a person stay safe and comfortable during a medical procedure. Monitored anesthesia care, or sedation, is one type of anesthesia. Your anesthesia specialist may recommend sedation if you will be having a procedure that does not require you to be unconscious, such as:  Cataract surgery.  A dental procedure.  A biopsy.  A colonoscopy.  During the procedure, you may receive a medicine to help you relax (sedative). There are three levels of sedation:  Mild sedation. At this  level, you may feel awake and relaxed. You will be able to follow directions.  Moderate sedation. At this level, you will be sleepy. You may not remember the procedure.  Deep sedation. At this level, you will be asleep. You will not remember the procedure.  The more medicine you are given, the deeper your level of sedation will be. Depending on how you respond to the procedure, the anesthesia specialist may change your level of sedation or the type of anesthesia to fit your needs. An anesthesia specialist will monitor you closely during the procedure. Let your health care provider know about:  Any allergies you have.  All medicines you are taking, including vitamins, herbs, eye drops, creams, and over-the-counter medicines.  Any use of steroids (by mouth or as a cream).  Any problems you or family members have had with sedatives and anesthetic medicines.  Any blood disorders you have.  Any surgeries you have had.  Any medical conditions you have, such as sleep apnea.  Whether you are pregnant or may be pregnant.  Any use of cigarettes, alcohol, or street drugs. What are the risks? Generally, this is a safe procedure. However, problems may occur, including:  Getting too much medicine (oversedation).  Nausea.  Allergic reaction to medicines.  Trouble breathing. If this happens, a breathing tube may be used to help with breathing. It will be removed when you are awake and breathing on your own.  Heart trouble.  Lung trouble.  Before the procedure Staying hydrated Follow instructions from your health care provider about hydration, which may include:  Up to 2 hours before the procedure - you may continue to drink clear liquids, such as water, clear fruit juice, black coffee, and plain tea.  Eating and drinking restrictions Follow instructions from your health care provider about eating and drinking, which may include:  8 hours before the procedure - stop eating heavy  meals or foods such as meat, fried foods, or fatty foods.  6 hours before the procedure - stop eating light meals or foods, such as toast or cereal.  6 hours before the procedure - stop drinking milk or drinks that contain milk.  2 hours before the procedure - stop drinking clear liquids.  Medicines Ask your health care provider about:  Changing or stopping your regular medicines. This is especially important if you are taking diabetes medicines or blood thinners.  Taking medicines such as aspirin and ibuprofen. These medicines can thin your blood. Do not take these medicines before your procedure if your health care provider instructs you not to.  Tests and exams  You will have a physical  exam.  You may have blood tests done to show: ? How well your kidneys and liver are working. ? How well your blood can clot.  General instructions  Plan to have someone take you home from the hospital or clinic.  If you will be going home right after the procedure, plan to have someone with you for 24 hours.  What happens during the procedure?  Your blood pressure, heart rate, breathing, level of pain and overall condition will be monitored.  An IV tube will be inserted into one of your veins.  Your anesthesia specialist will give you medicines as needed to keep you comfortable during the procedure. This may mean changing the level of sedation.  The procedure will be performed. After the procedure  Your blood pressure, heart rate, breathing rate, and blood oxygen level will be monitored until the medicines you were given have worn off.  Do not drive for 24 hours if you received a sedative.  You may: ? Feel sleepy, clumsy, or nauseous. ? Feel forgetful about what happened after the procedure. ? Have a sore throat if you had a breathing tube during the procedure. ? Vomit. This information is not intended to replace advice given to you by your health care provider. Make sure you  discuss any questions you have with your health care provider. Document Released: 11/19/2004 Document Revised: 08/02/2015 Document Reviewed: 06/16/2015 Elsevier Interactive Patient Education  2018 [place], Care After These instructions provide you with information about caring for yourself after your procedure. Your health care provider may also give you more specific instructions. Your treatment has been planned according to current medical practices, but problems sometimes occur. Call your health care provider if you have any problems or questions after your procedure. What can I expect after the procedure? After your procedure, it is common to:  Feel sleepy for several hours.  Feel clumsy and have poor balance for several hours.  Feel forgetful about what happened after the procedure.  Have poor judgment for several hours.  Feel nauseous or vomit.  Have a sore throat if you had a breathing tube during the procedure.  Follow these instructions at home: For at least 24 hours after the procedure:   Do not: ? Participate in activities in which you could fall or become injured. ? Drive. ? Use heavy machinery. ? Drink alcohol. ? Take sleeping pills or medicines that cause drowsiness. ? Make important decisions or sign legal documents. ? Take care of children on your own.  Rest. Eating and drinking  Follow the diet that is recommended by your health care provider.  If you vomit, drink water, juice, or soup when you can drink without vomiting.  Make sure you have little or no nausea before eating solid foods. General instructions  Have a responsible adult stay with you until you are awake and alert.  Take over-the-counter and prescription medicines only as told by your health care provider.  If you smoke, do not smoke without supervision.  Keep all follow-up visits as told by your health care provider. This is important. Contact a health  care provider if:  You keep feeling nauseous or you keep vomiting.  You feel light-headed.  You develop a rash.  You have a fever. Get help right away if:  You have trouble breathing. This information is not intended to replace advice given to you by your health care provider. Make sure you discuss any questions you have with your health  care provider. Document Released: 06/16/2015 Document Revised: 10/16/2015 Document Reviewed: 06/16/2015 Elsevier Interactive Patient Education  [employer].

## 2018-01-14 ENCOUNTER — Other Ambulatory Visit: Payer: Self-pay

## 2018-01-14 ENCOUNTER — Encounter (HOSPITAL_COMMUNITY): Payer: Self-pay

## 2018-01-14 ENCOUNTER — Encounter (HOSPITAL_COMMUNITY)
Admission: RE | Admit: 2018-01-14 | Discharge: 2018-01-14 | Disposition: A | Discharge status: Home / Self Care | Payer: Medicare Other | Source: Ambulatory Visit | Attending: Internal Medicine | Admitting: Internal Medicine

## 2018-01-14 DIAGNOSIS — Z01810 Encounter for preprocedural cardiovascular examination: Secondary | ICD-10-CM | POA: Insufficient documentation

## 2018-01-14 HISTORY — DX: Adverse effect of unspecified anesthetic, initial encounter: T41.45XA

## 2018-01-14 HISTORY — DX: Other complications of anesthesia, initial encounter: T88.59XA

## 2018-01-17 ENCOUNTER — Telehealth: Payer: Self-pay

## 2018-01-17 NOTE — Telephone Encounter (Signed)
-----   Message from Daneil Dolin, MD sent at 01/17/2018 12:56 PM EST ----- 2026 ----- Message ----- From: Claudina Lick, LPN Sent: 23/0/1720  10:56 AM EST To: Daneil Dolin, MD  Dr.Rourk, this pts last tcs was in 2016- no polyps, no hx of polyps, I dont see any family hx of CRC. When does she need to repeat her tcs?

## 2018-01-17 NOTE — Telephone Encounter (Signed)
Reminder in epic °

## 2018-01-17 NOTE — Telephone Encounter (Signed)
Please nic tcs 2026

## 2018-01-20 ENCOUNTER — Telehealth: Payer: Self-pay | Admitting: Internal Medicine

## 2018-01-20 NOTE — Telephone Encounter (Signed)
Instructions mailed to pt. Called and informed her she will receive pre-op phone call 02/14/18.

## 2018-01-20 NOTE — Telephone Encounter (Signed)
Called pt, EGD w/Propofol w/RMR rescheduled to 02/17/18 at 12:45pm. LMOVM for endo scheduler.

## 2018-01-20 NOTE — Telephone Encounter (Signed)
Pt called to say she woke up sick and can't make her procedure today with RMR. She said she has already called the hospital. Can she be rescheduled or does she need OV first? [phone]

## 2018-02-02 ENCOUNTER — Telehealth: Payer: Self-pay | Admitting: Internal Medicine

## 2018-02-02 NOTE — Telephone Encounter (Signed)
[phone]  Please call patient, she wants something called in for nausea and vomiting.  Mother called and  Relayed this message

## 2018-02-02 NOTE — Telephone Encounter (Signed)
Agree with advise provided. Await return call from patient.

## 2018-02-02 NOTE — Telephone Encounter (Signed)
Spoke with pts mother. Pt has been been vomiting and had diarrhea x 1 week. Pt isn't able to keep water down. They are asking for some nausea medication. Pts mother was advised to have pt go to the ED if she can't hold anything down or symptoms worsen. Tried calling pt for more info, no answer, left a message for pt.

## 2018-02-04 ENCOUNTER — Other Ambulatory Visit: Payer: Self-pay

## 2018-02-04 ENCOUNTER — Encounter (HOSPITAL_COMMUNITY): Payer: Self-pay | Admitting: Emergency Medicine

## 2018-02-04 ENCOUNTER — Emergency Department (HOSPITAL_COMMUNITY)
Admission: EM | Admit: 2018-02-04 | Discharge: 2018-02-04 | Disposition: A | Discharge status: Home / Self Care | Payer: Medicare Other | Attending: Emergency Medicine | Admitting: Emergency Medicine

## 2018-02-04 DIAGNOSIS — J45909 Unspecified asthma, uncomplicated: Secondary | ICD-10-CM | POA: Insufficient documentation

## 2018-02-04 DIAGNOSIS — F17210 Nicotine dependence, cigarettes, uncomplicated: Secondary | ICD-10-CM | POA: Insufficient documentation

## 2018-02-04 DIAGNOSIS — Z7984 Long term (current) use of oral hypoglycemic drugs: Secondary | ICD-10-CM | POA: Insufficient documentation

## 2018-02-04 DIAGNOSIS — Z79899 Other long term (current) drug therapy: Secondary | ICD-10-CM | POA: Insufficient documentation

## 2018-02-04 DIAGNOSIS — I10 Essential (primary) hypertension: Secondary | ICD-10-CM | POA: Diagnosis not present

## 2018-02-04 DIAGNOSIS — Z7982 Long term (current) use of aspirin: Secondary | ICD-10-CM | POA: Diagnosis not present

## 2018-02-04 DIAGNOSIS — K047 Periapical abscess without sinus: Secondary | ICD-10-CM

## 2018-02-04 DIAGNOSIS — R739 Hyperglycemia, unspecified: Secondary | ICD-10-CM

## 2018-02-04 DIAGNOSIS — R6884 Jaw pain: Secondary | ICD-10-CM | POA: Diagnosis present

## 2018-02-04 DIAGNOSIS — E1165 Type 2 diabetes mellitus with hyperglycemia: Secondary | ICD-10-CM | POA: Diagnosis not present

## 2018-02-04 LAB — COMPREHENSIVE METABOLIC PANEL
ALBUMIN: 4.4 g/dL (ref 3.5–5.0)
ALT: 18 U/L (ref 0–44)
ANION GAP: 11 (ref 5–15)
AST: 22 U/L (ref 15–41)
Alkaline Phosphatase: 48 U/L (ref 38–126)
BUN: 11 mg/dL (ref 8–23)
CO2: 24 mmol/L (ref 22–32)
Calcium: 9 mg/dL (ref 8.9–10.3)
Chloride: 100 mmol/L (ref 98–111)
Creatinine, Ser: 0.9 mg/dL (ref 0.44–1.00)
GFR calc non Af Amer: >60 mL/min (ref >60)
Glucose, Bld: 354 mg/dL — ABNORMAL HIGH (ref 70–99)
POTASSIUM: 3.6 mmol/L (ref 3.5–5.1)
SODIUM: 135 mmol/L (ref 135–145)
TOTAL PROTEIN: 8 g/dL (ref 6.5–8.1)
Total Bilirubin: 0.7 mg/dL (ref 0.3–1.2)

## 2018-02-04 LAB — URINALYSIS, ROUTINE W REFLEX MICROSCOPIC
BILIRUBIN URINE: NEGATIVE
Bacteria, UA: NONE SEEN
Glucose, UA: >=500 mg/dL — AB
Hgb urine dipstick: NEGATIVE
KETONES UR: NEGATIVE mg/dL
LEUKOCYTES UA: NEGATIVE
Nitrite: NEGATIVE
PROTEIN: NEGATIVE mg/dL
Specific Gravity, Urine: 1.032 — ABNORMAL HIGH (ref 1.005–1.030)
pH: 5 (ref 5.0–8.0)

## 2018-02-04 LAB — CBC
HCT: 50.5 % — ABNORMAL HIGH (ref 36.0–46.0)
HEMOGLOBIN: 15.9 g/dL — AB (ref 12.0–15.0)
MCH: 30.3 pg (ref 26.0–34.0)
MCHC: 31.5 g/dL (ref 30.0–36.0)
MCV: 96.4 fL (ref 80.0–100.0)
NRBC: 0 % (ref 0.0–0.2)
Platelets: 320 10*3/uL (ref 150–400)
RBC: 5.24 MIL/uL — AB (ref 3.87–5.11)
RDW: 14.2 % (ref 11.5–15.5)
WBC: 11.7 10*3/uL — ABNORMAL HIGH (ref 4.0–10.5)

## 2018-02-04 LAB — CBG MONITORING, ED: GLUCOSE-CAPILLARY: 452 mg/dL — AB (ref 70–99)

## 2018-02-04 MED ORDER — CLINDAMYCIN PHOSPHATE 600 MG/50ML IV SOLN
600.0000 mg | Freq: Once | INTRAVENOUS | Status: AC
  Administered 2018-02-04: 600 mg via INTRAVENOUS
  Filled 2018-02-04: qty 50

## 2018-02-04 MED ORDER — ONDANSETRON 4 MG PO TBDP: 4.0000 mg | ORAL_TABLET | Freq: Three times a day (TID) | ORAL | 0 refills | Status: DC | PRN

## 2018-02-04 MED ORDER — CLINDAMYCIN HCL 150 MG PO CAPS: 300.0000 mg | ORAL_CAPSULE | Freq: Three times a day (TID) | ORAL | 0 refills | Status: DC

## 2018-02-04 MED ORDER — SODIUM CHLORIDE 0.9 % IV BOLUS
1000.0000 mL | Freq: Once | INTRAVENOUS | Status: AC
  Administered 2018-02-04: 1000 mL via INTRAVENOUS

## 2018-02-04 MED ORDER — SODIUM CHLORIDE 0.9 % IV SOLN
INTRAVENOUS | Status: DC
  Administered 2018-02-04: 13:00:00 via INTRAVENOUS

## 2018-02-04 MED ORDER — ONDANSETRON HCL 4 MG/2ML IJ SOLN
4.0000 mg | Freq: Once | INTRAMUSCULAR | Status: AC
  Administered 2018-02-04: 4 mg via INTRAVENOUS

## 2018-02-04 NOTE — Discharge Instructions (Signed)
Please call [facility] at [phone]  There are multiple other dental offices as well.  He will need to be seen within the next 5 days for a recheck.  Please continue to take clindamycin, 300 mg by mouth 3 times a day for the next 10 days, ibuprofen or Tylenol for pain.  If you should develop severe or worsening pain, swelling, fever or vomiting you should return to the emergency department immediately.  You may also try Zofran for nausea every 6 hours as needed.  Drink plenty of fluids and make sure you are taking your diabetic medications as prescribed.  Eat a diabetic healthy diet low in sugar.

## 2018-02-04 NOTE — ED Provider Notes (Signed)
[facility] EMERGENCY DEPARTMENT Provider Note   CSN: [id number] Arrival date & time: 02/04/18  1157     History   Chief Complaint Chief Complaint  Patient presents with  . Emesis    HPI Adrienne [name] is a 62 y.o. female.  HPI  The patient is a 62 year old female, she is a known history of diabetes, she is currently taking medications including metformin as well as lithium, she has a history of recently having dental infection on the left side of her jaw, she was seen at the dentist and states that she was told that she needed to have a bridge on her lower teeth but was told that the dental abscess was not going to be treated.  She also commented on a cavity on the right side of her lower jaw.  Because this was not aggressively managed by the dentist the patient states that she decided to stop going to him.  She presents today with increasing pain and swelling of her right lower jaw.  She denies fevers or chills but has had nausea and vomiting which is been going on for a month, she is Artie seen the gastroenterologist and had a CT scan which was nondiagnostic and is scheduled for an endoscopy.  The patient denies being focally nauseated at this time, she denies swelling, denies urinary frequency dysuria diarrhea or rectal bleeding.  She has no abdominal pain chest pain coughing or shortness of breath.  The right side of her face has been progressively more swollen and tender and painful.  She has not had antibiotics for this.  It has become acutely worsened overnight.  Past Medical History:  Diagnosis Date  . Allergic rhinitis   . Allergy   . Anemia    leukocytosis/thrombocytosis--Dr. Tressie Stalker  . Anxiety   . Arthritis   . Asthma   . 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 Closed head injury 2005  . Complication of anesthesia    "my blood pressure dropped"  . COPD (chronic obstructive pulmonary disease) ([place])   . Depression    ? Bipolar Disorder--Dr, Jimmye Norman  . Emphysema of  lung ([place])   . GERD (gastroesophageal reflux disease)    Barrett's esophagus  . Headache    migraines  . Hx of migraines   . Hypertension   . Low back pain   . Macular degeneration, bilateral    Dry-Dr. Iona Hansen  . Neuromuscular disorder ([place])   . NIDDM (non-insulin dependent diabetes mellitus)   . OSA (obstructive sleep apnea)    cannot tolerate mask  . RLS (restless legs syndrome)   . Seizure disorder ([place])    ? absence; due to head truama  . Seizures ([place])   . Sleep apnea   . Status post right hip replacement   . Status post rotator cuff surgery     Patient Active Problem List   Diagnosis Date Noted  . Diarrhea 11/25/2017  . Nausea with vomiting 11/25/2017  . Abdominal distention 11/25/2017  . Fecal smearing 07/08/2017  . Status post bilateral total hip replacement 08/27/2016  . Emphysema of lung ([place]) 05/22/2016  . Atherosclerosis of aorta ([place]) 05/22/2016  . GAD (generalized anxiety disorder) 05/22/2016  . Mucosal abnormality of stomach   . Bipolar 1 disorder ([place]) 05/15/2014  . History of smoking 30 or more pack years 05/15/2014  . Barrett's esophagus 04/03/2010  . SHOULDER, ARTHRITIS, DEGEN./OSTEO 03/06/2009  . RUPTURE ROTATOR CUFF 03/06/2009  . Type 2 diabetes mellitus without  complications ([place]) 61/60/7371  . H N P-LUMBAR 06/29/2007  . RESTLESS LEG SYNDROME 05/18/2007  . SLEEP APNEA 05/18/2007  . Headache(784.0) 05/18/2007  . SCIATICA 03/31/2007  . DEPRESSION 03/28/2007  . MACULAR DEGENERATION 03/28/2007  . ALLERGIC RHINITIS 03/28/2007  . GERD 03/28/2007  . LOW BACK PAIN 03/28/2007  . SEIZURE DISORDER 03/28/2007  . HEAD TRAUMA, CLOSED 03/28/2007    Past Surgical History:  Procedure Laterality Date  . ABDOMINAL HYSTERECTOMY     heavy bleeding  . APPENDECTOMY    . BIOPSY  12/27/2014   Procedure: Esophageal BIOPSY;  Surgeon: Daneil Dolin, MD;  Location: [facility];  Service: Endoscopy;;  . COLONOSCOPY WITH ESOPHAGOGASTRODUODENOSCOPY (EGD)  12/08    TCS/EGD: + Barrett's,+ gastritis, friable anal canal, hyperplastic polyp  . COLONOSCOPY WITH PROPOFOL N/A 12/27/2014   RMR: Internal hemorrhoids likely source of hematochezia. Redundant colon.   . ESOPHAGOGASTRODUODENOSCOPY  11/16/2002   Dr.Rourk- salmon-colored epithelium in the upper esophageal sphincter previous bx= ectopic gastric mucosa, salmon colored epithelium at the distal esophagus proviously bx= barretts esophagus o/w the esophageal mucosa appeared normal. patulous esophagogastric junction, small hiatal hernia, gastric mucosa o/w appeared normal. normal D1 and D2  . ESOPHAGOGASTRODUODENOSCOPY (EGD) WITH PROPOFOL N/A 12/27/2014   RMR: Abnoraml distal esophagus consistiant with prior diagnosis fo Barretts esophagus- Status post biopsy. Hiatal hernia. Antral erosions status post gastric biopsy,  . FRACTURE SURGERY Left 03/07/2013   knee  . fx left knee Left 2014  . ORIF TIBIA PLATEAU Left 03/07/2013   Procedure: OPEN REDUCTION INTERNAL FIXATION (ORIF) TIBIAL PLATEAU;  Surgeon: Renette Butters, MD;  Location: [facility];  Service: Orthopedics;  Laterality: Left;  . REVISION TOTAL HIP ARTHROPLASTY     Right  . ROTATOR CUFF REPAIR     Bilaterally  . SHOULDER INJECTION Left 03/07/2013   Procedure: SHOULDER INJECTION ; Depomedrol + 0.25% Marcaine mixture 4:1;  Surgeon: Renette Butters, MD;  Location: [place] [facility];  Service: Orthopedics;  Laterality: Left;  . TONSILLECTOMY    . TOTAL HIP ARTHROPLASTY Left 06/01/2016   Procedure: LEFT TOTAL HIP ARTHROPLASTY ANTERIOR APPROACH;  Surgeon: Gaynelle Arabian, MD;  Location: [facility];  Service: Orthopedics;  Laterality: Left;  [name] VESICOVAGINAL FISTULA CLOSURE W/ TAH       OB History   None      Home Medications    Prior to Admission medications   Medication Sig Start Date End Date Taking? Authorizing Provider  ALPRAZolam Duanne Moron) 1 MG tablet Take 1 tablet (1 mg total) by mouth at bedtime as needed. Patient taking differently: Take 1-2 mg by mouth See admin  instructions. Take 1 mg by mouth in the morning, take 1 mg by mouth at 1200, take 1 mg by mouth at 1600 and take 2 mg by mouth at bedtime 03/13/13  Yes Reed, Tiffany L, DO  aspirin 81 MG tablet Take 81 mg by mouth daily.   Yes [provider]  B Complex-C (SUPER B COMPLEX PO) Take 1 tablet by mouth daily.   Yes [provider]  Biotin 10 MG CAPS Take 10 mg by mouth daily.    Yes [provider]  Cholecalciferol (D3-1000) 25 MCG (1000 UT) capsule Take 1,000 Units by mouth daily.    Yes [provider]  CINNAMON PO Take 1,000 mg by mouth 2 (two) times daily.    Yes [provider]  cyclobenzaprine (FLEXERIL) 10 MG tablet Take 30 mg by mouth at bedtime.    Yes [provider]  Flaxseed,  Linseed, (FLAX SEED OIL PO) Take 1,200 mg by mouth daily.   Yes [provider]  hydrocortisone (ANUSOL-HC) 2.5 % rectal cream Place 1 application rectally 2 (two) times daily. Patient taking differently: Place 1 application rectally 2 (two) times daily as needed for hemorrhoids or anal itching.  07/08/17  Yes Annitta Needs, NP  lithium carbonate (LITHOBID) 300 MG CR tablet Take 600 mg by mouth at bedtime.  07/05/17  Yes [provider]  metFORMIN (GLUCOPHAGE) 1000 MG tablet Take 1 tablet (1,000 mg total) by mouth 2 (two) times daily with a meal. 12/10/16  Yes Raylene Everts, MD  methylphenidate (RITALIN) 20 MG tablet Take 20 mg by mouth 3 (three) times daily with meals.    Yes [provider]  Multiple Vitamins-Minerals (OCUVITE ADULT 50+ PO) Take 1 capsule by mouth daily.   Yes [provider]  naproxen sodium (ALEVE) 220 MG tablet Take 440 mg by mouth 2 (two) times daily as needed (for pain or headache).    Yes [provider]  Omega-3 Fatty Acids (FISH OIL) 1000 MG CAPS Take 1,000 mg by mouth daily.   Yes [provider]  omeprazole (PRILOSEC) 20 MG capsule TAKE (1) CAPSULE BY MOUTH TWICE DAILY. Patient taking  differently: Take 20 mg by mouth 2 (two) times daily before a meal.  10/07/17  Yes Annitta Needs, NP  prazosin (MINIPRESS) 1 MG capsule Take 1 mg by mouth at bedtime.   Yes [provider]  rosuvastatin (CRESTOR) 20 MG tablet TAKE (1) TABLET BY MOUTH ONCE DAILY. Patient taking differently: Take 20 mg by mouth daily.  02/11/17  Yes Raylene Everts, MD  venlafaxine XR (EFFEXOR-XR) 150 MG 24 hr capsule Take 150 mg by mouth daily with breakfast.   Yes [provider]  vitamin E 400 UNIT capsule Take 400 Units by mouth daily.   Yes [provider]  clindamycin (CLEOCIN) 150 MG capsule Take 2 capsules (300 mg total) by mouth 3 (three) times daily. May dispense as 150mg  capsules 02/04/18   Noemi Chapel, MD  ketoconazole (NIZORAL) 2 % cream Apply 1 application topically 2 (two) times daily.    [provider]  NONFORMULARY OR COMPOUNDED ITEM Apply 1 application topically at bedtime as needed (for toe nails). Terbinafine1000MG /IBU2%/DMSO30ML    [provider]  ondansetron (ZOFRAN ODT) 4 MG disintegrating tablet Take 1 tablet (4 mg total) by mouth every 8 (eight) hours as needed for nausea. 02/04/18   Noemi Chapel, MD  Salicylic Acid 50 % SOLN Apply 1 application topically at bedtime.    [provider]  TERBINAFINE EX Apply topically.    [provider]    Family History Family History  Problem Relation Age of Onset  . Heart disease Father 36  . Atrial fibrillation Mother   . Cancer Mother        carcinoid  . Diabetes Maternal Grandfather   . Cancer Maternal Grandfather   . Heart disease Maternal Grandmother   . Thyroid disease Son     Social History Social History   Tobacco Use  . Smoking status: Current Every Day Smoker    Packs/day: 0.50    Years: 40.00    Pack years: 20.00    Types: Cigarettes    Start date: 03/03/1971  . Smokeless tobacco: Never Used  Substance Use Topics  . Alcohol use: No  . Drug use: No      Allergies   Codeine; Dilaudid [hydromorphone hcl]; Morphine  and related; Penicillins; and Sulfonamide derivatives   Review of Systems Review of Systems  All other systems reviewed and are negative.    Physical Exam Updated Vital Signs BP 138/83   Pulse 71   Temp 98.3 F (36.8 C) (Oral)   Resp 18   Ht 1.6 m (5\' 3" )   Wt 79.8 kg   SpO2 (!) 87%   BMI 31.18 kg/m   Physical Exam  Constitutional: She appears well-developed and well-nourished. No distress.  HENT:  Head: Normocephalic and atraumatic.  Mouth/Throat: Oropharynx is clear and moist. No oropharyngeal exudate.  There is no trismus or torticollis, the patient is able to fully open her mouth, her right lower jaw around the base of the second premolar is swollen with edema but no fluctuance.  There is a slight asymmetry of the right lower jaw.  She has no tenderness underneath the tongue with normal phonation, normal tongue protrusion and no lymphadenopathy underneath the jaw.  Eyes: Pupils are equal, round, and reactive to light. Conjunctivae and EOM are normal. Right eye exhibits no discharge. Left eye exhibits no discharge. No scleral icterus.  Neck: Normal range of motion. Neck supple. No JVD present. No thyromegaly present.  Cardiovascular: Normal rate, regular rhythm, normal heart sounds and intact distal pulses. Exam reveals no gallop and no friction rub.  No murmur heard. Pulmonary/Chest: Effort normal and breath sounds normal. No respiratory distress. She has no wheezes. She has no rales.  Abdominal: Soft. Bowel sounds are normal. She exhibits no distension and no mass. There is no tenderness.  Musculoskeletal: Normal range of motion. She exhibits no edema or tenderness.  Lymphadenopathy:    She has no cervical adenopathy.  Neurological: She is alert. Coordination normal.  Skin: Skin is warm and dry. No rash noted. No erythema.  Psychiatric: She has a normal mood and affect. Her behavior is normal.  Nursing  note and vitals reviewed.    ED Treatments / Results  Labs (all labs ordered are listed, but only abnormal results are displayed) Labs Reviewed  COMPREHENSIVE METABOLIC PANEL - Abnormal; Notable for the following components:      Result Value   Glucose, Bld 354 (*)    All other components within normal limits  CBC - Abnormal; Notable for the following components:   WBC 11.7 (*)    RBC 5.24 (*)    Hemoglobin 15.9 (*)    HCT 50.5 (*)    All other components within normal limits  URINALYSIS, ROUTINE W REFLEX MICROSCOPIC - Abnormal; Notable for the following components:   Specific Gravity, Urine 1.032 (*)    Glucose, UA >=500 (*)    All other components within normal limits  CBG MONITORING, ED - Abnormal; Notable for the following components:   Glucose-Capillary 452 (*)    All other components within normal limits    EKG None  Radiology No results found.  Procedures Procedures (including critical care time)  Medications Ordered in ED Medications  0.9 %  sodium chloride infusion ( Intravenous New Bag/Given 02/04/18 1328)  ondansetron (ZOFRAN) injection 4 mg (4 mg Intravenous Given 02/04/18 1330)  clindamycin (CLEOCIN) IVPB 600 mg (0 mg Intravenous Stopped 02/04/18 1444)  sodium chloride 0.9 % bolus 1,000 mL (0 mLs Intravenous Stopped 02/04/18 1444)     Initial Impression / Assessment and Plan / ED Course  I have reviewed the triage vital signs and the nursing notes.  Pertinent labs & imaging results that were available during my care of the patient  were reviewed by me and considered in my medical decision making (see chart for details).  Clinical Course as of Feb 04 1554  Fri Feb 04, 4461  8638 Metabolic panel shows a glucose of 354 with no signs of diabetic ketoacidosis, she will be transitioned onto oral clindamycin and discharged home, the patient is in agreement   [BM]    Clinical Course User Index [BM] Noemi Chapel, MD    The patient is hyperglycemic with a  blood sugar of 450, she has only a slight leukocytosis and has a sign of a dental abscess.  With a white blood cell count of 11,700, no tachycardia, no fever it is unlikely that she is septic.  I do not see any signs of Ludwig's angina.  I will make sure she is not a diabetic ketoacidosis and the patient will need to have some nausea medicine in addition to clindamycin for home.  She does have a penicillin allergy.  The patient is agreeable to the plan.  She does not need advanced imaging at this time.  I have rechecked the patient's oxygen level multiple times myself and each time it is 93% or more.  Her lungs are clear and she is having no shortness of breath.  Final Clinical Impressions(s) / ED Diagnoses   Final diagnoses:  Dental abscess  Hyperglycemia    ED Discharge Orders         Ordered    clindamycin (CLEOCIN) 150 MG capsule  3 times daily,   Status:  Discontinued     02/04/18 1552    ondansetron (ZOFRAN ODT) 4 MG disintegrating tablet  Every 8 hours PRN     02/04/18 1554    clindamycin (CLEOCIN) 150 MG capsule  3 times daily     02/04/18 1554           Noemi Chapel, MD 02/04/18 1555

## 2018-02-04 NOTE — ED Triage Notes (Addendum)
Pt reports lower right sided dental pain for "abscess". Pt reports was seen at dentist with no treatment.   Pt also reports emesis for last several weeks and reports has appointment with PCP and Endoscopy.  CBG in triage 452.

## 2018-02-04 NOTE — ED Notes (Signed)
Patient unable to void at this time

## 2018-02-07 NOTE — Telephone Encounter (Signed)
Pt notified and will start probiotic.

## 2018-02-07 NOTE — Telephone Encounter (Addendum)
Noted. She had significant hyperglycemia, mild leukocytosis, and dehydration.  She was given clindamycin for dental abscess.  Start probiotic or eat yogurt twice daily.   Currently scheduled for colonoscopy 02/17/18.   Make Dr. Gala Romney aware of pending EGD in case additional recommendations.

## 2018-02-07 NOTE — Telephone Encounter (Signed)
Pt went to the Ed on Friday. Pt has two abscesses on her teeth that will be treated. She was given some anti nausea medication, IV fluid and antibiotics for 10 days. Pt doesn't have any nausea at the moment.

## 2018-02-14 ENCOUNTER — Encounter (HOSPITAL_COMMUNITY)
Admission: RE | Admit: 2018-02-14 | Discharge: 2018-02-14 | Disposition: A | Discharge status: Home / Self Care | Payer: Medicare Other | Source: Ambulatory Visit | Attending: Internal Medicine | Admitting: Internal Medicine

## 2018-02-17 ENCOUNTER — Other Ambulatory Visit: Payer: Self-pay

## 2018-02-17 ENCOUNTER — Encounter (HOSPITAL_COMMUNITY)
Admission: RE | Disposition: A | Discharge status: Home / Self Care | Payer: Self-pay | Source: Ambulatory Visit | Attending: Internal Medicine

## 2018-02-17 ENCOUNTER — Ambulatory Visit (HOSPITAL_COMMUNITY): Payer: Medicare Other | Admitting: Anesthesiology

## 2018-02-17 ENCOUNTER — Encounter (HOSPITAL_COMMUNITY): Payer: Self-pay | Admitting: *Deleted

## 2018-02-17 ENCOUNTER — Ambulatory Visit (HOSPITAL_COMMUNITY)
Admission: RE | Admit: 2018-02-17 | Discharge: 2018-02-17 | Disposition: A | Discharge status: Home / Self Care | Payer: Medicare Other | Source: Ambulatory Visit | Attending: Internal Medicine | Admitting: Internal Medicine

## 2018-02-17 DIAGNOSIS — Z791 Long term (current) use of non-steroidal anti-inflammatories (NSAID): Secondary | ICD-10-CM | POA: Insufficient documentation

## 2018-02-17 DIAGNOSIS — E119 Type 2 diabetes mellitus without complications: Secondary | ICD-10-CM | POA: Diagnosis not present

## 2018-02-17 DIAGNOSIS — Z7984 Long term (current) use of oral hypoglycemic drugs: Secondary | ICD-10-CM | POA: Diagnosis not present

## 2018-02-17 DIAGNOSIS — F329 Major depressive disorder, single episode, unspecified: Secondary | ICD-10-CM | POA: Diagnosis not present

## 2018-02-17 DIAGNOSIS — G4733 Obstructive sleep apnea (adult) (pediatric): Secondary | ICD-10-CM | POA: Insufficient documentation

## 2018-02-17 DIAGNOSIS — Z79899 Other long term (current) drug therapy: Secondary | ICD-10-CM | POA: Diagnosis not present

## 2018-02-17 DIAGNOSIS — Z7982 Long term (current) use of aspirin: Secondary | ICD-10-CM | POA: Insufficient documentation

## 2018-02-17 DIAGNOSIS — K219 Gastro-esophageal reflux disease without esophagitis: Secondary | ICD-10-CM | POA: Diagnosis not present

## 2018-02-17 DIAGNOSIS — K2970 Gastritis, unspecified, without bleeding: Secondary | ICD-10-CM

## 2018-02-17 DIAGNOSIS — Z96641 Presence of right artificial hip joint: Secondary | ICD-10-CM | POA: Diagnosis not present

## 2018-02-17 DIAGNOSIS — K2270 Barrett's esophagus without dysplasia: Secondary | ICD-10-CM | POA: Diagnosis present

## 2018-02-17 DIAGNOSIS — K228 Other specified diseases of esophagus: Secondary | ICD-10-CM | POA: Diagnosis not present

## 2018-02-17 DIAGNOSIS — Z87820 Personal history of traumatic brain injury: Secondary | ICD-10-CM | POA: Diagnosis not present

## 2018-02-17 DIAGNOSIS — I10 Essential (primary) hypertension: Secondary | ICD-10-CM | POA: Insufficient documentation

## 2018-02-17 DIAGNOSIS — F419 Anxiety disorder, unspecified: Secondary | ICD-10-CM | POA: Diagnosis not present

## 2018-02-17 DIAGNOSIS — J439 Emphysema, unspecified: Secondary | ICD-10-CM | POA: Diagnosis not present

## 2018-02-17 HISTORY — PX: BIOPSY: SHX5522

## 2018-02-17 HISTORY — PX: ESOPHAGOGASTRODUODENOSCOPY (EGD) WITH PROPOFOL: SHX5813

## 2018-02-17 LAB — GLUCOSE, CAPILLARY: GLUCOSE-CAPILLARY: 167 mg/dL — AB (ref 70–99)

## 2018-02-17 SURGERY — ESOPHAGOGASTRODUODENOSCOPY (EGD) WITH PROPOFOL
Anesthesia: General

## 2018-02-17 MED ORDER — MIDAZOLAM HCL 2 MG/2ML IJ SOLN: 0.5000 mg | Freq: Once | INTRAMUSCULAR | Status: DC | PRN

## 2018-02-17 MED ORDER — PROPOFOL 500 MG/50ML IV EMUL
INTRAVENOUS | Status: DC | PRN
  Administered 2018-02-17: 125 ug/kg/min via INTRAVENOUS

## 2018-02-17 MED ORDER — LACTATED RINGERS IV SOLN
INTRAVENOUS | Status: DC | PRN
  Administered 2018-02-17: 12:00:00 via INTRAVENOUS

## 2018-02-17 MED ORDER — PROPOFOL 10 MG/ML IV BOLUS
INTRAVENOUS | Status: AC
  Filled 2018-02-17: qty 40

## 2018-02-17 MED ORDER — PROMETHAZINE HCL 25 MG/ML IJ SOLN: 6.2500 mg | INTRAMUSCULAR | Status: DC | PRN

## 2018-02-17 MED ORDER — LACTATED RINGERS IV SOLN: INTRAVENOUS | Status: DC

## 2018-02-17 MED ORDER — MIDAZOLAM HCL 5 MG/5ML IJ SOLN
INTRAMUSCULAR | Status: DC | PRN
  Administered 2018-02-17: 2 mg via INTRAVENOUS

## 2018-02-17 MED ORDER — MIDAZOLAM HCL 2 MG/2ML IJ SOLN
INTRAMUSCULAR | Status: AC
  Filled 2018-02-17: qty 2

## 2018-02-17 NOTE — Anesthesia Postprocedure Evaluation (Signed)
Anesthesia Post Note  Patient: Adrienne [name]  Procedure(s) Performed: ESOPHAGOGASTRODUODENOSCOPY (EGD) WITH PROPOFOL (N/A ) BIOPSY  Patient location during evaluation: PACU Anesthesia Type: General Level of consciousness: awake and patient cooperative Pain management: pain level controlled Vital Signs Assessment: post-procedure vital signs reviewed and stable Respiratory status: spontaneous breathing, nonlabored ventilation and respiratory function stable Cardiovascular status: blood pressure returned to baseline Postop Assessment: no apparent nausea or vomiting Anesthetic complications: no     Last Vitals:  Vitals:   02/17/18 1203  BP: 126/79  Pulse: 79  Resp: 20  Temp: 36.9 C  SpO2: 90%    Last Pain:  Vitals:   02/17/18 1203  TempSrc: Oral  PainSc: 0-No pain                 [name]

## 2018-02-17 NOTE — Progress Notes (Signed)
Patient has an abscess on a tooth on each side of her mouth. Patient has been on antibiotics since Thanksgiving and finished last night. Some pain still noted. Discussed with Dr. Gala Romney and Dr. Hilaria Ota. Will proceed with procedure.

## 2018-02-17 NOTE — Op Note (Signed)
[facility] Patient Name: Adrienne [name] Procedure Date: 02/17/2018 1:33 PM MRN: [id number] Date of Birth: January 09, 1956 Attending MD: Norvel Richards , MD CSN: [id number] Age: 62 Admit Type: Outpatient Procedure:                Upper GI endoscopy Indications:              Surveillance for malignancy due to personal history                            of Barrett's esophagus Providers:                Norvel Richards, MD, Lurline Del, RN, Gerome Sam, RN, Randa Spike, Technician Referring MD:              Medicines:                Propofol per Anesthesia Complications:            No immediate complications. Estimated Blood Loss:     Estimated blood loss was minimal. Procedure:                Pre-Anesthesia Assessment:                           - Prior to the procedure, a History and Physical                            was performed, and patient medications and                            allergies were reviewed. The patient's tolerance of                            previous anesthesia was also reviewed. The risks                            and benefits of the procedure and the sedation                            options and risks were discussed with the patient.                            All questions were answered, and informed consent                            was obtained. Prior Anticoagulants: The patient has                            taken no previous anticoagulant or antiplatelet                            agents. ASA Grade Assessment: II - A patient with  mild systemic disease. After reviewing the risks                            and benefits, the patient was deemed in                            satisfactory condition to undergo the procedure.                           After obtaining informed consent, the endoscope was                            passed under direct vision. Throughout the        procedure, the patient's blood pressure, pulse, and                            oxygen saturations were monitored continuously. The                            [id number]-[id number] ([id number]) scope was introduced through the                            and advanced to the second part of duodenum. The                            upper GI endoscopy was accomplished without                            difficulty. The patient tolerated the procedure                            well. Scope In: 2:00:18 PM Scope Out: 2:07:59 PM Total Procedure Duration: 0 hours 7 minutes 41 seconds  Findings:      3 tongues of salmon-colored epithelium coming up above the GE junction.       The longest and widest centimeters arise 3 cm above the GE junction. No       esophagitis. No nodularity. This was biopsied with a cold forceps for       histology. Estimated blood loss was minimal.      Localized moderate inflammation characterized by congestion (edema) and       erosions was found in the gastric body. This was biopsied with a cold       forceps for histology. Estimated blood loss was minimal.      The duodenal bulb and second portion of the duodenum were normal. Impression:               - Mucosal changes in the esophagus. Biopsied.                           - Gastritis. Biopsied.                           - Normal duodenal bulb and second portion of the  duodenum. Moderate Sedation:      Moderate (conscious) sedation was personally administered by an       anesthesia professional. The following parameters were monitored: oxygen       saturation, heart rate, blood pressure, respiratory rate, EKG, adequacy       of pulmonary ventilation, and response to care. Recommendation:           - Patient has a contact number available for                            emergencies. The signs and symptoms of potential                            delayed complications were discussed with the                             patient. Return to normal activities tomorrow.                            Written discharge instructions were provided to the                            patient.                           - Resume previous diet.                           - Continue present medications.                           - Repeat upper endoscopy after studies are complete                            for surveillance based on pathology results.                           - Return to GI clinic in 1 year. Procedure Code(s):        --- Professional ---                           [phone], Esophagogastroduodenoscopy, flexible,                            transoral; with biopsy, single or multiple Diagnosis Code(s):        --- Professional ---                           K22.8, Other specified diseases of esophagus                           K29.70, Gastritis, unspecified, without bleeding                           K22.70, Barrett's esophagus without dysplasia CPT copyright 2018 [employer]. All rights reserved. The codes documented in this report are preliminary and upon  coder review may  be revised to meet current compliance requirements. Cristopher Estimable. Colin Norment, MD Norvel Richards, MD 02/17/2018 2:17:12 PM This report has been signed electronically. Number of Addenda: 0

## 2018-02-17 NOTE — H&P (Signed)
@LOGO @   Primary Care Physician:  Asencion Noble, MD Primary Gastroenterologist:  Dr. Gala Romney  Pre-Procedure History & Physical: HPI:  Adrienne [name] is a 62 y.o. female here for Barrett's esophagus surveillance.  Reflux symptoms well controlled on omeprazole 20 mg twice daily.  No dysphagia.   Past Medical History:  Diagnosis Date  . Allergic rhinitis   . Allergy   . Anemia    leukocytosis/thrombocytosis--Dr. Tressie Stalker  . Anxiety   . Arthritis   . Asthma   . 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 Closed head injury 2005  . Complication of anesthesia    "my blood pressure dropped"  . COPD (chronic obstructive pulmonary disease) ([place])   . Depression    ? Bipolar Disorder--Dr, Jimmye Norman  . Emphysema of lung ([place])   . GERD (gastroesophageal reflux disease)    Barrett's esophagus  . Headache    migraines  . Hx of migraines   . Hypertension   . Low back pain   . Macular degeneration, bilateral    Dry-Dr. Iona Hansen  . Neuromuscular disorder ([place])   . NIDDM (non-insulin dependent diabetes mellitus)   . OSA (obstructive sleep apnea)    cannot tolerate mask  . RLS (restless legs syndrome)   . Seizure disorder ([place])    ? absence; due to head truama  . Seizures ([place])   . Sleep apnea   . Status post right hip replacement   . Status post rotator cuff surgery     Past Surgical History:  Procedure Laterality Date  . ABDOMINAL HYSTERECTOMY     heavy bleeding  . APPENDECTOMY    . BIOPSY  12/27/2014   Procedure: Esophageal BIOPSY;  Surgeon: Daneil Dolin, MD;  Location: [facility];  Service: Endoscopy;;  . COLONOSCOPY WITH ESOPHAGOGASTRODUODENOSCOPY (EGD)  12/08   TCS/EGD: + Barrett's,+ gastritis, friable anal canal, hyperplastic polyp  . COLONOSCOPY WITH PROPOFOL N/A 12/27/2014   RMR: Internal hemorrhoids likely source of hematochezia. Redundant colon.   . ESOPHAGOGASTRODUODENOSCOPY  11/16/2002   Dr.Kaydi Kley- salmon-colored epithelium in the upper esophageal sphincter previous bx= ectopic  gastric mucosa, salmon colored epithelium at the distal esophagus proviously bx= barretts esophagus o/w the esophageal mucosa appeared normal. patulous esophagogastric junction, small hiatal hernia, gastric mucosa o/w appeared normal. normal D1 and D2  . ESOPHAGOGASTRODUODENOSCOPY (EGD) WITH PROPOFOL N/A 12/27/2014   RMR: Abnoraml distal esophagus consistiant with prior diagnosis fo Barretts esophagus- Status post biopsy. Hiatal hernia. Antral erosions status post gastric biopsy,  . FRACTURE SURGERY Left 03/07/2013   knee  . fx left knee Left 2014  . ORIF TIBIA PLATEAU Left 03/07/2013   Procedure: OPEN REDUCTION INTERNAL FIXATION (ORIF) TIBIAL PLATEAU;  Surgeon: Renette Butters, MD;  Location: [facility];  Service: Orthopedics;  Laterality: Left;  . REVISION TOTAL HIP ARTHROPLASTY     Right  . ROTATOR CUFF REPAIR     Bilaterally  . SHOULDER INJECTION Left 03/07/2013   Procedure: SHOULDER INJECTION ; Depomedrol + 0.25% Marcaine mixture 4:1;  Surgeon: Renette Butters, MD;  Location: [facility];  Service: Orthopedics;  Laterality: Left;  . TONSILLECTOMY    . TOTAL HIP ARTHROPLASTY Left 06/01/2016   Procedure: LEFT TOTAL HIP ARTHROPLASTY ANTERIOR APPROACH;  Surgeon: Gaynelle Arabian, MD;  Location: [facility];  Service: Orthopedics;  Laterality: Left;  [name] VESICOVAGINAL FISTULA CLOSURE W/ TAH      Prior to Admission medications   Medication Sig Start Date End Date Taking? Authorizing Provider  ALPRAZolam Duanne Moron) 1 MG  tablet Take 1 tablet (1 mg total) by mouth at bedtime as needed. Patient taking differently: Take 1-2 mg by mouth See admin instructions. Take 1 mg by mouth in the morning, take 1 mg by mouth at 1200, take 1 mg by mouth at 1600 and take 2 mg by mouth at bedtime 03/13/13  Yes Reed, Tiffany L, DO  aspirin 81 MG tablet Take 81 mg by mouth daily.   Yes [provider]  B Complex-C (SUPER B COMPLEX PO) Take 1 tablet by mouth daily.   Yes [provider]  Biotin 10 MG CAPS Take 10 mg by  mouth daily.    Yes [provider]  Cholecalciferol (D3-1000) 25 MCG (1000 UT) capsule Take 1,000 Units by mouth daily.    Yes [provider]  CINNAMON PO Take 1,000 mg by mouth 2 (two) times daily.    Yes [provider]  clindamycin (CLEOCIN) 150 MG capsule Take 2 capsules (300 mg total) by mouth 3 (three) times daily. May dispense as 150mg  capsules 02/04/18  Yes Noemi Chapel, MD  cyclobenzaprine (FLEXERIL) 10 MG tablet Take 30 mg by mouth at bedtime.    Yes [provider]  Flaxseed, Linseed, (FLAX SEED OIL PO) Take 1,200 mg by mouth daily.   Yes [provider]  hydrocortisone (ANUSOL-HC) 2.5 % rectal cream Place 1 application rectally 2 (two) times daily. Patient taking differently: Place 1 application rectally 2 (two) times daily as needed for hemorrhoids or anal itching.  07/08/17  Yes Annitta Needs, NP  ketoconazole (NIZORAL) 2 % cream Apply 1 application topically 2 (two) times daily.   Yes [provider]  lithium carbonate (LITHOBID) 300 MG CR tablet Take 600 mg by mouth at bedtime.  07/05/17  Yes [provider]  methylphenidate (RITALIN) 20 MG tablet Take 20 mg by mouth 3 (three) times daily with meals.    Yes [provider]  naproxen sodium (ALEVE) 220 MG tablet Take 440 mg by mouth 2 (two) times daily as needed (for pain or headache).    Yes [provider]  NONFORMULARY OR COMPOUNDED ITEM Apply 1 application topically at bedtime as needed (for toe nails). Terbinafine1000MG /IBU2%/DMSO30ML   Yes [provider]  Omega-3 Fatty Acids (FISH OIL) 1000 MG CAPS Take 1,000 mg by mouth daily.   Yes [provider]  omeprazole (PRILOSEC) 20 MG capsule TAKE (1) CAPSULE BY MOUTH TWICE DAILY. Patient taking differently: Take 20 mg by mouth 2 (two) times daily before a meal.  10/07/17  Yes Annitta Needs, NP  ondansetron (ZOFRAN ODT) 4 MG disintegrating tablet Take 1 tablet (4 mg total) by mouth every 8  (eight) hours as needed for nausea. 02/04/18  Yes Noemi Chapel, MD  prazosin (MINIPRESS) 1 MG capsule Take 1 mg by mouth at bedtime.   Yes [provider]  rosuvastatin (CRESTOR) 20 MG tablet TAKE (1) TABLET BY MOUTH ONCE DAILY. Patient taking differently: Take 20 mg by mouth daily.  02/11/17  Yes Raylene Everts, MD  Salicylic Acid 50 % SOLN Apply 1 application topically at bedtime.   Yes [provider]  TERBINAFINE EX Apply topically.   Yes [provider]  venlafaxine XR (EFFEXOR-XR) 150 MG 24 hr capsule Take 150 mg by mouth daily with breakfast.   Yes [provider]  vitamin E 400 UNIT capsule Take 400 Units by mouth daily.   Yes [provider]  metFORMIN (GLUCOPHAGE) 1000 MG tablet Take 1 tablet (1,000  mg total) by mouth 2 (two) times daily with a meal. 12/10/16   Raylene Everts, MD  Multiple Vitamins-Minerals (OCUVITE ADULT 50+ PO) Take 1 capsule by mouth daily.    [provider]    Allergies as of 12/28/2017 - Review Complete 11/25/2017  Allergen Reaction Noted  . Codeine    . Dilaudid [hydromorphone hcl] Itching 05/23/2013  . Morphine and related Itching 03/02/2013  . Penicillins Rash   . Sulfonamide derivatives Rash     Family History  Problem Relation Age of Onset  . Heart disease Father 43  . Atrial fibrillation Mother   . Cancer Mother        carcinoid  . Diabetes Maternal Grandfather   . Cancer Maternal Grandfather   . Heart disease Maternal Grandmother   . Thyroid disease Son     Social History   Socioeconomic History  . Marital status: Divorced    Spouse name: Not on file  . Number of children: 3  . Years of education: 47  . Highest education level: Not on file  Occupational History  . Occupation: on disability    Employer: UNEMPLOYED    Comment: due to head injury, 2005  Social Needs  . Financial resource strain: Not on file  . Food insecurity:    Worry: Not on file    Inability: Not on  file  . Transportation needs:    Medical: Not on file    Non-medical: Not on file  Tobacco Use  . Smoking status: Current Every Day Smoker    Packs/day: 0.50    Years: 40.00    Pack years: 20.00    Types: Cigarettes    Start date: 03/03/1971  . Smokeless tobacco: Never Used  Substance and Sexual Activity  . Alcohol use: No  . Drug use: No  . Sexual activity: Yes    Birth control/protection: Surgical  Lifestyle  . Physical activity:    Days per week: Not on file    Minutes per session: Not on file  . Stress: Not on file  Relationships  . Social connections:    Talks on phone: Not on file    Gets together: Not on file    Attends religious service: Not on file    Active member of club or organization: Not on file    Attends meetings of clubs or organizations: Not on file    Relationship status: Not on file  . Intimate partner violence:    Fear of current or ex partner: Not on file    Emotionally abused: Not on file    Physically abused: Not on file    Forced sexual activity: Not on file  Other Topics Concern  . Not on file  Social History Narrative   Lives alone   Two big dogs   Enjoys yard work, home improvement    Review of Systems: See HPI, otherwise negative ROS  Physical Exam: BP 126/79   Pulse 79   Temp 98.4 F (36.9 C) (Oral)   Resp 20   Ht 5\' 3"  (1.6 m)   Wt 79.8 kg   SpO2 90%   BMI 31.16 kg/m  General:   Alert,  Well-developed, well-nourished, pleasant and cooperative in NAD Neck:  Supple; no masses or thyromegaly. No significant cervical adenopathy. Lungs:  Clear throughout to auscultation.   No wheezes, crackles, or rhonchi. No acute distress. Heart:  Regular rate and rhythm; no murmurs, clicks, rubs,  or gallops. Abdomen: Non-distended, normal bowel sounds.  Soft and nontender without appreciable mass or hepatosplenomegaly.  Pulses:  Normal pulses noted. Extremities:  Without clubbing or edema.  Impression/Plan: 62 year old lady with  GERD/short segment Barrett's.  GERD well-controlled.  Surveillance EGD today per plan.  The risks, benefits, limitations, alternatives and imponderables have been reviewed with the patient. Potential for esophageal dilation, biopsy, etc. have also been reviewed.  Questions have been answered. All parties agreeable.     Notice: This dictation was prepared with Dragon dictation along with smaller phrase technology. Any transcriptional errors that result from this process are unintentional and may not be corrected upon review.

## 2018-02-17 NOTE — Discharge Instructions (Signed)
EGD Discharge instructions Please read the instructions outlined below and refer to this sheet in the next few weeks. These discharge instructions provide you with general information on caring for yourself after you leave the hospital. Your doctor may also give you specific instructions. While your treatment has been planned according to the most current medical practices available, unavoidable complications occasionally occur. If you have any problems or questions after discharge, please call your doctor. ACTIVITY  You may resume your regular activity but move at a slower pace for the next 24 hours.   Take frequent rest periods for the next 24 hours.   Walking will help expel (get rid of) the air and reduce the bloated feeling in your abdomen.   No driving for 24 hours (because of the anesthesia (medicine) used during the test).   You may shower.   Do not sign any important legal documents or operate any machinery for 24 hours (because of the anesthesia used during the test).  NUTRITION  Drink plenty of fluids.   You may resume your normal diet.   Begin with a light meal and progress to your normal diet.   Avoid alcoholic beverages for 24 hours or as instructed by your caregiver.  MEDICATIONS  You may resume your normal medications unless your caregiver tells you otherwise.  WHAT YOU CAN EXPECT TODAY  You may experience abdominal discomfort such as a feeling of fullness or gas pains.  FOLLOW-UP  Your doctor will discuss the results of your test with you.  SEEK IMMEDIATE MEDICAL ATTENTION IF ANY OF THE FOLLOWING OCCUR:  Excessive nausea (feeling sick to your stomach) and/or vomiting.   Severe abdominal pain and distention (swelling).   Trouble swallowing.   Temperature over 101 F (37.8 C).   Rectal bleeding or vomiting of blood.   Information on Barrett's esophagus and GERD provided  Continue omeprazole 20 mg twice daily  Office visit with [place] in 1  year  Further recommendations to follow pending review of pathology report  PATIENT INSTRUCTIONS POST-ANESTHESIA  IMMEDIATELY FOLLOWING SURGERY:  Do not drive or operate machinery for the first twenty four hours after surgery.  Do not make any important decisions for twenty four hours after surgery or while taking narcotic pain medications or sedatives.  If you develop intractable nausea and vomiting or a severe headache please notify your doctor immediately.  FOLLOW-UP:  Please make an appointment with your surgeon as instructed. You do not need to follow up with anesthesia unless specifically instructed to do so.  WOUND CARE INSTRUCTIONS (if applicable):  Keep a dry clean dressing on the anesthesia/puncture wound site if there is drainage.  Once the wound has quit draining you may leave it open to air.  Generally you should leave the bandage intact for twenty four hours unless there is drainage.  If the epidural site drains for more than 36-48 hours please call the anesthesia department.  QUESTIONS?:  Please feel free to call your physician or the hospital operator if you have any questions, and they will be happy to assist you.      Barrett Esophagus Barrett esophagus occurs when the tissue that lines the esophagus changes or becomes damaged. The esophagus is the tube that carries food from the throat to the stomach. With Barrett esophagus, the cells that line the esophagus are replaced by cells that are similar to the lining of the intestines (intestinal metaplasia). Barrett esophagus itself may not cause any symptoms. However, many people who have Barrett  esophagus also have gastroesophageal reflux disease (GERD), which may cause symptoms such as heartburn. Treatment may include medicines, procedures to destroy the abnormal cells, or surgery. Over time, a few people with this condition may develop cancer of the esophagus. What are the causes? The exact cause of this condition is not known.  In some cases, the condition develops from damage to the lining of the esophagus caused by GERD. GERD occurs when stomach acids flow up from the stomach into the esophagus. Frequent symptoms of GERD may cause intestinal metaplasia or cause cell changes (dysplasia). What increases the risk? The following factors may make you more likely to develop this condition:  Having GERD.  Being any of the following: ? Female. ? White (Caucasian). ? Obese. ? Older than 50.  Having a hiatal hernia.  Smoking.  What are the signs or symptoms? People with Barrett esophagus often have no symptoms. However, many people with this condition also have GERD. Symptoms of GERD may include:  Heartburn.  Difficulty swallowing.  Dry cough.  How is this diagnosed? Barrett esophagus may be diagnosed with an exam called an upper gastrointestinal endoscopy. During this exam, a thin, flexible tube (endoscope) is passed down your esophagus. The endoscope has a light and camera on the end of it. Your health care provider uses the endoscope to view the inside of your esophagus. During the exam, several tissue samples will be removed (biopsy) from your esophagus so they can be checked for intestinal metaplasia or dysplasia. How is this treated? Treatment for this condition may include:  Medicines (proton pump inhibitors, or PPIs) to decrease or stop GERD.  Periodic endoscopic exams to make sure that cancer is not developing.  A procedure or surgery for dysplasia. This may include: ? Endoscopic removal or destruction of abnormal cells. ? Removal of part of the esophagus (esophagectomy).  Follow these instructions at home: Eating and drinking  Eat more fruits and vegetables.  Avoid fatty foods.  Eat small, frequent meals instead of large meals.  Avoid foods that cause heartburn. These foods include: ? Coffee and alcoholic drinks. ? Tomatoes and foods made with tomatoes. ? Greasy or spicy foods. ? Chocolate  and peppermint. General instructions   Take over-the-counter and prescription medicines only as told by your health care provider.  Do not use any tobacco products, such as cigarettes, chewing tobacco, and e-cigarettes. If you need help quitting, ask your health care provider.  If your health care provider is treating you for GERD, make sure you follow all instructions and take medicines as directed.  Keep all follow-up visits as told by your health care provider. This is important. Contact a health care provider if:  You have heartburn or GERD symptoms.  You have difficulty swallowing. Get help right away if:  You have chest pain.  You are unable to swallow.  You vomit blood or material that looks like coffee grounds.  Your stool (feces) is bright red or dark. This information is not intended to replace advice given to you by your health care provider. Make sure you discuss any questions you have with your health care provider. Document Released: 05/16/2003 Document Revised: 08/01/2015 Document Reviewed: 12/06/2014 Elsevier Interactive Patient Education  2018 [employer].   Gastroesophageal Reflux Disease, Adult Normally, food travels down the esophagus and stays in the stomach to be digested. If a person has gastroesophageal reflux disease (GERD), food and stomach acid move back up into the esophagus. When this happens, the esophagus becomes sore and  swollen (inflamed). Over time, GERD can make small holes (ulcers) in the lining of the esophagus. Follow these instructions at home: Diet  Follow a diet as told by your doctor. You may need to avoid foods and drinks such as: ? Coffee and tea (with or without caffeine). ? Drinks that contain alcohol. ? Energy drinks and sports drinks. ? Carbonated drinks or sodas. ? Chocolate and cocoa. ? Peppermint and mint flavorings. ? Garlic and onions. ? Horseradish. ? Spicy and acidic foods, such as peppers, chili powder, curry  powder, vinegar, hot sauces, and BBQ sauce. ? Citrus fruit juices and citrus fruits, such as oranges, lemons, and limes. ? Tomato-based foods, such as red sauce, chili, salsa, and pizza with red sauce. ? Fried and fatty foods, such as donuts, french fries, potato chips, and high-fat dressings. ? High-fat meats, such as hot dogs, rib eye steak, sausage, ham, and bacon. ? High-fat dairy items, such as whole milk, butter, and cream cheese.  Eat small meals often. Avoid eating large meals.  Avoid drinking large amounts of liquid with your meals.  Avoid eating meals during the 2-3 hours before bedtime.  Avoid lying down right after you eat.  Do not exercise right after you eat. General instructions  Pay attention to any changes in your symptoms.  Take over-the-counter and prescription medicines only as told by your doctor. Do not take aspirin, ibuprofen, or other NSAIDs unless your doctor says it is okay.  Do not use any tobacco products, including cigarettes, chewing tobacco, and e-cigarettes. If you need help quitting, ask your doctor.  Wear loose clothes. Do not wear anything tight around your waist.  Raise (elevate) the head of your bed about 6 inches (15 cm).  Try to lower your stress. If you need help doing this, ask your doctor.  If you are overweight, lose an amount of weight that is healthy for you. Ask your doctor about a safe weight loss goal.  Keep all follow-up visits as told by your doctor. This is important. Contact a doctor if:  You have new symptoms.  You lose weight and you do not know why it is happening.  You have trouble swallowing, or it hurts to swallow.  You have wheezing or a cough that keeps happening.  Your symptoms do not get better with treatment.  You have a hoarse voice. Get help right away if:  You have pain in your arms, neck, jaw, teeth, or back.  You feel sweaty, dizzy, or light-headed.  You have chest pain or shortness of  breath.  You throw up (vomit) and your throw up looks like blood or coffee grounds.  You pass out (faint).  Your poop (stool) is bloody or black.  You cannot swallow, drink, or eat. This information is not intended to replace advice given to you by your health care provider. Make sure you discuss any questions you have with your health care provider. Document Released: 08/12/2007 Document Revised: 08/01/2015 Document Reviewed: 06/20/2014 Elsevier Interactive Patient Education  [employer].

## 2018-02-17 NOTE — Transfer of Care (Signed)
Immediate Anesthesia Transfer of Care Note  Patient: Adrienne [name]  Procedure(s) Performed: ESOPHAGOGASTRODUODENOSCOPY (EGD) WITH PROPOFOL (N/A ) BIOPSY  Patient Location: PACU  Anesthesia Type:General  Level of Consciousness: awake and patient cooperative  Airway & Oxygen Therapy: Patient Spontanous Breathing and Patient connected to nasal cannula oxygen  Post-op Assessment: Report given to RN and Post -op Vital signs reviewed and stable  Post vital signs: Reviewed and stable  Last Vitals:  Vitals Value Taken Time  BP    Temp    Pulse    Resp    SpO2      Last Pain:  Vitals:   02/17/18 1203  TempSrc: Oral  PainSc: 0-No pain      Patients Stated Pain Goal: 6 (29/92/42 [id number])  Complications: No apparent anesthesia complications

## 2018-02-17 NOTE — Anesthesia Preprocedure Evaluation (Signed)
Anesthesia Evaluation  Patient identified by MRN, date of birth, ID band Patient awake  General Assessment Comment:States dropped BP once with anesthesia - denies being admitted after   Reviewed: Allergy & Precautions, NPO status , Patient's Chart, lab work & pertinent test results  History of Anesthesia Complications (+) history of anesthetic complications  Airway Mallampati: II  TM Distance: >3 FB Neck ROM: Full    Dental no notable dental hx. (+) Poor Dentition, Missing Pt report two abscesses in the recent past  I was unable to see anything actively draining:   Pulmonary asthma , sleep apnea , COPD, Current Smoker,    Pulmonary exam normal breath sounds clear to auscultation + decreased breath sounds      Cardiovascular Exercise Tolerance: Good hypertension, Pt. on medications negative cardio ROS Normal cardiovascular examI Rhythm:Regular Rate:Normal  Pt denies CP Has normal ECG   Neuro/Psych  Headaches, Seizures -, Well Controlled,  Anxiety Depression Bipolar Disorder  Neuromuscular disease negative psych ROS   GI/Hepatic Neg liver ROS, GERD  Medicated and Controlled,H/o Barretts on BID meds  Denies any GERD SX today  Reports Bilat teeth abscesses in the recent past - on Cleocin since Thanksgiving  D/W pt slightly increased risk in the face of the mouth issues  Pt voiced understanding and WTP   Endo/Other  negative endocrine ROSdiabetes  Renal/GU negative Renal ROS  negative genitourinary   Musculoskeletal  (+) Arthritis , Osteoarthritis,    Abdominal   Peds negative pediatric ROS (+)  Hematology negative hematology ROS (+) anemia ,   Anesthesia Other Findings   Reproductive/Obstetrics negative OB ROS                             Anesthesia Physical Anesthesia Plan  ASA: III  Anesthesia Plan: General   Post-op Pain Management:    Induction: Intravenous  PONV Risk Score  and Plan:   Airway Management Planned: Nasal Cannula and Simple Face Mask  Additional Equipment:   Intra-op Plan:   Post-operative Plan:   Informed Consent: I have reviewed the patients History and Physical, chart, labs and discussed the procedure including the risks, benefits and alternatives for the proposed anesthesia with the patient or authorized representative who has indicated his/her understanding and acceptance.   Dental advisory given  Plan Discussed with: CRNA  Anesthesia Plan Comments:         Anesthesia Quick Evaluation

## 2018-02-22 ENCOUNTER — Encounter (HOSPITAL_COMMUNITY): Payer: Self-pay | Admitting: Internal Medicine

## 2018-02-27 ENCOUNTER — Encounter: Payer: Self-pay | Admitting: Internal Medicine

## 2018-03-10 ENCOUNTER — Encounter (HOSPITAL_COMMUNITY): Payer: Self-pay | Admitting: Internal Medicine

## 2018-04-25 ENCOUNTER — Other Ambulatory Visit: Payer: Self-pay | Admitting: Gastroenterology

## 2018-09-02 ENCOUNTER — Encounter (HOSPITAL_COMMUNITY): Payer: Self-pay | Admitting: Emergency Medicine

## 2018-09-02 ENCOUNTER — Other Ambulatory Visit: Payer: Self-pay

## 2018-09-02 ENCOUNTER — Emergency Department (HOSPITAL_COMMUNITY): Payer: Medicare Other

## 2018-09-02 ENCOUNTER — Inpatient Hospital Stay (HOSPITAL_COMMUNITY)
Admission: EM | Admit: 2018-09-02 | Discharge: 2018-09-09 | DRG: 856 | Disposition: A | Discharge status: Home Health | Payer: Medicare Other | Attending: Internal Medicine | Admitting: Internal Medicine

## 2018-09-02 DIAGNOSIS — G40909 Epilepsy, unspecified, not intractable, without status epilepticus: Secondary | ICD-10-CM | POA: Diagnosis not present

## 2018-09-02 DIAGNOSIS — E1169 Type 2 diabetes mellitus with other specified complication: Secondary | ICD-10-CM | POA: Diagnosis present

## 2018-09-02 DIAGNOSIS — E785 Hyperlipidemia, unspecified: Secondary | ICD-10-CM | POA: Diagnosis not present

## 2018-09-02 DIAGNOSIS — B954 Other streptococcus as the cause of diseases classified elsewhere: Secondary | ICD-10-CM

## 2018-09-02 DIAGNOSIS — Z880 Allergy status to penicillin: Secondary | ICD-10-CM

## 2018-09-02 DIAGNOSIS — E872 Acidosis: Secondary | ICD-10-CM | POA: Diagnosis present

## 2018-09-02 DIAGNOSIS — T8144XA Sepsis following a procedure, initial encounter: Secondary | ICD-10-CM | POA: Diagnosis present

## 2018-09-02 DIAGNOSIS — Z885 Allergy status to narcotic agent status: Secondary | ICD-10-CM

## 2018-09-02 DIAGNOSIS — G2581 Restless legs syndrome: Secondary | ICD-10-CM | POA: Diagnosis present

## 2018-09-02 DIAGNOSIS — Z7984 Long term (current) use of oral hypoglycemic drugs: Secondary | ICD-10-CM

## 2018-09-02 DIAGNOSIS — Z7982 Long term (current) use of aspirin: Secondary | ICD-10-CM | POA: Diagnosis not present

## 2018-09-02 DIAGNOSIS — Z883 Allergy status to other anti-infective agents status: Secondary | ICD-10-CM

## 2018-09-02 DIAGNOSIS — F329 Major depressive disorder, single episode, unspecified: Secondary | ICD-10-CM | POA: Diagnosis not present

## 2018-09-02 DIAGNOSIS — J45909 Unspecified asthma, uncomplicated: Secondary | ICD-10-CM | POA: Diagnosis not present

## 2018-09-02 DIAGNOSIS — F419 Anxiety disorder, unspecified: Secondary | ICD-10-CM | POA: Diagnosis not present

## 2018-09-02 DIAGNOSIS — A408 Other streptococcal sepsis: Secondary | ICD-10-CM | POA: Diagnosis present

## 2018-09-02 DIAGNOSIS — K122 Cellulitis and abscess of mouth: Secondary | ICD-10-CM | POA: Diagnosis present

## 2018-09-02 DIAGNOSIS — Z96643 Presence of artificial hip joint, bilateral: Secondary | ICD-10-CM | POA: Diagnosis present

## 2018-09-02 DIAGNOSIS — I10 Essential (primary) hypertension: Secondary | ICD-10-CM | POA: Diagnosis present

## 2018-09-02 DIAGNOSIS — K219 Gastro-esophageal reflux disease without esophagitis: Secondary | ICD-10-CM | POA: Diagnosis present

## 2018-09-02 DIAGNOSIS — H3530 Unspecified macular degeneration: Secondary | ICD-10-CM | POA: Diagnosis not present

## 2018-09-02 DIAGNOSIS — T8140XA Infection following a procedure, unspecified, initial encounter: Secondary | ICD-10-CM | POA: Diagnosis present

## 2018-09-02 DIAGNOSIS — D72825 Bandemia: Secondary | ICD-10-CM | POA: Diagnosis not present

## 2018-09-02 DIAGNOSIS — Z833 Family history of diabetes mellitus: Secondary | ICD-10-CM | POA: Diagnosis not present

## 2018-09-02 DIAGNOSIS — J432 Centrilobular emphysema: Secondary | ICD-10-CM | POA: Diagnosis present

## 2018-09-02 DIAGNOSIS — G4730 Sleep apnea, unspecified: Secondary | ICD-10-CM | POA: Diagnosis not present

## 2018-09-02 DIAGNOSIS — A419 Sepsis, unspecified organism: Secondary | ICD-10-CM

## 2018-09-02 DIAGNOSIS — Z882 Allergy status to sulfonamides status: Secondary | ICD-10-CM

## 2018-09-02 DIAGNOSIS — F319 Bipolar disorder, unspecified: Secondary | ICD-10-CM | POA: Diagnosis present

## 2018-09-02 DIAGNOSIS — K089 Disorder of teeth and supporting structures, unspecified: Secondary | ICD-10-CM

## 2018-09-02 DIAGNOSIS — E119 Type 2 diabetes mellitus without complications: Secondary | ICD-10-CM

## 2018-09-02 DIAGNOSIS — E1165 Type 2 diabetes mellitus with hyperglycemia: Secondary | ICD-10-CM | POA: Diagnosis present

## 2018-09-02 DIAGNOSIS — Z8249 Family history of ischemic heart disease and other diseases of the circulatory system: Secondary | ICD-10-CM | POA: Diagnosis not present

## 2018-09-02 DIAGNOSIS — Z978 Presence of other specified devices: Secondary | ICD-10-CM | POA: Diagnosis not present

## 2018-09-02 DIAGNOSIS — L0211 Cutaneous abscess of neck: Secondary | ICD-10-CM | POA: Diagnosis present

## 2018-09-02 DIAGNOSIS — F17210 Nicotine dependence, cigarettes, uncomplicated: Secondary | ICD-10-CM | POA: Diagnosis present

## 2018-09-02 DIAGNOSIS — M869 Osteomyelitis, unspecified: Secondary | ICD-10-CM | POA: Diagnosis not present

## 2018-09-02 DIAGNOSIS — L03221 Cellulitis of neck: Secondary | ICD-10-CM | POA: Diagnosis present

## 2018-09-02 DIAGNOSIS — M8618 Other acute osteomyelitis, other site: Secondary | ICD-10-CM | POA: Diagnosis not present

## 2018-09-02 DIAGNOSIS — M272 Inflammatory conditions of jaws: Secondary | ICD-10-CM | POA: Diagnosis present

## 2018-09-02 DIAGNOSIS — G4733 Obstructive sleep apnea (adult) (pediatric): Secondary | ICD-10-CM | POA: Diagnosis present

## 2018-09-02 DIAGNOSIS — R0602 Shortness of breath: Secondary | ICD-10-CM

## 2018-09-02 DIAGNOSIS — Z1159 Encounter for screening for other viral diseases: Secondary | ICD-10-CM

## 2018-09-02 DIAGNOSIS — Z809 Family history of malignant neoplasm, unspecified: Secondary | ICD-10-CM | POA: Diagnosis not present

## 2018-09-02 DIAGNOSIS — J439 Emphysema, unspecified: Secondary | ICD-10-CM | POA: Diagnosis not present

## 2018-09-02 LAB — CBC WITH DIFFERENTIAL/PLATELET
Abs Immature Granulocytes: 0.16 10*3/uL — ABNORMAL HIGH (ref 0.00–0.07)
Basophils Absolute: 0.1 10*3/uL (ref 0.0–0.1)
Basophils Relative: 0 %
Eosinophils Absolute: 0 10*3/uL (ref 0.0–0.5)
Eosinophils Relative: 0 %
HCT: 42.3 % (ref 36.0–46.0)
Hemoglobin: 13.7 g/dL (ref 12.0–15.0)
Immature Granulocytes: 1 %
Lymphocytes Relative: 6 %
Lymphs Abs: 1.6 10*3/uL (ref 0.7–4.0)
MCH: 29.8 pg (ref 26.0–34.0)
MCHC: 32.4 g/dL (ref 30.0–36.0)
MCV: 92 fL (ref 80.0–100.0)
Monocytes Absolute: 2.2 10*3/uL — ABNORMAL HIGH (ref 0.1–1.0)
Monocytes Relative: 9 %
Neutro Abs: 20.1 10*3/uL — ABNORMAL HIGH (ref 1.7–7.7)
Neutrophils Relative %: 84 %
Platelets: 364 10*3/uL (ref 150–400)
RBC: 4.6 MIL/uL (ref 3.87–5.11)
RDW: 13.4 % (ref 11.5–15.5)
WBC: 24.2 10*3/uL — ABNORMAL HIGH (ref 4.0–10.5)
nRBC: 0 % (ref 0.0–0.2)

## 2018-09-02 LAB — SARS CORONAVIRUS 2 BY RT PCR (HOSPITAL ORDER, PERFORMED IN ~~LOC~~ HOSPITAL LAB): SARS Coronavirus 2: NEGATIVE

## 2018-09-02 LAB — CBG MONITORING, ED
Glucose-Capillary: 187 mg/dL — ABNORMAL HIGH (ref 70–99)
Glucose-Capillary: 217 mg/dL — ABNORMAL HIGH (ref 70–99)

## 2018-09-02 LAB — BASIC METABOLIC PANEL
Anion gap: 12 (ref 5–15)
BUN: 28 mg/dL — ABNORMAL HIGH (ref 8–23)
CO2: 28 mmol/L (ref 22–32)
Calcium: 9.3 mg/dL (ref 8.9–10.3)
Chloride: 92 mmol/L — ABNORMAL LOW (ref 98–111)
Creatinine, Ser: 0.96 mg/dL (ref 0.44–1.00)
GFR calc Af Amer: >60 mL/min (ref >60)
GFR calc non Af Amer: >60 mL/min (ref >60)
Glucose, Bld: 300 mg/dL — ABNORMAL HIGH (ref 70–99)
Potassium: 4.2 mmol/L (ref 3.5–5.1)
Sodium: 132 mmol/L — ABNORMAL LOW (ref 135–145)

## 2018-09-02 LAB — URINALYSIS, ROUTINE W REFLEX MICROSCOPIC
Bilirubin Urine: NEGATIVE
Glucose, UA: 150 mg/dL — AB
Ketones, ur: NEGATIVE mg/dL
Leukocytes,Ua: NEGATIVE
Nitrite: NEGATIVE
Protein, ur: 30 mg/dL — AB
Specific Gravity, Urine: >1.046 — ABNORMAL HIGH (ref 1.005–1.030)
pH: 5 (ref 5.0–8.0)

## 2018-09-02 LAB — HEMOGLOBIN A1C
Hgb A1c MFr Bld: 8.8 % — ABNORMAL HIGH (ref 4.8–5.6)
Mean Plasma Glucose: 205.86 mg/dL

## 2018-09-02 LAB — LACTIC ACID, PLASMA
Lactic Acid, Venous: 2.5 mmol/L (ref 0.5–1.9)
Lactic Acid, Venous: 1.4 mmol/L (ref 0.5–1.9)

## 2018-09-02 MED ORDER — PRAZOSIN HCL 1 MG PO CAPS: 1.0000 mg | ORAL_CAPSULE | Freq: Every day | ORAL | Status: DC

## 2018-09-02 MED ORDER — LITHIUM CARBONATE ER 300 MG PO TBCR
600.0000 mg | EXTENDED_RELEASE_TABLET | Freq: Every day | ORAL | Status: DC
  Administered 2018-09-02 – 2018-09-08 (×7): 600 mg via ORAL
  Filled 2018-09-02 (×11): qty 2

## 2018-09-02 MED ORDER — SODIUM CHLORIDE 0.9 % IV SOLN
INTRAVENOUS | Status: AC
  Administered 2018-09-02 – 2018-09-03 (×2): via INTRAVENOUS

## 2018-09-02 MED ORDER — CLINDAMYCIN PHOSPHATE 600 MG/50ML IV SOLN
600.0000 mg | Freq: Three times a day (TID) | INTRAVENOUS | Status: DC
  Administered 2018-09-02 – 2018-09-05 (×9): 600 mg via INTRAVENOUS
  Filled 2018-09-02 (×9): qty 50

## 2018-09-02 MED ORDER — HYDROCODONE-ACETAMINOPHEN 5-325 MG PO TABS
1.0000 | ORAL_TABLET | ORAL | Status: DC | PRN
  Administered 2018-09-03 (×2): 2 via ORAL
  Filled 2018-09-02 (×3): qty 2

## 2018-09-02 MED ORDER — ACETAMINOPHEN 650 MG RE SUPP: 650.0000 mg | Freq: Four times a day (QID) | RECTAL | Status: DC | PRN

## 2018-09-02 MED ORDER — SODIUM CHLORIDE 0.9 % IV BOLUS
1000.0000 mL | Freq: Once | INTRAVENOUS | Status: AC
  Administered 2018-09-02: 16:00:00 1000 mL via INTRAVENOUS

## 2018-09-02 MED ORDER — KETOROLAC TROMETHAMINE 30 MG/ML IJ SOLN
30.0000 mg | Freq: Once | INTRAMUSCULAR | Status: AC
  Administered 2018-09-02: 30 mg via INTRAVENOUS
  Filled 2018-09-02: qty 1

## 2018-09-02 MED ORDER — ONDANSETRON HCL 4 MG PO TABS: 4.0000 mg | ORAL_TABLET | Freq: Four times a day (QID) | ORAL | Status: DC | PRN

## 2018-09-02 MED ORDER — DIPHENHYDRAMINE HCL 50 MG/ML IJ SOLN: 12.5000 mg | Freq: Four times a day (QID) | INTRAMUSCULAR | Status: DC | PRN

## 2018-09-02 MED ORDER — PANTOPRAZOLE SODIUM 40 MG PO TBEC
40.0000 mg | DELAYED_RELEASE_TABLET | Freq: Every day | ORAL | Status: DC
  Administered 2018-09-03 – 2018-09-09 (×7): 40 mg via ORAL
  Filled 2018-09-02 (×7): qty 1

## 2018-09-02 MED ORDER — SODIUM CHLORIDE 0.9 % IV SOLN: 2.0000 g | Freq: Three times a day (TID) | INTRAVENOUS | Status: DC

## 2018-09-02 MED ORDER — FENTANYL CITRATE (PF) 100 MCG/2ML IJ SOLN
50.0000 ug | Freq: Once | INTRAMUSCULAR | Status: AC
  Administered 2018-09-02: 50 ug via INTRAVENOUS
  Filled 2018-09-02: qty 2

## 2018-09-02 MED ORDER — INSULIN ASPART 100 UNIT/ML ~~LOC~~ SOLN
0.0000 [IU] | SUBCUTANEOUS | Status: DC
  Administered 2018-09-02 (×2): 3 [IU] via SUBCUTANEOUS
  Administered 2018-09-03: 8 [IU] via SUBCUTANEOUS
  Administered 2018-09-03: 2 [IU] via SUBCUTANEOUS
  Administered 2018-09-03: 12:00:00 3 [IU] via SUBCUTANEOUS
  Administered 2018-09-04: 5 [IU] via SUBCUTANEOUS
  Administered 2018-09-04: 8 [IU] via SUBCUTANEOUS
  Administered 2018-09-04 – 2018-09-05 (×6): 3 [IU] via SUBCUTANEOUS
  Administered 2018-09-05: 2 [IU] via SUBCUTANEOUS
  Administered 2018-09-05 – 2018-09-06 (×3): 5 [IU] via SUBCUTANEOUS
  Administered 2018-09-06: 3 [IU] via SUBCUTANEOUS
  Administered 2018-09-06: 01:00:00 2 [IU] via SUBCUTANEOUS
  Administered 2018-09-07 (×2): 3 [IU] via SUBCUTANEOUS
  Administered 2018-09-07 (×2): 5 [IU] via SUBCUTANEOUS
  Administered 2018-09-07 (×2): 2 [IU] via SUBCUTANEOUS
  Filled 2018-09-02 (×2): qty 1

## 2018-09-02 MED ORDER — ALBUTEROL SULFATE (2.5 MG/3ML) 0.083% IN NEBU: 2.5000 mg | INHALATION_SOLUTION | RESPIRATORY_TRACT | Status: DC | PRN

## 2018-09-02 MED ORDER — METHYLPHENIDATE HCL 5 MG PO TABS
20.0000 mg | ORAL_TABLET | Freq: Three times a day (TID) | ORAL | Status: DC
  Administered 2018-09-03 – 2018-09-09 (×17): 20 mg via ORAL
  Filled 2018-09-02 (×18): qty 4

## 2018-09-02 MED ORDER — LEVOFLOXACIN IN D5W 750 MG/150ML IV SOLN
750.0000 mg | INTRAVENOUS | Status: DC
  Administered 2018-09-02 – 2018-09-04 (×2): 750 mg via INTRAVENOUS
  Filled 2018-09-02 (×3): qty 150

## 2018-09-02 MED ORDER — CLINDAMYCIN PHOSPHATE 900 MG/50ML IV SOLN
900.0000 mg | Freq: Once | INTRAVENOUS | Status: AC
  Administered 2018-09-02: 900 mg via INTRAVENOUS
  Filled 2018-09-02: qty 50

## 2018-09-02 MED ORDER — IOHEXOL 300 MG/ML  SOLN
75.0000 mL | Freq: Once | INTRAMUSCULAR | Status: AC | PRN
  Administered 2018-09-02: 15:00:00 75 mL via INTRAVENOUS

## 2018-09-02 MED ORDER — ONDANSETRON HCL 4 MG/2ML IJ SOLN: 4.0000 mg | Freq: Four times a day (QID) | INTRAMUSCULAR | Status: DC | PRN

## 2018-09-02 MED ORDER — ROSUVASTATIN CALCIUM 20 MG PO TABS
20.0000 mg | ORAL_TABLET | Freq: Every day | ORAL | Status: DC
  Administered 2018-09-03 – 2018-09-09 (×7): 20 mg via ORAL
  Filled 2018-09-02 (×7): qty 1

## 2018-09-02 MED ORDER — MORPHINE SULFATE (PF) 2 MG/ML IV SOLN: 1.0000 mg | INTRAVENOUS | Status: DC | PRN

## 2018-09-02 MED ORDER — SODIUM CHLORIDE 0.9 % IV SOLN
2.0000 g | Freq: Once | INTRAVENOUS | Status: AC
  Administered 2018-09-02: 2 g via INTRAVENOUS
  Filled 2018-09-02: qty 2

## 2018-09-02 MED ORDER — ACETAMINOPHEN 325 MG PO TABS
650.0000 mg | ORAL_TABLET | Freq: Four times a day (QID) | ORAL | Status: DC | PRN
  Administered 2018-09-03 – 2018-09-08 (×7): 650 mg via ORAL
  Filled 2018-09-02 (×8): qty 2

## 2018-09-02 MED ORDER — ALPRAZOLAM 0.5 MG PO TABS
1.0000 mg | ORAL_TABLET | Freq: Three times a day (TID) | ORAL | Status: DC | PRN
  Administered 2018-09-03: 2 mg via ORAL
  Administered 2018-09-04 (×2): 1 mg via ORAL
  Administered 2018-09-05 – 2018-09-09 (×10): 2 mg via ORAL
  Filled 2018-09-02 (×3): qty 4
  Filled 2018-09-02: qty 2
  Filled 2018-09-02 (×2): qty 4
  Filled 2018-09-02: qty 2
  Filled 2018-09-02 (×6): qty 4

## 2018-09-02 MED ORDER — DIPHENHYDRAMINE HCL 25 MG PO CAPS
25.0000 mg | ORAL_CAPSULE | Freq: Four times a day (QID) | ORAL | Status: DC | PRN
  Administered 2018-09-04 (×2): 25 mg via ORAL
  Filled 2018-09-02 (×2): qty 1

## 2018-09-02 MED ORDER — VENLAFAXINE HCL ER 75 MG PO CP24
150.0000 mg | ORAL_CAPSULE | Freq: Every day | ORAL | Status: DC
  Administered 2018-09-03 – 2018-09-09 (×7): 150 mg via ORAL
  Filled 2018-09-02: qty 2
  Filled 2018-09-02: qty 4
  Filled 2018-09-02 (×5): qty 2
  Filled 2018-09-02: qty 4

## 2018-09-02 MED ORDER — ROSUVASTATIN CALCIUM 20 MG PO TABS
20.0000 mg | ORAL_TABLET | Freq: Every day | ORAL | Status: DC
  Filled 2018-09-02: qty 1

## 2018-09-02 NOTE — Progress Notes (Signed)
Pharmacy Antibiotic Note  Adrienne [name] is a 63 y.o. female admitted on 09/02/2018 with unknown source.  Pharmacy has been consulted for Aztreonam dosing.  Plan: Aztreonam 2gm IV q8h F/U cultures and clinical progress Monitor V/S, labs  Height: 5\' 3"  (160 cm) Weight: 167 lb (75.8 kg) IBW/kg (Calculated) : 52.4  Temp (24hrs), Avg:100.9 F (38.3 C), Min:100.9 F (38.3 C), Max:100.9 F (38.3 C)  Recent Labs  Lab 09/02/18 1214 09/02/18 1356  WBC  --  24.2*  CREATININE  --  0.96  LATICACIDVEN 2.5* 1.4    Estimated Creatinine Clearance: 57.8 mL/min (by C-G formula based on SCr of 0.96 mg/dL).    Allergies  Allergen Reactions  . Codeine Other (See Comments)    REACTION: headaches  . Dilaudid [Hydromorphone Hcl] Itching  . Morphine And Related Itching and Other (See Comments)    Side effect  . Penicillins Rash and Other (See Comments)    Has patient had a PCN reaction causing immediate rash, facial/tongue/throat swelling, SOB or lightheadedness with hypotension: Yes Has patient had a PCN reaction causing severe rash involving mucus membranes or skin necrosis: Yes Has patient had a PCN reaction that required hospitalization No Has patient had a PCN reaction occurring within the last 10 years: No If all of the above answers are "NO", then may proceed with Cephalosporin use.   . Sulfonamide Derivatives Rash    Antimicrobials this admission: Aztreonam 6/26 >>  Clindamycin 900mg  IV x 1  Dose adjustments this admission: N/A  Microbiology results: 6/26 BCx: pending  MRSA PCR:   Thank you for allowing pharmacy to be a part of this patient's care.  Isac Sarna, BS [place], [place] Clinical Pharmacist Pager [phone] 09/02/2018 3:31 PM

## 2018-09-02 NOTE — H&P (Signed)
History and Physical    Adrienne [name] WYO:[id number] DOB: [birth year] DOA: 09/02/2018  PCP: Asencion Noble, MD  Patient coming from: Home  I have personally briefly reviewed patient's old medical records in [place]  Chief Complaint: Right jaw swelling and pain  HPI: Adrienne [name] is a 63 y.o. female with medical history significant for COPD, type 2 diabetes, hypertension, hyperlipidemia, bipolar disorder, anxiety and depression who presents to the ED for evaluation of right jaw pain and swelling.  Patient reportedly had 2 right lower teeth extracted on 622 by her dentist Dr. Delcie Roch.  She was prescribed oral clindamycin which she was taking.  She noticed no improvement and actually had worsening swelling at her right jaw with increased pain and making it difficult to fully open her mouth.  Her dentist referred her to oral surgery in [place], however she was unable to be seen before 09/05/2018, therefore she presented to [name], ED for further evaluation.  She reports difficulty with chewing solid foods but states that she is able to tolerate liquids well when using a straw.  She denies any chest pain, dyspnea, abdominal pain, dysuria, subjective fevers, chills, diaphoresis.  ED Course:  Initial vitals showed BP 106/94, pulse 103, RR 25, temp 100.9 Fahrenheit, SPO2 99% on room air.  Labs are notable for WBC 24.2, hemoglobin 13.7, platelets 364,000, BUN 28, creatinine 0.96, serum glucose 300, lactic acid 2.5.  Urinalysis was negative for UTI.  Blood cultures were drawn and pending.  SARS-CoV-2 test was negative.  CT soft tissue neck with contrast was obtained and showed: IMPRESSION: 1. Extensive odontogenic phlegmon and serpiginous abscess occupying the right lower masticator, right submandibular, and lateral right sublingual spaces. Subperiosteal abscess in and around the posterior most right mandible tooth extraction site. Probable focal mandible osteomyelitis at the roots of  that extraction site. Complete involvement of the right submandibular gland, which appears infiltrated by abscess. Regional cellulitis and reactive lymphadenopathy. Associated leftward mass effect on the pharynx and supraglottic larynx.  2. The central sublingual space is spared. There is a small retropharyngeal effusion, but no other deep soft tissue space involvement in neck or upper chest.  Patient was given 1 L normal saline with improvement in lactic acid to 1.4.  Patient was also given IV clindamycin and aztreonam.  She received IV fentanyl and Toradol for pain.  EDP paged ENT awaiting callback.  Hospitalist service was consulted to admit.   Review of Systems: All systems reviewed and are negative except as documented in history of present illness above.   Past Medical History:  Diagnosis Date   Allergic rhinitis    Allergy    Anemia    leukocytosis/thrombocytosis--Dr. Tressie Stalker   Anxiety    Arthritis    Asthma    Brain injury ([place])    Closed head   Closed head injury 7741   Complication of anesthesia    "my blood pressure dropped"   COPD (chronic obstructive pulmonary disease) ([place])    Depression    ? Bipolar Disorder--Dr, Jimmye Norman   Emphysema of lung ([place])    GERD (gastroesophageal reflux disease)    Barrett's esophagus   Headache    migraines   Hx of migraines    Hypertension    Low back pain    Macular degeneration, bilateral    Dry-Dr. Iona Hansen   Neuromuscular disorder (HCC)    NIDDM (non-insulin dependent diabetes mellitus)    OSA (obstructive sleep apnea)    cannot tolerate mask  RLS (restless legs syndrome)    Seizure disorder ([place])    ? absence; due to head truama   Seizures ([place])    Sleep apnea    Status post right hip replacement    Status post rotator cuff surgery     Past Surgical History:  Procedure Laterality Date   ABDOMINAL HYSTERECTOMY     heavy bleeding   APPENDECTOMY     BIOPSY  12/27/2014    Procedure: Esophageal BIOPSY;  Surgeon: Daneil Dolin, MD;  Location: [facility];  Service: Endoscopy;;   BIOPSY  02/17/2018   Procedure: BIOPSY;  Surgeon: Daneil Dolin, MD;  Location: [facility];  Service: Endoscopy;;  (gastric)   COLONOSCOPY WITH ESOPHAGOGASTRODUODENOSCOPY (EGD)  12/08   TCS/EGD: + Barrett's,+ gastritis, friable anal canal, hyperplastic polyp   COLONOSCOPY WITH PROPOFOL N/A 12/27/2014   RMR: Internal hemorrhoids likely source of hematochezia. Redundant colon.    ESOPHAGOGASTRODUODENOSCOPY  11/16/2002   Dr.Rourk- salmon-colored epithelium in the upper esophageal sphincter previous bx= ectopic gastric mucosa, salmon colored epithelium at the distal esophagus proviously bx= barretts esophagus o/w the esophageal mucosa appeared normal. patulous esophagogastric junction, small hiatal hernia, gastric mucosa o/w appeared normal. normal D1 and D2   ESOPHAGOGASTRODUODENOSCOPY (EGD) WITH PROPOFOL N/A 12/27/2014   RMR: Abnoraml distal esophagus consistiant with prior diagnosis fo Barretts esophagus- Status post biopsy. Hiatal hernia. Antral erosions status post gastric biopsy,   ESOPHAGOGASTRODUODENOSCOPY (EGD) WITH PROPOFOL N/A 02/17/2018   Procedure: ESOPHAGOGASTRODUODENOSCOPY (EGD) WITH PROPOFOL;  Surgeon: Daneil Dolin, MD;  [facility];  Service: Endoscopy;  Laterality: N/A;  1:30pm   FRACTURE SURGERY Left 03/07/2013   knee   fx left knee Left 2014   ORIF TIBIA PLATEAU Left 03/07/2013   Procedure: OPEN REDUCTION INTERNAL FIXATION (ORIF) TIBIAL PLATEAU;  Surgeon: Renette Butters, MD;  Location: [facility];  Service: Orthopedics;  Laterality: Left;   REVISION TOTAL HIP ARTHROPLASTY     Right   ROTATOR CUFF REPAIR     Bilaterally   SHOULDER INJECTION Left 03/07/2013   Procedure: SHOULDER INJECTION ; Depomedrol + 0.25% Marcaine mixture 4:1;  Surgeon: Renette Butters, MD;  Location: [facility];  Service: Orthopedics;  Laterality: Left;   TONSILLECTOMY     TOTAL  HIP ARTHROPLASTY Left 06/01/2016   Procedure: LEFT TOTAL HIP ARTHROPLASTY ANTERIOR APPROACH;  Surgeon: Gaynelle Arabian, MD;  Location: [facility];  Service: Orthopedics;  Laterality: Left;   VESICOVAGINAL FISTULA CLOSURE W/ TAH      Social History:  reports that she has been smoking cigarettes. She started smoking about 47 years ago. She has a 20.00 pack-year smoking history. She has never used smokeless tobacco. She reports that she does not drink alcohol or use drugs.  Allergies  Allergen Reactions   Codeine Other (See Comments)    REACTION: headaches   Dilaudid [Hydromorphone Hcl] Itching   Morphine And Related Itching and Other (See Comments)    Side effect   Penicillins Rash and Other (See Comments)    Has patient had a PCN reaction causing immediate rash, facial/tongue/throat swelling, SOB or lightheadedness with hypotension: Yes Has patient had a PCN reaction causing severe rash involving mucus membranes or skin necrosis: Yes Has patient had a PCN reaction that required hospitalization No Has patient had a PCN reaction occurring within the last 10 years: No If all of the above answers are "NO", then may proceed with Cephalosporin use.    Sulfonamide Derivatives Rash    Family History  Problem  Relation Age of Onset   Heart disease Father 70   Atrial fibrillation Mother    Cancer Mother        carcinoid   Diabetes Maternal Grandfather    Cancer Maternal Grandfather    Heart disease Maternal Grandmother    Thyroid disease Son      Prior to Admission medications   Medication Sig Start Date End Date Taking? Authorizing Provider  ALPRAZolam Duanne Moron) 1 MG tablet Take 1 tablet (1 mg total) by mouth at bedtime as needed. Patient taking differently: Take 1-2 mg by mouth See admin instructions. Take 1 mg by mouth in the morning, take 1 mg by mouth at 1200, take 1 mg by mouth at 1600 and take 2 mg by mouth at bedtime 03/13/13   Reed, Tiffany L, DO  aspirin 81 MG tablet Take 81  mg by mouth daily.    [provider]  B Complex-C (SUPER B COMPLEX PO) Take 1 tablet by mouth daily.    [provider]  Biotin 10 MG CAPS Take 10 mg by mouth daily.     [provider]  Cholecalciferol (D3-1000) 25 MCG (1000 UT) capsule Take 1,000 Units by mouth daily.     [provider]  CINNAMON PO Take 1,000 mg by mouth 2 (two) times daily.     [provider]  clindamycin (CLEOCIN) 150 MG capsule Take 2 capsules (300 mg total) by mouth 3 (three) times daily. May dispense as 150mg  capsules 02/04/18   Noemi Chapel, MD  clindamycin (CLEOCIN) 300 MG capsule Take 1 capsule by mouth 2 (two) times a day. 08/29/18   [provider]  cyclobenzaprine (FLEXERIL) 10 MG tablet Take 30 mg by mouth at bedtime.     [provider]  Flaxseed, Linseed, (FLAX SEED OIL PO) Take 1,200 mg by mouth daily.    [provider]  glimepiride (AMARYL) 1 MG tablet Take 1 tablet by mouth daily. 08/15/18   [provider]  HYDROcodone-acetaminophen (NORCO/VICODIN) 5-325 MG tablet Take 1 tablet by mouth every 4 (four) hours as needed. 09/01/18   [provider]  hydrocortisone (ANUSOL-HC) 2.5 % rectal cream Place 1 application rectally 2 (two) times daily. Patient taking differently: Place 1 application rectally 2 (two) times daily as needed for hemorrhoids or anal itching.  07/08/17   Annitta Needs, NP  ibuprofen (ADVIL) 800 MG tablet Take 1 tablet by mouth 4 (four) times daily as needed. 08/24/18   [provider]  ketoconazole (NIZORAL) 2 % cream Apply 1 application topically 2 (two) times daily.    [provider]  lithium carbonate (LITHOBID) 300 MG CR tablet Take 600 mg by mouth at bedtime.  07/05/17   [provider]  metFORMIN (GLUCOPHAGE) 1000 MG tablet Take 1 tablet by mouth 2 (two) times a day. 06/09/18   [provider]  methylphenidate (RITALIN) 20 MG tablet Take 20 mg by mouth 3 (three) times  daily with meals.     [provider]  Multiple Vitamins-Minerals (OCUVITE ADULT 50+ PO) Take 1 capsule by mouth daily.    [provider]  naproxen sodium (ALEVE) 220 MG tablet Take 440 mg by mouth 2 (two) times daily as needed (for pain or headache).     [provider]  NONFORMULARY OR COMPOUNDED ITEM Apply 1 application topically at bedtime as needed (for toe nails). Terbinafine1000MG /IBU2%/DMSO30ML    [provider]  Omega-3 Fatty Acids (FISH OIL) 1000 MG CAPS Take 1,000 mg  by mouth daily.    [provider]  omeprazole (PRILOSEC) 20 MG capsule Take 1 capsule (20 mg total) by mouth 2 (two) times daily before a meal. 04/28/18   Carlis Stable, NP  ondansetron (ZOFRAN ODT) 4 MG disintegrating tablet Take 1 tablet (4 mg total) by mouth every 8 (eight) hours as needed for nausea. 02/04/18   Noemi Chapel, MD  prazosin (MINIPRESS) 1 MG capsule Take 1 mg by mouth at bedtime.    [provider]  rosuvastatin (CRESTOR) 20 MG tablet TAKE (1) TABLET BY MOUTH ONCE DAILY. Patient taking differently: Take 20 mg by mouth daily.  02/11/17   Raylene Everts, MD  Salicylic Acid 50 % SOLN Apply 1 application topically at bedtime.    [provider]  TERBINAFINE EX Apply topically.    [provider]  venlafaxine XR (EFFEXOR-XR) 150 MG 24 hr capsule Take 150 mg by mouth daily with breakfast.    [provider]  vitamin E 400 UNIT capsule Take 400 Units by mouth daily.    [provider]    Physical Exam: Vitals:   09/02/18 1400 09/02/18 1415 09/02/18 1430 09/02/18 1445  BP: 100/67  112/70   Pulse: 80 77 78 77  Resp: 15 (!) 25 (!) 24 (!) 22  Temp:      TempSrc:      SpO2: 90% (!) 84% (!) 85% 94%  Weight:      Height:        Constitutional: Resting supine in bed, NAD, calm, comfortable Eyes: PERRL, lids and conjunctivae normal ENMT: Swelling of the right jaw with slight erythema and tenderness to palpation.   Visualization of the oropharynx is limited as she is only able to open her mouth about 1 to 1.5 inches.  Visualized mucous membranes are moist.  Unable to visualize posterior oropharynx or right lower teeth. Neck: Neck is swollen laterally to the right and anteriorly, tender to palpation. Respiratory: Bilateral wheezing present. Normal respiratory effort. No accessory muscle use.  Currently protecting airway. Cardiovascular: Regular rate and rhythm, no murmurs / rubs / gallops. No extremity edema. 2+ pedal pulses. Abdomen: no tenderness, no masses palpated. No hepatosplenomegaly. Bowel sounds positive.  Musculoskeletal: no clubbing / cyanosis. No joint deformity upper and lower extremities. Good ROM, no contractures. Normal muscle tone.  Skin: no rashes, lesions, ulcers. No induration Neurologic: Sensation intact, Strength 5/5 in all 4.  Psychiatric: Normal judgment and insight. Alert and oriented x 3. Normal mood.   Labs on Admission: I have personally reviewed following labs and imaging studies  CBC: Recent Labs  Lab 09/02/18 1356  WBC 24.2*  NEUTROABS 20.1*  HGB 13.7  HCT 42.3  MCV 92.0  PLT 614   Basic Metabolic Panel: Recent Labs  Lab 09/02/18 1356  NA 132*  K 4.2  CL 92*  CO2 28  GLUCOSE 300*  BUN 28*  CREATININE 0.96  CALCIUM 9.3   GFR: Estimated Creatinine Clearance: 57.8 mL/min (by C-G formula based on SCr of 0.96 mg/dL). Liver Function Tests: No results for input(s): AST, ALT, ALKPHOS, BILITOT, PROT, ALBUMIN in the last 168 hours. No results for input(s): LIPASE, AMYLASE in the last 168 hours. No results for input(s): AMMONIA in the last 168 hours. Coagulation Profile: No results for input(s): INR, PROTIME in the last 168 hours. Cardiac Enzymes: No results for input(s): CKTOTAL, CKMB, CKMBINDEX, TROPONINI in the last 168 hours. BNP (last 3 results) No results for input(s): PROBNP in the last 8760  hours. HbA1C: No results for input(s): HGBA1C in the last 72  hours. CBG: No results for input(s): GLUCAP in the last 168 hours. Lipid Profile: No results for input(s): CHOL, HDL, LDLCALC, TRIG, CHOLHDL, LDLDIRECT in the last 72 hours. Thyroid Function Tests: No results for input(s): TSH, T4TOTAL, FREET4, T3FREE, THYROIDAB in the last 72 hours. Anemia Panel: No results for input(s): VITAMINB12, FOLATE, FERRITIN, TIBC, IRON, RETICCTPCT in the last 72 hours. Urine analysis:    Component Value Date/Time   COLORURINE YELLOW 09/02/2018 1151   APPEARANCEUR HAZY (A) 09/02/2018 1151   LABSPEC >1.046 (H) 09/02/2018 1151   PHURINE 5.0 09/02/2018 1151   GLUCOSEU 150 (A) 09/02/2018 1151   HGBUR SMALL (A) 09/02/2018 1151   BILIRUBINUR NEGATIVE 09/02/2018 1151   KETONESUR NEGATIVE 09/02/2018 1151   PROTEINUR 30 (A) 09/02/2018 1151   UROBILINOGEN 0.2 12/16/2007 1404   NITRITE NEGATIVE 09/02/2018 1151   LEUKOCYTESUR NEGATIVE 09/02/2018 1151    Radiological Exams on Admission: Ct Soft Tissue Neck W Contrast  Addendum Date: 09/02/2018   ADDENDUM REPORT: 09/02/2018 15:46 ADDENDUM: Study discussed by telephone with Dr. Noemi Chapel on 09/02/2018 at 1521 hours. Electronically Signed   By: Genevie Ann M.D.   On: 09/02/2018 15:46   Result Date: 09/02/2018 CLINICAL DATA:  62 year old female status post dental extractions four days ago. Jaw infection not improving. EXAM: CT NECK WITH CONTRAST TECHNIQUE: Multidetector CT imaging of the neck was performed using the standard protocol following the bolus administration of intravenous contrast. CONTRAST:  40mL OMNIPAQUE IOHEXOL 300 MG/ML  SOLN COMPARISON:  Report of brain MRI 10/13/2006 (no images available). FINDINGS: Pharynx and larynx: Leftward mass effect on the pharynx and supraglottic larynx related to the abnormality of the right masticator and submandibular spaces. Negative glottis. Negative superior parapharyngeal spaces. There is a mild retropharyngeal effusion, along with partially retropharyngeal course of both  carotids (more so the left). Salivary glands: The central sublingual space remains normal on series 2, image 44. But there is bulky phlegmon throughout the lateral right sublingual space, right submandibular space, and medial right masticator space. The abnormality extends from the hyoid bone cephalad toward the pterygoid plates, and includes serpiginous subperiosteal abscess along both superior and inferior aspect of the posterior body of the mandible (coronal series 4, image 38) and serpiginous fluid tracking inferiorly in the submandibular space toward the midline of the submental region (same image). The abnormality is inseparable from the right submandibular gland which appears highly abnormal, and seems to have abscess fluid tracking within its parenchyma (sagittal image 56). The poorly marginated and somewhat discontinuous appearing abscess fluid encompasses an area of nearly 33 x 28 x 50 millimeters (AP by transverse by CC). Regional cellulitis. Superimposed dental extractions, further detailed below. The left submandibular gland remains within normal limits. The left parotid gland is normal. The right parotid gland is normal except at its inferior margin where it may be mildly secondarily inflamed. Thyroid: Negative aside from mild overlying strap muscle edema. Lymph nodes: Reactive right level 1 lymph nodes up to 7 millimeter short axis. Reactive right level 2A lymph nodes up to 10 millimeters. No cystic or necrotic nodes. Vascular: The major vascular structures in the neck and at the skull base are patent including the right IJ. There is a retropharyngeal course of the carotids, more so the left. The left vertebral artery appears dominant. Limited intracranial: Negative. Visualized orbits: Negative. Mastoids and visualized paranasal sinuses: Clear. Skeleton: Right mandible molar and wisdom tooth extraction sites with surrounding subperiosteal  abscess. There appears to be abscess within the posterior-most  extraction site (coronal image 38 and series 2, image 33), and associated abnormal mandible mineralization about the roots of that extraction cavity (series 3, image 40). Left TMJ degeneration. Cervical spine degeneration. No other No acute osseous abnormality identified. Upper chest: Negative visible mediastinum aside from Calcified aortic atherosclerosis. Centrilobular emphysema and lung scarring greater on the left. IMPRESSION: 1. Extensive odontogenic phlegmon and serpiginous abscess occupying the right lower masticator, right submandibular, and lateral right sublingual spaces. Subperiosteal abscess in and around the posterior most right mandible tooth extraction site. 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 Complete involvement of the right submandibular gland, which appears infiltrated by abscess. Regional cellulitis and reactive lymphadenopathy. Associated leftward mass effect on the pharynx and supraglottic larynx. 2. The central sublingual space is spared. There is a small retropharyngeal effusion, but no other deep soft tissue space involvement in neck or upper chest. Electronically Signed: By: Genevie Ann M.D. On: 09/02/2018 15:15    EKG: Independently reviewed. Sinus rhythm, early R wave transition, no significant change from prior.  Assessment/Plan Principal Problem:   Mandibular abscess Active Problems:   Type 2 diabetes mellitus without complications (HCC)   Anxiety and depression   Bipolar 1 disorder (HCC)   Hypertension   Hyperlipidemia associated with type 2 diabetes mellitus (HCC)  Adrienne [name] is a 63 y.o. female with medical history significant for COPD, type 2 diabetes, hypertension, hyperlipidemia, bipolar disorder, anxiety and depression who is admitted for extensive right jaw cellulitis with abscesses and probable osteomyelitis.   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  I spoke with ENT, Dr. Redmond Baseman, who recommended neurosurgery consultation.  I spoke with oral surgery, Dr. Hoyt Koch, who recommended transfer to [facility] and will see tomorrow. -Transfer to PCU at [facility] oral surgery evaluation -We will start IV clindamycin 600 mg q8h and IV levofloxacin 750 mg daily -Currently protecting airway, monitor closely for changes -Continue IV fluid resuscitation overnight -Continue pain control  Hyperglycemia and type 2 diabetes: On metformin and glimepiride as an outpatient.  Hyperglycemia worsening due to infection.  Holding home oral meds while in hospital. -Start moderate SSI q4h while n.p.o.  Hypertension: Borderline soft blood pressure.  Holding home prazosin.  Continue IV fluid resuscitation overnight.  Hyperlipidemia: Continue rosuvastatin.  Bipolar 1 disorder/anxiety/depression: On numerous medications prescribed by her psychiatrist.  Mood disorders currently stable. -Continue home lithium -Continue home Xanax -Continue home venlafaxine XR -Continue home methylphenidate  DVT prophylaxis: SCDs Code Status: Full code, confirmed patient Family Communication: None present on admission Disposition Plan: Admit to [facility] called: Oral surgery, Dr. Arnoldo Morale Admission status: Inpatient for management of extensive right chest cellulitis/abscesses/osteomyelitis and likely oral surgery intervention.   Zada Finders MD [facility]  If 7PM-7AM, please contact night-coverage [email or web address]  09/02/2018, 4:44 PM

## 2018-09-02 NOTE — ED Triage Notes (Signed)
Patient had oral surgery on Monday to remove 2 teeth. She has developed infection in her jaw, was being treated by the oral surgeon. Infection is no better so oral surgeon referred her to the ED.

## 2018-09-02 NOTE — ED Notes (Signed)
Pt was informed that we need a urine sample. Pt states that she can not urinate at this time. 

## 2018-09-02 NOTE — ED Notes (Signed)
Date and time results received: 09/02/18 1248 (use smartphrase ".now" to insert current time)  Test: lactic acid Critical Value: 2.5  Name of Provider Notified: Dr. Sabra Heck  Orders Received? Or Actions Taken?: n/a

## 2018-09-02 NOTE — Sepsis Progress Note (Signed)
Notified provider of need to order broad spectrum antibiotics.

## 2018-09-02 NOTE — ED Provider Notes (Signed)
[facility] EMERGENCY DEPARTMENT Provider Note   CSN: [id number] Arrival date & time: 09/02/18  1125     History   Chief Complaint Chief Complaint  Patient presents with   Post-op Problem    HPI Adrienne [name] is a 63 y.o. female.     HPI  The patient is a 63 year old female, she has a known history of emphysema as well as diabetes, she reports that she has had recent removal of 2 lower teeth on the right side, over the week she has had pain and has had some progressive swelling despite the use of antibiotics.  Was seen at the dental office today and referred to the emergency department because of aggressive and progressive swelling of the right jaw and submandibular area.  She is febrile and has trismus.  Symptoms are persistent severe and worsening.  She has a constant headache and has been unable to get rid of it despite the use of hydrocodone and ibuprofen.  Past Medical History:  Diagnosis Date   Allergic rhinitis    Allergy    Anemia    leukocytosis/thrombocytosis--Dr. Tressie Stalker   Anxiety    Arthritis    Asthma    Brain injury ([place])    Closed head   Closed head injury 4627   Complication of anesthesia    "my blood pressure dropped"   COPD (chronic obstructive pulmonary disease) ([place])    Depression    ? Bipolar Disorder--Dr, Jimmye Norman   Emphysema of lung ([place])    GERD (gastroesophageal reflux disease)    Barrett's esophagus   Headache    migraines   Hx of migraines    Hypertension    Low back pain    Macular degeneration, bilateral    Dry-Dr. Iona Hansen   Neuromuscular disorder (HCC)    NIDDM (non-insulin dependent diabetes mellitus)    OSA (obstructive sleep apnea)    cannot tolerate mask   RLS (restless legs syndrome)    Seizure disorder ([place])    ? 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   Emphysema of lung ([place]) 05/22/2016   Atherosclerosis of aorta ([place]) 05/22/2016   GAD (generalized anxiety disorder) 05/22/2016   Mucosal abnormality of stomach    Bipolar 1 disorder ([place]) 05/15/2014   History of smoking 30 or more pack years 05/15/2014   Barrett's esophagus 04/03/2010   SHOULDER, ARTHRITIS, DEGEN./OSTEO 03/06/2009   RUPTURE ROTATOR CUFF 03/06/2009   Type 2 diabetes mellitus without complications ([place]) 03/50/0938   H N P-LUMBAR 06/29/2007   RESTLESS LEG SYNDROME 05/18/2007   SLEEP APNEA 05/18/2007   Headache(784.0) 05/18/2007   SCIATICA 03/31/2007   DEPRESSION 03/28/2007   MACULAR DEGENERATION 03/28/2007   ALLERGIC RHINITIS 03/28/2007   GERD 03/28/2007   LOW BACK PAIN 03/28/2007   SEIZURE DISORDER 03/28/2007   HEAD TRAUMA, CLOSED 03/28/2007    Past Surgical History:  Procedure Laterality Date   ABDOMINAL HYSTERECTOMY     heavy bleeding   APPENDECTOMY     BIOPSY  12/27/2014   Procedure: Esophageal BIOPSY;  Surgeon: Daneil Dolin, MD;  Location: [facility] OR[facility];  Service: Endoscopy;;  BIOPSY  02/17/2018   Procedure: BIOPSY;  Surgeon: Daneil Dolin, MD;  Location: [facility] SU[facility];  Service: Endoscopy;;  (gastric)   COLONOSCOPY WITH ESOPHAGOGASTRODUODENOSCOPY (EGD)  12/08   TCS/EGD: + Barrett's,+ gastritis, friable anal canal, hyperplastic polyp   COLONOSCOPY WITH PROPOFOL N/A 12/27/2014   RMR: Internal hemorrhoids likely source of hematochezia. Redundant colon.    ESOPHAGOGASTRODUODENOSCOPY  11/16/2002   Dr.Rourk- salmon-colored epithelium in the upper esophageal sphincter previous bx= ectopic gastric mucosa, salmon colored epithelium at the distal esophagus proviously bx= barretts esophagus o/w the esophageal mucosa appeared normal. patulous  esophagogastric junction, small hiatal hernia, gastric mucosa o/w appeared normal. normal D1 and D2   ESOPHAGOGASTRODUODENOSCOPY (EGD) WITH PROPOFOL N/A 12/27/2014   RMR: Abnoraml distal esophagus consistiant with prior diagnosis fo Barretts esophagus- Status post biopsy. Hiatal hernia. Antral erosions status post gastric biopsy,   ESOPHAGOGASTRODUODENOSCOPY (EGD) WITH PROPOFOL N/A 02/17/2018   Procedure: ESOPHAGOGASTRODUODENOSCOPY (EGD) WITH PROPOFOL;  Surgeon: Daneil Dolin, MD;  Location[facility];  Service: Endoscopy;  Laterality: N/A;  1:30pm   FRACTURE SURGERY Left 03/07/2013   knee   fx left knee Left 2014   ORIF TIBIA PLATEAU Left 03/07/2013   Procedure: OPEN REDUCTION INTERNAL FIXATION (ORIF) TIBIAL PLATEAU;  Surgeon: Renette Butters, MD;  Location: [place];  Service: Orthopedics;  Laterality: Left;   REVISION TOTAL HIP ARTHROPLASTY     Right   ROTATOR CUFF REPAIR     Bilaterally   SHOULDER INJECTION Left 03/07/2013   Procedure: SHOULDER INJECTION ; Depomedrol + 0.25% Marcaine mixture 4:1;  Surgeon: Renette Butters, MD;  Location: [place];  Service: Orthopedics;  Laterality: Left;   TONSILLECTOMY     TOTAL HIP ARTHROPLASTY Left 06/01/2016   Procedure: LEFT TOTAL HIP ARTHROPLASTY ANTERIOR APPROACH;  Surgeon: Gaynelle Arabian, MD;  Location: [facility];  Service: Orthopedics;  Laterality: Left;   VESICOVAGINAL FISTULA CLOSURE W/ TAH       OB History    Gravida  3   Para  3   Term  3   Preterm      AB      Living        SAB      TAB      Ectopic      Multiple      Live Births               Home Medications    Prior to Admission medications   Medication Sig Start Date End Date Taking? Authorizing Provider  ALPRAZolam Duanne Moron) 1 MG tablet Take 1 tablet (1 mg total) by mouth at bedtime as needed. Patient taking differently: Take 1-2 mg by mouth See admin instructions. Take 1 mg by mouth in the morning, take 1 mg by mouth at 1200, take 1 mg by mouth  at 1600 and take 2 mg by mouth at bedtime 03/13/13   Reed, Tiffany L, DO  aspirin 81 MG tablet Take 81 mg by mouth daily.    [provider]  B Complex-C (SUPER B COMPLEX PO) Take 1 tablet by mouth daily.    [provider]  Biotin 10 MG CAPS Take 10 mg by mouth daily.     [provider]  Cholecalciferol (D3-1000) 25 MCG (1000 UT) capsule Take 1,000 Units by mouth daily.     [provider]  CINNAMON PO Take 1,000 mg by mouth 2 (two) times daily.     [provider]  clindamycin (CLEOCIN) 150 MG capsule  Take 2 capsules (300 mg total) by mouth 3 (three) times daily. May dispense as 150mg  capsules 02/04/18   Noemi Chapel, MD  cyclobenzaprine (FLEXERIL) 10 MG tablet Take 30 mg by mouth at bedtime.     [provider]  Flaxseed, Linseed, (FLAX SEED OIL PO) Take 1,200 mg by mouth daily.    [provider]  hydrocortisone (ANUSOL-HC) 2.5 % rectal cream Place 1 application rectally 2 (two) times daily. Patient taking differently: Place 1 application rectally 2 (two) times daily as needed for hemorrhoids or anal itching.  07/08/17   Annitta Needs, NP  ketoconazole (NIZORAL) 2 % cream Apply 1 application topically 2 (two) times daily.    [provider]  lithium carbonate (LITHOBID) 300 MG CR tablet Take 600 mg by mouth at bedtime.  07/05/17   [provider]  methylphenidate (RITALIN) 20 MG tablet Take 20 mg by mouth 3 (three) times daily with meals.     [provider]  Multiple Vitamins-Minerals (OCUVITE ADULT 50+ PO) Take 1 capsule by mouth daily.    [provider]  naproxen sodium (ALEVE) 220 MG tablet Take 440 mg by mouth 2 (two) times daily as needed (for pain or headache).     [provider]  NONFORMULARY OR COMPOUNDED ITEM Apply 1 application topically at bedtime as needed (for toe nails). Terbinafine1000MG /IBU2%/DMSO30ML    [provider]  Omega-3 Fatty Acids (FISH OIL) 1000 MG CAPS  Take 1,000 mg by mouth daily.    [provider]  omeprazole (PRILOSEC) 20 MG capsule Take 1 capsule (20 mg total) by mouth 2 (two) times daily before a meal. 04/28/18   Carlis Stable, NP  ondansetron (ZOFRAN ODT) 4 MG disintegrating tablet Take 1 tablet (4 mg total) by mouth every 8 (eight) hours as needed for nausea. 02/04/18   Noemi Chapel, MD  prazosin (MINIPRESS) 1 MG capsule Take 1 mg by mouth at bedtime.    [provider]  rosuvastatin (CRESTOR) 20 MG tablet TAKE (1) TABLET BY MOUTH ONCE DAILY. Patient taking differently: Take 20 mg by mouth daily.  02/11/17   Raylene Everts, MD  Salicylic Acid 50 % SOLN Apply 1 application topically at bedtime.    [provider]  TERBINAFINE EX Apply topically.    [provider]  venlafaxine XR (EFFEXOR-XR) 150 MG 24 hr capsule Take 150 mg by mouth daily with breakfast.    [provider]  vitamin E 400 UNIT capsule Take 400 Units by mouth daily.    [provider]    Family History Family History  Problem Relation Age of Onset   Heart disease Father 1   Atrial fibrillation Mother    Cancer Mother        carcinoid   Diabetes Maternal Grandfather    Cancer Maternal Grandfather    Heart disease Maternal Grandmother    Thyroid disease Son     Social History Social History   Tobacco Use   Smoking status: Current Some Day Smoker    Packs/day: 0.50    Years: 40.00    Pack years: 20.00    Types: Cigarettes    Start date: 03/03/1971   Smokeless tobacco: Never Used  Substance Use Topics   Alcohol use: No   Drug use: No     Allergies   Codeine, Dilaudid [hydromorphone hcl], Morphine and related, Penicillins, and Sulfonamide derivatives   Review of Systems Review of Systems  All other systems reviewed and are  negative.    Physical Exam Updated Vital Signs BP (!) 106/94 (BP Location: Right Arm)    Pulse (!) 103    Temp (!) 100.9 F (38.3 C) (Oral)    Ht 1.6 m (5'  3")    Wt 75.8 kg    SpO2 99%    BMI 29.58 kg/m   Physical Exam Vitals signs and nursing note reviewed.  Constitutional:      General: She is not in acute distress.    Appearance: She is well-developed.  HENT:     Head: Normocephalic and atraumatic.     Mouth/Throat:     Pharynx: Oropharyngeal exudate and posterior oropharyngeal erythema present.     Comments: Trismus present, purulence coming from the right lower jaw Eyes:     General: No scleral icterus.       Right eye: No discharge.        Left eye: No discharge.     Conjunctiva/sclera: Conjunctivae normal.     Pupils: Pupils are equal, round, and reactive to light.  Neck:     Musculoskeletal: Normal range of motion. Neck rigidity and muscular tenderness present.     Thyroid: No thyromegaly.     Vascular: No JVD.     Comments: There is swelling in the submandibular region as well as along the right mandible.  Is redness to the skin which is starting to become woody and indurated Cardiovascular:     Rate and Rhythm: Regular rhythm. Tachycardia present.     Heart sounds: Normal heart sounds. No murmur. No friction rub. No gallop.   Pulmonary:     Effort: Pulmonary effort is normal. No respiratory distress.     Breath sounds: Normal breath sounds. No wheezing or rales.  Abdominal:     General: Bowel sounds are normal. There is no distension.     Palpations: Abdomen is soft. There is no mass.     Tenderness: There is no abdominal tenderness.  Musculoskeletal: Normal range of motion.        General: No tenderness.  Lymphadenopathy:     Cervical: Cervical adenopathy present.  Skin:    General: Skin is warm and dry.     Findings: Erythema present. No rash.  Neurological:     Mental Status: She is alert.     Coordination: Coordination normal.  Psychiatric:        Behavior: Behavior normal.      ED Treatments / Results  Labs (all labs ordered are listed, but only abnormal results are displayed) Labs Reviewed  SARS  CORONAVIRUS 2 (HOSPITAL ORDER, [facility])  CULTURE, BLOOD (ROUTINE X 2)  CULTURE, BLOOD (ROUTINE X 2)  LACTIC ACID, PLASMA  LACTIC ACID, PLASMA  URINALYSIS, ROUTINE W REFLEX MICROSCOPIC    EKG    Radiology Ct Soft Tissue Neck W Contrast  Result Date: 09/02/2018 CLINICAL DATA:  63 year old female status post dental extractions four days ago. Jaw infection not improving. EXAM: CT NECK WITH CONTRAST TECHNIQUE: Multidetector CT imaging of the neck was performed using the standard protocol following the bolus administration of intravenous contrast. CONTRAST:  48mL OMNIPAQUE IOHEXOL 300 MG/ML  SOLN COMPARISON:  Report of brain MRI 10/13/2006 (no images available). FINDINGS: Pharynx and larynx: Leftward mass effect on the pharynx and supraglottic larynx related to the abnormality of the right masticator and submandibular spaces. Negative glottis. Negative superior parapharyngeal spaces. There is a mild retropharyngeal effusion, along with partially retropharyngeal course of both carotids (more so  the left). Salivary glands: The central sublingual space remains normal on series 2, image 44. But there is bulky phlegmon throughout the lateral right sublingual space, right submandibular space, and medial right masticator space. The abnormality extends from the hyoid bone cephalad toward the pterygoid plates, and includes serpiginous subperiosteal abscess along both superior and inferior aspect of the posterior body of the mandible (coronal series 4, image 38) and serpiginous fluid tracking inferiorly in the submandibular space toward the midline of the submental region (same image). The abnormality is inseparable from the right submandibular gland which appears highly abnormal, and seems to have abscess fluid tracking within its parenchyma (sagittal image 56). The poorly marginated and somewhat discontinuous appearing abscess fluid encompasses an area of nearly 33 x 28 x 50 millimeters  (AP by transverse by CC). Regional cellulitis. Superimposed dental extractions, further detailed below. The left submandibular gland remains within normal limits. The left parotid gland is normal. The right parotid gland is normal except at its inferior margin where it may be mildly secondarily inflamed. Thyroid: Negative aside from mild overlying strap muscle edema. Lymph nodes: Reactive right level 1 lymph nodes up to 7 millimeter short axis. Reactive right level 2A lymph nodes up to 10 millimeters. No cystic or necrotic nodes. Vascular: The major vascular structures in the neck and at the skull base are patent including the right IJ. There is a retropharyngeal course of the carotids, more so the left. The left vertebral artery appears dominant. Limited intracranial: Negative. Visualized orbits: Negative. Mastoids and visualized paranasal sinuses: Clear. Skeleton: Right mandible molar and wisdom tooth extraction sites with surrounding subperiosteal abscess. There appears to be abscess within the posterior-most extraction site (coronal image 38 and series 2, image 33), and associated abnormal mandible mineralization about the roots of that extraction cavity (series 3, image 40). Left TMJ degeneration. Cervical spine degeneration. No other No acute osseous abnormality identified. Upper chest: Negative visible mediastinum aside from Calcified aortic atherosclerosis. Centrilobular emphysema and lung scarring greater on the left. IMPRESSION: 1. Extensive odontogenic phlegmon and serpiginous abscess occupying the right lower masticator, right submandibular, and lateral right sublingual spaces. Subperiosteal abscess in and around the posterior most right mandible tooth extraction site. 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 Complete involvement of the right submandibular gland, which appears infiltrated by abscess. Regional cellulitis and reactive lymphadenopathy. Associated leftward  mass effect on the pharynx and supraglottic larynx. 2. The central sublingual space is spared. There is a small retropharyngeal effusion, but no other deep soft tissue space involvement in neck or upper chest. Electronically Signed   By: Genevie Ann M.D.   On: 09/02/2018 15:15    Procedures .Critical Care Performed by: Noemi Chapel, MD Authorized by: Noemi Chapel, MD   Critical care provider statement:    Critical care time (minutes):  35   Critical care time was exclusive of:  Separately billable procedures and treating other patients and teaching time   Critical care was necessary to treat or prevent imminent or life-threatening deterioration of the following conditions:  Sepsis   Critical care was time spent personally by me on the following activities:  Blood draw for specimens, development of treatment plan with patient or surrogate, discussions with consultants, evaluation of patient's response to treatment, examination of patient, obtaining history from patient or surrogate, ordering and performing treatments and interventions, ordering and review of laboratory studies, ordering and review of radiographic studies, pulse oximetry, re-evaluation of patient's condition and review of old  charts   (including critical care time)  Medications Ordered in ED Medications  fentaNYL (SUBLIMAZE) injection 50 mcg (has no administration in time range)  ketorolac (TORADOL) 30 MG/ML injection 30 mg (has no administration in time range)  clindamycin (CLEOCIN) IVPB 900 mg (has no administration in time range)     Initial Impression / Assessment and Plan / ED Course  I have reviewed the triage vital signs and the nursing notes.  Pertinent labs & imaging results that were available during my care of the patient were reviewed by me and considered in my medical decision making (see chart for details).  Clinical Course as of Sep 01 1541  Fri Sep 02, 2018  1458 Patient has a severe elevation in her white  count of 24,000, she has a temperature of 100.9, her tachycardia is improving, her blood pressure is 100/62, renal function is preserved though she is hyperglycemic at 300.  Clindamycin has been given, CT scan has been obtained, I have personally viewed the CT scan which shows that the patient has an abscess in that submandibular area.  She will need to be admitted to the hospital will likely need ENT consultation.   [BM]  1530  I discussed the care with the radiologist, Dr. [place] Crane who has recognized that there is in fact a multiloculated fluid collection, somewhat ill-defined and not amenable to emergency department drainage.  I have paged ear nose and throat specialist on-call as well as hospitalist to coordinate admission.  Heart rate has improved, blood pressure remains over 096 systolic, the patient's airway continues to be intact after multiple evaluations with an oxygen of 94%, she is not feeling like her throat is closing off and her phonation is still normal.   [BM]  1542  Discussed care with Dr. Posey Pronto of the hospitalist service who will manage the inpatient management of this patient, awaiting ENT return   [BM]    Clinical Course User Index [BM] Noemi Chapel, MD       At this time the patient appears ill, she is febrile, tachycardic and has an obvious source of infection in the submandibular and right jaw consistent with a dental abscess and possibly Ludwig's angina.  She is able to open her mouth approximately 1-1/2 inches, her phonation is normal however she has some restricted range of motion of the neck secondary to pain.  Due to her ill-appearing nature she will need a sepsis work-up including a CT scan of the neck, IV antibiotics and admission to the hospital.  Patient agreeable  Final Clinical Impressions(s) / ED Diagnoses   Final diagnoses:  Sepsis, due to unspecified organism, unspecified whether acute organ dysfunction present [facility])  Other acute osteomyelitis, other site [facility])   Submandibular abscess      Noemi Chapel, MD 09/03/18 [phone]

## 2018-09-02 NOTE — ED Notes (Signed)
Pt placed on 02 2l [place] due to sats 88%. No distress

## 2018-09-02 NOTE — ED Notes (Signed)
Patient transported to CT 

## 2018-09-03 ENCOUNTER — Inpatient Hospital Stay (HOSPITAL_COMMUNITY): Payer: Medicare Other | Admitting: Certified Registered Nurse Anesthetist

## 2018-09-03 ENCOUNTER — Encounter (HOSPITAL_COMMUNITY): Payer: Self-pay | Admitting: Anesthesiology

## 2018-09-03 ENCOUNTER — Other Ambulatory Visit: Payer: Self-pay

## 2018-09-03 ENCOUNTER — Encounter (HOSPITAL_COMMUNITY)
Admission: EM | Disposition: A | Discharge status: Home Health | Payer: Self-pay | Source: Home / Self Care | Attending: Student

## 2018-09-03 ENCOUNTER — Inpatient Hospital Stay (HOSPITAL_COMMUNITY): Payer: Medicare Other

## 2018-09-03 DIAGNOSIS — E785 Hyperlipidemia, unspecified: Secondary | ICD-10-CM

## 2018-09-03 DIAGNOSIS — E872 Acidosis: Secondary | ICD-10-CM

## 2018-09-03 DIAGNOSIS — E1169 Type 2 diabetes mellitus with other specified complication: Secondary | ICD-10-CM

## 2018-09-03 DIAGNOSIS — D72825 Bandemia: Secondary | ICD-10-CM

## 2018-09-03 HISTORY — PX: INCISION AND DRAINAGE ABSCESS: SHX5864

## 2018-09-03 LAB — GLUCOSE, CAPILLARY
Glucose-Capillary: 111 mg/dL — ABNORMAL HIGH (ref 70–99)
Glucose-Capillary: 120 mg/dL — ABNORMAL HIGH (ref 70–99)
Glucose-Capillary: 127 mg/dL — ABNORMAL HIGH (ref 70–99)
Glucose-Capillary: 142 mg/dL — ABNORMAL HIGH (ref 70–99)
Glucose-Capillary: 148 mg/dL — ABNORMAL HIGH (ref 70–99)
Glucose-Capillary: 152 mg/dL — ABNORMAL HIGH (ref 70–99)
Glucose-Capillary: 269 mg/dL — ABNORMAL HIGH (ref 70–99)
Glucose-Capillary: 328 mg/dL — ABNORMAL HIGH (ref 70–99)

## 2018-09-03 LAB — CBC
HCT: 37.4 % (ref 36.0–46.0)
Hemoglobin: 12.2 g/dL (ref 12.0–15.0)
MCH: 30.3 pg (ref 26.0–34.0)
MCHC: 32.6 g/dL (ref 30.0–36.0)
MCV: 92.8 fL (ref 80.0–100.0)
Platelets: 336 10*3/uL (ref 150–400)
RBC: 4.03 MIL/uL (ref 3.87–5.11)
RDW: 13.3 % (ref 11.5–15.5)
WBC: 16.7 10*3/uL — ABNORMAL HIGH (ref 4.0–10.5)
nRBC: 0 % (ref 0.0–0.2)

## 2018-09-03 LAB — HIV ANTIBODY (ROUTINE TESTING W REFLEX): HIV Screen 4th Generation wRfx: NONREACTIVE

## 2018-09-03 SURGERY — INCISION AND DRAINAGE, ABSCESS
Anesthesia: General | Laterality: Right

## 2018-09-03 MED ORDER — ROCURONIUM BROMIDE 10 MG/ML (PF) SYRINGE
PREFILLED_SYRINGE | INTRAVENOUS | Status: AC
  Filled 2018-09-03: qty 10

## 2018-09-03 MED ORDER — SODIUM CHLORIDE 0.9 % IV SOLN
INTRAVENOUS | Status: DC | PRN
  Administered 2018-09-03: 16:00:00 50 ug/min via INTRAVENOUS

## 2018-09-03 MED ORDER — DEXAMETHASONE SODIUM PHOSPHATE 10 MG/ML IJ SOLN
INTRAMUSCULAR | Status: DC | PRN
  Administered 2018-09-03: 10 mg via INTRAVENOUS

## 2018-09-03 MED ORDER — MIDAZOLAM HCL 5 MG/5ML IJ SOLN
INTRAMUSCULAR | Status: DC | PRN
  Administered 2018-09-03: 1 mg via INTRAVENOUS

## 2018-09-03 MED ORDER — ONDANSETRON HCL 4 MG/2ML IJ SOLN: 4.0000 mg | Freq: Once | INTRAMUSCULAR | Status: DC | PRN

## 2018-09-03 MED ORDER — FENTANYL CITRATE (PF) 100 MCG/2ML IJ SOLN
25.0000 ug | INTRAMUSCULAR | Status: DC | PRN
  Administered 2018-09-03: 18:00:00 50 ug via INTRAVENOUS

## 2018-09-03 MED ORDER — LIDOCAINE-EPINEPHRINE 1 %-1:100000 IJ SOLN
INTRAMUSCULAR | Status: AC
  Filled 2018-09-03: qty 1

## 2018-09-03 MED ORDER — SUCCINYLCHOLINE CHLORIDE 20 MG/ML IJ SOLN
INTRAMUSCULAR | Status: DC | PRN
  Administered 2018-09-03: 80 mg via INTRAVENOUS

## 2018-09-03 MED ORDER — LIDOCAINE 2% (20 MG/ML) 5 ML SYRINGE
INTRAMUSCULAR | Status: DC | PRN
  Administered 2018-09-03: 60 mg via INTRAVENOUS

## 2018-09-03 MED ORDER — 0.9 % SODIUM CHLORIDE (POUR BTL) OPTIME
TOPICAL | Status: DC | PRN
  Administered 2018-09-03: 1000 mL

## 2018-09-03 MED ORDER — FENTANYL CITRATE (PF) 100 MCG/2ML IJ SOLN
INTRAMUSCULAR | Status: DC | PRN
  Administered 2018-09-03: 25 ug via INTRAVENOUS
  Administered 2018-09-03 (×2): 50 ug via INTRAVENOUS

## 2018-09-03 MED ORDER — FENTANYL CITRATE (PF) 100 MCG/2ML IJ SOLN
50.0000 ug | Freq: Once | INTRAMUSCULAR | Status: AC
  Administered 2018-09-03: 50 ug via INTRAVENOUS
  Filled 2018-09-03: qty 2

## 2018-09-03 MED ORDER — LIDOCAINE 2% (20 MG/ML) 5 ML SYRINGE
INTRAMUSCULAR | Status: AC
  Filled 2018-09-03: qty 5

## 2018-09-03 MED ORDER — FENTANYL CITRATE (PF) 100 MCG/2ML IJ SOLN
25.0000 ug | Freq: Once | INTRAMUSCULAR | Status: AC
  Administered 2018-09-03: 25 ug via INTRAVENOUS
  Filled 2018-09-03: qty 2

## 2018-09-03 MED ORDER — FENTANYL CITRATE (PF) 250 MCG/5ML IJ SOLN
INTRAMUSCULAR | Status: AC
  Filled 2018-09-03: qty 5

## 2018-09-03 MED ORDER — LACTATED RINGERS IV SOLN
INTRAVENOUS | Status: DC
  Administered 2018-09-03: 19:00:00 via INTRAVENOUS

## 2018-09-03 MED ORDER — SUCCINYLCHOLINE CHLORIDE 200 MG/10ML IV SOSY
PREFILLED_SYRINGE | INTRAVENOUS | Status: AC
  Filled 2018-09-03: qty 10

## 2018-09-03 MED ORDER — PROPOFOL 10 MG/ML IV BOLUS
INTRAVENOUS | Status: AC
  Filled 2018-09-03: qty 20

## 2018-09-03 MED ORDER — MIDAZOLAM HCL 2 MG/2ML IJ SOLN
INTRAMUSCULAR | Status: AC
  Filled 2018-09-03: qty 2

## 2018-09-03 MED ORDER — LIDOCAINE-EPINEPHRINE 1 %-1:100000 IJ SOLN
INTRAMUSCULAR | Status: DC | PRN
  Administered 2018-09-03: 3.5 mL

## 2018-09-03 MED ORDER — ACETAMINOPHEN 10 MG/ML IV SOLN: 1000.0000 mg | Freq: Once | INTRAVENOUS | Status: DC | PRN

## 2018-09-03 MED ORDER — LORATADINE 10 MG PO TABS
10.0000 mg | ORAL_TABLET | Freq: Every day | ORAL | Status: DC
  Administered 2018-09-03 – 2018-09-09 (×7): 10 mg via ORAL
  Filled 2018-09-03 (×7): qty 1

## 2018-09-03 MED ORDER — LACTATED RINGERS IV SOLN
INTRAVENOUS | Status: DC | PRN
  Administered 2018-09-03 (×2): via INTRAVENOUS

## 2018-09-03 MED ORDER — ONDANSETRON HCL 4 MG/2ML IJ SOLN
INTRAMUSCULAR | Status: DC | PRN
  Administered 2018-09-03: 4 mg via INTRAVENOUS

## 2018-09-03 MED ORDER — DEXAMETHASONE SODIUM PHOSPHATE 10 MG/ML IJ SOLN
INTRAMUSCULAR | Status: AC
  Filled 2018-09-03: qty 1

## 2018-09-03 MED ORDER — ONDANSETRON HCL 4 MG/2ML IJ SOLN
INTRAMUSCULAR | Status: AC
  Filled 2018-09-03: qty 2

## 2018-09-03 MED ORDER — FENTANYL CITRATE (PF) 100 MCG/2ML IJ SOLN
INTRAMUSCULAR | Status: AC
  Filled 2018-09-03: qty 2

## 2018-09-03 MED ORDER — PROPOFOL 10 MG/ML IV BOLUS
INTRAVENOUS | Status: DC | PRN
  Administered 2018-09-03: 50 mg via INTRAVENOUS
  Administered 2018-09-03: 150 mg via INTRAVENOUS

## 2018-09-03 SURGICAL SUPPLY — 38 items
BNDG GAUZE ELAST 4 BULKY (GAUZE/BANDAGES/DRESSINGS) ×2 IMPLANT
CATH ROBINSON RED A/P 12FR (CATHETERS) ×3 IMPLANT
CORD BIPOLAR FORCEPS 12FT (ELECTRODE) ×2 IMPLANT
COVER SURGICAL LIGHT HANDLE (MISCELLANEOUS) ×4 IMPLANT
DRAIN PENROSE 1/4X12 LTX STRL (WOUND CARE) ×3 IMPLANT
DRAPE HALF SHEET 40X57 (DRAPES) ×2 IMPLANT
DRAPE ORTHO SPLIT 77X108 STRL (DRAPES) ×3
DRAPE SURG ORHT 6 SPLT 77X108 (DRAPES) ×1 IMPLANT
ELECT COATED BLADE 2.86 ST (ELECTRODE) ×3 IMPLANT
ELECT REM PT RETURN 9FT ADLT (ELECTROSURGICAL) ×3
ELECTRODE REM PT RTRN 9FT ADLT (ELECTROSURGICAL) ×1 IMPLANT
FORCEPS BIPOLAR SPETZLER 8 1.0 (NEUROSURGERY SUPPLIES) ×2 IMPLANT
GAUZE 4X4 16PLY RFD (DISPOSABLE) ×3 IMPLANT
GAUZE SPONGE 4X4 12PLY STRL (GAUZE/BANDAGES/DRESSINGS) ×2 IMPLANT
GLOVE BIO SURGEON STRL SZ7.5 (GLOVE) ×3 IMPLANT
GOWN STRL REUS W/ TWL LRG LVL3 (GOWN DISPOSABLE) ×1 IMPLANT
GOWN STRL REUS W/TWL LRG LVL3 (GOWN DISPOSABLE) ×6
KIT BASIN OR (CUSTOM PROCEDURE TRAY) ×3 IMPLANT
KIT TURNOVER KIT B (KITS) ×3 IMPLANT
MARKER SKIN DUAL TIP RULER LAB (MISCELLANEOUS) ×3 IMPLANT
NDL HYPO 25GX1X1/2 BEV (NEEDLE) ×1 IMPLANT
NEEDLE HYPO 25GX1X1/2 BEV (NEEDLE) ×3 IMPLANT
NS IRRIG 1000ML POUR BTL (IV SOLUTION) ×3 IMPLANT
PACK SURGICAL SETUP 50X90 (CUSTOM PROCEDURE TRAY) ×3 IMPLANT
PAD ARMBOARD 7.5X6 YLW CONV (MISCELLANEOUS) ×6 IMPLANT
PENCIL BUTTON HOLSTER BLD 10FT (ELECTRODE) ×3 IMPLANT
POSITIONER HEAD DONUT 9IN (MISCELLANEOUS) ×2 IMPLANT
STAPLER VISISTAT 35W (STAPLE) ×2 IMPLANT
SUT ETHILON 2 0 FS 18 (SUTURE) ×5 IMPLANT
SUT SILK 3 0 REEL (SUTURE) ×2 IMPLANT
SUT VIC AB 3-0 SH 27 (SUTURE) ×3
SUT VIC AB 3-0 SH 27X BRD (SUTURE) IMPLANT
SWAB COLLECTION DEVICE MRSA (MISCELLANEOUS) ×3 IMPLANT
SWAB CULTURE ESWAB REG 1ML (MISCELLANEOUS) ×3 IMPLANT
SYR BULB IRRIGATION 50ML (SYRINGE) ×3 IMPLANT
TUBE CONNECTING 12'X1/4 (SUCTIONS) ×1
TUBE CONNECTING 12X1/4 (SUCTIONS) ×2 IMPLANT
YANKAUER SUCT BULB TIP NO VENT (SUCTIONS) ×3 IMPLANT

## 2018-09-03 NOTE — Progress Notes (Addendum)
Pt was brought to [facility] [address] [place] from [facility]. Pt was alert and oriented x 4. Pt was educated about the floor and the call bell system. The call bell and the hospital phone was place with in the pt reach. The pt was instructed to call the nurse using the call bell for any needs. Pt had severe swelling and pain in the neck due to a tooth extraction. Pt's skin is intact, checked by the required two nurses. Pt reported a foot fungus. Pt was allowed to walk to the rest room with assistance per the pt's request. Pt requested that some items be taking to security. The reference number for the pt's items taking to security is [phone]. All items where signed off by the pt.

## 2018-09-03 NOTE — Anesthesia Postprocedure Evaluation (Signed)
Anesthesia Post Note  Patient: Sebrena S Saric  Procedure(s) Performed: INCISION AND DRAINAGE RIGHT NECK ABSCESS (Right )     Patient location during evaluation: PACU Anesthesia Type: General Level of consciousness: awake and alert Pain management: pain level controlled Vital Signs Assessment: post-procedure vital signs reviewed and stable Respiratory status: spontaneous breathing, nonlabored ventilation, respiratory function stable and patient connected to nasal cannula oxygen Cardiovascular status: blood pressure returned to baseline and stable Postop Assessment: no apparent nausea or vomiting Anesthetic complications: no    Last Vitals:  Vitals:   09/03/18 1849 09/03/18 2051  BP: 131/76 109/68  Pulse:  81  Resp:  17  Temp: 36.6 C 36.6 C  SpO2:  92%    Last Pain:  Vitals:   09/03/18 2052  TempSrc:   PainSc: 8                  Ryan P Ellender

## 2018-09-03 NOTE — Progress Notes (Signed)
PROGRESS NOTE  Adrienne [name]:[id number] DOB: 01/01/1956 DOA: 09/02/2018 PCP: Asencion Noble, MD   LOS: 1 day   Patient is from: Home.  Brief Narrative / Interim history: 63 year old female with COPD, DM-2, HLD, bipolar disorder, anxiety, depression and ADD?[name]  She presented to [facility] on 6/26 with right jaw pain and swelling after recent dental extraction on 6/22.  CT showed right jaw and neck abscess and cellulitis.  Failed outpatient treatment with clindamycin.  Transferred to [facility] for ENT evaluation and care.  Subjective: No major events overnight of this morning.  Reports pain in her jaw after evaluation by ENT this morning.  Had a mild temperature to 100.1 earlier this morning.  Leukocytosis improving.  Lactic acidosis resolved. Plan for I&D by ENT this afternoon.  She denies dyspnea or dysphagia.  Objective: Vitals:   09/03/18 0740 09/03/18 0850 09/03/18 0858 09/03/18 1138  BP: 95/80 (!) 94/59 112/62 112/76  Pulse: 74  70   Resp: 14  20   Temp: 98 F (36.7 C) 97.7 F (36.5 C)  97.8 F (36.6 C)  TempSrc: Oral Oral  Oral  SpO2: 92%  93% 93%  Weight:      Height:        Intake/Output Summary (Last 24 hours) at 09/03/2018 1228 Last data filed at 09/03/2018 1140 Gross per 24 hour  Intake 248.01 ml  Output 600 ml  Net -351.99 ml   Filed Weights   09/02/18 1131  Weight: 75.8 kg    Examination:  GENERAL: No acute distress.  Appears well.  HEENT: MMM.  Vision and hearing grossly intact.  Swelling and tenderness over her right lower face and neck.  Trismus.  Increased warmth to touch. LUNGS:  No IWOB. Good air movement bilaterally. HEART:  RRR. Heart sounds normal.  ABD: Bowel sounds present. Soft. Non tender.  MSK/EXT:  Moves all extremities. No apparent deformity. No edema bilaterally.  SKIN: no apparent skin lesion or wound NEURO: Awake, alert and oriented appropriately.  No gross deficit.  PSYCH: Calm. Normal affect.    I have personally reviewed  the following labs and images: CBC: Recent Labs  Lab 09/02/18 1356 09/03/18 0816  WBC 24.2* 16.7*  NEUTROABS 20.1*  --   HGB 13.7 12.2  HCT 42.3 37.4  MCV 92.0 92.8  PLT 364 694   Basic Metabolic Panel: Recent Labs  Lab 09/02/18 1356  NA 132*  K 4.2  CL 92*  CO2 28  GLUCOSE 300*  BUN 28*  CREATININE 0.96  CALCIUM 9.3   GFR: Estimated Creatinine Clearance: 57.8 mL/min (by C-G formula based on SCr of 0.96 mg/dL). Liver Function Tests: No results for input(s): AST, ALT, ALKPHOS, BILITOT, PROT, ALBUMIN in the last 168 hours. No results for input(s): LIPASE, AMYLASE in the last 168 hours. No results for input(s): AMMONIA in the last 168 hours. Coagulation Profile: No results for input(s): INR, PROTIME in the last 168 hours. Cardiac Enzymes: No results for input(s): CKTOTAL, CKMB, CKMBINDEX, TROPONINI in the last 168 hours. BNP (last 3 results) No results for input(s): PROBNP in the last 8760 hours. HbA1C: Recent Labs    09/02/18 1742  HGBA1C 8.8*   CBG: Recent Labs  Lab 09/02/18 2039 09/03/18 0009 09/03/18 0413 09/03/18 0848 09/03/18 1150  GLUCAP 217* 111* 120* 148* 152*   Lipid Profile: No results for input(s): CHOL, HDL, LDLCALC, TRIG, CHOLHDL, LDLDIRECT in the last 72 hours. Thyroid Function Tests: No results for input(s): TSH, T4TOTAL, FREET4, T3FREE,  THYROIDAB in the last 72 hours. Anemia Panel: No results for input(s): VITAMINB12, FOLATE, FERRITIN, TIBC, IRON, RETICCTPCT in the last 72 hours. Urine analysis:    Component Value Date/Time   COLORURINE YELLOW 09/02/2018 1151   APPEARANCEUR HAZY (A) 09/02/2018 1151   LABSPEC >1.046 (H) 09/02/2018 1151   PHURINE 5.0 09/02/2018 1151   GLUCOSEU 150 (A) 09/02/2018 1151   HGBUR SMALL (A) 09/02/2018 1151   BILIRUBINUR NEGATIVE 09/02/2018 1151   KETONESUR NEGATIVE 09/02/2018 1151   PROTEINUR 30 (A) 09/02/2018 1151   UROBILINOGEN 0.2 12/16/2007 1404   NITRITE NEGATIVE 09/02/2018 1151   LEUKOCYTESUR  NEGATIVE 09/02/2018 1151   Sepsis Labs: Invalid input(s): PROCALCITONIN, LACTICIDVEN  Recent Results (from the past 240 hour(s))  Blood Culture (routine x 2)     Status: None (Preliminary result)   Collection Time: 09/02/18 12:15 PM   Specimen: BLOOD LEFT ARM  Result Value Ref Range Status   Specimen Description BLOOD LEFT ARM  Final   Special Requests   Final    BOTTLES DRAWN AEROBIC AND ANAEROBIC Blood Culture adequate volume   Culture   Final    NO GROWTH < 24 HOURS Performed at [facility], [address], [place], [place] [address]    Report Status PENDING  Incomplete  SARS Coronavirus 2 (CEPHEID - Performed in [place] hospital lab), Hosp Order     Status: None   Collection Time: 09/02/18 12:16 PM   Specimen: Nasopharyngeal Swab  Result Value Ref Range Status   SARS Coronavirus 2 NEGATIVE NEGATIVE Final    Comment: (NOTE) If result is NEGATIVE SARS-CoV-2 target nucleic acids are NOT DETECTED. The SARS-CoV-2 RNA is generally detectable in upper and lower  respiratory specimens during the acute phase of infection. The lowest  concentration of SARS-CoV-2 viral copies this assay can detect is 250  copies / mL. A negative result does not preclude SARS-CoV-2 infection  and should not be used as the sole basis for treatment or other  patient management decisions.  A negative result may occur with  improper specimen collection / handling, submission of specimen other  than nasopharyngeal swab, presence of viral mutation(s) within the  areas targeted by this assay, and inadequate number of viral copies  (<250 copies / mL). A negative result must be combined with clinical  observations, patient history, and epidemiological information. If result is POSITIVE SARS-CoV-2 target nucleic acids are DETECTED. The SARS-CoV-2 RNA is generally detectable in upper and lower  respiratory specimens dur ing the acute phase of infection.  Positive  results are indicative of active infection  with SARS-CoV-2.  Clinical  correlation with patient history and other diagnostic information is  necessary to determine patient infection status.  Positive results do  not rule out bacterial infection or co-infection with other viruses. If result is PRESUMPTIVE POSTIVE SARS-CoV-2 nucleic acids MAY BE PRESENT.   A presumptive positive result was obtained on the submitted specimen  and confirmed on repeat testing.  While 2019 novel coronavirus  (SARS-CoV-2) nucleic acids may be present in the submitted sample  additional confirmatory testing may be necessary for epidemiological  and / or clinical management purposes  to differentiate between  SARS-CoV-2 and other Sarbecovirus currently known to infect humans.  If clinically indicated additional testing with an alternate test  methodology [phone]) is advised. The SARS-CoV-2 RNA is generally  detectable in upper and lower respiratory sp ecimens during the acute  phase of infection. The expected result is Negative. Fact Sheet for Patients:  StrictlyIdeas.no Fact Sheet for Healthcare Providers: BankingDealers.co.za This test is not yet approved or cleared by the [place] FDA and has been authorized for detection and/or diagnosis of SARS-CoV-2 by FDA under an Emergency Use Authorization (EUA).  This EUA will remain in effect (meaning this test can be used) for the duration of the COVID-19 declaration under Section 564(b)(1) of the Act, 21 U.S.C. section 360bbb-3(b)(1), unless the authorization is terminated or revoked sooner. Performed at [facility], [address] [place], [place] [address]   Blood Culture (routine x 2)     Status: None (Preliminary result)   Collection Time: 09/02/18 12:23 PM   Specimen: BLOOD RIGHT ARM  Result Value Ref Range Status   Specimen Description BLOOD RIGHT ARM  Final   Special Requests   Final    BOTTLES DRAWN AEROBIC AND ANAEROBIC Blood Culture results  may not be optimal due to an excessive volume of blood received in culture bottles   Culture   Final    NO GROWTH < 24 HOURS Performed at [facility], [address] [place], [place] [address]    Report Status PENDING  Incomplete     Radiology Studies: Ct Soft Tissue Neck W Contrast  Addendum Date: 09/02/2018   ADDENDUM REPORT: 09/02/2018 15:46 ADDENDUM: Study discussed by telephone with Dr. Noemi Chapel on 09/02/2018 at 1521 hours. Electronically Signed   By: Genevie Ann M.D.   On: 09/02/2018 15:46   Result Date: 09/02/2018 CLINICAL DATA:  63 year old female status post dental extractions four days ago. Jaw infection not improving. EXAM: CT NECK WITH CONTRAST TECHNIQUE: Multidetector CT imaging of the neck was performed using the standard protocol following the bolus administration of intravenous contrast. CONTRAST:  67mL OMNIPAQUE IOHEXOL 300 MG/ML  SOLN COMPARISON:  Report of brain MRI 10/13/2006 (no images available). FINDINGS: Pharynx and larynx: Leftward mass effect on the pharynx and supraglottic larynx related to the abnormality of the right masticator and submandibular spaces. Negative glottis. Negative superior parapharyngeal spaces. There is a mild retropharyngeal effusion, along with partially retropharyngeal course of both carotids (more so the left). Salivary glands: The central sublingual space remains normal on series 2, image 44. But there is bulky phlegmon throughout the lateral right sublingual space, right submandibular space, and medial right masticator space. The abnormality extends from the hyoid bone cephalad toward the pterygoid plates, and includes serpiginous subperiosteal abscess along both superior and inferior aspect of the posterior body of the mandible (coronal series 4, image 38) and serpiginous fluid tracking inferiorly in the submandibular space toward the midline of the submental region (same image). The abnormality is inseparable from the right submandibular gland which  appears highly abnormal, and seems to have abscess fluid tracking within its parenchyma (sagittal image 56). The poorly marginated and somewhat discontinuous appearing abscess fluid encompasses an area of nearly 33 x 28 x 50 millimeters (AP by transverse by CC). Regional cellulitis. Superimposed dental extractions, further detailed below. The left submandibular gland remains within normal limits. The left parotid gland is normal. The right parotid gland is normal except at its inferior margin where it may be mildly secondarily inflamed. Thyroid: Negative aside from mild overlying strap muscle edema. Lymph nodes: Reactive right level 1 lymph nodes up to 7 millimeter short axis. Reactive right level 2A lymph nodes up to 10 millimeters. No cystic or necrotic nodes. Vascular: The major vascular structures in the neck and at the skull base are patent including the right IJ. There is a retropharyngeal course of  the carotids, more so the left. The left vertebral artery appears dominant. Limited intracranial: Negative. Visualized orbits: Negative. Mastoids and visualized paranasal sinuses: Clear. Skeleton: Right mandible molar and wisdom tooth extraction sites with surrounding subperiosteal abscess. There appears to be abscess within the posterior-most extraction site (coronal image 38 and series 2, image 33), and associated abnormal mandible mineralization about the roots of that extraction cavity (series 3, image 40). Left TMJ degeneration. Cervical spine degeneration. No other No acute osseous abnormality identified. Upper chest: Negative visible mediastinum aside from Calcified aortic atherosclerosis. Centrilobular emphysema and lung scarring greater on the left. IMPRESSION: 1. Extensive odontogenic phlegmon and serpiginous abscess occupying the right lower masticator, right submandibular, and lateral right sublingual spaces. Subperiosteal abscess in and around the posterior most right mandible tooth extraction site.  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 Complete involvement of the right submandibular gland, which appears infiltrated by abscess. Regional cellulitis and reactive lymphadenopathy. Associated leftward mass effect on the pharynx and supraglottic larynx. 2. The central sublingual space is spared. There is a small retropharyngeal effusion, but no other deep soft tissue space involvement in neck or upper chest. Electronically Signed: By: Genevie Ann M.D. 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 clindamycin. -Evaluated by ENT, Dr. Ulice Dash for I&D today. -Continue IV Levaquin and IV clindamycin -PRN fentanyl for pain -Continue IV fluid while n.p.o.  Leukocytosis: Likely due to the above.  Improving.  Lactic acidosis: Likely due to #1.  Resolved after IV fluid.  Poorly controlled NIDDM-2 with hyperglycemia: A1c 8.8%.  On metformin and glimepiride at home.  CBG within fair range now. -Hold p.o. regimens -Sliding scale insulin and CBG monitoring -We will continue statin after surgery  Bipolar disorder type I/anxiety/depression: Stable -Continue home lithium, Xanax, Effexor and Ritalin  DVT prophylaxis: SCD Code Status: Full code Family Communication: Patient would like to update her family after surgery and let me know if any question. Disposition Plan: Remains inpatient Consultants: ENT  Antimicrobials: Anti-infectives (From admission, onward)   Start     Dose/Rate Route Frequency Ordered Stop   09/02/18 2200  aztreonam (AZACTAM) 2 g in sodium chloride 0.9 % 100 mL IVPB  Status:  Discontinued     2 g 200 mL/hr over 30 Minutes Intravenous Every 8 hours 09/02/18 1536 09/02/18 1737   09/02/18 1745  clindamycin (CLEOCIN) IVPB 600 mg     600 mg 100 mL/hr over  30 Minutes Intravenous Every 8 hours 09/02/18 1737     09/02/18 1745  levofloxacin (LEVAQUIN) IVPB 750 mg     750 mg 100 mL/hr over 90 Minutes Intravenous Every 24 hours 09/02/18 1737     09/02/18 1530  aztreonam (AZACTAM) 2 g in sodium chloride 0.9 % 100 mL IVPB     2 g 200 mL/hr over 30 Minutes Intravenous  Once 09/02/18 1524 09/02/18 1808   09/02/18 1200  clindamycin (CLEOCIN) IVPB 900 mg     900 mg 100 mL/hr over 30 Minutes Intravenous  Once 09/02/18 1152 09/02/18 1400      Scheduled Meds:  insulin aspart  0-15 Units Subcutaneous Q4H   lithium carbonate  600 mg Oral QHS   loratadine  10 mg Oral Daily   methylphenidate  20 mg Oral TID WC   pantoprazole  40 mg Oral Daily   rosuvastatin  20 mg Oral Daily   venlafaxine XR  150 mg Oral Q breakfast   Continuous Infusions:  clindamycin (CLEOCIN) IV 600 mg (09/03/18 0918)   levofloxacin (LEVAQUIN) IV Stopped (09/02/18 2108)   PRN Meds:.acetaminophen **OR** acetaminophen, albuterol, ALPRAZolam, diphenhydrAMINE **OR** diphenhydrAMINE, HYDROcodone-acetaminophen, ondansetron **OR** ondansetron (ZOFRAN) IV   Thimothy Barretta T. [place]  If 7PM-7AM, please contact night-coverage [email or web address] Password [facility] 09/03/2018, 12:28 PM

## 2018-09-03 NOTE — Plan of Care (Signed)
  Problem: Clinical Measurements: Goal: Will remain free from infection Outcome: Progressing   Problem: Coping: Goal: Level of anxiety will decrease Outcome: Progressing   Problem: Pain Managment: Goal: General experience of comfort will improve Outcome: Progressing   

## 2018-09-03 NOTE — Progress Notes (Signed)
Spoke with pt family and gave and update on pt's status post-op. RN to continue to monitor.

## 2018-09-03 NOTE — Anesthesia Preprocedure Evaluation (Addendum)
Anesthesia Evaluation  Patient identified by MRN, date of birth, ID band Patient awake    Reviewed: Allergy & Precautions, NPO status , Patient's Chart, lab work & pertinent test results  Airway Mallampati: IV     Mouth opening: Limited Mouth Opening  Dental  (+) Dental Advisory Given   Pulmonary asthma , sleep apnea , COPD, Current Smoker,    Pulmonary exam normal        Cardiovascular hypertension, Normal cardiovascular exam  ECG: Sinus rhythm   Neuro/Psych  Headaches, Seizures -, Well Controlled,  PSYCHIATRIC DISORDERS Anxiety Depression Bipolar Disorder Brain injury   Neuromuscular disease    GI/Hepatic Neg liver ROS, GERD  Medicated and Controlled,  Endo/Other  diabetes, Oral Hypoglycemic Agents  Renal/GU negative Renal ROS     Musculoskeletal RLS (restless legs syndrome)   Abdominal   Peds  Hematology HLD   Anesthesia Other Findings right neck abscess  Reproductive/Obstetrics                           Anesthesia Physical Anesthesia Plan  ASA: III  Anesthesia Plan: General   Post-op Pain Management:    Induction: Intravenous  PONV Risk Score and Plan: 2 and Ondansetron, Dexamethasone, Midazolam and Treatment may vary due to age or medical condition  Airway Management Planned: Oral ETT and Video Laryngoscope Planned  Additional Equipment:   Intra-op Plan:   Post-operative Plan: Extubation in OR and Possible Post-op intubation/ventilation  Informed Consent: I have reviewed the patients History and Physical, chart, labs and discussed the procedure including the risks, benefits and alternatives for the proposed anesthesia with the patient or authorized representative who has indicated his/her understanding and acceptance.     Dental advisory given  Plan Discussed with: CRNA  Anesthesia Plan Comments:        Anesthesia Quick Evaluation

## 2018-09-03 NOTE — Consult Note (Signed)
Reason for Consult: jaw swelling Referring Physician: Zada Finders, MD  Adrienne [name] is an 63 y.o. female.  CC: swelling right jaw after tooth extractions    HPI: s/p dental extractions 08/29/2018 right mandible. Denies prior swelling, fevers. Placed on Cleocin post-operatively by dentist but swelling worsened. Seen in ER [name] 09/02/2018 and admitted. Transferred to [facility] for further treatment.   Past Medical History:  Diagnosis Date  . Allergic rhinitis   . Allergy   . Anemia    leukocytosis/thrombocytosis--Dr. Tressie Stalker  . Anxiety   . Arthritis   . Asthma   . 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 Closed head injury 2005  . Complication of anesthesia    "my blood pressure dropped"  . COPD (chronic obstructive pulmonary disease) ([place])   . Depression    ? Bipolar Disorder--Dr, Jimmye Norman  . Emphysema of lung ([place])   . GERD (gastroesophageal reflux disease)    Barrett's esophagus  . Headache    migraines  . Hx of migraines   . Hypertension   . Low back pain   . Macular degeneration, bilateral    Dry-Dr. Iona Hansen  . Neuromuscular disorder ([place])   . NIDDM (non-insulin dependent diabetes mellitus)   . OSA (obstructive sleep apnea)    cannot tolerate mask  . RLS (restless legs syndrome)   . Seizure disorder ([place])    ? absence; due to head truama  . Seizures ([place])   . Sleep apnea   . Status post right hip replacement   . Status post rotator cuff surgery     Past Surgical History:  Procedure Laterality Date  . ABDOMINAL HYSTERECTOMY     heavy bleeding  . APPENDECTOMY    . BIOPSY  12/27/2014   Procedure: Esophageal BIOPSY;  Surgeon: Daneil Dolin, MD;  Location: [facility];  Service: Endoscopy;;  . BIOPSY  02/17/2018   Procedure: BIOPSY;  Surgeon: Daneil Dolin, MD;  Location[facility];  Service: Endoscopy;;  (gastric)  . COLONOSCOPY WITH ESOPHAGOGASTRODUODENOSCOPY (EGD)  12/08   TCS/EGD: + Barrett's,+ gastritis, friable anal canal, hyperplastic polyp  .  COLONOSCOPY WITH PROPOFOL N/A 12/27/2014   RMR: Internal hemorrhoids likely source of hematochezia. Redundant colon.   . ESOPHAGOGASTRODUODENOSCOPY  11/16/2002   Dr.Rourk- salmon-colored epithelium in the upper esophageal sphincter previous bx= ectopic gastric mucosa, salmon colored epithelium at the distal esophagus proviously bx= barretts esophagus o/w the esophageal mucosa appeared normal. patulous esophagogastric junction, small hiatal hernia, gastric mucosa o/w appeared normal. normal D1 and D2  . ESOPHAGOGASTRODUODENOSCOPY (EGD) WITH PROPOFOL N/A 12/27/2014   RMR: Abnoraml distal esophagus consistiant with prior diagnosis fo Barretts esophagus- Status post biopsy. Hiatal hernia. Antral erosions status post gastric biopsy,  . ESOPHAGOGASTRODUODENOSCOPY (EGD) WITH PROPOFOL N/A 02/17/2018   Procedure: ESOPHAGOGASTRODUODENOSCOPY (EGD) WITH PROPOFOL;  Surgeon: Daneil Dolin, MD;  [facility];  Service: Endoscopy;  Laterality: N/A;  1:30pm  . FRACTURE SURGERY Left 03/07/2013   knee  . fx left knee Left 2014  . ORIF TIBIA PLATEAU Left 03/07/2013   Procedure: OPEN REDUCTION INTERNAL FIXATION (ORIF) TIBIAL PLATEAU;  Surgeon: Renette Butters, MD;  Location: [facility];  Service: Orthopedics;  Laterality: Left;  . REVISION TOTAL HIP ARTHROPLASTY     Right  . ROTATOR CUFF REPAIR     Bilaterally  . SHOULDER INJECTION Left 03/07/2013   Procedure: SHOULDER INJECTION ; Depomedrol + 0.25% Marcaine mixture 4:1;  Surgeon: Renette Butters, MD;  Location: [address];  Service: Orthopedics;  Laterality: Left;  . TONSILLECTOMY    . TOTAL HIP ARTHROPLASTY Left 06/01/2016   Procedure: LEFT TOTAL HIP ARTHROPLASTY ANTERIOR APPROACH;  Surgeon: Gaynelle Arabian, MD;  Location: [facility];  Service: Orthopedics;  Laterality: Left;  [name] VESICOVAGINAL FISTULA CLOSURE W/ TAH      Family History  Problem Relation Age of Onset  . Heart disease Father 55  . Atrial fibrillation Mother   . Cancer Mother        carcinoid  .  Diabetes Maternal Grandfather   . Cancer Maternal Grandfather   . Heart disease Maternal Grandmother   . Thyroid disease Son     Social History:  reports that she has been smoking cigarettes. She started smoking about 47 years ago. She has a 20.00 pack-year smoking history. She has never used smokeless tobacco. She reports that she does not drink alcohol or use drugs.  Allergies:  Allergies  Allergen Reactions  . Codeine Other (See Comments)    REACTION: headaches  . Dilaudid [Hydromorphone Hcl] Itching  . Morphine And Related Itching and Other (See Comments)    Side effect  . Penicillins Rash and Other (See Comments)    Has patient had a PCN reaction causing immediate rash, facial/tongue/throat swelling, SOB or lightheadedness with hypotension: Yes Has patient had a PCN reaction causing severe rash involving mucus membranes or skin necrosis: Yes Has patient had a PCN reaction that required hospitalization No Has patient had a PCN reaction occurring within the last 10 years: No If all of the above answers are "NO", then may proceed with Cephalosporin use.   . Sulfonamide Derivatives Rash    Medications: I have reviewed the patient's current medications.  Results for orders placed or performed during the hospital encounter of 09/02/18 (from the past 48 hour(s))  Urinalysis, Routine w reflex microscopic     Status: Abnormal   Collection Time: 09/02/18 11:51 AM  Result Value Ref Range   Color, Urine YELLOW YELLOW   APPearance HAZY (A) CLEAR   Specific Gravity, Urine >1.046 (H) 1.005 - 1.030   pH 5.0 5.0 - 8.0   Glucose, UA 150 (A) NEGATIVE mg/dL   Hgb urine dipstick SMALL (A) NEGATIVE   Bilirubin Urine NEGATIVE NEGATIVE   Ketones, ur NEGATIVE NEGATIVE mg/dL   Protein, ur 30 (A) NEGATIVE mg/dL   Nitrite NEGATIVE NEGATIVE   Leukocytes,Ua NEGATIVE NEGATIVE   RBC / HPF 11-20 0 - 5 RBC/hpf   WBC, UA 0-5 0 - 5 WBC/hpf   Bacteria, UA RARE (A) NONE SEEN   Squamous Epithelial / LPF  0-5 0 - 5   Mucus PRESENT    Hyaline Casts, UA PRESENT     Comment: Performed at [facility], [address] [place], [place] [address]  Lactic acid, plasma     Status: Abnormal   Collection Time: 09/02/18 12:14 PM  Result Value Ref Range   Lactic Acid, Venous 2.5 (HH) 0.5 - 1.9 mmol/L    Comment: CRITICAL RESULT CALLED TO, READ BACK BY AND VERIFIED WITH: [name],M. [id number] ON 09/02/2018 BY EVA Performed at [facility], [address] [place], [place] [address]   Blood Culture (routine x 2)     Status: None (Preliminary result)   Collection Time: 09/02/18 12:15 PM   Specimen: BLOOD LEFT ARM  Result Value Ref Range   Specimen Description BLOOD LEFT ARM    Special Requests      BOTTLES DRAWN AEROBIC AND ANAEROBIC Blood Culture adequate volume  Culture      NO GROWTH < 24 HOURS Performed at [facility], [address] [place], [place] [address]    Report Status PENDING   SARS Coronavirus 2 (CEPHEID - Performed in [place] hospital lab), Hosp Order     Status: None   Collection Time: 09/02/18 12:16 PM   Specimen: Nasopharyngeal Swab  Result Value Ref Range   SARS Coronavirus 2 NEGATIVE NEGATIVE    Comment: (NOTE) If result is NEGATIVE SARS-CoV-2 target nucleic acids are NOT DETECTED. The SARS-CoV-2 RNA is generally detectable in upper and lower  respiratory specimens during the acute phase of infection. The lowest  concentration of SARS-CoV-2 viral copies this assay can detect is 250  copies / mL. A negative result does not preclude SARS-CoV-2 infection  and should not be used as the sole basis for treatment or other  patient management decisions.  A negative result may occur with  improper specimen collection / handling, submission of specimen other  than nasopharyngeal swab, presence of viral mutation(s) within the  areas targeted by this assay, and inadequate number of viral copies  (<250 copies / mL). A negative result must be combined with clinical  observations,  patient history, and epidemiological information. If result is POSITIVE SARS-CoV-2 target nucleic acids are DETECTED. The SARS-CoV-2 RNA is generally detectable in upper and lower  respiratory specimens dur ing the acute phase of infection.  Positive  results are indicative of active infection with SARS-CoV-2.  Clinical  correlation with patient history and other diagnostic information is  necessary to determine patient infection status.  Positive results do  not rule out bacterial infection or co-infection with other viruses. If result is PRESUMPTIVE POSTIVE SARS-CoV-2 nucleic acids MAY BE PRESENT.   A presumptive positive result was obtained on the submitted specimen  and confirmed on repeat testing.  While 2019 novel coronavirus  (SARS-CoV-2) nucleic acids may be present in the submitted sample  additional confirmatory testing may be necessary for epidemiological  and / or clinical management purposes  to differentiate between  SARS-CoV-2 and other Sarbecovirus currently known to infect humans.  If clinically indicated additional testing with an alternate test  methodology [phone]) is advised. The SARS-CoV-2 RNA is generally  detectable in upper and lower respiratory sp ecimens during the acute  phase of infection. The expected result is Negative. Fact Sheet for Patients:  StrictlyIdeas.no Fact Sheet for Healthcare Providers: BankingDealers.co.za This test is not yet approved or cleared by the [place] FDA and has been authorized for detection and/or diagnosis of SARS-CoV-2 by FDA under an Emergency Use Authorization (EUA).  This EUA will remain in effect (meaning this test can be used) for the duration of the COVID-19 declaration under Section 564(b)(1) of the Act, 21 U.S.C. section 360bbb-3(b)(1), unless the authorization is terminated or revoked sooner. Performed at [facility], [address] [place], [place] [address]    Blood Culture (routine x 2)     Status: None (Preliminary result)   Collection Time: 09/02/18 12:23 PM   Specimen: BLOOD RIGHT ARM  Result Value Ref Range   Specimen Description BLOOD RIGHT ARM    Special Requests      BOTTLES DRAWN AEROBIC AND ANAEROBIC Blood Culture results may not be optimal due to an excessive volume of blood received in culture bottles   Culture      NO GROWTH < 24 HOURS Performed at [facility], [address], [place], [place] [address]    Report Status PENDING  Lactic acid, plasma     Status: None   Collection Time: 09/02/18  1:56 PM  Result Value Ref Range   Lactic Acid, Venous 1.4 0.5 - 1.9 mmol/L    Comment: Performed at [facility], [address], [place], [place] [address]  CBC with Differential/Platelet     Status: Abnormal   Collection Time: 09/02/18  1:56 PM  Result Value Ref Range   WBC 24.2 (H) 4.0 - 10.5 K/uL   RBC 4.60 3.87 - 5.11 MIL/uL   Hemoglobin 13.7 12.0 - 15.0 g/dL   HCT 42.3 36.0 - 46.0 %   MCV 92.0 80.0 - 100.0 fL   MCH 29.8 26.0 - 34.0 pg   MCHC 32.4 30.0 - 36.0 g/dL   RDW 13.4 11.5 - 15.5 %   Platelets 364 150 - 400 K/uL   nRBC 0.0 0.0 - 0.2 %   Neutrophils Relative % 84 %   Neutro Abs 20.1 (H) 1.7 - 7.7 K/uL   Lymphocytes Relative 6 %   Lymphs Abs 1.6 0.7 - 4.0 K/uL   Monocytes Relative 9 %   Monocytes Absolute 2.2 (H) 0.1 - 1.0 K/uL   Eosinophils Relative 0 %   Eosinophils Absolute 0.0 0.0 - 0.5 K/uL   Basophils Relative 0 %   Basophils Absolute 0.1 0.0 - 0.1 K/uL   Immature Granulocytes 1 %   Abs Immature Granulocytes 0.16 (H) 0.00 - 0.07 K/uL    Comment: Performed at [facility], [address], [place], [place] [address]  Basic metabolic panel     Status: Abnormal   Collection Time: 09/02/18  1:56 PM  Result Value Ref Range   Sodium 132 (L) 135 - 145 mmol/L   Potassium 4.2 3.5 - 5.1 mmol/L   Chloride 92 (L) 98 - 111 mmol/L   CO2 28 22 - 32 mmol/L   Glucose, Bld 300 (H) 70 - 99 mg/dL   BUN 28 (H) 8 - 23 mg/dL    Creatinine, Ser 0.96 0.44 - 1.00 mg/dL   Calcium 9.3 8.9 - 10.3 mg/dL   GFR calc non Af Amer >60 >60 mL/min   GFR calc Af Amer >60 >60 mL/min   Anion gap 12 5 - 15    Comment: Performed at [facility], [address], [place], [place] [address]  HIV antibody (Routine Testing)     Status: None   Collection Time: 09/02/18  2:02 PM  Result Value Ref Range   HIV Screen 4th Generation wRfx Non Reactive Non Reactive    Comment: (NOTE) Performed At: [facility] [place], [place] [id number] Rush Farmer MD RJ:[id number]   Hemoglobin A1c     Status: Abnormal   Collection Time: 09/02/18  5:42 PM  Result Value Ref Range   Hgb A1c MFr Bld 8.8 (H) 4.8 - 5.6 %    Comment: (NOTE) Pre diabetes:          5.7%-6.4% Diabetes:              >6.4% Glycemic control for   <7.0% adults with diabetes    Mean Plasma Glucose 205.86 mg/dL    Comment: Performed at [facility], [address] [place], [place] [address]  CBG monitoring, ED     Status: Abnormal   Collection Time: 09/02/18  6:40 PM  Result Value Ref Range   Glucose-Capillary 187 (H) 70 - 99 mg/dL  CBG monitoring, ED     Status: Abnormal   Collection Time: 09/02/18  8:39 PM  Result Value Ref Range   Glucose-Capillary 217 (H) 70 - 99 mg/dL  Glucose, capillary     Status: Abnormal   Collection Time: 09/03/18 12:09 AM  Result Value Ref Range   Glucose-Capillary 111 (H) 70 - 99 mg/dL  Glucose, capillary     Status: Abnormal   Collection Time: 09/03/18  4:13 AM  Result Value Ref Range   Glucose-Capillary 120 (H) 70 - 99 mg/dL  CBC     Status: Abnormal   Collection Time: 09/03/18  8:16 AM  Result Value Ref Range   WBC 16.7 (H) 4.0 - 10.5 K/uL   RBC 4.03 3.87 - 5.11 MIL/uL   Hemoglobin 12.2 12.0 - 15.0 g/dL   HCT 37.4 36.0 - 46.0 %   MCV 92.8 80.0 - 100.0 fL   MCH 30.3 26.0 - 34.0 pg   MCHC 32.6 30.0 - 36.0 g/dL   RDW 13.3 11.5 - 15.5 %   Platelets 336 150 - 400 K/uL   nRBC 0.0 0.0 - 0.2 %     Comment: Performed at [facility], [place] [address], [place], [place] [address]  Glucose, capillary     Status: Abnormal   Collection Time: 09/03/18  8:48 AM  Result Value Ref Range   Glucose-Capillary 148 (H) 70 - 99 mg/dL    Ct Soft Tissue Neck W Contrast  Addendum Date: 09/02/2018   ADDENDUM REPORT: 09/02/2018 15:46 ADDENDUM: Study discussed by telephone with Dr. Noemi Chapel on 09/02/2018 at 1521 hours. Electronically Signed   By: Genevie Ann M.D.   On: 09/02/2018 15:46   Result Date: 09/02/2018 CLINICAL DATA:  63 year old female status post dental extractions four days ago. Jaw infection not improving. EXAM: CT NECK WITH CONTRAST TECHNIQUE: Multidetector CT imaging of the neck was performed using the standard protocol following the bolus administration of intravenous contrast. CONTRAST:  70mL OMNIPAQUE IOHEXOL 300 MG/ML  SOLN COMPARISON:  Report of brain MRI 10/13/2006 (no images available). FINDINGS: Pharynx and larynx: Leftward mass effect on the pharynx and supraglottic larynx related to the abnormality of the right masticator and submandibular spaces. Negative glottis. Negative superior parapharyngeal spaces. There is a mild retropharyngeal effusion, along with partially retropharyngeal course of both carotids (more so the left). Salivary glands: The central sublingual space remains normal on series 2, image 44. But there is bulky phlegmon throughout the lateral right sublingual space, right submandibular space, and medial right masticator space. The abnormality extends from the hyoid bone cephalad toward the pterygoid plates, and includes serpiginous subperiosteal abscess along both superior and inferior aspect of the posterior body of the mandible (coronal series 4, image 38) and serpiginous fluid tracking inferiorly in the submandibular space toward the midline of the submental region (same image). The abnormality is inseparable from the right submandibular gland which appears highly abnormal,  and seems to have abscess fluid tracking within its parenchyma (sagittal image 56). The poorly marginated and somewhat discontinuous appearing abscess fluid encompasses an area of nearly 33 x 28 x 50 millimeters (AP by transverse by CC). Regional cellulitis. Superimposed dental extractions, further detailed below. The left submandibular gland remains within normal limits. The left parotid gland is normal. The right parotid gland is normal except at its inferior margin where it may be mildly secondarily inflamed. Thyroid: Negative aside from mild overlying strap muscle edema. Lymph nodes: Reactive right level 1 lymph nodes up to 7 millimeter short axis. Reactive right level 2A lymph nodes up to 10 millimeters. No cystic  or necrotic nodes. Vascular: The major vascular structures in the neck and at the skull base are patent including the right IJ. There is a retropharyngeal course of the carotids, more so the left. The left vertebral artery appears dominant. Limited intracranial: Negative. Visualized orbits: Negative. Mastoids and visualized paranasal sinuses: Clear. Skeleton: Right mandible molar and wisdom tooth extraction sites with surrounding subperiosteal abscess. There appears to be abscess within the posterior-most extraction site (coronal image 38 and series 2, image 33), and associated abnormal mandible mineralization about the roots of that extraction cavity (series 3, image 40). Left TMJ degeneration. Cervical spine degeneration. No other No acute osseous abnormality identified. Upper chest: Negative visible mediastinum aside from Calcified aortic atherosclerosis. Centrilobular emphysema and lung scarring greater on the left. IMPRESSION: 1. Extensive odontogenic phlegmon and serpiginous abscess occupying the right lower masticator, right submandibular, and lateral right sublingual spaces. Subperiosteal abscess in and around the posterior most right mandible tooth extraction site. 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 Complete involvement of the right submandibular gland, which appears infiltrated by abscess. Regional cellulitis and reactive lymphadenopathy. Associated leftward mass effect on the pharynx and supraglottic larynx. 2. The central sublingual space is spared. There is a small retropharyngeal effusion, but no other deep soft tissue space involvement in neck or upper chest. Electronically Signed: By: Genevie Ann M.D. On: 09/02/2018 15:15    ROS Blood pressure (!) 94/59, pulse 74, temperature 97.7 F (36.5 C), temperature source Oral, resp. rate 14, height 5\' 3"  (1.6 m), weight 75.8 kg, SpO2 92 %. General appearance: alert, cooperative and moderately obese Head: Normocephalic, without obvious abnormality, atraumatic Eyes: negative Nose: Nares normal. Septum midline. Mucosa normal. No drainage or sinus tenderness. Throat: Recent extraction sites right mandible with debris. buccal vestibule edema right mandible. Trismus to approximately 63mm. Pharynx clear.  Neck: no adenopathy, supple, symmetrical, trachea midline and right submandibular and submental erythema and edema, crossing midline anterior sublingual.   Resp: clear to auscultation bilaterally Cardio: regular rate and rhythm, S1, S2 normal, no murmur, click, rub or gallop  Assessment/Plan: Right Submandibular,sublingual, and masseteric space infection. Right Submandibular gland involvement. Discussed with Melida Quitter, MD, who agrees to assume care of patient. Please call if I can be of further help.  Diona Browner 09/03/2018, 9:03 AM  [phone]

## 2018-09-03 NOTE — Brief Op Note (Signed)
09/03/2018  4:50 PM  PATIENT:  Pollyann Kennedy Boen  63 y.o. female  PRE-OPERATIVE DIAGNOSIS:  right neck abscess  POST-OPERATIVE DIAGNOSIS:  right neck abscess  PROCEDURE:  Procedure(s): INCISION AND DRAINAGE RIGHT NECK ABSCESS (Right)  SURGEON:  Surgeon(s) and Role:    Melida Quitter, MD - Primary  PHYSICIAN ASSISTANT:   ASSISTANTS: none   ANESTHESIA:   general  EBL: Minimal  BLOOD ADMINISTERED:none  DRAINS: Penrose drain in the right neck   LOCAL MEDICATIONS USED:  LIDOCAINE   SPECIMEN:  Source of Specimen:  Right neck abscess culture  DISPOSITION OF SPECIMEN:  MICRO  COUNTS:  YES  TOURNIQUET:  * No tourniquets in log *  DICTATION: .Other Dictation: Dictation Number [id number]  PLAN OF CARE: Return to hospital room  PATIENT DISPOSITION:  PACU - hemodynamically stable.   Delay start of Pharmacological VTE agent (>24hrs) due to surgical blood loss or risk of bleeding: no

## 2018-09-03 NOTE — Consult Note (Signed)
Reason for Consult: Neck abscess Referring Physician: [name] is an 63 y.o. female.  HPI: 63 year old female with diabetes and other medical problems underwent removal of two right lower teeth on 6/22 by her dentist.  She was prescribed oral clindamycin.  Swelling worsened in the right neck thereafter with worsening pain.  She was referred to oral surgery but presented to [facility] ER before she could be seen.  CT imaging was performed and she was transferred to [facility].  Dr. Hoyt [name] was asked to evaluate the patient and called me for consultation.  Past Medical History:  Diagnosis Date  . Allergic rhinitis   . Allergy   . Anemia    leukocytosis/thrombocytosis--Dr. Tressie Stalker  . Anxiety   . Arthritis   . Asthma   . 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 Closed head injury 2005  . Complication of anesthesia    "my blood pressure dropped"  . COPD (chronic obstructive pulmonary disease) ([place])   . Depression    ? Bipolar Disorder--Dr, Jimmye Norman  . Emphysema of lung ([place])   . GERD (gastroesophageal reflux disease)    Barrett's esophagus  . Headache    migraines  . Hx of migraines   . Hypertension   . Low back pain   . Macular degeneration, bilateral    Dry-Dr. Iona Hansen  . Neuromuscular disorder ([place])   . NIDDM (non-insulin dependent diabetes mellitus)   . OSA (obstructive sleep apnea)    cannot tolerate mask  . RLS (restless legs syndrome)   . Seizure disorder ([place])    ? absence; due to head truama  . Seizures ([place])   . Sleep apnea   . Status post right hip replacement   . Status post rotator cuff surgery     Past Surgical History:  Procedure Laterality Date  . ABDOMINAL HYSTERECTOMY     heavy bleeding  . APPENDECTOMY    . BIOPSY  12/27/2014   Procedure: Esophageal BIOPSY;  Surgeon: Daneil Dolin, MD;  Location: [facility] ORS;  Service: Endoscopy;;  . BIOPSY  02/17/2018   Procedure: BIOPSY;  Surgeon: Daneil Dolin, MD;  Location: [facility] END[facility] SU[facility];  Service:  Endoscopy;;  (gastric)  . COLONOSCOPY WITH ESOPHAGOGASTRODUODENOSCOPY (EGD)  12/08   TCS/EGD: + Barrett's,+ gastritis, friable anal canal, hyperplastic polyp  . COLONOSCOPY WITH PROPOFOL N/A 12/27/2014   RMR: Internal hemorrhoids likely source of hematochezia. Redundant colon.   . ESOPHAGOGASTRODUODENOSCOPY  11/16/2002   Dr.Rourk- salmon-colored epithelium in the upper esophageal sphincter previous bx= ectopic gastric mucosa, salmon colored epithelium at the distal esophagus proviously bx= barretts esophagus o/w the esophageal mucosa appeared normal. patulous esophagogastric junction, small hiatal hernia, gastric mucosa o/w appeared normal. normal D1 and D2  . ESOPHAGOGASTRODUODENOSCOPY (EGD) WITH PROPOFOL N/A 12/27/2014   RMR: Abnoraml distal esophagus consistiant with prior diagnosis fo Barretts esophagus- Status post biopsy. Hiatal hernia. Antral erosions status post gastric biopsy,  . ESOPHAGOGASTRODUODENOSCOPY (EGD) WITH PROPOFOL N/A 02/17/2018   Procedure: ESOPHAGOGASTRODUODENOSCOPY (EGD) WITH PROPOFOL;  Surgeon: Daneil Dolin, MD;  Location[facility];  Service: Endoscopy;  Laterality: N/A;  1:30pm  . FRACTURE SURGERY Left 03/07/2013   knee  . fx left knee Left 2014  . ORIF TIBIA PLATEAU Left 03/07/2013   Procedure: OPEN REDUCTION INTERNAL FIXATION (ORIF) TIBIAL PLATEAU;  Surgeon: Renette Butters, MD;  Location: [place];  Service: Orthopedics;  Laterality: Left;  . REVISION TOTAL HIP ARTHROPLASTY     Right  .  ROTATOR CUFF REPAIR     Bilaterally  . SHOULDER INJECTION Left 03/07/2013   Procedure: SHOULDER INJECTION ; Depomedrol + 0.25% Marcaine mixture 4:1;  Surgeon: Renette Butters, MD;  Location: [place];  Service: Orthopedics;  Laterality: Left;  . TONSILLECTOMY    . TOTAL HIP ARTHROPLASTY Left 06/01/2016   Procedure: LEFT TOTAL HIP ARTHROPLASTY ANTERIOR APPROACH;  Surgeon: Gaynelle Arabian, MD;  Location: [facility];  Service: Orthopedics;  Laterality: Left;  [name] VESICOVAGINAL FISTULA  CLOSURE W/ TAH      Family History  Problem Relation Age of Onset  . Heart disease Father 55  . Atrial fibrillation Mother   . Cancer Mother        carcinoid  . Diabetes Maternal Grandfather   . Cancer Maternal Grandfather   . Heart disease Maternal Grandmother   . Thyroid disease Son     Social History:  reports that she has been smoking cigarettes. She started smoking about 47 years ago. She has a 20.00 pack-year smoking history. She has never used smokeless tobacco. She reports that she does not drink alcohol or use drugs.  Allergies:  Allergies  Allergen Reactions  . Codeine Other (See Comments)    REACTION: headaches  . Dilaudid [Hydromorphone Hcl] Itching  . Morphine And Related Itching and Other (See Comments)    Side effect  . Penicillins Rash and Other (See Comments)    Has patient had a PCN reaction causing immediate rash, facial/tongue/throat swelling, SOB or lightheadedness with hypotension: Yes Has patient had a PCN reaction causing severe rash involving mucus membranes or skin necrosis: Yes Has patient had a PCN reaction that required hospitalization No Has patient had a PCN reaction occurring within the last 10 years: No If all of the above answers are "NO", then may proceed with Cephalosporin use.   . Sulfonamide Derivatives Rash    Medications: I have reviewed the patient's current medications.  Results for orders placed or performed during the hospital encounter of 09/02/18 (from the past 48 hour(s))  Urinalysis, Routine w reflex microscopic     Status: Abnormal   Collection Time: 09/02/18 11:51 AM  Result Value Ref Range   Color, Urine YELLOW YELLOW   APPearance HAZY (A) CLEAR   Specific Gravity, Urine >1.046 (H) 1.005 - 1.030   pH 5.0 5.0 - 8.0   Glucose, UA 150 (A) NEGATIVE mg/dL   Hgb urine dipstick SMALL (A) NEGATIVE   Bilirubin Urine NEGATIVE NEGATIVE   Ketones, ur NEGATIVE NEGATIVE mg/dL   Protein, ur 30 (A) NEGATIVE mg/dL   Nitrite NEGATIVE  NEGATIVE   Leukocytes,Ua NEGATIVE NEGATIVE   RBC / HPF 11-20 0 - 5 RBC/hpf   WBC, UA 0-5 0 - 5 WBC/hpf   Bacteria, UA RARE (A) NONE SEEN   Squamous Epithelial / LPF 0-5 0 - 5   Mucus PRESENT    Hyaline Casts, UA PRESENT     Comment: Performed at [facility], [address], [place], [place] [address]  Lactic acid, plasma     Status: Abnormal   Collection Time: 09/02/18 12:14 PM  Result Value Ref Range   Lactic Acid, Venous 2.5 (HH) 0.5 - 1.9 mmol/L    Comment: CRITICAL RESULT CALLED TO, READ BACK BY AND VERIFIED WITH: [name],M. [id number] ON 09/02/2018 BY EVA Performed at [facility], [address] [place], [place] [address]   Blood Culture (routine x 2)     Status: None (Preliminary result)   Collection Time: 09/02/18 12:15 PM  Specimen: BLOOD LEFT ARM  Result Value Ref Range   Specimen Description BLOOD LEFT ARM    Special Requests      BOTTLES DRAWN AEROBIC AND ANAEROBIC Blood Culture adequate volume   Culture      NO GROWTH < 24 HOURS Performed at [facility], [address] [place], [place] [address]    Report Status PENDING   SARS Coronavirus 2 (CEPHEID - Performed in [place] hospital lab), Hosp Order     Status: None   Collection Time: 09/02/18 12:16 PM   Specimen: Nasopharyngeal Swab  Result Value Ref Range   SARS Coronavirus 2 NEGATIVE NEGATIVE    Comment: (NOTE) If result is NEGATIVE SARS-CoV-2 target nucleic acids are NOT DETECTED. The SARS-CoV-2 RNA is generally detectable in upper and lower  respiratory specimens during the acute phase of infection. The lowest  concentration of SARS-CoV-2 viral copies this assay can detect is 250  copies / mL. A negative result does not preclude SARS-CoV-2 infection  and should not be used as the sole basis for treatment or other  patient management decisions.  A negative result may occur with  improper specimen collection / handling, submission of specimen other  than nasopharyngeal swab, presence of viral mutation(s)  within the  areas targeted by this assay, and inadequate number of viral copies  (<250 copies / mL). A negative result must be combined with clinical  observations, patient history, and epidemiological information. If result is POSITIVE SARS-CoV-2 target nucleic acids are DETECTED. The SARS-CoV-2 RNA is generally detectable in upper and lower  respiratory specimens dur ing the acute phase of infection.  Positive  results are indicative of active infection with SARS-CoV-2.  Clinical  correlation with patient history and other diagnostic information is  necessary to determine patient infection status.  Positive results do  not rule out bacterial infection or co-infection with other viruses. If result is PRESUMPTIVE POSTIVE SARS-CoV-2 nucleic acids MAY BE PRESENT.   A presumptive positive result was obtained on the submitted specimen  and confirmed on repeat testing.  While 2019 novel coronavirus  (SARS-CoV-2) nucleic acids may be present in the submitted sample  additional confirmatory testing may be necessary for epidemiological  and / or clinical management purposes  to differentiate between  SARS-CoV-2 and other Sarbecovirus currently known to infect humans.  If clinically indicated additional testing with an alternate test  methodology [phone]) is advised. The SARS-CoV-2 RNA is generally  detectable in upper and lower respiratory sp ecimens during the acute  phase of infection. The expected result is Negative. Fact Sheet for Patients:  StrictlyIdeas.no Fact Sheet for Healthcare Providers: BankingDealers.co.za This test is not yet approved or cleared by the [place] FDA and has been authorized for detection and/or diagnosis of SARS-CoV-2 by FDA under an Emergency Use Authorization (EUA).  This EUA will remain in effect (meaning this test can be used) for the duration of the COVID-19 declaration under Section 564(b)(1) of the Act,  21 U.S.C. section 360bbb-3(b)(1), unless the authorization is terminated or revoked sooner. Performed at [facility], [address] [place], [place] [address]   Blood Culture (routine x 2)     Status: None (Preliminary result)   Collection Time: 09/02/18 12:23 PM   Specimen: BLOOD RIGHT ARM  Result Value Ref Range   Specimen Description BLOOD RIGHT ARM    Special Requests      BOTTLES DRAWN AEROBIC AND ANAEROBIC Blood Culture results may not be optimal due to an excessive volume  of blood received in culture bottles   Culture      NO GROWTH < 24 HOURS Performed at [facility], [address], [place], [place] [address]    Report Status PENDING   Lactic acid, plasma     Status: None   Collection Time: 09/02/18  1:56 PM  Result Value Ref Range   Lactic Acid, Venous 1.4 0.5 - 1.9 mmol/L    Comment: Performed at [facility], [address] [place], [place] [address]  CBC with Differential/Platelet     Status: Abnormal   Collection Time: 09/02/18  1:56 PM  Result Value Ref Range   WBC 24.2 (H) 4.0 - 10.5 K/uL   RBC 4.60 3.87 - 5.11 MIL/uL   Hemoglobin 13.7 12.0 - 15.0 g/dL   HCT 42.3 36.0 - 46.0 %   MCV 92.0 80.0 - 100.0 fL   MCH 29.8 26.0 - 34.0 pg   MCHC 32.4 30.0 - 36.0 g/dL   RDW 13.4 11.5 - 15.5 %   Platelets 364 150 - 400 K/uL   nRBC 0.0 0.0 - 0.2 %   Neutrophils Relative % 84 %   Neutro Abs 20.1 (H) 1.7 - 7.7 K/uL   Lymphocytes Relative 6 %   Lymphs Abs 1.6 0.7 - 4.0 K/uL   Monocytes Relative 9 %   Monocytes Absolute 2.2 (H) 0.1 - 1.0 K/uL   Eosinophils Relative 0 %   Eosinophils Absolute 0.0 0.0 - 0.5 K/uL   Basophils Relative 0 %   Basophils Absolute 0.1 0.0 - 0.1 K/uL   Immature Granulocytes 1 %   Abs Immature Granulocytes 0.16 (H) 0.00 - 0.07 K/uL    Comment: Performed at [facility], [address] [place], [place] [address]  Basic metabolic panel     Status: Abnormal   Collection Time: 09/02/18  1:56 PM  Result Value Ref Range   Sodium 132 (L) 135 - 145  mmol/L   Potassium 4.2 3.5 - 5.1 mmol/L   Chloride 92 (L) 98 - 111 mmol/L   CO2 28 22 - 32 mmol/L   Glucose, Bld 300 (H) 70 - 99 mg/dL   BUN 28 (H) 8 - 23 mg/dL   Creatinine, Ser 0.96 0.44 - 1.00 mg/dL   Calcium 9.3 8.9 - 10.3 mg/dL   GFR calc non Af Amer >60 >60 mL/min   GFR calc Af Amer >60 >60 mL/min   Anion gap 12 5 - 15    Comment: Performed at [facility], [address] [place], [place] [address]  HIV antibody (Routine Testing)     Status: None   Collection Time: 09/02/18  2:02 PM  Result Value Ref Range   HIV Screen 4th Generation wRfx Non Reactive Non Reactive    Comment: (NOTE) Performed At: [facility] [place], [place] [id number] Rush Farmer MD IR:[id number]   Hemoglobin A1c     Status: Abnormal   Collection Time: 09/02/18  5:42 PM  Result Value Ref Range   Hgb A1c MFr Bld 8.8 (H) 4.8 - 5.6 %    Comment: (NOTE) Pre diabetes:          5.7%-6.4% Diabetes:              >6.4% Glycemic control for   <7.0% adults with diabetes    Mean Plasma Glucose 205.86 mg/dL    Comment: Performed at [facility], [address], [place],  [address]  CBG monitoring, ED     Status:  Abnormal   Collection Time: 09/02/18  6:40 PM  Result Value Ref Range   Glucose-Capillary 187 (H) 70 - 99 mg/dL  CBG monitoring, ED     Status: Abnormal   Collection Time: 09/02/18  8:39 PM  Result Value Ref Range   Glucose-Capillary 217 (H) 70 - 99 mg/dL  Glucose, capillary     Status: Abnormal   Collection Time: 09/03/18 12:09 AM  Result Value Ref Range   Glucose-Capillary 111 (H) 70 - 99 mg/dL  Glucose, capillary     Status: Abnormal   Collection Time: 09/03/18  4:13 AM  Result Value Ref Range   Glucose-Capillary 120 (H) 70 - 99 mg/dL  CBC     Status: Abnormal   Collection Time: 09/03/18  8:16 AM  Result Value Ref Range   WBC 16.7 (H) 4.0 - 10.5 K/uL   RBC 4.03 3.87 - 5.11 MIL/uL   Hemoglobin 12.2 12.0 - 15.0 g/dL   HCT 37.4 36.0 - 46.0 %   MCV 92.8  80.0 - 100.0 fL   MCH 30.3 26.0 - 34.0 pg   MCHC 32.6 30.0 - 36.0 g/dL   RDW 13.3 11.5 - 15.5 %   Platelets 336 150 - 400 K/uL   nRBC 0.0 0.0 - 0.2 %    Comment: Performed at [facility], [place] [address] [place], [place] [address]  Glucose, capillary     Status: Abnormal   Collection Time: 09/03/18  8:48 AM  Result Value Ref Range   Glucose-Capillary 148 (H) 70 - 99 mg/dL    Ct Soft Tissue Neck W Contrast  Addendum Date: 09/02/2018   ADDENDUM REPORT: 09/02/2018 15:46 ADDENDUM: Study discussed by telephone with Dr. Noemi Chapel on 09/02/2018 at 1521 hours. Electronically Signed   By: Genevie Ann M.D.   On: 09/02/2018 15:46   Result Date: 09/02/2018 CLINICAL DATA:  63 year old female status post dental extractions four days ago. Jaw infection not improving. EXAM: CT NECK WITH CONTRAST TECHNIQUE: Multidetector CT imaging of the neck was performed using the standard protocol following the bolus administration of intravenous contrast. CONTRAST:  60mL OMNIPAQUE IOHEXOL 300 MG/ML  SOLN COMPARISON:  Report of brain MRI 10/13/2006 (no images available). FINDINGS: Pharynx and larynx: Leftward mass effect on the pharynx and supraglottic larynx related to the abnormality of the right masticator and submandibular spaces. Negative glottis. Negative superior parapharyngeal spaces. There is a mild retropharyngeal effusion, along with partially retropharyngeal course of both carotids (more so the left). Salivary glands: The central sublingual space remains normal on series 2, image 44. But there is bulky phlegmon throughout the lateral right sublingual space, right submandibular space, and medial right masticator space. The abnormality extends from the hyoid bone cephalad toward the pterygoid plates, and includes serpiginous subperiosteal abscess along both superior and inferior aspect of the posterior body of the mandible (coronal series 4, image 38) and serpiginous fluid tracking inferiorly in the submandibular  space toward the midline of the submental region (same image). The abnormality is inseparable from the right submandibular gland which appears highly abnormal, and seems to have abscess fluid tracking within its parenchyma (sagittal image 56). The poorly marginated and somewhat discontinuous appearing abscess fluid encompasses an area of nearly 33 x 28 x 50 millimeters (AP by transverse by CC). Regional cellulitis. Superimposed dental extractions, further detailed below. The left submandibular gland remains within normal limits. The left parotid gland is normal. The right parotid gland is normal except at its inferior margin where it may  be mildly secondarily inflamed. Thyroid: Negative aside from mild overlying strap muscle edema. Lymph nodes: Reactive right level 1 lymph nodes up to 7 millimeter short axis. Reactive right level 2A lymph nodes up to 10 millimeters. No cystic or necrotic nodes. Vascular: The major vascular structures in the neck and at the skull base are patent including the right IJ. There is a retropharyngeal course of the carotids, more so the left. The left vertebral artery appears dominant. Limited intracranial: Negative. Visualized orbits: Negative. Mastoids and visualized paranasal sinuses: Clear. Skeleton: Right mandible molar and wisdom tooth extraction sites with surrounding subperiosteal abscess. There appears to be abscess within the posterior-most extraction site (coronal image 38 and series 2, image 33), and associated abnormal mandible mineralization about the roots of that extraction cavity (series 3, image 40). Left TMJ degeneration. Cervical spine degeneration. No other No acute osseous abnormality identified. Upper chest: Negative visible mediastinum aside from Calcified aortic atherosclerosis. Centrilobular emphysema and lung scarring greater on the left. IMPRESSION: 1. Extensive odontogenic phlegmon and serpiginous abscess occupying the right lower masticator, right  submandibular, and lateral right sublingual spaces. Subperiosteal abscess in and around the posterior most right mandible tooth extraction site. 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 Complete involvement of the right submandibular gland, which appears infiltrated by abscess. Regional cellulitis and reactive lymphadenopathy. Associated leftward mass effect on the pharynx and supraglottic larynx. 2. The central sublingual space is spared. There is a small retropharyngeal effusion, but no other deep soft tissue space involvement in neck or upper chest. Electronically Signed: By: Genevie Ann M.D. On: 09/02/2018 15:15    Review of Systems  HENT: Positive for sore throat.   Musculoskeletal: Positive for neck pain.  All other systems reviewed and are negative.  Blood pressure (!) 94/59, pulse 74, temperature 97.7 F (36.5 C), temperature source Oral, resp. rate 14, height 5\' 3"  (1.6 m), weight 75.8 kg, SpO2 92 %. Physical Exam  Constitutional: She is oriented to person, place, and time. She appears well-developed and well-nourished. No distress.  HENT:  Head: Normocephalic and atraumatic.  Right Ear: External ear normal.  Left Ear: External ear normal.  Nose: Nose normal.  Mouth/Throat: Oropharynx is clear and moist.  Eyes: Pupils are equal, round, and reactive to light. Conjunctivae and EOM are normal.  Neck:  Firm edema of right upper neck and submental region.  Cardiovascular: Normal rate.  Respiratory: Effort normal.  Neurological: She is alert and oriented to person, place, and time. No cranial nerve deficit.  Skin: Skin is warm and dry.  Psychiatric: She has a normal mood and affect. Her behavior is normal. Judgment and thought content normal.    Assessment/Plan: Right submandibular odontogenic abscess  I personally reviewed her neck CT demonstrating soft tissue inflammation and fluid collection in the right submandibular space.  I discussed findings  with the patient and recommended proceeding with incision and drainage of the right neck.  Risks, benefits, and alternatives were discussed and she expressed understanding and agreement.  A Penrose drain will be left in place for several days while she is treated inpatient with IV clindamycin.  Melida Quitter 09/03/2018, 9:21 AM

## 2018-09-03 NOTE — Transfer of Care (Signed)
Immediate Anesthesia Transfer of Care Note  Patient: Adrienne [name]  Procedure(s) Performed: INCISION AND DRAINAGE RIGHT NECK ABSCESS (Right )  Patient Location: PACU  Anesthesia Type:General  Level of Consciousness: awake  Airway & Oxygen Therapy: Patient Spontanous Breathing and Patient connected to nasal cannula oxygen  Post-op Assessment: Report given to RN and Post -op Vital signs reviewed and stable  Post vital signs: Reviewed and stable  Last Vitals:  Vitals Value Taken Time  BP 136/73 09/03/18 1710  Temp    Pulse 78 09/03/18 1712  Resp 14 09/03/18 1712  SpO2 96 % 09/03/18 1712  Vitals shown include unvalidated device data.  Last Pain:  Vitals:   09/03/18 1213  TempSrc:   PainSc: 9       Patients Stated Pain Goal: 0 (14/23/95 3202)  Complications: No apparent anesthesia complications

## 2018-09-03 NOTE — Anesthesia Procedure Notes (Signed)
Procedure Name: Intubation Date/Time: 09/03/2018 4:06 PM Performed by: Suzy Bouchard, CRNA Pre-anesthesia Checklist: Patient identified, Emergency Drugs available, Suction available, Patient being monitored and Timeout performed Patient Re-evaluated:Patient Re-evaluated prior to induction Oxygen Delivery Method: Circle system utilized Preoxygenation: Pre-oxygenation with 100% oxygen Induction Type: IV induction Ventilation: Mask ventilation without difficulty and Oral airway inserted - appropriate to patient size Laryngoscope Size: Glidescope and 3 Grade View: Grade II Tube type: Oral Tube size: 7.0 mm Number of attempts: 1 Airway Equipment and Method: Video-laryngoscopy and Stylet Placement Confirmation: ETT inserted through vocal cords under direct vision,  positive ETCO2 and breath sounds checked- equal and bilateral Secured at: 21 cm Tube secured with: Tape Dental Injury: Teeth and Oropharynx as per pre-operative assessment  Difficulty Due To: Difficulty was anticipated and Difficult Airway- due to limited oral opening Comments: Induced with propofol/ supplemented with gas once patient asleep.  Determined we could ventilate patient adequately with oral airway.  Short acting muscle relaxation given and patient deepened.  DL x1 with glidescope, able to visualize cords.  ETT passed easily. +BBS +ETC02.  Patient unable to open mouth any wider after induction than before.

## 2018-09-04 ENCOUNTER — Encounter (HOSPITAL_COMMUNITY): Payer: Self-pay | Admitting: Otolaryngology

## 2018-09-04 DIAGNOSIS — M8618 Other acute osteomyelitis, other site: Secondary | ICD-10-CM

## 2018-09-04 LAB — GLUCOSE, CAPILLARY
Glucose-Capillary: 153 mg/dL — ABNORMAL HIGH (ref 70–99)
Glucose-Capillary: 186 mg/dL — ABNORMAL HIGH (ref 70–99)
Glucose-Capillary: 193 mg/dL — ABNORMAL HIGH (ref 70–99)
Glucose-Capillary: 198 mg/dL — ABNORMAL HIGH (ref 70–99)
Glucose-Capillary: 213 mg/dL — ABNORMAL HIGH (ref 70–99)
Glucose-Capillary: 266 mg/dL — ABNORMAL HIGH (ref 70–99)

## 2018-09-04 LAB — CBC
HCT: 39.5 % (ref 36.0–46.0)
Hemoglobin: 12.3 g/dL (ref 12.0–15.0)
MCH: 29.7 pg (ref 26.0–34.0)
MCHC: 31.1 g/dL (ref 30.0–36.0)
MCV: 95.4 fL (ref 80.0–100.0)
Platelets: 378 10*3/uL (ref 150–400)
RBC: 4.14 MIL/uL (ref 3.87–5.11)
RDW: 13.6 % (ref 11.5–15.5)
WBC: 17.4 10*3/uL — ABNORMAL HIGH (ref 4.0–10.5)
nRBC: 0 % (ref 0.0–0.2)

## 2018-09-04 LAB — C-REACTIVE PROTEIN: CRP: 15.2 mg/dL — ABNORMAL HIGH (ref <1.0)

## 2018-09-04 LAB — SEDIMENTATION RATE: Sed Rate: 62 mm/hr — ABNORMAL HIGH (ref 0–22)

## 2018-09-04 MED ORDER — INSULIN GLARGINE 100 UNIT/ML ~~LOC~~ SOLN
6.0000 [IU] | Freq: Every day | SUBCUTANEOUS | Status: DC
  Administered 2018-09-04 – 2018-09-09 (×6): 6 [IU] via SUBCUTANEOUS
  Filled 2018-09-04 (×6): qty 0.06

## 2018-09-04 MED ORDER — TRAMADOL HCL 50 MG PO TABS
50.0000 mg | ORAL_TABLET | Freq: Four times a day (QID) | ORAL | Status: DC | PRN
  Administered 2018-09-04 – 2018-09-07 (×8): 50 mg via ORAL
  Filled 2018-09-04 (×9): qty 1

## 2018-09-04 NOTE — Plan of Care (Signed)
  Problem: Clinical Measurements: Goal: Will remain free from infection Outcome: Progressing   Problem: Pain Managment: Goal: General experience of comfort will improve Outcome: Progressing   

## 2018-09-04 NOTE — Progress Notes (Signed)
   Subjective:    Patient ID: Adrienne [name], female    DOB: May 08, 1955, 63 y.o.   MRN: [id number]  HPI Feels a little better but still having pretty bad pain that comes and goes.  Review of Systems     Objective:   Physical Exam AF VSS WBC 17.4 Alert, NAD Right neck dressing changed.  Penrose drain in place, mostly bloody drainage.  Continued right submandibular and submental firm edema.    Assessment & Plan:  Right submandibular odontogenic abscess s/p I&D  Expect slow progress.  Culture pending.  Penrose drain in place.  Will leave Penrose in place for 3-5 days.  In surgery, she had some exposed mandibular bone at the right inferior alveolar ridge where teeth had been extracted.   There was mention of potential osteomyelitis by radiology on her neck CT.  Recommend Infectious Disease consultation to consider long-term IV antibiotic therapy considering her diabetes and joint replacements.

## 2018-09-04 NOTE — Op Note (Signed)
NAME: Adrienne [name], Adrienne [name] MEDICAL RECORD EL:[id number] ACCOUNT [id number] DATE OF BIRTH:17-Apr-1955 FACILITY: [facility] LOCATION: [facility] PHYSICIAN:Albert Devaul DRedmond Baseman, MD  OPERATIVE REPORT  DATE OF PROCEDURE:  09/03/2018  PREOPERATIVE DIAGNOSIS:  Right deep space neck abscess.  POSTOPERATIVE DIAGNOSIS:  Right deep space neck abscess.  PROCEDURE:  Incision and drainage of right deep space neck abscess.  SURGEON:  Melida Quitter, MD  ANESTHESIA:  General endotracheal anesthesia.  COMPLICATIONS:  None.  INDICATIONS:  The patient is a 62 year old female who underwent dental extractions from the right mandible recently.  She has had increasing swelling of the neck since then and presented to the emergency department for evaluation.  She was found by CT  scan to have soft tissue abscess in the right submandibular space and presented to the operating room for surgical management.  FINDINGS:  Pus was indeed encountered in the right submandibular space.  Cultures were sent.  DESCRIPTION OF PROCEDURE:  The patient was identified in the holding room, informed consent having been obtained with discussion of risks, benefits and alternatives.  The patient was brought to the operative suite and put on the operative table in supine  position.  Anesthesia was induced, and the patient was intubated by the anesthesia team using a GlideScope without difficulty.  The patient had received intravenous antibiotics prior to surgery.  The shoulder roll was placed and the eyes were taped  closed.  The right neck incision was marked with a marking pen and injected with 1% lidocaine with 1:100,000 epinephrine.  The right neck was prepped and draped in sterile fashion.  An incision was made with a 15-blade scalpel and extended through the  subcutaneous and platysmal layers using Bovie electrocautery.  Dissection was then performed deeply using Bovie electrocautery and then blunt dissection until the abscess cavity was encountered  as was obvious when pus began to drain.  Cultures were  taken.  The cavity was then further dissected with finger dissection in order to divide all septations and allow drainage.  The cavity was then copiously irrigated with saline using a red rubber catheter.  A 1/4-inch Penrose drain was then placed in the  depth of the abscess and secured to the skin using 2-0 nylon suture.  The platysmal layer was then closed with 3-0 Vicryl suture in a simple interrupted fashion.  The skin was closed with staples.  Drapes were removed.  The patient was cleaned off.  A  Kerlix fluff dressing was placed around the neck.  She was then returned to anesthesia for wakeup and was extubated and taken to the recovery room in stable condition.  LN/NUANCE  D:09/03/2018 T:09/04/2018 JOB:[id number]

## 2018-09-04 NOTE — Progress Notes (Signed)
PROGRESS NOTE  Adrienne [name] FBP:[id number] DOB: Feb 06, 1956 DOA: 09/02/2018 PCP: Asencion Noble, MD   LOS: 2 days   Patient is from: Home.  Brief Narrative / Interim history: 63 year old female with COPD, DM-2, HLD, bipolar disorder, anxiety, depression and ADD?[name]  She presented to [facility] on 6/26 with right jaw pain and swelling after recent dental extraction on 6/22.  CT showed right jaw and neck abscess and cellulitis.  Failed outpatient treatment with clindamycin.  Transferred to [facility] for ENT evaluation and care.  Patient had I&D and Penrose drain placement by doctors on 6/27.  Fluid Gram stain with GPC's in pairs  Subjective: No major events overnight of this point.  She had I&D with Penrose drain placement yesterday.  Still with some pain in her right jaw and right neck.  Difficulty opening her mouth much.  Limited motion in her neck due to pain.  No other complaints.  Denies chest pain or dyspnea.  Objective: Vitals:   09/03/18 2315 09/04/18 0441 09/04/18 0825 09/04/18 1123  BP: 115/60 117/68 102/68   Pulse: 69 63 65   Resp: 13 12    Temp:  97.6 F (36.4 C) 97.9 F (36.6 C) 98.7 F (37.1 C)  TempSrc:  Oral Oral Oral  SpO2: 92% 93%    Weight:      Height:        Intake/Output Summary (Last 24 hours) at 09/04/2018 1546 Last data filed at 09/04/2018 1130 Gross per 24 hour  Intake 1515.1 ml  Output 905 ml  Net 610.1 ml   Filed Weights   09/02/18 1131  Weight: 75.8 kg    Examination:  GENERAL: No acute distress.  Appears well.  HEENT: MMM.  Vision and hearing grossly intact.  Mild swelling and tenderness over right face and neck.  NECK: Mild swelling and tenderness.  Slightly soaked dressing with blood but no active bleeding.  Penrose drain in place.  Limited motion in the neck due to pain. LUNGS:  No IWOB. Good air movement bilaterally. HEART:  RRR. Heart sounds normal.  ABD: Bowel sounds present. Soft. Non tender.  MSK/EXT:  Moves all extremities. No  apparent deformity. No edema bilaterally.  SKIN: no apparent skin lesion or wound NEURO: Awake, alert and oriented appropriately.  No gross deficit.  PSYCH: Calm. Normal affect.  I have personally reviewed the following labs and images: CBC: Recent Labs  Lab 09/02/18 1356 09/03/18 0816 09/04/18 0424  WBC 24.2* 16.7* 17.4*  NEUTROABS 20.1*  --   --   HGB 13.7 12.2 12.3  HCT 42.3 37.4 39.5  MCV 92.0 92.8 95.4  PLT 364 336 824   Basic Metabolic Panel: Recent Labs  Lab 09/02/18 1356  NA 132*  K 4.2  CL 92*  CO2 28  GLUCOSE 300*  BUN 28*  CREATININE 0.96  CALCIUM 9.3   GFR: Estimated Creatinine Clearance: 57.8 mL/min (by C-G formula based on SCr of 0.96 mg/dL). Liver Function Tests: No results for input(s): AST, ALT, ALKPHOS, BILITOT, PROT, ALBUMIN in the last 168 hours. No results for input(s): LIPASE, AMYLASE in the last 168 hours. No results for input(s): AMMONIA in the last 168 hours. Coagulation Profile: No results for input(s): INR, PROTIME in the last 168 hours. Cardiac Enzymes: No results for input(s): CKTOTAL, CKMB, CKMBINDEX, TROPONINI in the last 168 hours. BNP (last 3 results) No results for input(s): PROBNP in the last 8760 hours. HbA1C: Recent Labs    09/02/18 1742  HGBA1C 8.8*  CBG: Recent Labs  Lab 09/03/18 2301 09/04/18 0027 09/04/18 0440 09/04/18 0824 09/04/18 1117  GLUCAP 328* 266* 198* 153* 186*   Lipid Profile: No results for input(s): CHOL, HDL, LDLCALC, TRIG, CHOLHDL, LDLDIRECT in the last 72 hours. Thyroid Function Tests: No results for input(s): TSH, T4TOTAL, FREET4, T3FREE, THYROIDAB in the last 72 hours. Anemia Panel: No results for input(s): VITAMINB12, FOLATE, FERRITIN, TIBC, IRON, RETICCTPCT in the last 72 hours. Urine analysis:    Component Value Date/Time   COLORURINE YELLOW 09/02/2018 1151   APPEARANCEUR HAZY (A) 09/02/2018 1151   LABSPEC >1.046 (H) 09/02/2018 1151   PHURINE 5.0 09/02/2018 1151   GLUCOSEU 150 (A)  09/02/2018 1151   HGBUR SMALL (A) 09/02/2018 1151   BILIRUBINUR NEGATIVE 09/02/2018 1151   KETONESUR NEGATIVE 09/02/2018 1151   PROTEINUR 30 (A) 09/02/2018 1151   UROBILINOGEN 0.2 12/16/2007 1404   NITRITE NEGATIVE 09/02/2018 1151   LEUKOCYTESUR NEGATIVE 09/02/2018 1151   Sepsis Labs: Invalid input(s): PROCALCITONIN, LACTICIDVEN  Recent Results (from the past 240 hour(s))  Blood Culture (routine x 2)     Status: None (Preliminary result)   Collection Time: 09/02/18 12:15 PM   Specimen: BLOOD LEFT ARM  Result Value Ref Range Status   Specimen Description BLOOD LEFT ARM  Final   Special Requests   Final    BOTTLES DRAWN AEROBIC AND ANAEROBIC Blood Culture adequate volume   Culture   Final    NO GROWTH 2 DAYS Performed at [facility], [address] [place], [place] [address]    Report Status PENDING  Incomplete  SARS Coronavirus 2 (CEPHEID - Performed in [place] hospital lab), Hosp Order     Status: None   Collection Time: 09/02/18 12:16 PM   Specimen: Nasopharyngeal Swab  Result Value Ref Range Status   SARS Coronavirus 2 NEGATIVE NEGATIVE Final    Comment: (NOTE) If result is NEGATIVE SARS-CoV-2 target nucleic acids are NOT DETECTED. The SARS-CoV-2 RNA is generally detectable in upper and lower  respiratory specimens during the acute phase of infection. The lowest  concentration of SARS-CoV-2 viral copies this assay can detect is 250  copies / mL. A negative result does not preclude SARS-CoV-2 infection  and should not be used as the sole basis for treatment or other  patient management decisions.  A negative result may occur with  improper specimen collection / handling, submission of specimen other  than nasopharyngeal swab, presence of viral mutation(s) within the  areas targeted by this assay, and inadequate number of viral copies  (<250 copies / mL). A negative result must be combined with clinical  observations, patient history, and epidemiological information.  If result is POSITIVE SARS-CoV-2 target nucleic acids are DETECTED. The SARS-CoV-2 RNA is generally detectable in upper and lower  respiratory specimens dur ing the acute phase of infection.  Positive  results are indicative of active infection with SARS-CoV-2.  Clinical  correlation with patient history and other diagnostic information is  necessary to determine patient infection status.  Positive results do  not rule out bacterial infection or co-infection with other viruses. If result is PRESUMPTIVE POSTIVE SARS-CoV-2 nucleic acids MAY BE PRESENT.   A presumptive positive result was obtained on the submitted specimen  and confirmed on repeat testing.  While 2019 novel coronavirus  (SARS-CoV-2) nucleic acids may be present in the submitted sample  additional confirmatory testing may be necessary for epidemiological  and / or clinical management purposes  to differentiate between  SARS-CoV-2 and other Sarbecovirus  currently known to infect humans.  If clinically indicated additional testing with an alternate test  methodology [phone]) is advised. The SARS-CoV-2 RNA is generally  detectable in upper and lower respiratory sp ecimens during the acute  phase of infection. The expected result is Negative. Fact Sheet for Patients:  StrictlyIdeas.no Fact Sheet for Healthcare Providers: BankingDealers.co.za This test is not yet approved or cleared by the [place] FDA and has been authorized for detection and/or diagnosis of SARS-CoV-2 by FDA under an Emergency Use Authorization (EUA).  This EUA will remain in effect (meaning this test can be used) for the duration of the COVID-19 declaration under Section 564(b)(1) of the Act, 21 U.S.C. section 360bbb-3(b)(1), unless the authorization is terminated or revoked sooner. Performed at [facility], [address], [place], [place] [address]   Blood Culture (routine x 2)     Status: None  (Preliminary result)   Collection Time: 09/02/18 12:23 PM   Specimen: BLOOD RIGHT ARM  Result Value Ref Range Status   Specimen Description BLOOD RIGHT ARM  Final   Special Requests   Final    BOTTLES DRAWN AEROBIC AND ANAEROBIC Blood Culture results may not be optimal due to an excessive volume of blood received in culture bottles   Culture   Final    NO GROWTH 2 DAYS Performed at [facility], [address] [place], [place] [address]    Report Status PENDING  Incomplete  Aerobic/Anaerobic Culture (surgical/deep wound)     Status: None (Preliminary result)   Collection Time: 09/03/18  4:37 PM   Specimen: Abscess  Result Value Ref Range Status   Specimen Description ABSCESS RIGHT NECK  Final   Special Requests PATIENT ON FOLLOWING CLEOCIN AZACTAM LEVAQUIN  Final   Gram Stain   Final    RARE WBC PRESENT, PREDOMINANTLY PMN FEW GRAM POSITIVE COCCI IN PAIRS    Culture   Final    CULTURE REINCUBATED FOR BETTER GROWTH Performed at [facility], [place] [address], [place], [place] [address]    Report Status PENDING  Incomplete     Radiology Studies: Dg Chest Port 1 View  Result Date: 09/04/2018 CLINICAL DATA:  Shortness of breath EXAM: PORTABLE CHEST 1 VIEW COMPARISON:  Chest CT 05/24/2014 FINDINGS: The heart size and mediastinal contours are within normal limits. Both lungs are clear. The visualized skeletal structures are unremarkable. IMPRESSION: No active disease. Electronically Signed   By: Ulyses Jarred M.D.   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 clindamycin. -I&D and Penrose drain placement on 6/27 by Dr. Redmond Baseman. -Fluid Gram stain with GPC's in pairs. -Blood cultures negative. -Continue IV Levaquin and IV clindamycin -PRN fentanyl for pain -We will consult ID about  long-term IV antibiotic for possible osteomyelitis.  Leukocytosis: Slightly elevated but stable.  Partly due to marginalization from surgical stress. -Continue monitoring.  Lactic acidosis: Likely due to #1.  Resolved after IV fluid.  Poorly controlled NIDDM-2 with hyperglycemia: A1c 8.8%.  On metformin and glimepiride at home.  CBG elevated. -Add 6 units of Lantus -Sliding scale insulin and CBG monitoring -Start statin.  Bipolar disorder type I/anxiety/depression: Stable -Closely monitor on Levaquin. -Continue home lithium, Xanax, Effexor and Ritalin  DVT prophylaxis: Start subcu Lovenox. Code Status: Full code Family Communication: Patient would like to update her family after surgery and let me know  if any question. Disposition Plan: Remains inpatient on IV antibiotics. Consultants: ENT  Antimicrobials: Anti-infectives (From admission, onward)   Start     Dose/Rate Route Frequency Ordered Stop   09/02/18 2200  aztreonam (AZACTAM) 2 g in sodium chloride 0.9 % 100 mL IVPB  Status:  Discontinued     2 g 200 mL/hr over 30 Minutes Intravenous Every 8 hours 09/02/18 1536 09/02/18 1737   09/02/18 1745  clindamycin (CLEOCIN) IVPB 600 mg     600 mg 100 mL/hr over 30 Minutes Intravenous Every 8 hours 09/02/18 1737     09/02/18 1745  levofloxacin (LEVAQUIN) IVPB 750 mg     750 mg 100 mL/hr over 90 Minutes Intravenous Every 24 hours 09/02/18 1737     09/02/18 1530  aztreonam (AZACTAM) 2 g in sodium chloride 0.9 % 100 mL IVPB     2 g 200 mL/hr over 30 Minutes Intravenous  Once 09/02/18 1524 09/02/18 1808   09/02/18 1200  clindamycin (CLEOCIN) IVPB 900 mg     900 mg 100 mL/hr over 30 Minutes Intravenous  Once 09/02/18 1152 09/02/18 1400      Scheduled Meds: . insulin aspart  0-15 Units Subcutaneous Q4H  . insulin glargine  6 Units Subcutaneous Daily  . lithium carbonate  600 mg Oral QHS  . loratadine  10 mg Oral Daily  . methylphenidate  20 mg Oral TID WC  . pantoprazole  40 mg  Oral Daily  . rosuvastatin  20 mg Oral Daily  . venlafaxine XR  150 mg Oral Q breakfast   Continuous Infusions: . clindamycin (CLEOCIN) IV 600 mg (09/04/18 0859)  . lactated ringers 10 mL/hr at 09/03/18 1854  . levofloxacin (LEVAQUIN) IV Stopped (09/02/18 2108)   PRN Meds:.acetaminophen **OR** acetaminophen, albuterol, ALPRAZolam, diphenhydrAMINE **OR** diphenhydrAMINE, ondansetron **OR** ondansetron (ZOFRAN) IV, traMADol   Samanthan Dugo T. [place]  If 7PM-7AM, please contact night-coverage [email or web address] Password [facility] 09/04/2018, 3:46 PM

## 2018-09-05 DIAGNOSIS — F17210 Nicotine dependence, cigarettes, uncomplicated: Secondary | ICD-10-CM

## 2018-09-05 DIAGNOSIS — Z885 Allergy status to narcotic agent status: Secondary | ICD-10-CM

## 2018-09-05 DIAGNOSIS — F329 Major depressive disorder, single episode, unspecified: Secondary | ICD-10-CM

## 2018-09-05 DIAGNOSIS — J439 Emphysema, unspecified: Secondary | ICD-10-CM

## 2018-09-05 DIAGNOSIS — Z978 Presence of other specified devices: Secondary | ICD-10-CM

## 2018-09-05 DIAGNOSIS — E119 Type 2 diabetes mellitus without complications: Secondary | ICD-10-CM

## 2018-09-05 DIAGNOSIS — G40909 Epilepsy, unspecified, not intractable, without status epilepticus: Secondary | ICD-10-CM

## 2018-09-05 DIAGNOSIS — G4730 Sleep apnea, unspecified: Secondary | ICD-10-CM

## 2018-09-05 DIAGNOSIS — K122 Cellulitis and abscess of mouth: Secondary | ICD-10-CM

## 2018-09-05 DIAGNOSIS — J45909 Unspecified asthma, uncomplicated: Secondary | ICD-10-CM

## 2018-09-05 DIAGNOSIS — L0211 Cutaneous abscess of neck: Secondary | ICD-10-CM

## 2018-09-05 DIAGNOSIS — Z880 Allergy status to penicillin: Secondary | ICD-10-CM

## 2018-09-05 DIAGNOSIS — I10 Essential (primary) hypertension: Secondary | ICD-10-CM

## 2018-09-05 DIAGNOSIS — H3530 Unspecified macular degeneration: Secondary | ICD-10-CM

## 2018-09-05 DIAGNOSIS — Z882 Allergy status to sulfonamides status: Secondary | ICD-10-CM

## 2018-09-05 DIAGNOSIS — M272 Inflammatory conditions of jaws: Secondary | ICD-10-CM

## 2018-09-05 LAB — CBC
HCT: 38.3 % (ref 36.0–46.0)
Hemoglobin: 12.6 g/dL (ref 12.0–15.0)
MCH: 31 pg (ref 26.0–34.0)
MCHC: 32.9 g/dL (ref 30.0–36.0)
MCV: 94.1 fL (ref 80.0–100.0)
Platelets: 417 10*3/uL — ABNORMAL HIGH (ref 150–400)
RBC: 4.07 MIL/uL (ref 3.87–5.11)
RDW: 13.5 % (ref 11.5–15.5)
WBC: 14.5 10*3/uL — ABNORMAL HIGH (ref 4.0–10.5)
nRBC: 0 % (ref 0.0–0.2)

## 2018-09-05 LAB — BASIC METABOLIC PANEL
Anion gap: 11 (ref 5–15)
BUN: 15 mg/dL (ref 8–23)
CO2: 27 mmol/L (ref 22–32)
Calcium: 8.7 mg/dL — ABNORMAL LOW (ref 8.9–10.3)
Chloride: 99 mmol/L (ref 98–111)
Creatinine, Ser: 0.85 mg/dL (ref 0.44–1.00)
GFR calc Af Amer: >60 mL/min (ref >60)
GFR calc non Af Amer: >60 mL/min (ref >60)
Glucose, Bld: 99 mg/dL (ref 70–99)
Potassium: 3.9 mmol/L (ref 3.5–5.1)
Sodium: 137 mmol/L (ref 135–145)

## 2018-09-05 LAB — GLUCOSE, CAPILLARY
Glucose-Capillary: 104 mg/dL — ABNORMAL HIGH (ref 70–99)
Glucose-Capillary: 114 mg/dL — ABNORMAL HIGH (ref 70–99)
Glucose-Capillary: 121 mg/dL — ABNORMAL HIGH (ref 70–99)
Glucose-Capillary: 122 mg/dL — ABNORMAL HIGH (ref 70–99)
Glucose-Capillary: 160 mg/dL — ABNORMAL HIGH (ref 70–99)
Glucose-Capillary: 183 mg/dL — ABNORMAL HIGH (ref 70–99)
Glucose-Capillary: 229 mg/dL — ABNORMAL HIGH (ref 70–99)

## 2018-09-05 MED ORDER — SODIUM CHLORIDE 0.9 % IV SOLN
3.0000 g | Freq: Four times a day (QID) | INTRAVENOUS | Status: DC
  Administered 2018-09-05 – 2018-09-06 (×5): 3 g via INTRAVENOUS
  Filled 2018-09-05 (×8): qty 3

## 2018-09-05 NOTE — Progress Notes (Signed)
PROGRESS NOTE  Adrienne [name] URK:[id number] DOB: 11-23-1955 DOA: 09/02/2018 PCP: Asencion Noble, MD   LOS: 3 days   Patient is from: Home.  Brief Narrative / Interim history: 63 year old female with COPD, DM-2, HLD, bipolar disorder, anxiety, depression and ADD?[name]  She presented to [facility] on 6/26 with right jaw pain and swelling after recent dental extraction on 6/22.  CT showed right jaw and neck abscess and cellulitis.  Failed outpatient treatment with clindamycin.  Transferred to [facility] for ENT evaluation and care.  Patient had I&D and Penrose drain placement by doctors on 6/27.  Blood cultures negative.  Fluid Gram stain with GPC's in pairs.  Wound culture pending.  Subjective: No major events overnight of this morning.  Reports lack of sleep due to pain.  Reports pain in her right jaw and neck.  Also complains headache.  Rates her pain 7/10.  Denies chest pain, dyspnea, GI or GU symptoms.  Objective: Vitals:   09/04/18 1559 09/04/18 2053 09/04/18 2300 09/05/18 0444  BP: (!) 145/69 140/86 (!) 145/66 125/80  Pulse: (!) 45 71 71 (!) 59  Resp: 20 18 15 18   Temp:  98.1 F (36.7 C) 98.1 F (36.7 C) 97.9 F (36.6 C)  TempSrc:  Oral Oral Oral  SpO2: 93% 98% (!) 88% 92%  Weight:    81.1 kg  Height:        Intake/Output Summary (Last 24 hours) at 09/05/2018 1327 Last data filed at 09/05/2018 1229 Gross per 24 hour  Intake 922 ml  Output 1200 ml  Net -278 ml   Filed Weights   09/02/18 1131 09/05/18 0444  Weight: 75.8 kg 81.1 kg    Examination:  GENERAL: No acute distress.  Appears well.  HEENT: MMM.  Vision and hearing grossly intact.  Mild swelling and tender over right face and neck. NECK: Mild swelling and tenderness on the right.  Penrose drain in place.  Appears to be draining. LUNGS:  No IWOB. Good air movement bilaterally. HEART:  RRR. Heart sounds normal.  No stigmata of endocarditis. ABD: Bowel sounds present. Soft. Non tender.  MSK/EXT:  Moves all  extremities. No apparent deformity. No edema bilaterally.  SKIN: Surgical wound in right neck. NEURO: Awake, alert and oriented appropriately.  No gross deficit.  PSYCH: Calm. Normal affect.  I have personally reviewed the following labs and images: CBC: Recent Labs  Lab 09/02/18 1356 09/03/18 0816 09/04/18 0424 09/05/18 0343  WBC 24.2* 16.7* 17.4* 14.5*  NEUTROABS 20.1*  --   --   --   HGB 13.7 12.2 12.3 12.6  HCT 42.3 37.4 39.5 38.3  MCV 92.0 92.8 95.4 94.1  PLT 364 336 378 831*   Basic Metabolic Panel: Recent Labs  Lab 09/02/18 1356 09/05/18 0343  NA 132* 137  K 4.2 3.9  CL 92* 99  CO2 28 27  GLUCOSE 300* 99  BUN 28* 15  CREATININE 0.96 0.85  CALCIUM 9.3 8.7*   GFR: Estimated Creatinine Clearance: 67.5 mL/min (by C-G formula based on SCr of 0.85 mg/dL). Liver Function Tests: No results for input(s): AST, ALT, ALKPHOS, BILITOT, PROT, ALBUMIN in the last 168 hours. No results for input(s): LIPASE, AMYLASE in the last 168 hours. No results for input(s): AMMONIA in the last 168 hours. Coagulation Profile: No results for input(s): INR, PROTIME in the last 168 hours. Cardiac Enzymes: No results for input(s): CKTOTAL, CKMB, CKMBINDEX, TROPONINI in the last 168 hours. BNP (last 3 results) No results for input(s):  PROBNP in the last 8760 hours. HbA1C: Recent Labs    09/02/18 1742  HGBA1C 8.8*   CBG: Recent Labs  Lab 09/04/18 2051 09/05/18 0017 09/05/18 0447 09/05/18 0809 09/05/18 1152  GLUCAP 193* 160* 104* 121* 229*   Lipid Profile: No results for input(s): CHOL, HDL, LDLCALC, TRIG, CHOLHDL, LDLDIRECT in the last 72 hours. Thyroid Function Tests: No results for input(s): TSH, T4TOTAL, FREET4, T3FREE, THYROIDAB in the last 72 hours. Anemia Panel: No results for input(s): VITAMINB12, FOLATE, FERRITIN, TIBC, IRON, RETICCTPCT in the last 72 hours. Urine analysis:    Component Value Date/Time   COLORURINE YELLOW 09/02/2018 1151   APPEARANCEUR HAZY (A)  09/02/2018 1151   LABSPEC >1.046 (H) 09/02/2018 1151   PHURINE 5.0 09/02/2018 1151   GLUCOSEU 150 (A) 09/02/2018 1151   HGBUR SMALL (A) 09/02/2018 1151   BILIRUBINUR NEGATIVE 09/02/2018 1151   KETONESUR NEGATIVE 09/02/2018 1151   PROTEINUR 30 (A) 09/02/2018 1151   UROBILINOGEN 0.2 12/16/2007 1404   NITRITE NEGATIVE 09/02/2018 1151   LEUKOCYTESUR NEGATIVE 09/02/2018 1151   Sepsis Labs: Invalid input(s): PROCALCITONIN, LACTICIDVEN  Recent Results (from the past 240 hour(s))  Blood Culture (routine x 2)     Status: None (Preliminary result)   Collection Time: 09/02/18 12:15 PM   Specimen: BLOOD LEFT ARM  Result Value Ref Range Status   Specimen Description BLOOD LEFT ARM  Final   Special Requests   Final    BOTTLES DRAWN AEROBIC AND ANAEROBIC Blood Culture adequate volume   Culture   Final    NO GROWTH 3 DAYS Performed at [facility], [address], [place], [place] [address]    Report Status PENDING  Incomplete  SARS Coronavirus 2 (CEPHEID - Performed in [place] hospital lab), Hosp Order     Status: None   Collection Time: 09/02/18 12:16 PM   Specimen: Nasopharyngeal Swab  Result Value Ref Range Status   SARS Coronavirus 2 NEGATIVE NEGATIVE Final    Comment: (NOTE) If result is NEGATIVE SARS-CoV-2 target nucleic acids are NOT DETECTED. The SARS-CoV-2 RNA is generally detectable in upper and lower  respiratory specimens during the acute phase of infection. The lowest  concentration of SARS-CoV-2 viral copies this assay can detect is 250  copies / mL. A negative result does not preclude SARS-CoV-2 infection  and should not be used as the sole basis for treatment or other  patient management decisions.  A negative result may occur with  improper specimen collection / handling, submission of specimen other  than nasopharyngeal swab, presence of viral mutation(s) within the  areas targeted by this assay, and inadequate number of viral copies  (<250 copies / mL). A negative  result must be combined with clinical  observations, patient history, and epidemiological information. If result is POSITIVE SARS-CoV-2 target nucleic acids are DETECTED. The SARS-CoV-2 RNA is generally detectable in upper and lower  respiratory specimens dur ing the acute phase of infection.  Positive  results are indicative of active infection with SARS-CoV-2.  Clinical  correlation with patient history and other diagnostic information is  necessary to determine patient infection status.  Positive results do  not rule out bacterial infection or co-infection with other viruses. If result is PRESUMPTIVE POSTIVE SARS-CoV-2 nucleic acids MAY BE PRESENT.   A presumptive positive result was obtained on the submitted specimen  and confirmed on repeat testing.  While 2019 novel coronavirus  (SARS-CoV-2) nucleic acids may be present in the submitted sample  additional confirmatory testing may be  necessary for epidemiological  and / or clinical management purposes  to differentiate between  SARS-CoV-2 and other Sarbecovirus currently known to infect humans.  If clinically indicated additional testing with an alternate test  methodology [phone]) is advised. The SARS-CoV-2 RNA is generally  detectable in upper and lower respiratory sp ecimens during the acute  phase of infection. The expected result is Negative. Fact Sheet for Patients:  StrictlyIdeas.no Fact Sheet for Healthcare Providers: BankingDealers.co.za This test is not yet approved or cleared by the [place] FDA and has been authorized for detection and/or diagnosis of SARS-CoV-2 by FDA under an Emergency Use Authorization (EUA).  This EUA will remain in effect (meaning this test can be used) for the duration of the COVID-19 declaration under Section 564(b)(1) of the Act, 21 U.S.C. section 360bbb-3(b)(1), unless the authorization is terminated or revoked sooner. Performed at [facility], [address], [place], [place] [address]   Blood Culture (routine x 2)     Status: None (Preliminary result)   Collection Time: 09/02/18 12:23 PM   Specimen: BLOOD RIGHT ARM  Result Value Ref Range Status   Specimen Description BLOOD RIGHT ARM  Final   Special Requests   Final    BOTTLES DRAWN AEROBIC AND ANAEROBIC Blood Culture results may not be optimal due to an excessive volume of blood received in culture bottles   Culture   Final    NO GROWTH 3 DAYS Performed at [facility], [address], [place], [place] [address]    Report Status PENDING  Incomplete  Aerobic/Anaerobic Culture (surgical/deep wound)     Status: None (Preliminary result)   Collection Time: 09/03/18  4:37 PM   Specimen: Abscess  Result Value Ref Range Status   Specimen Description ABSCESS RIGHT NECK  Final   Special Requests PATIENT ON FOLLOWING CLEOCIN AZACTAM LEVAQUIN  Final   Gram Stain   Final    RARE WBC PRESENT, PREDOMINANTLY PMN FEW GRAM POSITIVE COCCI IN PAIRS    Culture   Final    CULTURE REINCUBATED FOR BETTER GROWTH Performed at [facility], [place] [address], [place], [place] [address]    Report Status PENDING  Incomplete     Radiology Studies: No results found.   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 clindamycin. -I&D and Penrose drain placement on 6/27 by Dr. Redmond Baseman. -Fluid Gram stain with GPC's in pairs.  Culture pending. -Blood cultures negative. -IV Levaquin and IV clindamycin 6/26-6/29 -IV Unasyn 6/29-- -PRN fentanyl for pain -Appreciate care by ENT and ID.  Leukocytosis: Likely due to the above.  Improving. -Continue monitoring.  Lactic acidosis: Likely due to #1.  Resolved after IV fluid.  Poorly controlled NIDDM-2 with hyperglycemia: A1c 8.8%.  On metformin and glimepiride at home.   -Continue Lantus at 60 units. -SSI-moderate. -Continue statin  Bipolar disorder type I/anxiety/depression: Stable -Continue home lithium, Xanax, Effexor and Ritalin  DVT prophylaxis: Start subcu Lovenox. Code Status: Full code Family Communication: Patient would like to update her family after surgery and let me know if any question. Disposition Plan: Remains inpatient on IV antibiotics. Consultants: ENT and ID  Antimicrobials: Anti-infectives (From admission, onward)   Start     Dose/Rate Route Frequency Ordered Stop   09/05/18 1200  Ampicillin-Sulbactam (UNASYN) 3 g in sodium chloride 0.9 % 100 mL IVPB     3 g 200 mL/hr over 30  Minutes Intravenous Every 6 hours 09/05/18 1109     09/02/18 2200  aztreonam (AZACTAM) 2 g in sodium chloride 0.9 % 100 mL IVPB  Status:  Discontinued     2 g 200 mL/hr over 30 Minutes Intravenous Every 8 hours 09/02/18 1536 09/02/18 1737   09/02/18 1745  clindamycin (CLEOCIN) IVPB 600 mg  Status:  Discontinued     600 mg 100 mL/hr over 30 Minutes Intravenous Every 8 hours 09/02/18 1737 09/05/18 1109   09/02/18 1745  levofloxacin (LEVAQUIN) IVPB 750 mg  Status:  Discontinued     750 mg 100 mL/hr over 90 Minutes Intravenous Every 24 hours 09/02/18 1737 09/05/18 0951   09/02/18 1530  aztreonam (AZACTAM) 2 g in sodium chloride 0.9 % 100 mL IVPB     2 g 200 mL/hr over 30 Minutes Intravenous  Once 09/02/18 1524 09/02/18 1808   09/02/18 1200  clindamycin (CLEOCIN) IVPB 900 mg     900 mg 100 mL/hr over 30 Minutes Intravenous  Once 09/02/18 1152 09/02/18 1400      Scheduled Meds: . insulin aspart  0-15 Units Subcutaneous Q4H  . insulin glargine  6 Units Subcutaneous Daily  . lithium carbonate  600 mg Oral QHS  . loratadine  10 mg Oral Daily  . methylphenidate  20 mg Oral TID WC  . pantoprazole  40 mg Oral Daily  . rosuvastatin  20 mg Oral Daily  . venlafaxine XR  150 mg Oral Q breakfast   Continuous Infusions: . ampicillin-sulbactam (UNASYN) IV 3 g  (09/05/18 1207)  . lactated ringers 10 mL/hr at 09/03/18 1854   PRN Meds:.acetaminophen **OR** acetaminophen, albuterol, ALPRAZolam, diphenhydrAMINE **OR** diphenhydrAMINE, ondansetron **OR** ondansetron (ZOFRAN) IV, traMADol   Adrienne [name] T. Dunlap  If 7PM-7AM, please contact night-coverage [email or web address] Password [email or web address] 09/05/2018, 1:27 PM

## 2018-09-05 NOTE — Progress Notes (Signed)
Pt called out for assistance, and this RN noted pt to be standing in a puddle of blood. Pt accidentally removed IV while trying to give herself a bath. Pt currently a/o X 4, showing no signs of complaint, VSS. RN to continue to monitor.

## 2018-09-05 NOTE — Consult Note (Signed)
[facility] for Infectious Disease    Date of Admission:  09/02/2018     Total days of antibiotics                Reason for Consult: Submandibular abscess   Referring Provider: Cyndia Skeeters Primary Care Provider: Asencion Noble, MD   Assessment/Plan:  Ms. Funaro is a 63 year old female with multiple medical problems admitted with fever and trismus found to have an abscess of the right space requiring incision and drainage with pus encountered during surgery and cultures pending and Gram stain showing gram-positive cocci.  Current antimicrobial therapy is clindamycin and levofloxacin.  Right deep space neck abscess -discontinue levofloxacin and clindamycin cultures remain pending.  Culture yields may be altered by pre-surgical antibiotics.  Reviewed history of penicillin allergy with penicillins last received over 30 years ago.  Less than 1% of population generally has penicillin allergy.  Trial Unasyn.  Continue wound care per ENT.  Type 2 diabetes -most recent A1c of 8.8 on 09/02/2018.  Encouraged to optimize blood sugar control to aid in healing processes.  Continue management per primary team.  Healthcare maintenance -hepatitis C screening completed on 05/22/2016 and negative; HIV screening completed on 09/02/2018.    Principal Problem:   Mandibular abscess Active Problems:   Type 2 diabetes mellitus without complications (HCC)   Anxiety and depression   Bipolar 1 disorder (HCC)   Hypertension   Hyperlipidemia associated with type 2 diabetes mellitus ([place])   . insulin aspart  0-15 Units Subcutaneous Q4H  . insulin glargine  6 Units Subcutaneous Daily  . lithium carbonate  600 mg Oral QHS  . loratadine  10 mg Oral Daily  . methylphenidate  20 mg Oral TID WC  . pantoprazole  40 mg Oral Daily  . rosuvastatin  20 mg Oral Daily  . venlafaxine XR  150 mg Oral Q breakfast     HPI: Adrienne [name] is a 63 y.o. female with previous medical history of seizure disorder, sleep apnea,  type 2 diabetes, macular degeneration, hypertension, emphysema, asthma, and depression presenting to the ED and admitted with progressive swelling of her lower jaw following recent removal of 2 lower teeth on the right side about 1 week prior. Febrile and trismus on admission.   In the ED had a temperature of 100.9 and was tachycardic.  On physical exam noted to have trismus with purulence coming from the right lower jaw.  White blood cell count on admission was 24.2.  CT soft tissue of the neck with contrast showed extensive odontogenic phlegmon and serpigninous abscess of the right lower masticator, right submandibular, and lateral right sublingual spaces; subperiosteal abscess in and around the posterior most right mandible tooth extraction site and probable focal osteomyelitis at the roots of that extraction site.  Transferred to [facility] for [facility] evaluation and brought to the OR for right deep space neck abscess with I&D of the abscess completed on 09/03/18. Pus was encountered upon cavitary entry. Cultures were obtained.   Ms. Odriscoll has been afebrile since admission with improving WBC with latest being 14.5. Has completed 4 days of clindamycin and 2 days of levofloxacin to date. She received 1 dose of aztreonam on 6/26. Admission blood cultures are without growth to date. Surgical specimens with gram positive cocci in pairs.    Review of Systems: Review of Systems  Constitutional: Negative for chills, fever and weight loss.  Respiratory: Negative for cough, shortness of breath and wheezing.   Cardiovascular:  Negative for chest pain and leg swelling.  Gastrointestinal: Negative for abdominal pain, constipation, diarrhea, nausea and vomiting.  Musculoskeletal: Positive for neck pain.  Skin: Negative for rash.     Past Medical History:  Diagnosis Date  . Allergic rhinitis   . Allergy   . Anemia    leukocytosis/thrombocytosis--Dr. Tressie Stalker  . Anxiety   . Arthritis   . Asthma   .  Brain injury ([place])    Closed head  . Closed head injury 2005  . Complication of anesthesia    "my blood pressure dropped"  . COPD (chronic obstructive pulmonary disease) ([place])   . Depression    ? Bipolar Disorder--Dr, Jimmye Norman  . Emphysema of lung ([place])   . GERD (gastroesophageal reflux disease)    Barrett's esophagus  . Headache    migraines  . Hx of migraines   . Hypertension   . Low back pain   . Macular degeneration, bilateral    Dry-Dr. Iona Hansen  . Neuromuscular disorder ([place])   . NIDDM (non-insulin dependent diabetes mellitus)   . OSA (obstructive sleep apnea)    cannot tolerate mask  . RLS (restless legs syndrome)   . Seizure disorder ([place])    ? absence; due to head truama  . Seizures ([place])   . Sleep apnea   . Status post right hip replacement   . Status post rotator cuff surgery     Social History   Tobacco Use  . Smoking status: Current Some Day Smoker    Packs/day: 0.50    Years: 40.00    Pack years: 20.00    Types: Cigarettes    Start date: 03/03/1971  . Smokeless tobacco: Never Used  Substance Use Topics  . Alcohol use: No  . Drug use: No    Family History  Problem Relation Age of Onset  . Heart disease Father 43  . Atrial fibrillation Mother   . Cancer Mother        carcinoid  . Diabetes Maternal Grandfather   . Cancer Maternal Grandfather   . Heart disease Maternal Grandmother   . Thyroid disease Son     Allergies  Allergen Reactions  . Codeine Other (See Comments)    REACTION: headaches  . Dilaudid [Hydromorphone Hcl] Itching  . Morphine And Related Itching and Other (See Comments)    Side effect  . Penicillins Rash and Other (See Comments)    Has patient had a PCN reaction causing immediate rash, facial/tongue/throat swelling, SOB or lightheadedness with hypotension: Yes Has patient had a PCN reaction causing severe rash involving mucus membranes or skin necrosis: Yes Has patient had a PCN reaction that required hospitalization No Has  patient had a PCN reaction occurring within the last 10 years: No If all of the above answers are "NO", then may proceed with Cephalosporin use.   . Sulfonamide Derivatives Rash    OBJECTIVE: Blood pressure 125/80, pulse (!) 59, temperature 97.9 F (36.6 C), temperature source Oral, resp. rate 18, height 5\' 3"  (1.6 m), weight 81.1 kg, SpO2 92 %.  Physical Exam Constitutional:      General: She is not in acute distress.    Appearance: She is well-developed.     Comments: Lying in bed with head of bed elevated; pleasant  Neck:     Comments: Surgical dressing in place with mild shadowing.  Wound appears well approximated with staples present and Penrose drain patent. Cardiovascular:     Rate and Rhythm: Normal rate and regular rhythm.  Heart sounds: Normal heart sounds.  Pulmonary:     Effort: Pulmonary effort is normal.     Breath sounds: Normal breath sounds.  Skin:    General: Skin is warm and dry.  Neurological:     Mental Status: She is alert and oriented to person, place, and time.  Psychiatric:        Mood and Affect: Mood normal.     Lab Results Lab Results  Component Value Date   WBC 14.5 (H) 09/05/2018   HGB 12.6 09/05/2018   HCT 38.3 09/05/2018   MCV 94.1 09/05/2018   PLT 417 (H) 09/05/2018    Lab Results  Component Value Date   CREATININE 0.85 09/05/2018   BUN 15 09/05/2018   NA 137 09/05/2018   K 3.9 09/05/2018   CL 99 09/05/2018   CO2 27 09/05/2018    Lab Results  Component Value Date   ALT 18 02/04/2018   AST 22 02/04/2018   ALKPHOS 48 02/04/2018   BILITOT 0.7 02/04/2018     Microbiology: Recent Results (from the past 240 hour(s))  Blood Culture (routine x 2)     Status: None (Preliminary result)   Collection Time: 09/02/18 12:15 PM   Specimen: BLOOD LEFT ARM  Result Value Ref Range Status   Specimen Description BLOOD LEFT ARM  Final   Special Requests   Final    BOTTLES DRAWN AEROBIC AND ANAEROBIC Blood Culture adequate volume    Culture   Final    NO GROWTH 3 DAYS Performed at [facility], [address], [place], [place] [address]    Report Status PENDING  Incomplete  SARS Coronavirus 2 (CEPHEID - Performed in [facility] hospital lab), Hosp Order     Status: None   Collection Time: 09/02/18 12:16 PM   Specimen: Nasopharyngeal Swab  Result Value Ref Range Status   SARS Coronavirus 2 NEGATIVE NEGATIVE Final    Comment: (NOTE) If result is NEGATIVE SARS-CoV-2 target nucleic acids are NOT DETECTED. The SARS-CoV-2 RNA is generally detectable in upper and lower  respiratory specimens during the acute phase of infection. The lowest  concentration of SARS-CoV-2 viral copies this assay can detect is 250  copies / mL. A negative result does not preclude SARS-CoV-2 infection  and should not be used as the sole basis for treatment or other  patient management decisions.  A negative result may occur with  improper specimen collection / handling, submission of specimen other  than nasopharyngeal swab, presence of viral mutation(s) within the  areas targeted by this assay, and inadequate number of viral copies  (<250 copies / mL). A negative result must be combined with clinical  observations, patient history, and epidemiological information. If result is POSITIVE SARS-CoV-2 target nucleic acids are DETECTED. The SARS-CoV-2 RNA is generally detectable in upper and lower  respiratory specimens dur ing the acute phase of infection.  Positive  results are indicative of active infection with SARS-CoV-2.  Clinical  correlation with patient history and other diagnostic information is  necessary to determine patient infection status.  Positive results do  not rule out bacterial infection or co-infection with other viruses. If result is PRESUMPTIVE POSTIVE SARS-CoV-2 nucleic acids MAY BE PRESENT.   A presumptive positive result was obtained on the submitted specimen  and confirmed on repeat testing.  While 2019 novel  coronavirus  (SARS-CoV-2) nucleic acids may be present in the submitted sample  additional confirmatory testing may be necessary for epidemiological  and / or clinical  management purposes  to differentiate between  SARS-CoV-2 and other Sarbecovirus currently known to infect humans.  If clinically indicated additional testing with an alternate test  methodology [phone]) is advised. The SARS-CoV-2 RNA is generally  detectable in upper and lower respiratory sp ecimens during the acute  phase of infection. The expected result is Negative. Fact Sheet for Patients:  StrictlyIdeas.no Fact Sheet for Healthcare Providers: BankingDealers.co.za This test is not yet approved or cleared by the [place] FDA and has been authorized for detection and/or diagnosis of SARS-CoV-2 by FDA under an Emergency Use Authorization (EUA).  This EUA will remain in effect (meaning this test can be used) for the duration of the COVID-19 declaration under Section 564(b)(1) of the Act, 21 U.S.C. section 360bbb-3(b)(1), unless the authorization is terminated or revoked sooner. Performed at [facility], [address] [place], [place] [address]   Blood Culture (routine x 2)     Status: None (Preliminary result)   Collection Time: 09/02/18 12:23 PM   Specimen: BLOOD RIGHT ARM  Result Value Ref Range Status   Specimen Description BLOOD RIGHT ARM  Final   Special Requests   Final    BOTTLES DRAWN AEROBIC AND ANAEROBIC Blood Culture results may not be optimal due to an excessive volume of blood received in culture bottles   Culture   Final    NO GROWTH 3 DAYS Performed at [facility], [address], [place], [place] [address]    Report Status PENDING  Incomplete  Aerobic/Anaerobic Culture (surgical/deep wound)     Status: None (Preliminary result)   Collection Time: 09/03/18  4:37 PM   Specimen: Abscess  Result Value Ref Range Status   Specimen Description  ABSCESS RIGHT NECK  Final   Special Requests PATIENT ON FOLLOWING CLEOCIN AZACTAM LEVAQUIN  Final   Gram Stain   Final    RARE WBC PRESENT, PREDOMINANTLY PMN FEW GRAM POSITIVE COCCI IN PAIRS    Culture   Final    CULTURE REINCUBATED FOR BETTER GROWTH Performed at [facility], [place] [address], [place], [place] [address]    Report Status PENDING  Incomplete     Terri Piedra, [place] for [place] Pager  09/05/2018  9:36 AM

## 2018-09-06 ENCOUNTER — Inpatient Hospital Stay: Payer: Self-pay

## 2018-09-06 DIAGNOSIS — M869 Osteomyelitis, unspecified: Secondary | ICD-10-CM

## 2018-09-06 DIAGNOSIS — K089 Disorder of teeth and supporting structures, unspecified: Secondary | ICD-10-CM

## 2018-09-06 DIAGNOSIS — B954 Other streptococcus as the cause of diseases classified elsewhere: Secondary | ICD-10-CM

## 2018-09-06 DIAGNOSIS — Z883 Allergy status to other anti-infective agents status: Secondary | ICD-10-CM

## 2018-09-06 LAB — GLUCOSE, CAPILLARY
Glucose-Capillary: 117 mg/dL — ABNORMAL HIGH (ref 70–99)
Glucose-Capillary: 117 mg/dL — ABNORMAL HIGH (ref 70–99)
Glucose-Capillary: 130 mg/dL — ABNORMAL HIGH (ref 70–99)
Glucose-Capillary: 151 mg/dL — ABNORMAL HIGH (ref 70–99)
Glucose-Capillary: 229 mg/dL — ABNORMAL HIGH (ref 70–99)
Glucose-Capillary: 237 mg/dL — ABNORMAL HIGH (ref 70–99)

## 2018-09-06 LAB — CBC
HCT: 40.5 % (ref 36.0–46.0)
Hemoglobin: 12.8 g/dL (ref 12.0–15.0)
MCH: 29.6 pg (ref 26.0–34.0)
MCHC: 31.6 g/dL (ref 30.0–36.0)
MCV: 93.8 fL (ref 80.0–100.0)
Platelets: 430 10*3/uL — ABNORMAL HIGH (ref 150–400)
RBC: 4.32 MIL/uL (ref 3.87–5.11)
RDW: 13.4 % (ref 11.5–15.5)
WBC: 12.1 10*3/uL — ABNORMAL HIGH (ref 4.0–10.5)
nRBC: 0 % (ref 0.0–0.2)

## 2018-09-06 MED ORDER — FENTANYL CITRATE (PF) 100 MCG/2ML IJ SOLN
25.0000 ug | INTRAMUSCULAR | Status: DC | PRN
  Administered 2018-09-06 – 2018-09-09 (×8): 50 ug via INTRAVENOUS
  Filled 2018-09-06 (×8): qty 2

## 2018-09-06 MED ORDER — SODIUM CHLORIDE 0.9% FLUSH: 10.0000 mL | INTRAVENOUS | Status: DC | PRN

## 2018-09-06 MED ORDER — PENICILLIN G POTASSIUM 20000000 UNITS IJ SOLR
12.0000 10*6.[IU] | Freq: Two times a day (BID) | INTRAVENOUS | Status: DC
  Administered 2018-09-06 – 2018-09-09 (×6): 12 10*6.[IU] via INTRAVENOUS
  Filled 2018-09-06: qty 2
  Filled 2018-09-06 (×2): qty 12
  Filled 2018-09-06: qty 2
  Filled 2018-09-06 (×3): qty 12

## 2018-09-06 MED ORDER — SODIUM CHLORIDE 0.9% FLUSH
10.0000 mL | Freq: Two times a day (BID) | INTRAVENOUS | Status: DC
  Administered 2018-09-08: 10 mL

## 2018-09-06 NOTE — Progress Notes (Signed)
[facility] for Infectious Disease    Date of Admission:  09/02/2018   Total days of antibiotics 5           ID: Adrienne [name] is a 63 y.o. 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 Still has penrose drain on right side of neck. Minimal drainge. Denies any itching or rash with abtx  Medications:  . insulin aspart  0-15 Units Subcutaneous Q4H  . insulin glargine  6 Units Subcutaneous Daily  . lithium carbonate  600 mg Oral QHS  . loratadine  10 mg Oral Daily  . methylphenidate  20 mg Oral TID WC  . pantoprazole  40 mg Oral Daily  . rosuvastatin  20 mg Oral Daily  . venlafaxine XR  150 mg Oral Q breakfast    Objective: Vital signs in last 24 hours: Temp:  [98 F (36.7 C)-98.3 F (36.8 C)] 98 F (36.7 C) (06/30 1646) Pulse Rate:  [57-65] 65 (06/30 1646) Resp:  [15-16] 16 (06/30 1646) BP: (136-143)/(70-82) 137/70 (06/30 1646) SpO2:  [91 %-93 %] 93 % (06/30 1646) Weight:  [82.8 kg] 82.8 kg (06/30 0500) Physical Exam  Constitutional:  oriented to person, place, and time. appears well-developed and well-nourished. No distress.  HENT: [place]/AT, PERRLA, no scleral icterus, Right submandibular swelling with penrose in place,  Mouth/Throat: Oropharynx is clear and moist. No oropharyngeal exudate.  Cardiovascular: Normal rate, regular rhythm and normal heart sounds. Exam reveals no gallop and no friction rub.  No murmur heard.  Pulmonary/Chest: Effort normal and breath sounds normal. No respiratory distress.  has no wheezes.  Neck = supple, no nuchal rigidity Abdominal: Soft. Bowel sounds are normal.  exhibits no distension. There is no tenderness.  Lymphadenopathy: no cervical adenopathy. No axillary  adenopathy Neurological: alert and oriented to person, place, and time.  Skin: Skin is warm and dry. No rash noted. No erythema.  Psychiatric: a normal mood and affect.  behavior is normal.     Lab Results Recent Labs    09/05/18 0343 09/06/18 0403  WBC 14.5* 12.1*  HGB 12.6 12.8  HCT 38.3 40.5  NA 137  --   K 3.9  --   CL 99  --   CO2 27  --   BUN 15  --   CREATININE 0.85  --    Liver Panel No results for input(s): PROT, ALBUMIN, AST, ALT, ALKPHOS, BILITOT, BILIDIR, IBILI in the last 72 hours. Sedimentation Rate Recent Labs    09/04/18 1639  ESRSEDRATE 62*   C-Reactive Protein Recent Labs    09/04/18 1639  CRP 15.2*    Microbiology: FEW STREPTOCOCCUS GROUP F  Studies/Results: [place] Ekg Site Rite  Result Date: 09/06/2018 If Site Rite image not attached, placement could not be confirmed due to current cardiac rhythm.   Assessment/Plan: Streptococcal mandibular osteomyelitis = plan to treat with 6 wk of IV Penicillin, plan for continuous infusion at home. Will transition to oral abtx after 6 wk. Will see her back in the id clinic in 4-6 wk.  Please get weekly cbc, bmp. Sed rate and crp  Will sign off.    [facility] for Infectious Diseases Cell: [phone] Pager: [phone]  09/06/2018, 6:35 PM

## 2018-09-06 NOTE — Progress Notes (Signed)
Received order for PICC;  Plan to place later today.

## 2018-09-06 NOTE — Progress Notes (Signed)
Dr. Cyndia Skeeters paged for something else for pain for patient complaining of 10/10 pain.

## 2018-09-06 NOTE — Plan of Care (Signed)
  Problem: Education: Goal: Knowledge of General Education information will improve Description: Including pain rating scale, medication(s)/side effects and non-pharmacologic comfort measures Outcome: Progressing   Problem: Clinical Measurements: Goal: Will remain free from infection Outcome: Progressing Goal: Diagnostic test results will improve Outcome: Progressing   Problem: Coping: Goal: Level of anxiety will decrease Outcome: Progressing   Problem: Pain Managment: Goal: General experience of comfort will improve Outcome: Progressing   Problem: Safety: Goal: Ability to remain free from injury will improve Outcome: Progressing

## 2018-09-06 NOTE — Care Management Important Message (Signed)
Important Message  Patient Details  Name: Adrienne [name] MRN: [id number] Date of Birth: February 04, 1956   Medicare Important Message Given:  Yes     Memory Argue 09/06/2018, 5:20 PM

## 2018-09-06 NOTE — Progress Notes (Signed)
   Subjective:    Patient ID: Adrienne [name], female    DOB: 1955/04/26, 63 y.o.   MRN: [id number]  Feels right submandibular space is a little more swollen. +severe pain this morning.  On IV Unasyn Infectious Disease weighing in, will have PICC later today Tolerating diet, secretions, lying flat easily  Feels a little better but still having pretty bad pain that comes and goes.  Review of Systems     Objective:   Physical Exam Vitals signs reviewed.  Constitutional:      General: She is not in acute distress.    Appearance: Normal appearance.  HENT:     Head: Normocephalic.     Nose: Nose normal.     Mouth/Throat:     Mouth: Mucous membranes are moist.     Comments: Floor of mouth is soft +1.5cm trismus No purulence intraorally  Eyes:     Extraocular Movements: Extraocular movements intact.     Conjunctiva/sclera: Conjunctivae normal.     Pupils: Pupils are equal, round, and reactive to light.  Neck:     Comments: +pitting edema right submandibular space with some erythema. Upon pressure and irrigation, some purulence was extruded through the wound bed. Penrose in place. Some staples medially removed and wound bed irrigated with saline. Swelling improved.  Cardiovascular:     Rate and Rhythm: Normal rate.  Pulmonary:     Effort: Pulmonary effort is normal.  Abdominal:     General: Abdomen is flat.     Palpations: Abdomen is soft.  Neurological:     Mental Status: She is alert.      WBC improving. Afebrile.   Cultures:  RARE WBC PRESENT, PREDOMINANTLY PMN  FEW GRAM POSITIVE COCCI IN PAIRS  Performed at [facility], [place] [address], [place], [place] [address]    Culture FEW STREPTOCOCCUS GROUP F  NO ANAEROBES ISOLATED; CULTURE IN PROGRESS FOR 5 DAYS        Assessment & Plan:  Right submandibular odontogenic abscess s/p I&D. Performed bedside washout this morning.   Expect slow progress.  Culture pending.  Penrose drain in place.  Will leave Penrose in  place for a couple more days.   In surgery, she had some exposed mandibular bone at the right inferior alveolar ridge where teeth had been extracted.   There is some persistent trismus.   -Continue dressing changes at least BID. Wear neck dressing loosely to allow for egress of fluid -Encouraged patient to continue massage and expression of purulence inferiorly.  -Defer to ID for abx management and PICC placement  -Will follow   Helayne Seminole, MD  [facility], [place] Office phone [phone]

## 2018-09-06 NOTE — Progress Notes (Signed)
PROGRESS NOTE  Adrienne [name] JSH:[id number] DOB: 1956/01/28 DOA: 09/02/2018 PCP: Asencion Noble, MD   LOS: 4 days   Patient is from: Home.  Brief Narrative / Interim history: 63 year old female with COPD, DM-2, HLD, bipolar disorder, anxiety, depression and ADD?[name]  She presented to [facility] on 6/26 with right jaw pain and swelling after recent dental extraction on 6/22.  CT showed right jaw and neck abscess and cellulitis.  Failed outpatient treatment with clindamycin.  Transferred to [facility] for ENT evaluation and care.  Patient had I&D and Penrose drain placement by doctors on 6/27.  Blood cultures negative.  Fluid Gram stain with GPC's in pairs.  Wound culture pending.  Subjective: No major events overnight of this morning.  Pain better after ENT squeezed some pus out this morning.  Swelling improved.  Reports having good night.  Objective: Vitals:   09/05/18 1606 09/05/18 2248 09/06/18 0425 09/06/18 0500  BP: (!) 133/91 136/82 (!) 143/74   Pulse: 71 65 (!) 57   Resp:   15   Temp:  98.3 F (36.8 C) 98.1 F (36.7 C)   TempSrc:      SpO2: 94% 91% 91%   Weight:    82.8 kg  Height:        Intake/Output Summary (Last 24 hours) at 09/06/2018 1427 Last data filed at 09/06/2018 1030 Gross per 24 hour  Intake 440 ml  Output 2200 ml  Net -1760 ml   Filed Weights   09/02/18 1131 09/05/18 0444 09/06/18 0500  Weight: 75.8 kg 81.1 kg 82.8 kg    Examination:  GENERAL: No acute distress.  Appears well.  HEENT: MMM. Mild swelling and tenderness over right face and neck NECK: Swelling and tenderness on the right.  Penrose drain in place. LUNGS:  No IWOB. Good air movement bilaterally. HEART:  RRR. Heart sounds normal.  ABD: Bowel sounds present. Soft. Non tender.  MSK/EXT:  Moves all extremities. No apparent deformity. No edema bilaterally.  SKIN: Surgical wound in right leg. NEURO: Awake, alert and oriented appropriately.  No gross deficit.  PSYCH: Calm. Normal affect.   On 09/05/2018    I have personally reviewed the following labs and images: CBC: Recent Labs  Lab 09/02/18 1356 09/03/18 0816 09/04/18 0424 09/05/18 0343 09/06/18 0403  WBC 24.2* 16.7* 17.4* 14.5* 12.1*  NEUTROABS 20.1*  --   --   --   --   HGB 13.7 12.2 12.3 12.6 12.8  HCT 42.3 37.4 39.5 38.3 40.5  MCV 92.0 92.8 95.4 94.1 93.8  PLT 364 336 378 417* 850*   Basic Metabolic Panel: Recent Labs  Lab 09/02/18 1356 09/05/18 0343  NA 132* 137  K 4.2 3.9  CL 92* 99  CO2 28 27  GLUCOSE 300* 99  BUN 28* 15  CREATININE 0.96 0.85  CALCIUM 9.3 8.7*   GFR: Estimated Creatinine Clearance: 68.2 mL/min (by C-G formula based on SCr of 0.85 mg/dL). Liver Function Tests: No results for input(s): AST, ALT, ALKPHOS, BILITOT, PROT, ALBUMIN in the last 168 hours. No results for input(s): LIPASE, AMYLASE in the last 168 hours. No results for input(s): AMMONIA in the last 168 hours. Coagulation Profile: No results for input(s): INR, PROTIME in the last 168 hours. Cardiac Enzymes: No results for input(s): CKTOTAL, CKMB, CKMBINDEX, TROPONINI in the last 168 hours. BNP (last 3 results) No results for input(s): PROBNP in the last 8760 hours. HbA1C: No results for input(s): HGBA1C in the last 72 hours. CBG: Recent  Labs  Lab 09/05/18 1952 09/06/18 0022 09/06/18 0421 09/06/18 0752 09/06/18 1202  GLUCAP 183* 130* 117* 151* 237*   Lipid Profile: No results for input(s): CHOL, HDL, LDLCALC, TRIG, CHOLHDL, LDLDIRECT in the last 72 hours. Thyroid Function Tests: No results for input(s): TSH, T4TOTAL, FREET4, T3FREE, THYROIDAB in the last 72 hours. Anemia Panel: No results for input(s): VITAMINB12, FOLATE, FERRITIN, TIBC, IRON, RETICCTPCT in the last 72 hours. Urine analysis:    Component Value Date/Time   COLORURINE YELLOW 09/02/2018 1151   APPEARANCEUR HAZY (A) 09/02/2018 1151   LABSPEC >1.046 (H) 09/02/2018 1151   PHURINE 5.0 09/02/2018 1151   GLUCOSEU 150 (A) 09/02/2018 1151    HGBUR SMALL (A) 09/02/2018 1151   BILIRUBINUR NEGATIVE 09/02/2018 1151   KETONESUR NEGATIVE 09/02/2018 1151   PROTEINUR 30 (A) 09/02/2018 1151   UROBILINOGEN 0.2 12/16/2007 1404   NITRITE NEGATIVE 09/02/2018 1151   LEUKOCYTESUR NEGATIVE 09/02/2018 1151   Sepsis Labs: Invalid input(s): PROCALCITONIN, LACTICIDVEN  Recent Results (from the past 240 hour(s))  Blood Culture (routine x 2)     Status: None (Preliminary result)   Collection Time: 09/02/18 12:15 PM   Specimen: BLOOD LEFT ARM  Result Value Ref Range Status   Specimen Description BLOOD LEFT ARM  Final   Special Requests   Final    BOTTLES DRAWN AEROBIC AND ANAEROBIC Blood Culture adequate volume   Culture   Final    NO GROWTH 4 DAYS Performed at [facility], [address] [place], [place] [address]    Report Status PENDING  Incomplete  SARS Coronavirus 2 (CEPHEID - Performed in [place] hospital lab), Hosp Order     Status: None   Collection Time: 09/02/18 12:16 PM   Specimen: Nasopharyngeal Swab  Result Value Ref Range Status   SARS Coronavirus 2 NEGATIVE NEGATIVE Final    Comment: (NOTE) If result is NEGATIVE SARS-CoV-2 target nucleic acids are NOT DETECTED. The SARS-CoV-2 RNA is generally detectable in upper and lower  respiratory specimens during the acute phase of infection. The lowest  concentration of SARS-CoV-2 viral copies this assay can detect is 250  copies / mL. A negative result does not preclude SARS-CoV-2 infection  and should not be used as the sole basis for treatment or other  patient management decisions.  A negative result may occur with  improper specimen collection / handling, submission of specimen other  than nasopharyngeal swab, presence of viral mutation(s) within the  areas targeted by this assay, and inadequate number of viral copies  (<250 copies / mL). A negative result must be combined with clinical  observations, patient history, and epidemiological information. If result is  POSITIVE SARS-CoV-2 target nucleic acids are DETECTED. The SARS-CoV-2 RNA is generally detectable in upper and lower  respiratory specimens dur ing the acute phase of infection.  Positive  results are indicative of active infection with SARS-CoV-2.  Clinical  correlation with patient history and other diagnostic information is  necessary to determine patient infection status.  Positive results do  not rule out bacterial infection or co-infection with other viruses. If result is PRESUMPTIVE POSTIVE SARS-CoV-2 nucleic acids MAY BE PRESENT.   A presumptive positive result was obtained on the submitted specimen  and confirmed on repeat testing.  While 2019 novel coronavirus  (SARS-CoV-2) nucleic acids may be present in the submitted sample  additional confirmatory testing may be necessary for epidemiological  and / or clinical management purposes  to differentiate between  SARS-CoV-2 and other Sarbecovirus currently known  to infect humans.  If clinically indicated additional testing with an alternate test  methodology [phone]) is advised. The SARS-CoV-2 RNA is generally  detectable in upper and lower respiratory sp ecimens during the acute  phase of infection. The expected result is Negative. Fact Sheet for Patients:  StrictlyIdeas.no Fact Sheet for Healthcare Providers: BankingDealers.co.za This test is not yet approved or cleared by the [place] FDA and has been authorized for detection and/or diagnosis of SARS-CoV-2 by FDA under an Emergency Use Authorization (EUA).  This EUA will remain in effect (meaning this test can be used) for the duration of the COVID-19 declaration under Section 564(b)(1) of the Act, 21 U.S.C. section 360bbb-3(b)(1), unless the authorization is terminated or revoked sooner. Performed at [facility], [address] [place], [place] [address]   Blood Culture (routine x 2)     Status: None (Preliminary  result)   Collection Time: 09/02/18 12:23 PM   Specimen: BLOOD RIGHT ARM  Result Value Ref Range Status   Specimen Description BLOOD RIGHT ARM  Final   Special Requests   Final    BOTTLES DRAWN AEROBIC AND ANAEROBIC Blood Culture results may not be optimal due to an excessive volume of blood received in culture bottles   Culture   Final    NO GROWTH 4 DAYS Performed at [facility], [address], [place], [place] [address]    Report Status PENDING  Incomplete  Aerobic/Anaerobic Culture (surgical/deep wound)     Status: None (Preliminary result)   Collection Time: 09/03/18  4:37 PM   Specimen: Abscess  Result Value Ref Range Status   Specimen Description ABSCESS RIGHT NECK  Final   Special Requests PATIENT ON FOLLOWING CLEOCIN AZACTAM LEVAQUIN  Final   Gram Stain   Final    RARE WBC PRESENT, PREDOMINANTLY PMN FEW GRAM POSITIVE COCCI IN PAIRS Performed at [facility], [place] [address] [place], [place] [address]    Culture   Final    FEW STREPTOCOCCUS GROUP F NO ANAEROBES ISOLATED; CULTURE IN PROGRESS FOR 5 DAYS    Report Status PENDING  Incomplete     Radiology Studies: [place] Ekg Site Rite  Result Date: 09/06/2018 If Site Rite image not attached, placement could not be confirmed due to current cardiac rhythm.    Procedures:  None  Microbiology: [id number] negative Blood cultures negative Wound cultures with few Streptococcus group F  Assessment & Plan: Right jaw abscess/cellulitis/possible right mandibular osteomyelitis after recent dental extraction -Failed outpatient treatment with p.o. clindamycin. -I&D and Penrose drain placement on 6/27 by Dr. Redmond Baseman. -Wound culture with few Streptococcus group F-sensitivity pending -Blood cultures negative. -No stigmata of endocarditis. -IV Levaquin and IV clindamycin 6/26-6/29 -IV Unasyn 6/29-- -PRN fentanyl for pain -Appreciate care by ENT and ID. -PICC line after culture sensitivity  Leukocytosis: Likely due to the  above.  Improving. -Continue monitoring.  Lactic acidosis: Likely due to #1.  Resolved after IV fluid.  Poorly controlled NIDDM-2 with hyperglycemia: A1c 8.8%.  On metformin and glimepiride at home.  -Continue Lantus at 6 units. -SSI-moderate. -Continue statin  Bipolar disorder type I/anxiety/depression: Stable -Continue home lithium, Xanax, Effexor and Ritalin  DVT prophylaxis: Start subcu Lovenox. Code Status: Full code Family Communication: Patient would like to update her family after surgery and let me know if any question. Disposition Plan: Remains inpatient on IV antibiotics. Consultants: ENT and ID  Antimicrobials: Anti-infectives (From admission, onward)   Start     Dose/Rate Route Frequency Ordered Stop  09/05/18 1200  Ampicillin-Sulbactam (UNASYN) 3 g in sodium chloride 0.9 % 100 mL IVPB     3 g 200 mL/hr over 30 Minutes Intravenous Every 6 hours 09/05/18 1109     09/02/18 2200  aztreonam (AZACTAM) 2 g in sodium chloride 0.9 % 100 mL IVPB  Status:  Discontinued     2 g 200 mL/hr over 30 Minutes Intravenous Every 8 hours 09/02/18 1536 09/02/18 1737   09/02/18 1745  clindamycin (CLEOCIN) IVPB 600 mg  Status:  Discontinued     600 mg 100 mL/hr over 30 Minutes Intravenous Every 8 hours 09/02/18 1737 09/05/18 1109   09/02/18 1745  levofloxacin (LEVAQUIN) IVPB 750 mg  Status:  Discontinued     750 mg 100 mL/hr over 90 Minutes Intravenous Every 24 hours 09/02/18 1737 09/05/18 0951   09/02/18 1530  aztreonam (AZACTAM) 2 g in sodium chloride 0.9 % 100 mL IVPB     2 g 200 mL/hr over 30 Minutes Intravenous  Once 09/02/18 1524 09/02/18 1808   09/02/18 1200  clindamycin (CLEOCIN) IVPB 900 mg     900 mg 100 mL/hr over 30 Minutes Intravenous  Once 09/02/18 1152 09/02/18 1400      Scheduled Meds: . insulin aspart  0-15 Units Subcutaneous Q4H  . insulin glargine  6 Units Subcutaneous Daily  . lithium carbonate  600 mg Oral QHS  . loratadine  10 mg Oral Daily  .  methylphenidate  20 mg Oral TID WC  . pantoprazole  40 mg Oral Daily  . rosuvastatin  20 mg Oral Daily  . venlafaxine XR  150 mg Oral Q breakfast   Continuous Infusions: . ampicillin-sulbactam (UNASYN) IV 3 g (09/06/18 1212)  . lactated ringers 10 mL/hr at 09/03/18 1854   PRN Meds:.acetaminophen **OR** acetaminophen, albuterol, ALPRAZolam, diphenhydrAMINE **OR** diphenhydrAMINE, fentaNYL (SUBLIMAZE) injection, ondansetron **OR** ondansetron (ZOFRAN) IV, traMADol   Normand Damron T. Herman  If 7PM-7AM, please contact night-coverage [email or web address] Password [email or web address] 09/06/2018, 2:27 PM

## 2018-09-06 NOTE — Progress Notes (Signed)
Peripherally Inserted Central Catheter/Midline Placement  The IV Nurse has discussed with the patient and/or persons authorized to consent for the patient, the purpose of this procedure and the potential benefits and risks involved with this procedure.  The benefits include less needle sticks, lab draws from the catheter, and the patient may be discharged home with the catheter. Risks include, but not limited to, infection, bleeding, blood clot (thrombus formation), and puncture of an artery; nerve damage and irregular heartbeat and possibility to perform a PICC exchange if needed/ordered by physician.  Alternatives to this procedure were also discussed.  Bard Power PICC patient education guide, fact sheet on infection prevention and patient information card has been provided to patient /or left at bedside.    PICC/Midline Placement Documentation  PICC Single Lumen 15/52/08 PICC Right Basilic 38 cm 0 cm (Active)  Indication for Insertion or Continuance of Line Home intravenous therapies (PICC only) 09/06/18 2231  Exposed Catheter (cm) 0 cm 09/06/18 2231  Site Assessment Clean;Dry;Intact 09/06/18 2231  Line Status Flushed;Saline locked;Blood return noted 09/06/18 2231  Dressing Type Transparent 09/06/18 2231  Dressing Status Clean;Intact;Dry;Antimicrobial disc in place 09/06/18 2231  Dressing Change Due 09/13/18 09/06/18 2231       Gordan Payment 09/06/2018, 10:32 PM

## 2018-09-07 DIAGNOSIS — F419 Anxiety disorder, unspecified: Secondary | ICD-10-CM

## 2018-09-07 DIAGNOSIS — F319 Bipolar disorder, unspecified: Secondary | ICD-10-CM

## 2018-09-07 LAB — GLUCOSE, CAPILLARY
Glucose-Capillary: 125 mg/dL — ABNORMAL HIGH (ref 70–99)
Glucose-Capillary: 130 mg/dL — ABNORMAL HIGH (ref 70–99)
Glucose-Capillary: 167 mg/dL — ABNORMAL HIGH (ref 70–99)
Glucose-Capillary: 174 mg/dL — ABNORMAL HIGH (ref 70–99)
Glucose-Capillary: 211 mg/dL — ABNORMAL HIGH (ref 70–99)
Glucose-Capillary: 218 mg/dL — ABNORMAL HIGH (ref 70–99)

## 2018-09-07 LAB — CBC
HCT: 40.6 % (ref 36.0–46.0)
Hemoglobin: 12.8 g/dL (ref 12.0–15.0)
MCH: 29.6 pg (ref 26.0–34.0)
MCHC: 31.5 g/dL (ref 30.0–36.0)
MCV: 93.8 fL (ref 80.0–100.0)
Platelets: 447 10*3/uL — ABNORMAL HIGH (ref 150–400)
RBC: 4.33 MIL/uL (ref 3.87–5.11)
RDW: 13.2 % (ref 11.5–15.5)
WBC: 12.8 10*3/uL — ABNORMAL HIGH (ref 4.0–10.5)
nRBC: 0 % (ref 0.0–0.2)

## 2018-09-07 LAB — CULTURE, BLOOD (ROUTINE X 2)
Culture: NO GROWTH
Culture: NO GROWTH
Special Requests: ADEQUATE

## 2018-09-07 LAB — BASIC METABOLIC PANEL
Anion gap: 10 (ref 5–15)
BUN: 11 mg/dL (ref 8–23)
CO2: 27 mmol/L (ref 22–32)
Calcium: 9 mg/dL (ref 8.9–10.3)
Chloride: 100 mmol/L (ref 98–111)
Creatinine, Ser: 0.73 mg/dL (ref 0.44–1.00)
GFR calc Af Amer: >60 mL/min (ref >60)
GFR calc non Af Amer: >60 mL/min (ref >60)
Glucose, Bld: 143 mg/dL — ABNORMAL HIGH (ref 70–99)
Potassium: 3.8 mmol/L (ref 3.5–5.1)
Sodium: 137 mmol/L (ref 135–145)

## 2018-09-07 MED ORDER — INSULIN ASPART 100 UNIT/ML ~~LOC~~ SOLN
0.0000 [IU] | Freq: Three times a day (TID) | SUBCUTANEOUS | Status: DC
  Administered 2018-09-08: 5 [IU] via SUBCUTANEOUS
  Administered 2018-09-08: 3 [IU] via SUBCUTANEOUS
  Administered 2018-09-08: 5 [IU] via SUBCUTANEOUS
  Administered 2018-09-09: 2 [IU] via SUBCUTANEOUS

## 2018-09-07 MED ORDER — MAGIC MOUTHWASH
5.0000 mL | Freq: Four times a day (QID) | ORAL | Status: DC | PRN
  Administered 2018-09-07 – 2018-09-09 (×5): 5 mL via ORAL
  Filled 2018-09-07 (×9): qty 5

## 2018-09-07 MED ORDER — LACTULOSE 10 GM/15ML PO SOLN: 30.0000 g | Freq: Three times a day (TID) | ORAL | Status: DC

## 2018-09-07 NOTE — Progress Notes (Signed)
PHARMACY CONSULT NOTE FOR:  OUTPATIENT  PARENTERAL ANTIBIOTIC THERAPY (OPAT)  Indication: Streptococcal mandibular osteomyelitis  Regimen: Penicillin G IV  24 million units every 24 hours End date: 10/15/2018  IV antibiotic discharge orders are pended. To discharging provider:  please sign these orders via discharge navigator,  Select New Orders & click on the button choice - Manage This Unsigned Work.     Thank you for allowing pharmacy to be a part of this patient's care.  Jimmy Footman, PharmD, BCPS, BCIDP Infectious Diseases Clinical Pharmacist Phone: [phone] 09/07/2018, 10:19 AM

## 2018-09-07 NOTE — Progress Notes (Signed)
PICC line placed in patient's right upper arm by IV team. After placement, Zenia Resides, RN from IV team spoke with this RN at 2240 stating that the patient's PICC line was ready for use.

## 2018-09-07 NOTE — Progress Notes (Signed)
Patient ID: Adrienne [name], female   DOB: [birth year], 63 y.o.   MRN: [id number]  PROGRESS NOTE    Adrienne [name]  WVP:[id number] DOB: [birth year] DOA: 09/02/2018 PCP: Asencion Noble, MD   Brief Narrative:  63 year old female with COPD, diabetes mellitus type 2, hyperlipidemia, bipolar disorder, anxiety, depression and ADD?  Presented on 09/01/2020 [facility] with right jaw pain and swelling after recent dental extraction on 08/29/2018.  CT showed right jaw and neck abscess and cellulitis.  Patient had failed outpatient treatment with clindamycin.  She was transferred to [facility] for E[facility] evaluation.  She had I&D and Penrose drain placement by ENT on 09/03/2018.  Wound cultures grew Streptococcus group F.  ID was consulted.  She was switched to IV penicillin.  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  ENT following.  Still has Penrose drain with lots of purulence this morning.  Status post bedside washout done on 09/06/2018 by ENT.  ENT planning on removal of the drain on 09/09/2018. -Blood cultures negative so far.  Wound cultures grew few Streptococcus group F.  ID was consulted.  Currently on penicillin IV and ID recommends 6 weeks of intravenous penicillin till 10/15/2018. -PICC line placed on 09/06/2018. -Currently afebrile with improving white cell count.  Diabetes mellitus type 2 uncontrolled with hyperglycemia -A1c 8.8.  On metformin and glimepiride at home; on hold currently -Continue Lantus continue CBGs with SSI.  Bipolar  disorder/anxiety/depression -Continue home lithium, Xanax, Effexor and Ritalin    DVT prophylaxis: Subcutaneous Lovenox Code Status: Full Family Communication: None at bedside Disposition Plan: Home in 2 to 3 days if cleared by ENT  Consultants: ENT/ID  Procedures: I&D and Penrose drain placement on 09/03/2018  Antimicrobials:  Anti-infectives (From admission, onward)   Start     Dose/Rate Route Frequency Ordered Stop   09/06/18 1800  penicillin G potassium 12 Million Units in dextrose 5 % 500 mL continuous infusion     12 Million Units 41.7 mL/hr over 12 Hours Intravenous Every 12 hours 09/06/18 1557     09/05/18 1200  Ampicillin-Sulbactam (UNASYN) 3 g in sodium chloride 0.9 % 100 mL IVPB  Status:  Discontinued     3 g 200 mL/hr over 30 Minutes Intravenous Every 6 hours 09/05/18 1109 09/06/18 1557   09/02/18 2200  aztreonam (AZACTAM) 2 g in sodium chloride 0.9 % 100 mL IVPB  Status:  Discontinued     2 g 200 mL/hr over 30 Minutes Intravenous Every 8 hours 09/02/18 1536 09/02/18 1737   09/02/18 1745  clindamycin (CLEOCIN) IVPB 600 mg  Status:  Discontinued     600 mg 100 mL/hr over 30 Minutes Intravenous Every 8 hours 09/02/18 1737 09/05/18 1109   09/02/18 1745  levofloxacin (LEVAQUIN) IVPB 750 mg  Status:  Discontinued     750 mg 100 mL/hr over 90 Minutes Intravenous Every 24 hours 09/02/18 1737 09/05/18 0951   09/02/18 1530  aztreonam (AZACTAM) 2 g in sodium chloride 0.9 % 100 mL IVPB     2 g 200  mL/hr over 30 Minutes Intravenous  Once 09/02/18 1524 09/02/18 1808   09/02/18 1200  clindamycin (CLEOCIN) IVPB 900 mg     900 mg 100 mL/hr over 30 Minutes Intravenous  Once 09/02/18 1152 09/02/18 1400       Subjective: Patient seen and examined at bedside.  Denies any overnight fever, nausea or vomiting.  No worsening neck pain.  Objective: Vitals:   09/06/18 2040 09/07/18 0436 09/07/18 0658 09/07/18 0920  BP: (!) 139/96 (!) 150/80  105/68  Pulse: 69 67  77  Resp: 16 16   18   Temp: 99.2 F (37.3 C) 98.2 F (36.8 C)    TempSrc: Oral Oral    SpO2: 94% 92%  92%  Weight:   85.1 kg   Height:        Intake/Output Summary (Last 24 hours) at 09/07/2018 1150 Last data filed at 09/07/2018 0836 Gross per 24 hour  Intake 1011.91 ml  Output 1300 ml  Net -288.09 ml   Filed Weights   09/05/18 0444 09/06/18 0500 09/07/18 0658  Weight: 81.1 kg 82.8 kg 85.1 kg    Examination:  General exam: Appears calm and comfortable  ENT/neck: Dressing present on the neck.  Mild swelling and tenderness over right face. Respiratory system: Bilateral decreased breath sounds at bases Cardiovascular system: S1 & S2 heard, Rate controlled Gastrointestinal system: Abdomen is nondistended, soft and nontender. Normal bowel sounds heard. Extremities: No cyanosis, clubbing, edema   Data Reviewed: I have personally reviewed following labs and imaging studies  CBC: Recent Labs  Lab 09/02/18 1356 09/03/18 0816 09/04/18 0424 09/05/18 0343 09/06/18 0403 09/07/18 0439  WBC 24.2* 16.7* 17.4* 14.5* 12.1* 12.8*  NEUTROABS 20.1*  --   --   --   --   --   HGB 13.7 12.2 12.3 12.6 12.8 12.8  HCT 42.3 37.4 39.5 38.3 40.5 40.6  MCV 92.0 92.8 95.4 94.1 93.8 93.8  PLT 364 336 378 417* 430* 407*   Basic Metabolic Panel: Recent Labs  Lab 09/02/18 1356 09/05/18 0343 09/07/18 0439  NA 132* 137 137  K 4.2 3.9 3.8  CL 92* 99 100  CO2 28 27 27   GLUCOSE 300* 99 143*  BUN 28* 15 11  CREATININE 0.96 0.85 0.73  CALCIUM 9.3 8.7* 9.0   GFR: Estimated Creatinine Clearance: 73.5 mL/min (by C-G formula based on SCr of 0.73 mg/dL). Liver Function Tests: No results for input(s): AST, ALT, ALKPHOS, BILITOT, PROT, ALBUMIN in the last 168 hours. No results for input(s): LIPASE, AMYLASE in the last 168 hours. No results for input(s): AMMONIA in the last 168 hours. Coagulation Profile: No results for input(s): INR, PROTIME in the last 168 hours. Cardiac Enzymes: No results for input(s): CKTOTAL,  CKMB, CKMBINDEX, TROPONINI in the last 168 hours. BNP (last 3 results) No results for input(s): PROBNP in the last 8760 hours. HbA1C: No results for input(s): HGBA1C in the last 72 hours. CBG: Recent Labs  Lab 09/06/18 1639 09/06/18 2043 09/07/18 0034 09/07/18 0432 09/07/18 0816  GLUCAP 117* 229* 167* 125* 211*   Lipid Profile: No results for input(s): CHOL, HDL, LDLCALC, TRIG, CHOLHDL, LDLDIRECT in the last 72 hours. Thyroid Function Tests: No results for input(s): TSH, T4TOTAL, FREET4, T3FREE, THYROIDAB in the last 72 hours. Anemia Panel: No results for input(s): VITAMINB12, FOLATE, FERRITIN, TIBC, IRON, RETICCTPCT in the last 72 hours. Sepsis Labs: Recent Labs  Lab 09/02/18 1214 09/02/18 1356  LATICACIDVEN 2.5* 1.4    Recent Results (from the past 240  hour(s))  Blood Culture (routine x 2)     Status: None   Collection Time: 09/02/18 12:15 PM   Specimen: BLOOD LEFT ARM  Result Value Ref Range Status   Specimen Description BLOOD LEFT ARM  Final   Special Requests   Final    BOTTLES DRAWN AEROBIC AND ANAEROBIC Blood Culture adequate volume   Culture   Final    NO GROWTH 5 DAYS Performed at [facility], [address], [place], [place] [address]    Report Status 09/07/2018 FINAL  Final  SARS Coronavirus 2 (CEPHEID - Performed in [place] hospital lab), Hosp Order     Status: None   Collection Time: 09/02/18 12:16 PM   Specimen: Nasopharyngeal Swab  Result Value Ref Range Status   SARS Coronavirus 2 NEGATIVE NEGATIVE Final    Comment: (NOTE) If result is NEGATIVE SARS-CoV-2 target nucleic acids are NOT DETECTED. The SARS-CoV-2 RNA is generally detectable in upper and lower  respiratory specimens during the acute phase of infection. The lowest  concentration of SARS-CoV-2 viral copies this assay can detect is 250  copies / mL. A negative result does not preclude SARS-CoV-2 infection  and should not be used as the sole basis for treatment or other  patient  management decisions.  A negative result may occur with  improper specimen collection / handling, submission of specimen other  than nasopharyngeal swab, presence of viral mutation(s) within the  areas targeted by this assay, and inadequate number of viral copies  (<250 copies / mL). A negative result must be combined with clinical  observations, patient history, and epidemiological information. If result is POSITIVE SARS-CoV-2 target nucleic acids are DETECTED. The SARS-CoV-2 RNA is generally detectable in upper and lower  respiratory specimens dur ing the acute phase of infection.  Positive  results are indicative of active infection with SARS-CoV-2.  Clinical  correlation with patient history and other diagnostic information is  necessary to determine patient infection status.  Positive results do  not rule out bacterial infection or co-infection with other viruses. If result is PRESUMPTIVE POSTIVE SARS-CoV-2 nucleic acids MAY BE PRESENT.   A presumptive positive result was obtained on the submitted specimen  and confirmed on repeat testing.  While 2019 novel coronavirus  (SARS-CoV-2) nucleic acids may be present in the submitted sample  additional confirmatory testing may be necessary for epidemiological  and / or clinical management purposes  to differentiate between  SARS-CoV-2 and other Sarbecovirus currently known to infect humans.  If clinically indicated additional testing with an alternate test  methodology [phone]) is advised. The SARS-CoV-2 RNA is generally  detectable in upper and lower respiratory sp ecimens during the acute  phase of infection. The expected result is Negative. Fact Sheet for Patients:  StrictlyIdeas.no Fact Sheet for Healthcare Providers: BankingDealers.co.za This test is not yet approved or cleared by the [place] FDA and has been authorized for detection and/or diagnosis of SARS-CoV-2 by FDA under  an Emergency Use Authorization (EUA).  This EUA will remain in effect (meaning this test can be used) for the duration of the COVID-19 declaration under Section 564(b)(1) of the Act, 21 U.S.C. section 360bbb-3(b)(1), unless the authorization is terminated or revoked sooner. Performed at [facility], [address] [place], [place] [address]   Blood Culture (routine x 2)     Status: None   Collection Time: 09/02/18 12:23 PM   Specimen: BLOOD RIGHT ARM  Result Value Ref Range Status   Specimen Description BLOOD RIGHT  ARM  Final   Special Requests   Final    BOTTLES DRAWN AEROBIC AND ANAEROBIC Blood Culture results may not be optimal due to an excessive volume of blood received in culture bottles   Culture   Final    NO GROWTH 5 DAYS Performed at [facility], [address], [place], [place] [address]    Report Status 09/07/2018 FINAL  Final  Aerobic/Anaerobic Culture (surgical/deep wound)     Status: None (Preliminary result)   Collection Time: 09/03/18  4:37 PM   Specimen: Abscess  Result Value Ref Range Status   Specimen Description ABSCESS RIGHT NECK  Final   Special Requests PATIENT ON FOLLOWING CLEOCIN AZACTAM LEVAQUIN  Final   Gram Stain   Final    RARE WBC PRESENT, PREDOMINANTLY PMN FEW GRAM POSITIVE COCCI IN PAIRS Performed at [facility], [place] [address] [place], [place] [address]    Culture   Final    FEW STREPTOCOCCUS GROUP F NO ANAEROBES ISOLATED; CULTURE IN PROGRESS FOR 5 DAYS    Report Status PENDING  Incomplete         Radiology Studies: [place] Ekg Site Rite  Result Date: 09/06/2018 If Site Rite image not attached, placement could not be confirmed due to current cardiac rhythm.       Scheduled Meds: . insulin aspart  0-15 Units Subcutaneous Q4H  . insulin glargine  6 Units Subcutaneous Daily  . lithium carbonate  600 mg Oral QHS  . loratadine  10 mg Oral Daily  . methylphenidate  20 mg Oral TID WC  . pantoprazole  40 mg Oral Daily  .  rosuvastatin  20 mg Oral Daily  . sodium chloride flush  10-40 mL Intracatheter Q12H  . venlafaxine XR  150 mg Oral Q breakfast   Continuous Infusions: . lactated ringers 10 mL/hr at 09/03/18 1854  . penicillin g continuous IV infusion 12 Million Units (09/07/18 2248)     LOS: 5 days        Aline August, MD [facility] 09/07/2018, 11:50 AM

## 2018-09-07 NOTE — Progress Notes (Signed)
    [facility] for Infectious Disease  Date of Admission:  09/02/2018             Brief Progress Note:  Diagnosis: Mandibular abscess / osteomyelitis   Culture Result: Group F Streptococcus  Allergies  Allergen Reactions  . Codeine Other (See Comments)    REACTION: headaches  . Dilaudid [Hydromorphone Hcl] Itching  . Morphine And Related Itching and Other (See Comments)    Side effect  . Sulfonamide Derivatives Rash    OPAT Orders Discharge antibiotics:Penicillin Per pharmacy protocol  Aim for Vancomycin trough 15-20 (unless otherwise indicated) Duration: 6 weeks  End Date: 10/15/18  [facility] Care Per Protocol:  Labs weekly while on IV antibiotics: _X_ CBC with differential _X_ BMP __ CMP _X_ CRP _X_ ESR __ Vancomycin trough __ CK  _X_ Please pull PIC at completion of IV antibiotics __ Please leave PIC in place until doctor has seen patient or been notified  Fax weekly labs to [phone]  Clinic Follow Up Appt: 10/12/18 at 2:30pm with Terri Piedra, NP    Terri Piedra, [place] [facility] [place] [phone] Pager  09/07/2018  10:29 AM

## 2018-09-07 NOTE — Progress Notes (Signed)
   Subjective:    Patient ID: Adrienne [name], female    DOB: [birth year], 63 y.o.   MRN: [id number]  Pain improved, swelling improved, had lots of drainage from the Penrose in her right neck overnight. On IV Unasyn Infectious Disease weighing in, had PEG placed yesterday Tolerating diet, secretions, lying flat easily.  Says she feels better this morning.  No fevers.  Feels a little better but still having pretty bad pain that comes and goes.  Review of Systems  Constitutional: Negative for activity change and appetite change.  HENT: Positive for dental problem. Negative for congestion.   Respiratory: Negative for chest tightness.        Objective:   Physical Exam Vitals signs reviewed.  Constitutional:      General: She is not in acute distress.    Appearance: Normal appearance.  HENT:     Head: Normocephalic.     Nose: Nose normal.     Mouth/Throat:     Mouth: Mucous membranes are moist.     Comments: Floor of mouth is soft +1.5cm trismus, unchanged No purulence intraorally  Eyes:     Extraocular Movements: Extraocular movements intact.     Conjunctiva/sclera: Conjunctivae normal.     Pupils: Pupils are equal, round, and reactive to light.  Neck:     Comments: Swelling improved.  Erythema improved throughout the neck.  The Penrose has copious amounts of purulence on the fluff drainage today.  No more pitting edema though she is still quite edematous. Cardiovascular:     Rate and Rhythm: Normal rate.  Pulmonary:     Effort: Pulmonary effort is normal.  Abdominal:     General: Abdomen is flat.     Palpations: Abdomen is soft.  Neurological:     Mental Status: She is alert.      WBC improving. Afebrile.   Cultures:  RARE WBC PRESENT, PREDOMINANTLY PMN  FEW GRAM POSITIVE COCCI IN PAIRS  Performed at [facility], [place] [address] [place], [place] [address]    Culture FEW STREPTOCOCCUS GROUP F  NO ANAEROBES ISOLATED; CULTURE IN PROGRESS FOR 5 DAYS         Assessment & Plan:  Right submandibular odontogenic abscess s/p I&D 09/04/2018.  Bedside washout done 09/06/2018.  Expect slow progress.  Penrose drain in place and with a lot of purulence this morning.  Will leave Penrose in place for a couple more days-plan for removal of Penrose drain 09/09/2018.  At this time she may be able to switch to packing..   In surgery, she had some exposed mandibular bone at the right inferior alveolar ridge where teeth had been extracted.   There is some persistent trismus, I expect to be slow to improve.   -Continue dressing changes at least BID. Wear neck dressing loosely to allow for egress of fluid -Encouraged patient to continue massage and expression of purulence inferiorly.  -Defer to ID for abx management and PICC placement  -Will follow.  Plan for removal of Penrose 09/09/2018 and switching to packing.  Likely home after that if okay per primary team.   Helayne Seminole, MD  [facility], [place] Office phone [phone]

## 2018-09-08 LAB — CBC
HCT: 40.2 % (ref 36.0–46.0)
Hemoglobin: 12.9 g/dL (ref 12.0–15.0)
MCH: 30 pg (ref 26.0–34.0)
MCHC: 32.1 g/dL (ref 30.0–36.0)
MCV: 93.5 fL (ref 80.0–100.0)
Platelets: 461 10*3/uL — ABNORMAL HIGH (ref 150–400)
RBC: 4.3 MIL/uL (ref 3.87–5.11)
RDW: 13.5 % (ref 11.5–15.5)
WBC: 12.7 10*3/uL — ABNORMAL HIGH (ref 4.0–10.5)
nRBC: 0 % (ref 0.0–0.2)

## 2018-09-08 LAB — GLUCOSE, CAPILLARY
Glucose-Capillary: 234 mg/dL — ABNORMAL HIGH (ref 70–99)
Glucose-Capillary: 230 mg/dL — ABNORMAL HIGH (ref 70–99)
Glucose-Capillary: 188 mg/dL — ABNORMAL HIGH (ref 70–99)
Glucose-Capillary: 152 mg/dL — ABNORMAL HIGH (ref 70–99)

## 2018-09-08 LAB — AEROBIC/ANAEROBIC CULTURE W GRAM STAIN (SURGICAL/DEEP WOUND)

## 2018-09-08 LAB — BASIC METABOLIC PANEL
Anion gap: 9 (ref 5–15)
BUN: 11 mg/dL (ref 8–23)
CO2: 27 mmol/L (ref 22–32)
Calcium: 8.9 mg/dL (ref 8.9–10.3)
Chloride: 102 mmol/L (ref 98–111)
Creatinine, Ser: 0.71 mg/dL (ref 0.44–1.00)
GFR calc Af Amer: >60 mL/min (ref >60)
GFR calc non Af Amer: >60 mL/min (ref >60)
Glucose, Bld: 153 mg/dL — ABNORMAL HIGH (ref 70–99)
Potassium: 4.3 mmol/L (ref 3.5–5.1)
Sodium: 138 mmol/L (ref 135–145)

## 2018-09-08 LAB — MAGNESIUM: Magnesium: 1.9 mg/dL (ref 1.7–2.4)

## 2018-09-08 MED ORDER — CHLORHEXIDINE GLUCONATE CLOTH 2 % EX PADS
6.0000 | MEDICATED_PAD | Freq: Every day | CUTANEOUS | Status: DC
  Administered 2018-09-08: 6 via TOPICAL

## 2018-09-08 MED ORDER — PENICILLIN G POTASSIUM IV (FOR PTA / DISCHARGE USE ONLY): 24.0000 10*6.[IU] | INTRAVENOUS | 0 refills | Status: DC

## 2018-09-08 NOTE — Plan of Care (Signed)
  Problem: Education: Goal: Knowledge of General Education information will improve Description: Including pain rating scale, medication(s)/side effects and non-pharmacologic comfort measures Outcome: Progressing   Problem: Clinical Measurements: Goal: Will remain free from infection Outcome: Progressing Goal: Diagnostic test results will improve Outcome: Progressing   Problem: Coping: Goal: Level of anxiety will decrease Outcome: Progressing   Problem: Pain Managment: Goal: General experience of comfort will improve Outcome: Progressing   Problem: Safety: Goal: Ability to remain free from injury will improve Outcome: Progressing

## 2018-09-08 NOTE — Evaluation (Signed)
Physical Therapy Evaluation and Discharge Patient Details Name: Adrienne [name] MRN: [id number] DOB: 06-29-1955 Today's Date: 09/08/2018   History of Present Illness  63 year old female with COPD, diabetes mellitus type 2, hyperlipidemia, bipolar disorder, anxiety, depression and ADD?  Presented on 09/01/2020 [facility] with right jaw pain and swelling after recent dental extraction on 08/29/2018.  CT showed right jaw and neck abscess and cellulitis. She had I&D and Penrose drain placement by ENT on 09/03/2018.     Clinical Impression  Patient evaluated by Physical Therapy with no further PT needs identified. Patient is independent with all mobility and showed no signs of imbalance with balance assessment. PT is signing off. Thank you for this referral.     Follow Up Recommendations No PT follow up    Equipment Recommendations  None recommended by PT    Recommendations for Other Services       Precautions / Restrictions Precautions Precautions: None      Mobility  Bed Mobility Overal bed mobility: Independent                Transfers Overall transfer level: Independent                  Ambulation/Gait Ambulation/Gait assistance: Independent Gait Distance (Feet): 30 Feet Assistive device: None Gait Pattern/deviations: WFL(Within Functional Limits)     General Gait Details: no evidence of imbalance and pt denies h/o imbalance  Stairs            Wheelchair Mobility    Modified Rankin (Stroke Patients Only)       Balance Overall balance assessment: Independent                     Tandem Stance - Left Leg: 20(widened stance) Rhomberg - Eyes Opened: 30 Rhomberg - Eyes Closed: 30 High level balance activites: Turns;Head turns High Level Balance Comments: able to reach to floor, able to turn 360 either direction with no imbalance             Pertinent Vitals/Pain Pain Assessment: Faces Faces Pain Scale: Hurts a little  bit Pain Location: rt cheek and neck Pain Descriptors / Indicators: Tightness Pain Intervention(s): Monitored during session    Home Living Family/patient expects to be discharged to:: Private residence Living Arrangements: Alone Available Help at Discharge: Family;Available PRN/intermittently(mother lives nearby) Type of Home: House Home Access: Stairs to enter   [employer] of Steps: 2 Home Layout: Two level;1/2 bath on main level Home Equipment: Walker - 2 wheels;Cane - single point;Shower seat      Prior Function Level of Independence: Independent               Hand Dominance        Extremity/Trunk Assessment   Upper Extremity Assessment Upper Extremity Assessment: Overall WFL for tasks assessed    Lower Extremity Assessment Lower Extremity Assessment: Overall WFL for tasks assessed    Cervical / Trunk Assessment Cervical / Trunk Assessment: Normal  Communication   Communication: No difficulties  Cognition Arousal/Alertness: Awake/alert Behavior During Therapy: WFL for tasks assessed/performed Overall Cognitive Status: Within Functional Limits for tasks assessed                                        General Comments      Exercises     Assessment/Plan    PT Assessment Patent does  not need any further PT services  PT Problem List         PT Treatment Interventions      PT Goals (Current goals can be found in the Care Plan section)  Acute Rehab PT Goals PT Goal Formulation: All assessment and education complete, DC therapy    Frequency     Barriers to discharge        Co-evaluation               AM-PAC PT "6 Clicks" Mobility  Outcome Measure Help needed turning from your back to your side while in a flat bed without using bedrails?: None Help needed moving from lying on your back to sitting on the side of a flat bed without using bedrails?: None Help needed moving to and from a bed to a chair  (including a wheelchair)?: None Help needed standing up from a chair using your arms (e.g., wheelchair or bedside chair)?: None Help needed to walk in hospital room?: None Help needed climbing 3-5 steps with a railing? : None 6 Click Score: 24    End of Session   Activity Tolerance: Patient tolerated treatment well Patient left: in bed;with call bell/phone within reach Nurse Communication: Mobility status;Other (comment)(d/c from PT, no needs) PT Visit Diagnosis: Other abnormalities of gait and mobility (R26.89)    Time: 1610-9604 PT Time Calculation (min) (ACUTE ONLY): 12 min   Charges:   PT Evaluation $PT Eval Low Complexity: 1 Low            [employer], PT 09/08/2018, 4:24 PM

## 2018-09-08 NOTE — Progress Notes (Signed)
ID Pharmacy Note- Allergy Evaluation     Ms. Hutsell has a penicillin allergy noted on the beginning of this admission.   Upon further interview, it was discovered that the allergy was about 27 years ago and at first she noted a rash. On further discussion she revealed that she just had thrush with amoxicillin which is a side effect and not an allergy.    Given this information we challenged her with Unasyn from 6/29 to 6/30- She tolerated 3 doses of this with no issues.    She was narrowed to penicillin IV infusion on 6/30 and continues on this medication.   She had tolerated these medications well so her penicillin allergy was taken out of her chart.    Jimmy Footman, PharmD, BCPS, BCIDP Infectious Diseases Clinical Pharmacist Phone: [phone] 09/08/2018 3:02 PM

## 2018-09-08 NOTE — TOC Initial Note (Addendum)
Transition of Care [facility]) - Initial/Assessment Note    Patient Details  Name: Adrienne [name] MRN: [id number] Date of Birth: 1955/05/02  Transition of Care [facility]) CM/SW Contact:    Sharin Mons, RN Phone Number: 09/08/2018, 10:15 AM  Clinical Narrative: Admitted with Mandibular abscess / osteomyelitis. Pt from home alone. Supportive mom. PTA independent with ADL's, no DME usage. Pt will need LT  IV ABX for 6 weeks, end date 10/15/2018. Referral made with [facility] and  [facility] for home IV infusion services.  Pt states has transportation to home when d/c.  NCM will continue to monitor for TOC needs.   Expected Discharge Plan: [place] Barriers to Discharge: Continued Medical Work up   Patient Goals and CMS Choice Patient states their goals for this hospitalization and ongoing recovery are:: to get this infection out of me CMS Medicare.gov Compare Post Acute Care list provided to:: Patient Choice offered to / list presented to : Patient  Expected Discharge Plan and Services Expected Discharge Plan: [place]   Discharge Planning Services: CM Consult Post Acute Care Choice: [place] arrangements for the past 2 months: Single Family Home                 DME Arranged: IV pump/equipment(IV ABX therapy/ [facility]) DME Agency: Other - Comment([facility]) Date DME Agency Contacted: 09/08/18 Time DME Agency Contacted: 1004 Representative spoke with at DME Agency: [place]: RN [place] Agency: [place] ([place]) Date [place]: 09/08/18 Time [place]: 1005 Representative spoke with at [place]: Butch Penny ( voice message)  Prior Living Arrangements/Services Living arrangements for the past 2 months: [place] Lives with:: Self Patient language and need for interpreter reviewed:: Yes Do you feel safe going back to the place where you live?: Yes       Need for Family Participation in Patient Care: No (Comment) Care giver support system in place?: No (comment)   Criminal Activity/Legal Involvement Pertinent to Current Situation/Hospitalization: No - Comment as needed  Activities of Daily Living Home Assistive Devices/Equipment: Eyeglasses, Walker (specify type) ADL Screening (condition at time of admission) Patient's cognitive ability adequate to safely complete daily activities?: Yes Is the patient deaf or have difficulty hearing?: No Does the patient have difficulty seeing, even when wearing glasses/contacts?: No Does the patient have difficulty concentrating, remembering, or making decisions?: No Patient able to express need for assistance with ADLs?: Yes Does the patient have difficulty dressing or bathing?: No Independently performs ADLs?: Yes (appropriate for developmental age) Does the patient have difficulty walking or climbing stairs?: No Weakness of Legs: None Weakness of Arms/Hands: None  Permission Sought/Granted Permission sought to share information with : Case Manager, Family Supports Permission granted to share information with : Yes, Verbal Permission Granted  Share Information with NAME: Adrienne [name] (Mother)     Permission granted to share info w Relationship: mother  Permission granted to share info w Contact Information: [phone]  Emotional Assessment Appearance:: Appears stated age Attitude/Demeanor/Rapport: Gracious   Orientation: : Oriented to Self, Oriented to Place, Oriented to  Time, Oriented to Situation Alcohol / Substance Use: Not Applicable Psych Involvement: No (comment)  Admission diagnosis:  Submandibular abscess [K12.2] Other acute osteomyelitis, other site ([place]) [M86.18] Sepsis, due to unspecified organism, unspecified whether acute organ dysfunction present [facility]) [A41.9] Patient Active Problem List   Diagnosis Date Noted  . Streptococcal infection group G   .  Pyogenic inflammation  of bone ([place])   . Poor dentition   . Allergy to antibacterial drug   . Mandibular abscess 09/02/2018  . Hypertension   . Hyperlipidemia associated with type 2 diabetes mellitus ([place])   . Diarrhea 11/25/2017  . Nausea with vomiting 11/25/2017  . Abdominal distention 11/25/2017  . Fecal smearing 07/08/2017  . Status post bilateral total hip replacement 08/27/2016  . Emphysema of lung ([place]) 05/22/2016  . Atherosclerosis of aorta ([place]) 05/22/2016  . GAD (generalized anxiety disorder) 05/22/2016  . Mucosal abnormality of stomach   . Bipolar 1 disorder ([place]) 05/15/2014  . History of smoking 30 or more pack years 05/15/2014  . Barrett's esophagus 04/03/2010  . SHOULDER, ARTHRITIS, DEGEN./OSTEO 03/06/2009  . RUPTURE ROTATOR CUFF 03/06/2009  . Type 2 diabetes mellitus without complications ([place]) 24/82/5003  . H N P-LUMBAR 06/29/2007  . RESTLESS LEG SYNDROME 05/18/2007  . SLEEP APNEA 05/18/2007  . Headache(784.0) 05/18/2007  . SCIATICA 03/31/2007  . Anxiety and depression 03/28/2007  . MACULAR DEGENERATION 03/28/2007  . ALLERGIC RHINITIS 03/28/2007  . GERD 03/28/2007  . LOW BACK PAIN 03/28/2007  . SEIZURE DISORDER 03/28/2007  . HEAD TRAUMA, CLOSED 03/28/2007   PCP:  Asencion Noble, MD Pharmacy:   [place], [place] [address] [place] [address] Phone: [phone] Fax: [phone]     Social Determinants of Health (SDOH) Interventions    Readmission Risk Interventions No flowsheet data found.

## 2018-09-08 NOTE — Progress Notes (Signed)
Patient ID: Adrienne [name], female   DOB: [birth year], 64 y.o.   MRN: [id number]  PROGRESS NOTE    Adrienne [name]  NWG:[id number] DOB: 1956-01-24 DOA: 09/02/2018 PCP: Asencion Noble, MD   Brief Narrative:  63 year old female with COPD, diabetes mellitus type 2, hyperlipidemia, bipolar disorder, anxiety, depression and ADD?  Presented on 09/01/2020 [facility] with right jaw pain and swelling after recent dental extraction on 08/29/2018.  CT showed right jaw and neck abscess and cellulitis.  Patient had failed outpatient treatment with clindamycin.  She was transferred to [facility] for [facility] evaluation.  She had I&D and Penrose drain placement by ENT on 09/03/2018.  Wound cultures grew Streptococcus group F.  ID was consulted.  She was switched to IV penicillin.  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  ENT following.  Still has Penrose drain with lots of purulence this morning.  Status post bedside washout done on 09/06/2018 by ENT.  ENT planning on removal of the drain on 09/09/2018.  Patient complains of increasing drainage from the wound and some increased sharp pain. -Blood cultures negative so far.  Wound cultures grew few Streptococcus group F.  ID was consulted.  Currently on penicillin IV and ID recommends 6 weeks of intravenous penicillin till 10/15/2018. -PICC line placed on 09/06/2018. -Currently afebrile with improving white cell count.  Diabetes mellitus type 2 uncontrolled with hyperglycemia -A1c 8.8.  On metformin and  glimepiride at home; on hold currently -Continue Lantus continue CBGs with SSI.  Bipolar disorder/anxiety/depression -Continue home lithium, Xanax, Effexor and Ritalin    DVT prophylaxis: Subcutaneous Lovenox Code Status: Full Family Communication: None at bedside Disposition Plan: Home in 2 to 3 days if cleared by ENT  Consultants: ENT/ID  Procedures: I&D and Penrose drain placement on 09/03/2018  Antimicrobials:  Anti-infectives (From admission, onward)   Start     Dose/Rate Route Frequency Ordered Stop   09/08/18 0000  penicillin G IVPB     24 Million Units Intravenous Every 24 hours 09/08/18 1031 10/16/18 2359   09/06/18 1800  penicillin G potassium 12 Million Units in dextrose 5 % 500 mL continuous infusion     12 Million Units 41.7 mL/hr over 12 Hours Intravenous Every 12 hours 09/06/18 1557     09/05/18 1200  Ampicillin-Sulbactam (UNASYN) 3 g in sodium chloride 0.9 % 100 mL IVPB  Status:  Discontinued     3 g 200 mL/hr over 30 Minutes Intravenous Every 6 hours 09/05/18 1109 09/06/18 1557   09/02/18 2200  aztreonam (AZACTAM) 2 g in sodium chloride 0.9 % 100 mL IVPB  Status:  Discontinued     2 g 200 mL/hr over 30 Minutes Intravenous Every 8 hours 09/02/18 1536 09/02/18 1737   09/02/18 1745  clindamycin (CLEOCIN) IVPB 600 mg  Status:  Discontinued     600 mg 100 mL/hr over 30 Minutes Intravenous Every 8 hours 09/02/18 1737 09/05/18 1109   09/02/18 1745  levofloxacin (LEVAQUIN) IVPB 750 mg  Status:  Discontinued     750 mg  100 mL/hr over 90 Minutes Intravenous Every 24 hours 09/02/18 1737 09/05/18 0951   09/02/18 1530  aztreonam (AZACTAM) 2 g in sodium chloride 0.9 % 100 mL IVPB     2 g 200 mL/hr over 30 Minutes Intravenous  Once 09/02/18 1524 09/02/18 1808   09/02/18 1200  clindamycin (CLEOCIN) IVPB 900 mg     900 mg 100 mL/hr over 30 Minutes Intravenous  Once 09/02/18 1152 09/02/18 1400       Subjective: Patient seen and examined at bedside.  Complains of  increasing drainage from the wound with slightly more jaw pain.  No overnight fever or vomiting. Objective: Vitals:   09/07/18 0920 09/07/18 2129 09/08/18 0500 09/08/18 0518  BP: 105/68 131/78  (!) 130/95  Pulse: 77 (!) 55  (!) 57  Resp: 18     Temp:  99.7 F (37.6 C)  98.4 F (36.9 C)  TempSrc:  Oral  Oral  SpO2: 92% 93%  91%  Weight:   80.7 kg   Height:        Intake/Output Summary (Last 24 hours) at 09/08/2018 1033 Last data filed at 09/08/2018 0200 Gross per 24 hour  Intake -  Output 2050 ml  Net -2050 ml   Filed Weights   09/06/18 0500 09/07/18 0658 09/08/18 0500  Weight: 82.8 kg 85.1 kg 80.7 kg    Examination:  General exam: Appears calm and comfortable; no distress ENT/neck: Dressing present on the neck.  Mild swelling and tenderness over right face. Respiratory system: Bilateral decreased breath sounds at bases.  No wheezing Cardiovascular system: S1 & S2 heard, intermittent bradycardia Gastrointestinal system: Abdomen is nondistended, soft and nontender. Normal bowel sounds heard. Extremities: No cyanosis, edema   Data Reviewed: I have personally reviewed following labs and imaging studies  CBC: Recent Labs  Lab 09/02/18 1356  09/04/18 0424 09/05/18 0343 09/06/18 0403 09/07/18 0439 09/08/18 0305  WBC 24.2*   < > 17.4* 14.5* 12.1* 12.8* 12.7*  NEUTROABS 20.1*  --   --   --   --   --   --   HGB 13.7   < > 12.3 12.6 12.8 12.8 12.9  HCT 42.3   < > 39.5 38.3 40.5 40.6 40.2  MCV 92.0   < > 95.4 94.1 93.8 93.8 93.5  PLT 364   < > 378 417* 430* 447* 461*   < > = values in this interval not displayed.   Basic Metabolic Panel: Recent Labs  Lab 09/02/18 1356 09/05/18 0343 09/07/18 0439 09/08/18 0305  NA 132* 137 137 138  K 4.2 3.9 3.8 4.3  CL 92* 99 100 102  CO2 28 27 27 27   GLUCOSE 300* 99 143* 153*  BUN 28* 15 11 11   CREATININE 0.96 0.85 0.73 0.71  CALCIUM 9.3 8.7* 9.0 8.9  MG  --   --   --  1.9   GFR: Estimated Creatinine Clearance: 71.4 mL/min  (by C-G formula based on SCr of 0.71 mg/dL). Liver Function Tests: No results for input(s): AST, ALT, ALKPHOS, BILITOT, PROT, ALBUMIN in the last 168 hours. No results for input(s): LIPASE, AMYLASE in the last 168 hours. No results for input(s): AMMONIA in the last 168 hours. Coagulation Profile: No results for input(s): INR, PROTIME in the last 168 hours. Cardiac Enzymes: No results for input(s): CKTOTAL, CKMB, CKMBINDEX, TROPONINI in the last 168 hours. BNP (last 3 results) No results for input(s): PROBNP in the last 8760 hours. HbA1C: No results for input(s): HGBA1C in  the last 72 hours. CBG: Recent Labs  Lab 09/07/18 0816 09/07/18 1225 09/07/18 1609 09/07/18 2127 09/08/18 0902  GLUCAP 211* 174* 130* 218* 234*   Lipid Profile: No results for input(s): CHOL, HDL, LDLCALC, TRIG, CHOLHDL, LDLDIRECT in the last 72 hours. Thyroid Function Tests: No results for input(s): TSH, T4TOTAL, FREET4, T3FREE, THYROIDAB in the last 72 hours. Anemia Panel: No results for input(s): VITAMINB12, FOLATE, FERRITIN, TIBC, IRON, RETICCTPCT in the last 72 hours. Sepsis Labs: Recent Labs  Lab 09/02/18 1214 09/02/18 1356  LATICACIDVEN 2.5* 1.4    Recent Results (from the past 240 hour(s))  Blood Culture (routine x 2)     Status: None   Collection Time: 09/02/18 12:15 PM   Specimen: BLOOD LEFT ARM  Result Value Ref Range Status   Specimen Description BLOOD LEFT ARM  Final   Special Requests   Final    BOTTLES DRAWN AEROBIC AND ANAEROBIC Blood Culture adequate volume   Culture   Final    NO GROWTH 5 DAYS Performed at [facility], [address], [place], [place] [address]    Report Status 09/07/2018 FINAL  Final  SARS Coronavirus 2 (CEPHEID - Performed in [place] hospital lab), Hosp Order     Status: None   Collection Time: 09/02/18 12:16 PM   Specimen: Nasopharyngeal Swab  Result Value Ref Range Status   SARS Coronavirus 2 NEGATIVE NEGATIVE Final    Comment: (NOTE) If result is  NEGATIVE SARS-CoV-2 target nucleic acids are NOT DETECTED. The SARS-CoV-2 RNA is generally detectable in upper and lower  respiratory specimens during the acute phase of infection. The lowest  concentration of SARS-CoV-2 viral copies this assay can detect is 250  copies / mL. A negative result does not preclude SARS-CoV-2 infection  and should not be used as the sole basis for treatment or other  patient management decisions.  A negative result may occur with  improper specimen collection / handling, submission of specimen other  than nasopharyngeal swab, presence of viral mutation(s) within the  areas targeted by this assay, and inadequate number of viral copies  (<250 copies / mL). A negative result must be combined with clinical  observations, patient history, and epidemiological information. If result is POSITIVE SARS-CoV-2 target nucleic acids are DETECTED. The SARS-CoV-2 RNA is generally detectable in upper and lower  respiratory specimens dur ing the acute phase of infection.  Positive  results are indicative of active infection with SARS-CoV-2.  Clinical  correlation with patient history and other diagnostic information is  necessary to determine patient infection status.  Positive results do  not rule out bacterial infection or co-infection with other viruses. If result is PRESUMPTIVE POSTIVE SARS-CoV-2 nucleic acids MAY BE PRESENT.   A presumptive positive result was obtained on the submitted specimen  and confirmed on repeat testing.  While 2019 novel coronavirus  (SARS-CoV-2) nucleic acids may be present in the submitted sample  additional confirmatory testing may be necessary for epidemiological  and / or clinical management purposes  to differentiate between  SARS-CoV-2 and other Sarbecovirus currently known to infect humans.  If clinically indicated additional testing with an alternate test  methodology [phone]) is advised. The SARS-CoV-2 RNA is generally  detectable  in upper and lower respiratory sp ecimens during the acute  phase of infection. The expected result is Negative. Fact Sheet for Patients:  StrictlyIdeas.no Fact Sheet for Healthcare Providers: BankingDealers.co.za This test is not yet approved or cleared by the [place] FDA and has been  authorized for detection and/or diagnosis of SARS-CoV-2 by FDA under an Emergency Use Authorization (EUA).  This EUA will remain in effect (meaning this test can be used) for the duration of the COVID-19 declaration under Section 564(b)(1) of the Act, 21 U.S.C. section 360bbb-3(b)(1), unless the authorization is terminated or revoked sooner. Performed at [facility], [address], [place], [place] [address]   Blood Culture (routine x 2)     Status: None   Collection Time: 09/02/18 12:23 PM   Specimen: BLOOD RIGHT ARM  Result Value Ref Range Status   Specimen Description BLOOD RIGHT ARM  Final   Special Requests   Final    BOTTLES DRAWN AEROBIC AND ANAEROBIC Blood Culture results may not be optimal due to an excessive volume of blood received in culture bottles   Culture   Final    NO GROWTH 5 DAYS Performed at [facility], [address] [place], [place] [address]    Report Status 09/07/2018 FINAL  Final  Aerobic/Anaerobic Culture (surgical/deep wound)     Status: None (Preliminary result)   Collection Time: 09/03/18  4:37 PM   Specimen: Abscess  Result Value Ref Range Status   Specimen Description ABSCESS RIGHT NECK  Final   Special Requests PATIENT ON FOLLOWING CLEOCIN AZACTAM LEVAQUIN  Final   Gram Stain   Final    RARE WBC PRESENT, PREDOMINANTLY PMN FEW GRAM POSITIVE COCCI IN PAIRS Performed at [facility], [place] [address], [place], [place] [address]    Culture   Final    FEW STREPTOCOCCUS GROUP F NO ANAEROBES ISOLATED; CULTURE IN PROGRESS FOR 5 DAYS    Report Status PENDING  Incomplete         Radiology Studies: [place] Ekg  Site Rite  Result Date: 09/06/2018 If Site Rite image not attached, placement could not be confirmed due to current cardiac rhythm.       Scheduled Meds: . Chlorhexidine Gluconate Cloth  6 each Topical Q0600  . insulin aspart  0-15 Units Subcutaneous TID WC  . insulin glargine  6 Units Subcutaneous Daily  . lithium carbonate  600 mg Oral QHS  . loratadine  10 mg Oral Daily  . methylphenidate  20 mg Oral TID WC  . pantoprazole  40 mg Oral Daily  . rosuvastatin  20 mg Oral Daily  . sodium chloride flush  10-40 mL Intracatheter Q12H  . venlafaxine XR  150 mg Oral Q breakfast   Continuous Infusions: . lactated ringers 10 mL/hr at 09/03/18 1854  . penicillin g continuous IV infusion 12 Million Units (09/08/18 3428)     LOS: 6 days        Aline August, MD [facility] 09/08/2018, 10:33 AM

## 2018-09-09 DIAGNOSIS — M869 Osteomyelitis, unspecified: Secondary | ICD-10-CM

## 2018-09-09 LAB — CBC WITH DIFFERENTIAL/PLATELET
Abs Immature Granulocytes: 0.19 10*3/uL — ABNORMAL HIGH (ref 0.00–0.07)
Basophils Absolute: 0.1 10*3/uL (ref 0.0–0.1)
Basophils Relative: 1 %
Eosinophils Absolute: 0.4 10*3/uL (ref 0.0–0.5)
Eosinophils Relative: 3 %
HCT: 41.7 % (ref 36.0–46.0)
Hemoglobin: 13.1 g/dL (ref 12.0–15.0)
Immature Granulocytes: 2 %
Lymphocytes Relative: 31 %
Lymphs Abs: 3.6 10*3/uL (ref 0.7–4.0)
MCH: 29.8 pg (ref 26.0–34.0)
MCHC: 31.4 g/dL (ref 30.0–36.0)
MCV: 95 fL (ref 80.0–100.0)
Monocytes Absolute: 0.9 10*3/uL (ref 0.1–1.0)
Monocytes Relative: 7 %
Neutro Abs: 6.6 10*3/uL (ref 1.7–7.7)
Neutrophils Relative %: 56 %
Platelets: 469 10*3/uL — ABNORMAL HIGH (ref 150–400)
RBC: 4.39 MIL/uL (ref 3.87–5.11)
RDW: 13.5 % (ref 11.5–15.5)
WBC: 11.7 10*3/uL — ABNORMAL HIGH (ref 4.0–10.5)
nRBC: 0 % (ref 0.0–0.2)

## 2018-09-09 LAB — BASIC METABOLIC PANEL
Anion gap: 11 (ref 5–15)
BUN: 8 mg/dL (ref 8–23)
CO2: 23 mmol/L (ref 22–32)
Calcium: 9 mg/dL (ref 8.9–10.3)
Chloride: 102 mmol/L (ref 98–111)
Creatinine, Ser: 0.69 mg/dL (ref 0.44–1.00)
GFR calc Af Amer: >60 mL/min (ref >60)
GFR calc non Af Amer: >60 mL/min (ref >60)
Glucose, Bld: 150 mg/dL — ABNORMAL HIGH (ref 70–99)
Potassium: 4.3 mmol/L (ref 3.5–5.1)
Sodium: 136 mmol/L (ref 135–145)

## 2018-09-09 LAB — GLUCOSE, CAPILLARY
Glucose-Capillary: 135 mg/dL — ABNORMAL HIGH (ref 70–99)
Glucose-Capillary: 187 mg/dL — ABNORMAL HIGH (ref 70–99)

## 2018-09-09 LAB — C-REACTIVE PROTEIN: CRP: 1.5 mg/dL — ABNORMAL HIGH (ref <1.0)

## 2018-09-09 LAB — MAGNESIUM: Magnesium: 1.9 mg/dL (ref 1.7–2.4)

## 2018-09-09 MED ORDER — ALPRAZOLAM 1 MG PO TABS: 1.0000 mg | ORAL_TABLET | ORAL | Status: AC

## 2018-09-09 MED ORDER — TRAMADOL HCL 50 MG PO TABS: 50.0000 mg | ORAL_TABLET | Freq: Four times a day (QID) | ORAL | 0 refills | Status: DC | PRN

## 2018-09-09 NOTE — Discharge Instructions (Signed)
-Stay on a soft diet for at least 2 weeks.     Wound Packing Wound packing usually involves placing a moistened packing material into your wound and then covering it with an outer bandage (dressing). This helps promote proper healing of deep tissue and tissue under the skin. It also helps prevent bleeding, infection, and further injury. Wounds are packed until deep tissues heal. The time it takes for this to occur is different for everyone. Your health care provider will show you how to pack and dress your wound. Using gloves and a clean technique is important in order to avoid spreading germs into your wound. Supplies needed:  Soap and water.  Disposable gloves.  Wetting solution.  Clean bowl.  Clean packing material (gauze or gauze sponges).  Clean paper towels.  Outer dressing.  Tape.  Cotton-tipped swabs.  Small plastic bag. How to pack your wound Follow your health care provider's instructions on how often you need to change dressings and pack your wound. You will likely be asked to change dressings 1-2 times a day. Preparing the new packing material  1. Clean and disinfect your work surface or countertop. 2. Set a plastic bag on or near your work surface. 3. Wash your hands well with soap and water. 4. Put a clean paper towel on the counter. 5. Put a clean bowl on the towel. Be sure to only touch the outside of the bowl when handling it. 6. Pour wetting solution into the bowl. 7. Cut your packing material (gauze or sponges) to the right size for your wound. Drop it into the bowl. 8. Cut 4 tape strips that you will use to seal the outer dressing. 9. Put cotton-tipped swabs on the clean paper towel. Removing the old packing material and dressing 1. Put on a set of gloves. 2. Gently remove the old dressing and packing material. 3. Remove your gloves. 4. Put the removed items, including the gloves, into the plastic bag to throw away later. 5. Wash your hands well with  soap and water again. Applying the new packing material and dressing 1. Put on a new set of gloves. 2. Squeeze the packing material in the bowl to release the extra liquid. The packing material should be moist, but not dripping wet. 3. Gently place the packing material into the wound. Use a cotton-tipped swab to guide it into place, filling all of the space. 4. Dry your gloved fingertips on the paper towel. 5. Open up your outer dressing supplies and put them on a dry part of the paper towel. Keep them from getting wet. 6. Place the outer dressing over the packed wound. 7. Tape the 4 outer edges of the outer dressing in place. 8. Remove your gloves. 9. Wash your hands again with soap and water. 10. Clean and disinfect your work surface or countertop. General tips  Follow your health care provider's instructions on how much to pack the wound. At first, you may need to pack it more tightly to help stop bleeding. As the wound begins to heal inside, you will use less packing material and pack the wound loosely to allow tissue to heal slowly from the inside out.  Keep the dressing clean and dry.  Follow any other instructions given by your health care provider on how to aid healing. This may include applying warm or cold compresses, raising (elevating) the affected area, or wearing a compression dressing.  Check your wound site every day for signs of infection. Check for: ?  More redness, swelling, or pain. ? More fluid or blood. ? Warmth. ? Pus or a bad smell.  Ask your health care provider about avoiding sun exposure and using sunscreen when the dressings are no longer needed.  Keep all follow-up visits as told by your health care provider. This is important. Contact a health care provider if:  You have more drainage, redness, swelling, or pain at your wound site.  You notice a bad smell coming from the wound site.  Your wound site feels warm to the touch.  Your wound becomes larger  or deeper.  Your wound changes in size or depth. Get help right away if:  Your pain is not controlled with pain medicine.  The tissue inside your wound changes color from pink to white, yellow, or black.  You have a fever.  You have shaking chills.  You are having trouble packing your wound. Summary  Wound packing usually involves placing a moistened packing material into your wound and then covering it with an outer bandage (dressing).  Follow your health care provider's instructions on how often you need to change dressings and pack your wound. You will likely be asked to change dressings 1-2 times a day.  When packing your wound, it is important to use gloves and a clean technique in order to avoid spreading germs into the wound.  Check your wound site every day for signs of infection. This information is not intended to replace advice given to you by your health care provider. Make sure you discuss any questions you have with your health care provider. Document Released: 09/20/2013 Document Revised: 04/01/2017 Document Reviewed: 04/01/2017 Elsevier Patient Education  2020 [employer].

## 2018-09-09 NOTE — Plan of Care (Signed)
Nsg Discharge Note  Admit Date:  09/02/2018 Discharge date: 09/09/2018   Pollyann Kennedy Steadman to be D/C'd Home per MD order.  AVS completed.  Copy for chart, and copy for patient signed, and dated. Patient/caregiver able to verbalize understanding.  Discharge Medication: Allergies as of 09/09/2018       Reactions   Codeine Other (See Comments)   REACTION: headaches   Dilaudid [hydromorphone Hcl] Itching   Morphine And Related Itching, Other (See Comments)   Side effect   Sulfonamide Derivatives Rash        Medication List     TAKE these medications    ALPRAZolam 1 MG tablet Commonly known as: XANAX Take 1-2 tablets (1-2 mg total) by mouth See admin instructions. Take 1 mg by mouth in the morning, take 1 mg by mouth at 1200, take 1 mg by mouth at 1600 and take 2 mg by mouth at bedtime   aspirin 81 MG tablet Take 81 mg by mouth daily.   Biotin 10 MG Caps Take 10 mg by mouth daily.   chlorhexidine 0.12 % solution Commonly known as: PERIDEX Use as directed 15 mLs in the mouth or throat 2 (two) times daily. Before breakfast and after bedtime following brushing and flossing   CINNAMON PO Take 1,000 mg by mouth 2 (two) times daily.   cyclobenzaprine 10 MG tablet Commonly known as: FLEXERIL Take 30 mg by mouth at bedtime.   D3-1000 25 MCG (1000 UT) capsule Generic drug: Cholecalciferol Take 1,000 Units by mouth daily.   FLAX SEED OIL PO Take 1,200 mg by mouth daily.   glimepiride 1 MG tablet Commonly known as: AMARYL Take 1 tablet by mouth daily.   ibuprofen 800 MG tablet Commonly known as: ADVIL Take 1 tablet by mouth 4 (four) times daily as needed.   lithium carbonate 300 MG CR tablet Commonly known as: LITHOBID Take 600 mg by mouth at bedtime.   metFORMIN 1000 MG tablet Commonly known as: GLUCOPHAGE Take 1 tablet by mouth 2 (two) times a day.   methylphenidate 20 MG tablet Commonly known as: RITALIN Take 20 mg by mouth 3 (three) times daily with meals.    naproxen sodium 220 MG tablet Commonly known as: ALEVE Take 440 mg by mouth 2 (two) times daily as needed (for pain or headache).   OCUVITE ADULT 50+ PO Take 1 capsule by mouth daily.   omeprazole 20 MG capsule Commonly known as: PRILOSEC Take 1 capsule (20 mg total) by mouth 2 (two) times daily before a meal.   penicillin G  IVPB Inject 24 Million Units into the vein daily. As a continuous infusion. Indication:  Streptococcal mandibular osteo Last Day of Therapy:  10/15/2018 Labs - Once weekly:  CBC/D and BMP, Labs - Every other week:  ESR and CRP   prazosin 1 MG capsule Commonly known as: MINIPRESS Take 1 mg by mouth at bedtime.   rosuvastatin 20 MG tablet Commonly known as: CRESTOR TAKE (1) TABLET BY MOUTH ONCE DAILY. What changed: See the new instructions.   SUPER B COMPLEX PO Take 1 tablet by mouth daily.   traMADol 50 MG tablet Commonly known as: ULTRAM Take 1 tablet (50 mg total) by mouth every 6 (six) hours as needed for moderate pain.   venlafaxine XR 150 MG 24 hr capsule Commonly known as: EFFEXOR-XR Take 150 mg by mouth daily with breakfast.   vitamin E 400 UNIT capsule Take 400 Units by mouth daily.  Home Infusion Instuctions  (From admission, onward)           Start     Ordered   09/08/18 0000  Home infusion instructions [facility] May follow [place] Dosing Protocol; May administer Cathflo as needed to maintain patency of vascular access device.; Flushing of vascular access device: per [facility] Protocol: 0.9% NaCl pre/post medica...    Question Answer Comment  Instructions May follow [place] Dosing Protocol   Instructions May administer Cathflo as needed to maintain patency of vascular access device.   Instructions Flushing of vascular access device: per [facility] Protocol: 0.9% NaCl pre/post medication administration and prn patency; Heparin 100 u/ml, 17m for implanted ports and Heparin 10u/ml, 558mfor all other central  venous catheters.   Instructions May follow [facility] Anaphylaxis Protocol for First Dose Administration in the home: 0.9% NaCl at 25-50 ml/hr to maintain IV access for protocol meds. Epinephrine 0.3 ml IV/IM PRN and Benadryl 25-50 IV/IM PRN s/s of anaphylaxis.   Instructions [facility] Infusion Coordinator (RN) to assist per patient IV care needs in the home PRN.      09/08/18 1031            Discharge Assessment: Vitals:   09/08/18 2214 09/09/18 0610  BP: (!) 144/84 138/75  Pulse: (!) 57 60  Resp:    Temp: 98.2 F (36.8 C) 98.7 F (37.1 C)  SpO2: 94% 94%   Skin clean, dry and intact without evidence of skin break down, no evidence of skin tears noted. IV catheter discontinued intact. Site without signs and symptoms of complications - no redness or edema noted at insertion site, patient denies c/o pain - only slight tenderness at site.  Dressing with slight pressure applied.  D/c Instructions-Education: Discharge instructions given to patient/family with verbalized understanding. D/c education completed with patient/family including follow up instructions, medication list, d/c activities limitations if indicated, with other d/c instructions as indicated by MD - patient able to verbalize understanding, all questions fully answered. Patient instructed to return to ED, call 911, or call MD for any changes in condition.  [place].  ViSalley SlaughterRN 09/09/2018 1:20 PM

## 2018-09-09 NOTE — TOC Transition Note (Signed)
Transition of Care [facility]) - CM/SW Discharge Note   Patient Details  Name: DEMEISHA GERAGHTY MRN: [id number] Date of Birth: Mar 11, 1955  [facility] CM/SW Contact:  Sharin Mons, RN Phone Number: 09/09/2018, 10:10 AM   Clinical Narrative:    Admitted with Mandibular abscess / osteomyelitis Transition to home today. IV ABX therapy inplace , pt will need 6 wks abx therapy. Liaison Jeannene Patella) with [facility] to see pt @ bedside prior to d/c. Pt has transportation to home.  Final next level of care: [place] Barriers to Discharge: No Barriers Identified   Patient Goals and CMS Choice Patient states their goals for this hospitalization and ongoing recovery are:: to get this infection out of me CMS Medicare.gov Compare Post Acute Care list provided to:: Patient Choice offered to / list presented to : Patient  Discharge Placement                       Discharge Plan and Services   Discharge Planning Services: CM Consult Post Acute Care Choice: Home Health          DME Arranged: IV pump/equipment(IV ABX therapy/ [facility]) DME Agency: Other - Comment([facility]) Date DME Agency Contacted: 09/08/18 Time DME Agency Contacted: 1004 Representative spoke with at DME Agency: [place]: RN [place] Agency: [place] ([place]) Date [place]: 09/08/18 Time [place]: 1005 Representative spoke with at [place]: Butch Penny ( voice message)  Social Determinants of Health ([place]) Interventions     Readmission Risk Interventions No flowsheet data found.

## 2018-09-09 NOTE — Progress Notes (Signed)
   Subjective:    Patient ID: Adrienne [name], female    DOB: 01-24-1956, 63 y.o.   MRN: [id number]  Swelling significantly better.  Pain improved. On IV penicillin G Infectious Disease has managed her antibiotics.  She has a PICC line in place Tolerating diet, secretions, lying flat easily.  No fevers, white blood cell count down trending  Feels a little better but still having pretty bad pain that comes and goes.  Review of Systems  Constitutional: Negative for activity change and appetite change.  HENT: Positive for dental problem. Negative for congestion.   Respiratory: Negative for chest tightness.        Objective:   Physical Exam Vitals signs reviewed.  Constitutional:      General: She is not in acute distress.    Appearance: Normal appearance.  HENT:     Head: Normocephalic.     Nose: Nose normal.     Mouth/Throat:     Mouth: Mucous membranes are moist.     Comments: Floor of mouth is soft Trismus slightly improved No purulence intraorally  Eyes:     Extraocular Movements: Extraocular movements intact.     Conjunctiva/sclera: Conjunctivae normal.     Pupils: Pupils are equal, round, and reactive to light.  Neck:     Comments:  Slightly improved.  No overlying skin erythema.  Penrose drain removed and wound bed was packed.  Patient tolerated well.  Still significant purulent drainage coming out. Cardiovascular:     Rate and Rhythm: Normal rate.  Pulmonary:     Effort: Pulmonary effort is normal.  Abdominal:     General: Abdomen is flat.     Palpations: Abdomen is soft.  Neurological:     Mental Status: She is alert.      WBC improving. Afebrile.      Assessment & Plan:  Right submandibular odontogenic abscess s/p I&D 09/04/2018.  Bedside washout done 09/06/2018.  -Penrose drain has been removed.  She should packed this with quarter inch iodoform twice daily.  RN is to educate her on packing and wound care. -Encouraged patient to continue massage and  expression of purulence inferiorly.  -Defer to ID for abx management She should follow-up with Dr. Redmond Baseman in approximately 5 days   Helayne Seminole, MD  [facility] [place] Office phone [phone]

## 2018-09-09 NOTE — Discharge Summary (Signed)
Physician Discharge Summary  Adrienne [name] YTK:[id number] DOB: 12/03/1955 DOA: 09/02/2018  PCP: Asencion Noble, MD  Admit date: 09/02/2018 Discharge date: 09/09/2018  Admitted From: Home Disposition: Home  Recommendations for Outpatient Follow-up:  1. Follow up with PCP in 1 week with repeat CBC/BMP 2. Outpatient follow-up with ENT.  Wound care as per ENT recommendations 3. outpatient follow-up with ID 4. Follow up in ED if symptoms worsen or new appear   Home Health: Home health RN Equipment/Devices: None  Discharge Condition: Stable CODE STATUS: Full Diet recommendation: Heart healthy/carb modified  Brief/Interim Summary: 63 year old female with COPD, diabetes mellitus type 2, hyperlipidemia, bipolar disorder, anxiety, depression and ADD?  Presented on 09/01/2020 [facility] with right jaw pain and swelling after recent dental extraction on 08/29/2018.  CT showed right jaw and neck abscess and cellulitis.  Patient had failed outpatient treatment with clindamycin.  She was transferred to [facility] for [facility] evaluation.  She had I&D and Penrose drain placement by ENT on 09/03/2018.  Wound cultures grew Streptococcus group F.  ID was consulted.  She was switched to IV penicillin.  Her condition has improved.  Penrose drain was removed by ENT today.  ENT has cleared the patient for discharge.  She will be discharged on IV penicillin till 10/15/2018 with outpatient follow-up with ENT and ID.  Wound care as per ENT recommendations.  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  ENT following.  Status post bedside washout done on 09/06/2018 by ENT.   - Penrose drain was removed by ENT today.  ENT has cleared the patient for discharge.  She will be discharged on IV penicillin for 6 weeks till 10/15/2018 with outpatient follow-up with ENT and ID.  Wound care as per ENT recommendations. -Blood cultures negative so far.  Wound cultures grew few Streptococcus group F.  -PICC line placed on 09/06/2018. -Currently afebrile with improving white cell count.  Diabetes mellitus type 2 uncontrolled with hyperglycemia -A1c 8.8.  On metformin and glimepiride at home.  Resume home meds.  Bipolar disorder/anxiety/depression -Continue home lithium, Xanax, Effexor and Ritalin.  Outpatient follow-up with psychiatry   Discharge Instructions  Discharge Instructions    Call MD for:  redness, tenderness, or signs of infection (pain, swelling, redness, odor or green/yellow discharge around incision site)   Complete by: As directed    Diet - low sodium heart healthy   Complete by: As directed    Diet Carb Modified   Complete by: As directed    Home infusion instructions [facility] May follow [place] Dosing Protocol; May administer Cathflo as needed to maintain patency of vascular access device.; Flushing of vascular access device: per [facility] Protocol: 0.9% NaCl pre/post medica...   Complete by: As directed    Instructions: May follow [place] Dosing Protocol   Instructions: May administer Cathflo as needed to maintain patency of vascular access device.   Instructions: Flushing of vascular access device: per [facility] Protocol: 0.9% NaCl pre/post medication administration and prn patency; Heparin 100 u/ml, 73m for implanted ports  and Heparin 10u/ml, 16m for all other central venous catheters.   Instructions: May follow [facility] Anaphylaxis Protocol for First Dose Administration in the home: 0.9% NaCl at 25-50 ml/hr to maintain IV access for  protocol meds. Epinephrine 0.3 ml IV/IM PRN and Benadryl 25-50 IV/IM PRN s/s of anaphylaxis.   Instructions: [facility] (RN) to assist per patient IV care needs in the home PRN.   Increase activity slowly   Complete by: As directed      Allergies as of 09/09/2018      Reactions   Codeine Other (See Comments)   REACTION: headaches   Dilaudid [hydromorphone Hcl] Itching   Morphine And Related Itching, Other (See Comments)   Side effect   Sulfonamide Derivatives Rash      Medication List    TAKE these medications   ALPRAZolam 1 MG tablet Commonly known as: XANAX Take 1-2 tablets (1-2 mg total) by mouth See admin instructions. Take 1 mg by mouth in the morning, take 1 mg by mouth at 1200, take 1 mg by mouth at 1600 and take 2 mg by mouth at bedtime   aspirin 81 MG tablet Take 81 mg by mouth daily.   Biotin 10 MG Caps Take 10 mg by mouth daily.   chlorhexidine 0.12 % solution Commonly known as: PERIDEX Use as directed 15 mLs in the mouth or throat 2 (two) times daily. Before breakfast and after bedtime following brushing and flossing   CINNAMON PO Take 1,000 mg by mouth 2 (two) times daily.   cyclobenzaprine 10 MG tablet Commonly known as: FLEXERIL Take 30 mg by mouth at bedtime.   D3-1000 25 MCG (1000 UT) capsule Generic drug: Cholecalciferol Take 1,000 Units by mouth daily.   FLAX SEED OIL PO Take 1,200 mg by mouth daily.   glimepiride 1 MG tablet Commonly known as: AMARYL Take 1 tablet by mouth daily.   ibuprofen 800 MG tablet Commonly known as: ADVIL Take 1 tablet by mouth 4 (four) times daily as needed.   lithium carbonate 300 MG CR tablet Commonly known as: LITHOBID Take 600 mg by mouth at bedtime.   metFORMIN 1000 MG tablet Commonly known as: GLUCOPHAGE Take 1 tablet by mouth 2 (two) times a day.   methylphenidate 20 MG tablet Commonly known as: RITALIN Take 20 mg by mouth 3 (three) times daily with meals.   naproxen  sodium 220 MG tablet Commonly known as: ALEVE Take 440 mg by mouth 2 (two) times daily as needed (for pain or headache).   OCUVITE ADULT 50+ PO Take 1 capsule by mouth daily.   omeprazole 20 MG capsule Commonly known as: PRILOSEC Take 1 capsule (20 mg total) by mouth 2 (two) times daily before a meal.   penicillin G  IVPB Inject 24 Million Units into the vein daily. As a continuous infusion. Indication:  Streptococcal mandibular osteo Last Day of Therapy:  10/15/2018 Labs - Once weekly:  CBC/D and BMP, Labs - Every other week:  ESR and CRP   prazosin 1 MG capsule Commonly known as: MINIPRESS Take 1 mg by mouth at bedtime.   rosuvastatin 20 MG tablet Commonly known as: CRESTOR TAKE (1) TABLET BY MOUTH ONCE DAILY. What changed: See the new instructions.   SUPER B COMPLEX PO Take 1 tablet by mouth daily.   traMADol 50 MG tablet Commonly known as: ULTRAM Take 1 tablet (50 mg total) by mouth every 6 (six) hours as needed for moderate pain.   venlafaxine  XR 150 MG 24 hr capsule Commonly known as: EFFEXOR-XR Take 150 mg by mouth daily with breakfast.   vitamin E 400 UNIT capsule Take 400 Units by mouth daily.            Home Infusion Instuctions  (From admission, onward)         Start     Ordered   09/08/18 0000  Home infusion instructions [facility] May follow [place] Dosing Protocol; May administer Cathflo as needed to maintain patency of vascular access device.; Flushing of vascular access device: per [facility] Protocol: 0.9% NaCl pre/post medica...    Question Answer Comment  Instructions May follow [place] Dosing Protocol   Instructions May administer Cathflo as needed to maintain patency of vascular access device.   Instructions Flushing of vascular access device: per [facility] Protocol: 0.9% NaCl pre/post medication administration and prn patency; Heparin 100 u/ml, 56m for implanted ports and Heparin 10u/ml, 581mfor all other central venous catheters.    Instructions May follow [facility] Anaphylaxis Protocol for First Dose Administration in the home: 0.9% NaCl at 25-50 ml/hr to maintain IV access for protocol meds. Epinephrine 0.3 ml IV/IM PRN and Benadryl 25-50 IV/IM PRN s/s of anaphylaxis.   Instructions [facility] Infusion Coordinator (RN) to assist per patient IV care needs in the home PRN.      09/08/18 1031         Follow-up Information    CaGolden CircleFNP Follow up.   Specialties: Family Medicine, Infectious Diseases Why: 8/5 at 2:30 pm. Please call to reschedule if unable to make this time.  Contact information: [address] [place] [phone].   Why: home health services arranged       [facility] Follow up.   Why: home IV antibiotic therapy arranged Contact information: [phone]     BaMelida QuitterMD. Schedule an appointment as soon as possible for a visit in 5 day(s).   Specialty: Otolaryngology Contact information: [phone]        FaAsencion NobleMD. Schedule an appointment as soon as possible for a visit in 1 week(s).   Specialty: Internal Medicine Contact information: [phone]        Allergies  Allergen Reactions  . Codeine Other (See Comments)    REACTION: headaches  . Dilaudid [Hydromorphone Hcl] Itching  . Morphine And Related Itching and Other (See Comments)    Side effect  . Sulfonamide Derivatives Rash    Consultations:  ENT/ID   Procedures/Studies: Ct Soft Tissue Neck W Contrast  Addendum Date: 09/02/2018   ADDENDUM REPORT: 09/02/2018 15:46 ADDENDUM: Study discussed by telephone with Dr. BRNoemi Chapeln 09/02/2018 at 1521 hours. Electronically Signed   By: H Genevie Ann.D.   On: 09/02/2018 15:46   Result Date: 09/02/2018 CLINICAL DATA:  [age]ear old female status post dental extractions  four days ago. Jaw infection not improving. EXAM: CT NECK WITH CONTRAST TECHNIQUE: Multidetector CT imaging of the neck was performed using the standard protocol following the bolus administration of intravenous contrast. CONTRAST:  7595mMNIPAQUE IOHEXOL 300 MG/ML  SOLN COMPARISON:  Report of brain MRI 10/13/2006 (no images available). FINDINGS: Pharynx and larynx: Leftward mass effect on the pharynx and supraglottic larynx related to the abnormality of the right masticator and submandibular  spaces. Negative glottis. Negative superior parapharyngeal spaces. There is a mild retropharyngeal effusion, along with partially retropharyngeal course of both carotids (more so the left). Salivary glands: The central sublingual space remains normal on series 2, image 44. But there is bulky phlegmon throughout the lateral right sublingual space, right submandibular space, and medial right masticator space. The abnormality extends from the hyoid bone cephalad toward the pterygoid plates, and includes serpiginous subperiosteal abscess along both superior and inferior aspect of the posterior body of the mandible (coronal series 4, image 38) and serpiginous fluid tracking inferiorly in the submandibular space toward the midline of the submental region (same image). The abnormality is inseparable from the right submandibular gland which appears highly abnormal, and seems to have abscess fluid tracking within its parenchyma (sagittal image 56). The poorly marginated and somewhat discontinuous appearing abscess fluid encompasses an area of nearly 33 x 28 x 50 millimeters (AP by transverse by CC). Regional cellulitis. Superimposed dental extractions, further detailed below. The left submandibular gland remains within normal limits. The left parotid gland is normal. The right parotid gland is normal except at its inferior margin where it may be mildly secondarily inflamed. Thyroid: Negative aside from mild overlying strap muscle edema.  Lymph nodes: Reactive right level 1 lymph nodes up to 7 millimeter short axis. Reactive right level 2A lymph nodes up to 10 millimeters. No cystic or necrotic nodes. Vascular: The major vascular structures in the neck and at the skull base are patent including the right IJ. There is a retropharyngeal course of the carotids, more so the left. The left vertebral artery appears dominant. Limited intracranial: Negative. Visualized orbits: Negative. Mastoids and visualized paranasal sinuses: Clear. Skeleton: Right mandible molar and wisdom tooth extraction sites with surrounding subperiosteal abscess. There appears to be abscess within the posterior-most extraction site (coronal image 38 and series 2, image 33), and associated abnormal mandible mineralization about the roots of that extraction cavity (series 3, image 40). Left TMJ degeneration. Cervical spine degeneration. No other No acute osseous abnormality identified. Upper chest: Negative visible mediastinum aside from Calcified aortic atherosclerosis. Centrilobular emphysema and lung scarring greater on the left. IMPRESSION: 1. Extensive odontogenic phlegmon and serpiginous abscess occupying the right lower masticator, right submandibular, and lateral right sublingual spaces. Subperiosteal abscess in and around the posterior most right mandible tooth extraction site. 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 Complete involvement of the right submandibular gland, which appears infiltrated by abscess. Regional cellulitis and reactive lymphadenopathy. Associated leftward mass effect on the pharynx and supraglottic larynx. 2. The central sublingual space is spared. There is a small retropharyngeal effusion, but no other deep soft tissue space involvement in neck or upper chest. Electronically Signed: By: Genevie Ann M.D. On: 09/02/2018 15:15   Dg Chest Port 1 View  Result Date: 09/04/2018 CLINICAL DATA:  Shortness of breath EXAM:  PORTABLE CHEST 1 VIEW COMPARISON:  Chest CT 05/24/2014 FINDINGS: The heart size and mediastinal contours are within normal limits. Both lungs are clear. The visualized skeletal structures are unremarkable. IMPRESSION: No active disease. Electronically Signed   By: Ulyses Jarred M.D.   On: 09/04/2018 00:37   [place] Ekg Site Rite  Result Date: 09/06/2018 If Site Rite image not attached, placement could not be confirmed due to current cardiac rhythm.      Subjective: Patient seen and examined at bedside.  She feels better.  States that her drain has been removed this morning by ENT doctor.  No overnight  fever, nausea or vomiting.  Patient feels better enough and thinks that she is ready to go home today. Discharge Exam: Vitals:   09/08/18 2214 09/09/18 0610  BP: (!) 144/84 138/75  Pulse: (!) 57 60  Resp:    Temp: 98.2 F (36.8 C) 98.7 F (37.1 C)  SpO2: 94% 94%    General: Pt is alert, awake, not in acute distress.  Neck dressing present Cardiovascular: rate controlled, S1/S2 + Respiratory: bilateral decreased breath sounds at bases Abdominal: Soft, NT, ND, bowel sounds + Extremities: no edema, no cyanosis    The results of significant diagnostics from this hospitalization (including imaging, microbiology, ancillary and laboratory) are listed below for reference.     Microbiology: Recent Results (from the past 240 hour(s))  Blood Culture (routine x 2)     Status: None   Collection Time: 09/02/18 12:15 PM   Specimen: BLOOD LEFT ARM  Result Value Ref Range Status   Specimen Description BLOOD LEFT ARM  Final   Special Requests   Final    BOTTLES DRAWN AEROBIC AND ANAEROBIC Blood Culture adequate volume   Culture   Final    NO GROWTH 5 DAYS Performed at [facility], [address] [place], [place] [address]    Report Status 09/07/2018 FINAL  Final  SARS Coronavirus 2 (CEPHEID - Performed in [place] hospital lab), Hosp Order     Status: None   Collection Time: 09/02/18  12:16 PM   Specimen: Nasopharyngeal Swab  Result Value Ref Range Status   SARS Coronavirus 2 NEGATIVE NEGATIVE Final    Comment: (NOTE) If result is NEGATIVE SARS-CoV-2 target nucleic acids are NOT DETECTED. The SARS-CoV-2 RNA is generally detectable in upper and lower  respiratory specimens during the acute phase of infection. The lowest  concentration of SARS-CoV-2 viral copies this assay can detect is 250  copies / mL. A negative result does not preclude SARS-CoV-2 infection  and should not be used as the sole basis for treatment or other  patient management decisions.  A negative result may occur with  improper specimen collection / handling, submission of specimen other  than nasopharyngeal swab, presence of viral mutation(s) within the  areas targeted by this assay, and inadequate number of viral copies  (<250 copies / mL). A negative result must be combined with clinical  observations, patient history, and epidemiological information. If result is POSITIVE SARS-CoV-2 target nucleic acids are DETECTED. The SARS-CoV-2 RNA is generally detectable in upper and lower  respiratory specimens dur ing the acute phase of infection.  Positive  results are indicative of active infection with SARS-CoV-2.  Clinical  correlation with patient history and other diagnostic information is  necessary to determine patient infection status.  Positive results do  not rule out bacterial infection or co-infection with other viruses. If result is PRESUMPTIVE POSTIVE SARS-CoV-2 nucleic acids MAY BE PRESENT.   A presumptive positive result was obtained on the submitted specimen  and confirmed on repeat testing.  While 2019 novel coronavirus  (SARS-CoV-2) nucleic acids may be present in the submitted sample  additional confirmatory testing may be necessary for epidemiological  and / or clinical management purposes  to differentiate between  SARS-CoV-2 and other Sarbecovirus currently known to infect  humans.  If clinically indicated additional testing with an alternate test  methodology [phone]) is advised. The SARS-CoV-2 RNA is generally  detectable in upper and lower respiratory sp ecimens during the acute  phase of infection. The expected result is Negative. Fact  Sheet for Patients:  StrictlyIdeas.no Fact Sheet for Healthcare Providers: BankingDealers.co.za This test is not yet approved or cleared by the [place] FDA and has been authorized for detection and/or diagnosis of SARS-CoV-2 by FDA under an Emergency Use Authorization (EUA).  This EUA will remain in effect (meaning this test can be used) for the duration of the COVID-19 declaration under Section 564(b)(1) of the Act, 21 U.S.C. section 360bbb-3(b)(1), unless the authorization is terminated or revoked sooner. Performed at [facility], [address] [place], [place] [address]   Blood Culture (routine x 2)     Status: None   Collection Time: 09/02/18 12:23 PM   Specimen: BLOOD RIGHT ARM  Result Value Ref Range Status   Specimen Description BLOOD RIGHT ARM  Final   Special Requests   Final    BOTTLES DRAWN AEROBIC AND ANAEROBIC Blood Culture results may not be optimal due to an excessive volume of blood received in culture bottles   Culture   Final    NO GROWTH 5 DAYS Performed at [facility], [address] [place], [place] [address]    Report Status 09/07/2018 FINAL  Final  Aerobic/Anaerobic Culture (surgical/deep wound)     Status: None   Collection Time: 09/03/18  4:37 PM   Specimen: Abscess  Result Value Ref Range Status   Specimen Description ABSCESS RIGHT NECK  Final   Special Requests PATIENT ON FOLLOWING CLEOCIN AZACTAM LEVAQUIN  Final   Gram Stain   Final    RARE WBC PRESENT, PREDOMINANTLY PMN FEW GRAM POSITIVE COCCI IN PAIRS    Culture   Final    FEW STREPTOCOCCUS GROUP F NO ANAEROBES ISOLATED Performed at [facility], [place] [address] [place], [place] [address]    Report Status 09/08/2018 FINAL  Final     Labs: BNP (last 3 results) No results for input(s): BNP in the last 8760 hours. Basic Metabolic Panel: Recent Labs  Lab 09/02/18 1356 09/05/18 0343 09/07/18 0439 09/08/18 0305 09/09/18 0306  NA 132* 137 137 138 136  K 4.2 3.9 3.8 4.3 4.3  CL 92* 99 100 102 102  CO2 _0 GLUCOSE 300* 99 143* 153* 150*  BUN 28* _1 CREATININE 0.96 0.85 0.73 0.71 0.69  CALCIUM 9.3 8.7* 9.0 8.9 9.0  MG  --   --   --  1.9 1.9   Liver Function Tests: No results for input(s): AST, ALT, ALKPHOS, BILITOT, PROT, ALBUMIN in the last 168 hours. No results for input(s): LIPASE, AMYLASE in the last 168 hours. No results for input(s): AMMONIA in the last 168 hours. CBC: Recent Labs  Lab 09/02/18 1356  09/05/18 0343 09/06/18 0403 09/07/18 0439 09/08/18 0305 09/09/18 0426  WBC 24.2*   < > 14.5* 12.1* 12.8* 12.7* 11.7*  NEUTROABS 20.1*  --   --   --   --   --  6.6  HGB 13.7   < > 12.6 12.8 12.8 12.9 13.1  HCT 42.3   < > 38.3 40.5 40.6 40.2 41.7  MCV 92.0   < > 94.1 93.8 93.8 93.5 95.0  PLT 364   < > 417* 430* 447* 461* 469*   < > = values in this interval not displayed.   Cardiac Enzymes: No results for input(s): CKTOTAL, CKMB, CKMBINDEX, TROPONINI in the last 168 hours. BNP: Invalid input(s): POCBNP CBG: Recent Labs  Lab 09/08/18 0902 09/08/18 1212 09/08/18 1742 09/08/18 2217 09/09/18 4469  GLUCAP 234* 230* 188* 152* 135*   D-Dimer No results for input(s): DDIMER in the last 72 hours. Hgb A1c No results for input(s): HGBA1C in the last 72 hours. Lipid Profile No results for input(s): CHOL, HDL, LDLCALC, TRIG, CHOLHDL, LDLDIRECT in the last 72 hours. Thyroid function studies No results for input(s): TSH, T4TOTAL, T3FREE, THYROIDAB in the last 72 hours.  Invalid input(s): FREET3 Anemia work up No results for input(s): VITAMINB12, FOLATE, FERRITIN, TIBC, IRON, RETICCTPCT in the last 72  hours. Urinalysis    Component Value Date/Time   COLORURINE YELLOW 09/02/2018 1151   APPEARANCEUR HAZY (A) 09/02/2018 1151   LABSPEC >1.046 (H) 09/02/2018 1151   PHURINE 5.0 09/02/2018 1151   GLUCOSEU 150 (A) 09/02/2018 1151   HGBUR SMALL (A) 09/02/2018 1151   BILIRUBINUR NEGATIVE 09/02/2018 1151   KETONESUR NEGATIVE 09/02/2018 1151   PROTEINUR 30 (A) 09/02/2018 1151   UROBILINOGEN 0.2 12/16/2007 1404   NITRITE NEGATIVE 09/02/2018 1151   LEUKOCYTESUR NEGATIVE 09/02/2018 1151   Sepsis Labs Invalid input(s): PROCALCITONIN,  WBC,  LACTICIDVEN Microbiology Recent Results (from the past 240 hour(s))  Blood Culture (routine x 2)     Status: None   Collection Time: 09/02/18 12:15 PM   Specimen: BLOOD LEFT ARM  Result Value Ref Range Status   Specimen Description BLOOD LEFT ARM  Final   Special Requests   Final    BOTTLES DRAWN AEROBIC AND ANAEROBIC Blood Culture adequate volume   Culture   Final    NO GROWTH 5 DAYS Performed at [facility], [address], [place], [place] [address]    Report Status 09/07/2018 FINAL  Final  SARS Coronavirus 2 (CEPHEID - Performed in [place] hospital lab), Hosp Order     Status: None   Collection Time: 09/02/18 12:16 PM   Specimen: Nasopharyngeal Swab  Result Value Ref Range Status   SARS Coronavirus 2 NEGATIVE NEGATIVE Final    Comment: (NOTE) If result is NEGATIVE SARS-CoV-2 target nucleic acids are NOT DETECTED. The SARS-CoV-2 RNA is generally detectable in upper and lower  respiratory specimens during the acute phase of infection. The lowest  concentration of SARS-CoV-2 viral copies this assay can detect is 250  copies / mL. A negative result does not preclude SARS-CoV-2 infection  and should not be used as the sole basis for treatment or other  patient management decisions.  A negative result may occur with  improper specimen collection / handling, submission of specimen other  than nasopharyngeal swab, presence of viral mutation(s)  within the  areas targeted by this assay, and inadequate number of viral copies  (<250 copies / mL). A negative result must be combined with clinical  observations, patient history, and epidemiological information. If result is POSITIVE SARS-CoV-2 target nucleic acids are DETECTED. The SARS-CoV-2 RNA is generally detectable in upper and lower  respiratory specimens dur ing the acute phase of infection.  Positive  results are indicative of active infection with SARS-CoV-2.  Clinical  correlation with patient history and other diagnostic information is  necessary to determine patient infection status.  Positive results do  not rule out bacterial infection or co-infection with other viruses. If result is PRESUMPTIVE POSTIVE SARS-CoV-2 nucleic acids MAY BE PRESENT.   A presumptive positive result was obtained on the submitted specimen  and confirmed on repeat testing.  While 2019 novel coronavirus  (SARS-CoV-2) nucleic acids may be present in the submitted sample  additional confirmatory testing may be necessary for epidemiological  and / or  clinical management purposes  to differentiate between  SARS-CoV-2 and other Sarbecovirus currently known to infect humans.  If clinically indicated additional testing with an alternate test  methodology [phone]) is advised. The SARS-CoV-2 RNA is generally  detectable in upper and lower respiratory sp ecimens during the acute  phase of infection. The expected result is Negative. Fact Sheet for Patients:  StrictlyIdeas.no Fact Sheet for Healthcare Providers: BankingDealers.co.za This test is not yet approved or cleared by the [place] FDA and has been authorized for detection and/or diagnosis of SARS-CoV-2 by FDA under an Emergency Use Authorization (EUA).  This EUA will remain in effect (meaning this test can be used) for the duration of the COVID-19 declaration under Section 564(b)(1) of the Act,  21 U.S.C. section 360bbb-3(b)(1), unless the authorization is terminated or revoked sooner. Performed at [facility], [address] [place], [place] [address]   Blood Culture (routine x 2)     Status: None   Collection Time: 09/02/18 12:23 PM   Specimen: BLOOD RIGHT ARM  Result Value Ref Range Status   Specimen Description BLOOD RIGHT ARM  Final   Special Requests   Final    BOTTLES DRAWN AEROBIC AND ANAEROBIC Blood Culture results may not be optimal due to an excessive volume of blood received in culture bottles   Culture   Final    NO GROWTH 5 DAYS Performed at [facility], [address], [place], [place] [address]    Report Status 09/07/2018 FINAL  Final  Aerobic/Anaerobic Culture (surgical/deep wound)     Status: None   Collection Time: 09/03/18  4:37 PM   Specimen: Abscess  Result Value Ref Range Status   Specimen Description ABSCESS RIGHT NECK  Final   Special Requests PATIENT ON FOLLOWING CLEOCIN AZACTAM LEVAQUIN  Final   Gram Stain   Final    RARE WBC PRESENT, PREDOMINANTLY PMN FEW GRAM POSITIVE COCCI IN PAIRS    Culture   Final    FEW STREPTOCOCCUS GROUP F NO ANAEROBES ISOLATED Performed at [facility], [place] [address] [place],  [place] [address]    Report Status 09/08/2018 FINAL  Final     Time coordinating discharge: 35 minutes  SIGNED:   Aline August, MD  [facility] 09/09/2018, 9:29 AM

## 2018-09-13 ENCOUNTER — Other Ambulatory Visit: Payer: Self-pay | Admitting: Nurse Practitioner

## 2018-09-16 DIAGNOSIS — K122 Cellulitis and abscess of mouth: Secondary | ICD-10-CM | POA: Insufficient documentation

## 2018-10-12 ENCOUNTER — Other Ambulatory Visit: Payer: Self-pay

## 2018-10-12 ENCOUNTER — Telehealth: Payer: Self-pay

## 2018-10-12 ENCOUNTER — Ambulatory Visit (INDEPENDENT_AMBULATORY_CARE_PROVIDER_SITE_OTHER): Payer: Medicare Other | Admitting: Family

## 2018-10-12 ENCOUNTER — Encounter: Payer: Self-pay | Admitting: Family

## 2018-10-12 VITALS — BP 150/89 | HR 84 | Temp 98.9°F

## 2018-10-12 DIAGNOSIS — M272 Inflammatory conditions of jaws: Secondary | ICD-10-CM | POA: Diagnosis not present

## 2018-10-12 DIAGNOSIS — M869 Osteomyelitis, unspecified: Secondary | ICD-10-CM

## 2018-10-12 NOTE — Telephone Encounter (Signed)
Per Terri Piedra, Np called [facility] with verbal order to pull picc on 8/8. Spoke with Stanton Kidney who was able to take verbal order. Stanton Kidney will inform nursing to pull picc after last dose. [place]

## 2018-10-12 NOTE — Assessment & Plan Note (Signed)
Ms. Haider is near completion of 6 weeks of IV therapy with penicillin for Streptococcus Group F mandibular abscess s/p incision and drainage. She has healed well and her inflammatory markers are significantly improved. Discussed concern for origin of infection with her mouth being the most likely source. Recommend further dental care to help prevent infection in the future. Orders to remove PICC on 10/15/18 provided to [place]. No further treatment is necessary at this time. We have discussed to monitor for symptoms of infection including increasing pain, redness, or swelling. Follow up with ID office can be as needed.

## 2018-10-12 NOTE — Progress Notes (Signed)
Subjective:    Patient ID: Adrienne [name], female    DOB: 01/07/1956, 63 y.o.   MRN: [id number]  Chief Complaint  Patient presents with   Mandibular Abscess     HPI:  Adrienne [name] is a 63 y.o. female with multiple medical problems including diabetes, hypertension, COPD, closed head injury in 2005, and obstructive sleep apnea who was admitted to the hospital in late June 2019 with progressive swelling of her lower jaw following the removal of 2 lower teeth on the right side about 1 week prior having fevers and trismus on admission.  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   ENT performed incision and drainage of the abscess with pus encountered upon cavitary entry.  Cultures were positive for group F Streptococcus infection.  [name] was started on penicillin with goal of treatment of 6 weeks with end date scheduled for 10/15/2018.  All hospital records, labs, and imaging reviewed in detail.  Since leaving the hospital [name] has been seen by Dr. Redmond Baseman and released with good wound healing of her wound/incision site.  Blood work reviewed from home health shows improved inflammatory markers with most recent blood work from 10/10/2018 with sedimentation rate of 15 and a CRP of 2.  Ms. Branscum continues to receive her penicillin through continuous IV pump through a PICC line which is functioning appropriately with no complications.  No adverse side effects.  Family have noted that she has been more confused and having hallucinations over the last 6 days and been in contact with her primary care provider who has stopped her lithium and cyclobenzaprine.  She continues to describe seeing "[place] stars" at times.  Family is concerned as she did fall and strike her head about 3 months ago and was never  seen.  Considering evaluation by neurology.  She has had no systemic symptoms since leaving the hospital including fevers, chills, or sweats.   Allergies  Allergen Reactions   Codeine Other (See Comments)    REACTION: headaches   Dilaudid [Hydromorphone Hcl] Itching   Morphine And Related Itching and Other (See Comments)    Side effect   Sulfonamide Derivatives Rash      Outpatient Medications Prior to Visit  Medication Sig Dispense Refill   ALPRAZolam (XANAX) 1 MG tablet Take 1-2 tablets (1-2 mg total) by mouth See admin instructions. Take 1 mg by mouth in the morning, take 1 mg by mouth at 1200, take 1 mg by mouth at 1600 and take 2 mg by mouth at bedtime     aspirin 81 MG tablet Take 81 mg by mouth daily.     B Complex-C (SUPER B COMPLEX PO) Take 1 tablet by mouth daily.     Biotin 10 MG CAPS Take 10 mg by mouth daily.      chlorhexidine (PERIDEX) 0.12 % solution Use as directed 15 mLs in the mouth or throat 2 (two) times daily. Before breakfast and after bedtime following brushing and flossing     Cholecalciferol (D3-1000) 25 MCG (1000 UT) capsule Take 1,000 Units by mouth daily.      CINNAMON PO Take 1,000 mg by mouth 2 (two) times daily.      Flaxseed, Linseed, (FLAX SEED OIL PO) Take 1,200 mg by mouth daily.     glimepiride (AMARYL) 1 MG tablet Take 1  tablet by mouth daily.     ibuprofen (ADVIL) 800 MG tablet Take 1 tablet by mouth 4 (four) times daily as needed.     metFORMIN (GLUCOPHAGE) 1000 MG tablet Take 1 tablet by mouth 2 (two) times a day.     methylphenidate (RITALIN) 20 MG tablet Take 20 mg by mouth 3 (three) times daily with meals.      naproxen sodium (ALEVE) 220 MG tablet Take 440 mg by mouth 2 (two) times daily as needed (for pain or headache).      omeprazole (PRILOSEC) 20 MG capsule TAKE (1) CAPSULE BY MOUTH TWICE DAILY. 60 capsule 5   prazosin (MINIPRESS) 1 MG capsule Take 1 mg by mouth at bedtime.     rosuvastatin (CRESTOR) 20 MG tablet  TAKE (1) TABLET BY MOUTH ONCE DAILY. (Patient taking differently: Take 20 mg by mouth daily. ) 90 tablet 3   traMADol (ULTRAM) 50 MG tablet Take 1 tablet (50 mg total) by mouth every 6 (six) hours as needed for moderate pain. 14 tablet 0   venlafaxine XR (EFFEXOR-XR) 150 MG 24 hr capsule Take 150 mg by mouth daily with breakfast.     vitamin E 400 UNIT capsule Take 400 Units by mouth daily.     penicillin G IVPB Inject 24 Million Units into the vein daily. As a continuous infusion. Indication:  Streptococcal mandibular osteo Last Day of Therapy:  10/15/2018 Labs - Once weekly:  CBC/D and BMP, Labs - Every other week:  ESR and CRP 38 Units 0   cyclobenzaprine (FLEXERIL) 10 MG tablet Take 30 mg by mouth at bedtime.      lithium carbonate (LITHOBID) 300 MG CR tablet Take 600 mg by mouth at bedtime.      Multiple Vitamins-Minerals (OCUVITE ADULT 50+ PO) Take 1 capsule by mouth daily.     No facility-administered medications prior to visit.      Past Medical History:  Diagnosis Date   Allergic rhinitis    Allergy    Anemia    leukocytosis/thrombocytosis--Dr. Tressie Stalker   Anxiety    Arthritis    Asthma    Brain injury ([place])    Closed head   Closed head injury 4696   Complication of anesthesia    "my blood pressure dropped"   COPD (chronic obstructive pulmonary disease) ([place])    Depression    ? Bipolar Disorder--Dr, Jimmye Norman   Emphysema of lung ([place])    GERD (gastroesophageal reflux disease)    Barrett's esophagus   Headache    migraines   Hx of migraines    Hypertension    Low back pain    Macular degeneration, bilateral    Dry-Dr. Iona Hansen   Neuromuscular disorder (HCC)    NIDDM (non-insulin dependent diabetes mellitus)    OSA (obstructive sleep apnea)    cannot tolerate mask   RLS (restless legs syndrome)    Seizure disorder ([place])    ? absence; due to head truama   Seizures ([place])    Sleep apnea    Status post right hip replacement     Status post rotator cuff surgery      Past Surgical History:  Procedure Laterality Date   ABDOMINAL HYSTERECTOMY     heavy bleeding   APPENDECTOMY     BIOPSY  12/27/2014   Procedure: Esophageal BIOPSY;  Surgeon: Daneil Dolin, MD;  Location: [facility];  Service: Endoscopy;;   BIOPSY  02/17/2018   Procedure: BIOPSY;  Surgeon: Daneil Dolin, MD;  Location: [facility];  Service: Endoscopy;;  (gastric)   COLONOSCOPY WITH ESOPHAGOGASTRODUODENOSCOPY (EGD)  12/08   TCS/EGD: + Barrett's,+ gastritis, friable anal canal, hyperplastic polyp   COLONOSCOPY WITH PROPOFOL N/A 12/27/2014   RMR: Internal hemorrhoids likely source of hematochezia. Redundant colon.    ESOPHAGOGASTRODUODENOSCOPY  11/16/2002   Dr.Rourk- salmon-colored epithelium in the upper esophageal sphincter previous bx= ectopic gastric mucosa, salmon colored epithelium at the distal esophagus proviously bx= barretts esophagus o/w the esophageal mucosa appeared normal. patulous esophagogastric junction, small hiatal hernia, gastric mucosa o/w appeared normal. normal D1 and D2   ESOPHAGOGASTRODUODENOSCOPY (EGD) WITH PROPOFOL N/A 12/27/2014   RMR: Abnoraml distal esophagus consistiant with prior diagnosis fo Barretts esophagus- Status post biopsy. Hiatal hernia. Antral erosions status post gastric biopsy,   ESOPHAGOGASTRODUODENOSCOPY (EGD) WITH PROPOFOL N/A 02/17/2018   Procedure: ESOPHAGOGASTRODUODENOSCOPY (EGD) WITH PROPOFOL;  Surgeon: Daneil Dolin, MD;  [facility]: [facility];  Service: Endoscopy;  Laterality: N/A;  1:30pm   FRACTURE SURGERY Left 03/07/2013   knee   fx left knee Left 2014   INCISION AND DRAINAGE ABSCESS Right 09/03/2018   Procedure: INCISION AND DRAINAGE RIGHT NECK ABSCESS;  Surgeon: Melida Quitter, MD;  Location: [facility] OR;  Service: ENT;  Laterality: Right;   ORIF TIBIA PLATEAU Left 03/07/2013   Procedure: OPEN REDUCTION INTERNAL FIXATION (ORIF) TIBIAL PLATEAU;  Surgeon: Renette Butters, MD;  Location: [facility];  Service: Orthopedics;  Laterality: Left;   REVISION TOTAL HIP ARTHROPLASTY     Right   ROTATOR CUFF REPAIR     Bilaterally   SHOULDER INJECTION Left 03/07/2013   Procedure: SHOULDER INJECTION ; Depomedrol + 0.25% Marcaine mixture 4:1;  Surgeon: Renette Butters, MD;  Location: [place];  Service: Orthopedics;  Laterality: Left;   TONSILLECTOMY     TOTAL HIP ARTHROPLASTY Left 06/01/2016   Procedure: LEFT TOTAL HIP ARTHROPLASTY ANTERIOR APPROACH;  Surgeon: Gaynelle Arabian, MD;  Location: [facility];  Service: Orthopedics;  Laterality: Left;   VESICOVAGINAL FISTULA CLOSURE W/ TAH       Review of Systems  Constitutional: Negative for chills, fatigue and fever.  Respiratory: Negative for cough, chest tightness, shortness of breath and wheezing.   Cardiovascular: Negative for chest pain and palpitations.  Gastrointestinal: Negative for abdominal pain, constipation, diarrhea, nausea and vomiting.  Neurological: Negative for headaches.  Psychiatric/Behavioral: Positive for hallucinations.      Objective:    BP (!) 150/89    Pulse 84    Temp 98.9 F (37.2 C) (Oral)  Nursing note and vital signs reviewed.  Physical Exam Constitutional:      General: She is not in acute distress.    Appearance: She is well-developed.  HENT:     Mouth/Throat:     Dentition: Abnormal dentition. No dental abscesses or gum lesions.     Tongue: No lesions.  Neck:     Musculoskeletal: Neck supple. No neck rigidity or muscular tenderness.     Comments: Surgical incision on right side of neck appears well healed with no evidence of infection.  Cardiovascular:     Rate and Rhythm: Normal rate and regular rhythm.     Heart sounds: Normal heart sounds.  Pulmonary:     Effort: Pulmonary effort is normal.     Breath sounds: Normal breath sounds. No wheezing.  Lymphadenopathy:     Cervical: No cervical adenopathy.  Skin:    General: Skin is warm and dry.  Neurological:     Mental Status: She is alert and  oriented to person, place, and time.  Psychiatric:        Mood and Affect: Mood normal.       Assessment & Plan:   Problem List Items Addressed This Visit      Musculoskeletal and Integument   Mandibular abscess - Primary    Ms. Rigor is near completion of 6 weeks of IV therapy with penicillin for Streptococcus Group F mandibular abscess s/p incision and drainage. She has healed well and her inflammatory markers are significantly improved. Discussed concern for origin of infection with her mouth being the most likely source. Recommend further dental care to help prevent infection in the future. Orders to remove PICC on 10/15/18 provided to [place]. No further treatment is necessary at this time. We have discussed to monitor for symptoms of infection including increasing pain, redness, or swelling. Follow up with ID office can be as needed.       Pyogenic inflammation of bone (HCC)      I have discontinued Michale S. Lashway's penicillin G. I am also having her maintain her cyclobenzaprine, prazosin, venlafaxine XR, methylphenidate, rosuvastatin, naproxen sodium, aspirin, CINNAMON PO, B Complex-C (SUPER B COMPLEX PO), Biotin, Cholecalciferol, vitamin E, lithium carbonate, Multiple Vitamins-Minerals (OCUVITE ADULT 50+ PO), (Flaxseed, Linseed, (FLAX SEED OIL PO)), metFORMIN, glimepiride, ibuprofen, chlorhexidine, ALPRAZolam, traMADol, and omeprazole.   Follow-up: Return if symptoms worsen or fail to improve.   Terri Piedra, MSN, FNP-C Nurse Practitioner [facility] [place] number: [phone]

## 2018-10-12 NOTE — Patient Instructions (Signed)
Nice to see you!  We will complete your treatment on 10/15/18 and ensure that [place] has orders to remove your PICC line.   No further treatment is necessary at this time.  Recommend follow up with your dentist for routine dental care.   Follow up with ID office as needed.  Have a great day and stay safe!

## 2018-11-21 ENCOUNTER — Encounter (INDEPENDENT_AMBULATORY_CARE_PROVIDER_SITE_OTHER): Payer: Medicare Other | Admitting: Ophthalmology

## 2019-01-11 ENCOUNTER — Encounter (INDEPENDENT_AMBULATORY_CARE_PROVIDER_SITE_OTHER): Payer: Medicare Other | Admitting: Ophthalmology

## 2019-01-11 ENCOUNTER — Other Ambulatory Visit: Payer: Self-pay

## 2019-01-11 DIAGNOSIS — I10 Essential (primary) hypertension: Secondary | ICD-10-CM | POA: Diagnosis not present

## 2019-01-11 DIAGNOSIS — H43813 Vitreous degeneration, bilateral: Secondary | ICD-10-CM | POA: Diagnosis not present

## 2019-01-11 DIAGNOSIS — H353231 Exudative age-related macular degeneration, bilateral, with active choroidal neovascularization: Secondary | ICD-10-CM | POA: Diagnosis not present

## 2019-01-11 DIAGNOSIS — H35033 Hypertensive retinopathy, bilateral: Secondary | ICD-10-CM | POA: Diagnosis not present

## 2019-01-11 DIAGNOSIS — H2513 Age-related nuclear cataract, bilateral: Secondary | ICD-10-CM

## 2019-01-17 ENCOUNTER — Encounter: Payer: Self-pay | Admitting: Neurology

## 2019-01-17 ENCOUNTER — Ambulatory Visit: Payer: Medicare Other | Admitting: Neurology

## 2019-01-17 ENCOUNTER — Other Ambulatory Visit: Payer: Self-pay

## 2019-01-17 VITALS — BP 153/101 | HR 73 | Temp 97.4°F | Ht 63.0 in | Wt 185.6 lb

## 2019-01-17 DIAGNOSIS — R441 Visual hallucinations: Secondary | ICD-10-CM | POA: Diagnosis not present

## 2019-01-17 DIAGNOSIS — E538 Deficiency of other specified B group vitamins: Secondary | ICD-10-CM | POA: Diagnosis not present

## 2019-01-17 DIAGNOSIS — R413 Other amnesia: Secondary | ICD-10-CM

## 2019-01-17 HISTORY — DX: Visual hallucinations: R44.1

## 2019-01-17 NOTE — Progress Notes (Signed)
Reason for visit: Visual hallucinations  Referring physician: Dr. Nicholos Johns is a 63 y.o. female  History of present illness:  Ms. Rendall is a 63 year old right-handed white female with a history of bipolar disorder, COPD, diabetes, anxiety and depression, and ADD.  The patient was admitted to the hospital on 02 September 2018 for a submandibular abscess following a dental extraction.  The patient required IV antibiotics.  She apparently fell about 4 months ago and hit the right brow.  Around this time, she began having problems with visual hallucinations.  Initially the hallucinations were associated with lights that simulated the northern lights, they were not well organized images.  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  She is not able to read a book, she can see some images on the TV but cannot see which channel the TV is on.  She does not operate a motor vehicle.  The patient has been followed through psychiatry, her psychiatrist took her off of lithium when she started having hallucinations.  The patient has continued to have these events, she will have a visual hallucination once every week or 2, the episodes are usually associated with seeing people inside of her house, she may see clear images with colors.  Occasionally she may see an animal such as a bear, but the hallucinations are not threatening to her.  On one occasion, she had an olfactory component to the hallucination, she saw someone in her kitchen cooking bacon and she could smell the bacon.  She has not had any auditory component to the hallucinations.  The patient does have occasional headaches, she has a history of migraine.  She has had some headaches over the last month.  The patient has had some tremors of the hands, she has not had any recent blackouts.  She does have some frequency and urgency and incontinence of the bowels and the bladder that has been going on for  about 8 months.  She has some underlying fatigue issues.  She is sent to the office for further evaluation of the above problems.  She does report some short-term memory issues that has been getting worse, her family concurs that this is also a problem.  The patient currently lives alone, she manages her own medications.  Past Medical History:  Diagnosis Date   Allergic rhinitis    Allergy    Anemia    leukocytosis/thrombocytosis--Dr. Tressie Stalker   Anxiety    Arthritis    Asthma    Brain injury ([place])    Closed head   Closed head injury [id number]   Complication of anesthesia    "my blood pressure dropped"   COPD (chronic obstructive pulmonary disease) ([place])    Depression    ? Bipolar Disorder--Dr, Jimmye Norman   Emphysema of lung ([place])    GERD (gastroesophageal reflux disease)    Barrett's esophagus   Headache    migraines   Hx of migraines    Hypertension    Low back pain    Macular degeneration, bilateral    Dry-Dr. Iona Hansen   Neuromuscular disorder (H[facility])    NIDDM (non-insulin dependent diabetes mellitus)    OSA (obstructive sleep apnea)    cannot tolerate mask   RLS (restless legs syndrome)    Seizure disorder ([place])    ? absence; due to head truama   Seizures (H[facility])    Sleep apnea    Status post right hip replacement  Status post rotator cuff surgery     Past Surgical History:  Procedure Laterality Date   ABDOMINAL HYSTERECTOMY     heavy bleeding   APPENDECTOMY     BIOPSY  12/27/2014   Procedure: Esophageal BIOPSY;  Surgeon: Daneil Dolin, MD;  Location: [facility];  Service: Endoscopy;;   BIOPSY  02/17/2018   Procedure: BIOPSY;  Surgeon: Daneil Dolin, MD;  Location: [facility];  Service: Endoscopy;;  (gastric)   COLONOSCOPY WITH ESOPHAGOGASTRODUODENOSCOPY (EGD)  12/08   TCS/EGD: + Barrett's,+ gastritis, friable anal canal, hyperplastic polyp   COLONOSCOPY WITH PROPOFOL N/A 12/27/2014   RMR: Internal hemorrhoids likely source of  hematochezia. Redundant colon.    ESOPHAGOGASTRODUODENOSCOPY  11/16/2002   Dr.Rourk- salmon-colored epithelium in the upper esophageal sphincter previous bx= ectopic gastric mucosa, salmon colored epithelium at the distal esophagus proviously bx= barretts esophagus o/w the esophageal mucosa appeared normal. patulous esophagogastric junction, small hiatal hernia, gastric mucosa o/w appeared normal. normal D1 and D2   ESOPHAGOGASTRODUODENOSCOPY (EGD) WITH PROPOFOL N/A 12/27/2014   RMR: Abnoraml distal esophagus consistiant with prior diagnosis fo Barretts esophagus- Status post biopsy. Hiatal hernia. Antral erosions status post gastric biopsy,   ESOPHAGOGASTRODUODENOSCOPY (EGD) WITH PROPOFOL N/A 02/17/2018   Procedure: ESOPHAGOGASTRODUODENOSCOPY (EGD) WITH PROPOFOL;  Surgeon: Daneil Dolin, MD;  Location: [facility];  Service: Endoscopy;  Laterality: N/A;  1:30pm   FRACTURE SURGERY Left 03/07/2013   knee   fx left knee Left 2014   INCISION AND DRAINAGE ABSCESS Right 09/03/2018   Procedure: INCISION AND DRAINAGE RIGHT NECK ABSCESS;  Surgeon: Melida Quitter, MD;  [facility];  Service: ENT;  Laterality: Right;   ORIF TIBIA PLATEAU Left 03/07/2013   Procedure: OPEN REDUCTION INTERNAL FIXATION (ORIF) TIBIAL PLATEAU;  Surgeon: Renette Butters, MD;  Location: [place];  Service: Orthopedics;  Laterality: Left;   REVISION TOTAL HIP ARTHROPLASTY     Right   ROTATOR CUFF REPAIR     Bilaterally   SHOULDER INJECTION Left 03/07/2013   Procedure: SHOULDER INJECTION ; Depomedrol + 0.25% Marcaine mixture 4:1;  Surgeon: Renette Butters, MD;  Location: [place];  Service: Orthopedics;  Laterality: Left;   TONSILLECTOMY     TOTAL HIP ARTHROPLASTY Left 06/01/2016   Procedure: LEFT TOTAL HIP ARTHROPLASTY ANTERIOR APPROACH;  Surgeon: Gaynelle Arabian, MD;  Location: [facility];  Service: Orthopedics;  Laterality: Left;   VESICOVAGINAL FISTULA CLOSURE W/ TAH      Family History  Problem Relation Age of  Onset   Heart disease Father 45   Atrial fibrillation Mother    Cancer Mother        carcinoid   Diabetes Maternal Grandfather    Cancer Maternal Grandfather    Heart disease Maternal Grandmother    Thyroid disease Son     Social history:  reports that she has been smoking cigarettes. She started smoking about 47 years ago. She has a 20.00 pack-year smoking history. She has never used smokeless tobacco. She reports that she does not drink alcohol or use drugs.  Medications:  Prior to Admission medications   Medication Sig Start Date End Date Taking? Authorizing Provider  ALPRAZolam Duanne Moron) 1 MG tablet Take 1-2 tablets (1-2 mg total) by mouth See admin instructions. Take 1 mg by mouth in the morning, take 1 mg by mouth at 1200, take 1 mg by mouth at 1600 and take 2 mg by mouth at bedtime 09/09/18  Yes Aline August, MD  aspirin 81 MG tablet Take 81  mg by mouth daily.   Yes [provider]  Biotin 10 MG CAPS Take 10 mg by mouth daily.    Yes [provider]  Cholecalciferol (D3-1000) 25 MCG (1000 UT) capsule Take 1,000 Units by mouth daily.    Yes [provider]  CINNAMON PO Take 1,000 mg by mouth 2 (two) times daily.    Yes [provider]  clindamycin (CLEOCIN) 300 MG capsule Take 300 mg by mouth 2 (two) times daily. 01/16/19  Yes [provider]  cyclobenzaprine (FLEXERIL) 10 MG tablet Take 30 mg by mouth at bedtime.    Yes [provider]  Flaxseed, Linseed, (FLAX SEED OIL PO) Take 1,200 mg by mouth daily.   Yes [provider]  ibuprofen (ADVIL) 800 MG tablet Take 1 tablet by mouth 4 (four) times daily as needed. 08/24/18  Yes [provider]  metFORMIN (GLUCOPHAGE) 1000 MG tablet Take 1 tablet by mouth 2 (two) times a day. 06/09/18  Yes [provider]  methylphenidate (RITALIN) 20 MG tablet Take 20 mg by mouth 3 (three) times daily with meals.    Yes [provider]  Multiple Vitamins-Minerals  (OCUVITE ADULT 50+ PO) Take 1 capsule by mouth daily.   Yes [provider]  naproxen sodium (ALEVE) 220 MG tablet Take 440 mg by mouth 2 (two) times daily as needed (for pain or headache).    Yes [provider]  omeprazole (PRILOSEC) 20 MG capsule TAKE (1) CAPSULE BY MOUTH TWICE DAILY. 09/13/18  Yes Mahala Menghini, PA-C  prazosin (MINIPRESS) 1 MG capsule Take 1 mg by mouth at bedtime.   Yes [provider]  rosuvastatin (CRESTOR) 20 MG tablet TAKE (1) TABLET BY MOUTH ONCE DAILY. Patient taking differently: Take 20 mg by mouth daily.  02/11/17  Yes Raylene Everts, MD  venlafaxine XR (EFFEXOR-XR) 150 MG 24 hr capsule Take 150 mg by mouth daily with breakfast.   Yes [provider]  vitamin E 400 UNIT capsule Take 400 Units by mouth daily.   Yes [provider]  B Complex-C (SUPER B COMPLEX PO) Take 1 tablet by mouth daily.    [provider]  glimepiride (AMARYL) 1 MG tablet Take 1 tablet by mouth daily. 08/15/18   [provider]      Allergies  Allergen Reactions   Codeine Other (See Comments)    REACTION: headaches   Dilaudid [Hydromorphone Hcl] Itching   Morphine And Related Itching and Other (See Comments)    Side effect   Sulfonamide Derivatives Rash    ROS:  Out of a complete 14 system review of symptoms, the patient complains only of the following symptoms, and all other reviewed systems are negative.  Visual hallucinations Memory problems Joint pains Decreased hearing  Blood pressure (!) 153/101, pulse 73, temperature (!) 97.4 F (36.3 C), height 5\' 3"  (1.6 m), weight 185 lb 9.6 oz (84.2 kg).  Physical Exam  General: The patient is alert and cooperative at the time of the examination.  Eyes: Pupils are equal, round, and reactive to light. Discs are flat bilaterally.  Neck: The neck is supple, no carotid bruits are noted.  Respiratory: The respiratory examination is clear.  Cardiovascular: The  cardiovascular examination reveals a regular rate and rhythm, no obvious murmurs or rubs are noted.  Skin: Extremities are without significant edema.  Neurologic Exam  Mental status: The patient is alert and oriented x 3 at the time of the examination. The Mini-Mental Status  Examination done today shows a total score 25/30.  Cranial nerves: Facial symmetry is present. There is good sensation of the face to pinprick and soft touch bilaterally. The strength of the facial muscles and the muscles to head turning and shoulder shrug are normal bilaterally. Speech is well enunciated, no aphasia or dysarthria is noted. Extraocular movements are full. Visual fields are full.  The patient will make an occasional area when trying to count fingers.  The tongue is midline, and the patient has symmetric elevation of the soft palate. No obvious hearing deficits are noted.  Motor: The motor testing reveals 5 over 5 strength of all 4 extremities. Good symmetric motor tone is noted throughout.  Sensory: Sensory testing is intact to pinprick, soft touch, vibration sensation, and position sense on all 4 extremities. No evidence of extinction is noted.  Coordination: Cerebellar testing reveals good finger-nose-finger and heel-to-shin bilaterally.  Gait and station: Gait is normal. Tandem gait is normal. Romberg is negative. No drift is seen.  Reflexes: Deep tendon reflexes are symmetric and normal bilaterally. Toes are downgoing bilaterally.   Assessment/Plan:  1. Bipolar disorder  2. Visual hallucinations  3. Reported memory disturbance  The patient has begun to have visual hallucinations.  The patient has bilateral macular degeneration with significant visual impairment, the possibility of a Sherran Needs syndrome needs to be considered.  She reports some troubles with memory, onset of dementia may also be an etiology for visual hallucinations.  She will be sent for some blood work today, we will follow  the memory issues over time.  MRI of the brain will be done.  In the past, she has been told that she has had an abnormal EEG study, she has not had any clinical seizure type events.  Jill Alexanders MD 01/17/2019 1:16 PM  [facility] [address] [place], [place] [address]  Phone [phone] Fax [phone]

## 2019-01-19 ENCOUNTER — Telehealth: Payer: Self-pay

## 2019-01-19 LAB — SEDIMENTATION RATE: Sed Rate: 12 mm/hr (ref 0–40)

## 2019-01-19 LAB — ANA W/REFLEX: Anti Nuclear Antibody (ANA): NEGATIVE

## 2019-01-19 LAB — RPR: RPR Ser Ql: NONREACTIVE

## 2019-01-19 LAB — VITAMIN B12: Vitamin B-12: 783 pg/mL (ref 232–1245)

## 2019-01-19 NOTE — Telephone Encounter (Signed)
-----   Message from Kathrynn Ducking, MD sent at 01/19/2019  1:50 PM EST -----  The blood work results are unremarkable. Please call the patient. ----- Message ----- From: Lavone Neri Lab Results In Sent: 01/18/2019   7:37 AM EST To: Kathrynn Ducking, MD

## 2019-01-19 NOTE — Telephone Encounter (Signed)
I reached out to the patient and attempted to speak with her. Home # and Cell # would not connect and I was unable to leave a vm.  Will try again at a later time.

## 2019-01-22 ENCOUNTER — Emergency Department (HOSPITAL_COMMUNITY)
Admission: EM | Admit: 2019-01-22 | Discharge: 2019-01-22 | Disposition: A | Discharge status: Home / Self Care | Payer: Medicare Other | Attending: Emergency Medicine | Admitting: Emergency Medicine

## 2019-01-22 ENCOUNTER — Emergency Department (HOSPITAL_COMMUNITY): Payer: Medicare Other

## 2019-01-22 ENCOUNTER — Other Ambulatory Visit: Payer: Self-pay

## 2019-01-22 ENCOUNTER — Encounter (HOSPITAL_COMMUNITY): Payer: Self-pay

## 2019-01-22 DIAGNOSIS — K047 Periapical abscess without sinus: Secondary | ICD-10-CM | POA: Insufficient documentation

## 2019-01-22 DIAGNOSIS — I10 Essential (primary) hypertension: Secondary | ICD-10-CM | POA: Diagnosis not present

## 2019-01-22 DIAGNOSIS — Z7984 Long term (current) use of oral hypoglycemic drugs: Secondary | ICD-10-CM | POA: Insufficient documentation

## 2019-01-22 DIAGNOSIS — R600 Localized edema: Secondary | ICD-10-CM | POA: Diagnosis present

## 2019-01-22 DIAGNOSIS — J449 Chronic obstructive pulmonary disease, unspecified: Secondary | ICD-10-CM | POA: Diagnosis not present

## 2019-01-22 DIAGNOSIS — Z79899 Other long term (current) drug therapy: Secondary | ICD-10-CM | POA: Insufficient documentation

## 2019-01-22 DIAGNOSIS — J45909 Unspecified asthma, uncomplicated: Secondary | ICD-10-CM | POA: Diagnosis not present

## 2019-01-22 DIAGNOSIS — E119 Type 2 diabetes mellitus without complications: Secondary | ICD-10-CM | POA: Diagnosis not present

## 2019-01-22 DIAGNOSIS — F17210 Nicotine dependence, cigarettes, uncomplicated: Secondary | ICD-10-CM | POA: Diagnosis not present

## 2019-01-22 DIAGNOSIS — Z7982 Long term (current) use of aspirin: Secondary | ICD-10-CM | POA: Insufficient documentation

## 2019-01-22 LAB — CBC WITH DIFFERENTIAL/PLATELET
Abs Immature Granulocytes: 0.03 10*3/uL (ref 0.00–0.07)
Basophils Absolute: 0.1 10*3/uL (ref 0.0–0.1)
Basophils Relative: 1 %
Eosinophils Absolute: 0 10*3/uL (ref 0.0–0.5)
Eosinophils Relative: 0 %
HCT: 50.1 % — ABNORMAL HIGH (ref 36.0–46.0)
Hemoglobin: 16.1 g/dL — ABNORMAL HIGH (ref 12.0–15.0)
Immature Granulocytes: 0 %
Lymphocytes Relative: 17 %
Lymphs Abs: 1.8 10*3/uL (ref 0.7–4.0)
MCH: 30 pg (ref 26.0–34.0)
MCHC: 32.1 g/dL (ref 30.0–36.0)
MCV: 93.5 fL (ref 80.0–100.0)
Monocytes Absolute: 0.6 10*3/uL (ref 0.1–1.0)
Monocytes Relative: 6 %
Neutro Abs: 8.5 10*3/uL — ABNORMAL HIGH (ref 1.7–7.7)
Neutrophils Relative %: 76 %
Platelets: 296 10*3/uL (ref 150–400)
RBC: 5.36 MIL/uL — ABNORMAL HIGH (ref 3.87–5.11)
RDW: 13.4 % (ref 11.5–15.5)
WBC: 11.1 10*3/uL — ABNORMAL HIGH (ref 4.0–10.5)
nRBC: 0 % (ref 0.0–0.2)

## 2019-01-22 LAB — COMPREHENSIVE METABOLIC PANEL
ALT: 17 U/L (ref 0–44)
AST: 20 U/L (ref 15–41)
Albumin: 4.8 g/dL (ref 3.5–5.0)
Alkaline Phosphatase: 48 U/L (ref 38–126)
Anion gap: 16 — ABNORMAL HIGH (ref 5–15)
BUN: 28 mg/dL — ABNORMAL HIGH (ref 8–23)
CO2: 24 mmol/L (ref 22–32)
Calcium: 9.8 mg/dL (ref 8.9–10.3)
Chloride: 98 mmol/L (ref 98–111)
Creatinine, Ser: 1.25 mg/dL — ABNORMAL HIGH (ref 0.44–1.00)
GFR calc Af Amer: 53 mL/min — ABNORMAL LOW (ref >60)
GFR calc non Af Amer: 45 mL/min — ABNORMAL LOW (ref >60)
Glucose, Bld: 234 mg/dL — ABNORMAL HIGH (ref 70–99)
Potassium: 3.9 mmol/L (ref 3.5–5.1)
Sodium: 138 mmol/L (ref 135–145)
Total Bilirubin: 1.3 mg/dL — ABNORMAL HIGH (ref 0.3–1.2)
Total Protein: 8.1 g/dL (ref 6.5–8.1)

## 2019-01-22 LAB — CBG MONITORING, ED: Glucose-Capillary: 136 mg/dL — ABNORMAL HIGH (ref 70–99)

## 2019-01-22 LAB — LACTIC ACID, PLASMA: Lactic Acid, Venous: 4.1 mmol/L (ref 0.5–1.9)

## 2019-01-22 MED ORDER — FENTANYL CITRATE (PF) 100 MCG/2ML IJ SOLN
50.0000 ug | Freq: Once | INTRAMUSCULAR | Status: AC
  Administered 2019-01-22: 05:00:00 50 ug via INTRAVENOUS
  Filled 2019-01-22: qty 2

## 2019-01-22 MED ORDER — IOHEXOL 300 MG/ML  SOLN
75.0000 mL | Freq: Once | INTRAMUSCULAR | Status: AC | PRN
  Administered 2019-01-22: 60 mL via INTRAVENOUS

## 2019-01-22 MED ORDER — ONDANSETRON HCL 4 MG PO TABS: 4.0000 mg | ORAL_TABLET | Freq: Three times a day (TID) | ORAL | 0 refills | Status: AC | PRN

## 2019-01-22 MED ORDER — PENICILLIN V POTASSIUM 500 MG PO TABS: 500.0000 mg | ORAL_TABLET | Freq: Four times a day (QID) | ORAL | 0 refills | Status: AC

## 2019-01-22 MED ORDER — DOXYCYCLINE HYCLATE 100 MG PO CAPS: 100.0000 mg | ORAL_CAPSULE | Freq: Two times a day (BID) | ORAL | 0 refills | Status: DC

## 2019-01-22 MED ORDER — SODIUM CHLORIDE 0.9 % IV BOLUS
1000.0000 mL | Freq: Once | INTRAVENOUS | Status: AC
  Administered 2019-01-22: 1000 mL via INTRAVENOUS

## 2019-01-22 MED ORDER — DOXYCYCLINE HYCLATE 100 MG PO TABS
100.0000 mg | ORAL_TABLET | Freq: Once | ORAL | Status: AC
  Administered 2019-01-22: 100 mg via ORAL
  Filled 2019-01-22: qty 1

## 2019-01-22 MED ORDER — ONDANSETRON HCL 4 MG/2ML IJ SOLN
4.0000 mg | Freq: Once | INTRAMUSCULAR | Status: AC
  Administered 2019-01-22: 4 mg via INTRAVENOUS
  Filled 2019-01-22: qty 2

## 2019-01-22 MED ORDER — PENICILLIN V POTASSIUM 250 MG PO TABS
500.0000 mg | ORAL_TABLET | Freq: Once | ORAL | Status: AC
  Administered 2019-01-22: 500 mg via ORAL
  Filled 2019-01-22: qty 2

## 2019-01-22 NOTE — ED Triage Notes (Signed)
Pt c/o right jaw pain x5 days. Said she thinks it is a Staph infection. Happened 29th of June. Michela Pitcher it feels the same.

## 2019-01-22 NOTE — ED Provider Notes (Addendum)
[facility] EMERGENCY DEPARTMENT Provider Note   CSN: [id number] Arrival date & time: 01/22/19  0405   Time seen 4:38 AM  History   Chief Complaint Chief Complaint  Patient presents with  . Jaw Pain    right    HPI Adrienne [name] is a 63 y.o. female.     HPI patient states 5 days ago however when we discussed it further it started on November 5.  She saw the dentist and she had a piece of a tooth that was removed.  She states shortly after that she felt like she started having swelling in her mouth.  She had some leftover clindamycin and tried to take it however she states each time she took it she had vomiting.  She has continued to have nausea and vomiting for the past 10 days.  She denies fever or difficulty breathing but states she has been having difficulty swallowing for the past 10 days.  She has supposed to have 4 fillings placed in her teeth this coming week.  She states she has been unable to take even her oral diabetes medications due to vomiting.  Patient was admitted in June with a dental abscess and cellulitis after a dental extraction and failed outpatient treatment with clindamycin that turned out to be strep.  She had ENT consult by Dr. Redmond Baseman and they put in a Penrose drain for right deep space neck abscess.  She was treated with penicillin per ID recommendation.  Past Medical History:  Diagnosis Date  . Allergic rhinitis   . Allergy   . Anemia    leukocytosis/thrombocytosis--Dr. Tressie Stalker  . Anxiety   . Arthritis   . Asthma   . 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 Closed head injury 2005  . Complication of anesthesia    "my blood pressure dropped"  . COPD (chronic obstructive pulmonary disease) ([place])   . Depression    ? Bipolar Disorder--Dr, Jimmye Norman  . Emphysema of lung ([place])   . GERD (gastroesophageal reflux disease)    Barrett's esophagus  . Headache    migraines  . Hx of migraines   . Hypertension   . Low back pain   . Macular degeneration,  bilateral    Dry-Dr. Iona Hansen  . Neuromuscular disorder ([place])   . NIDDM (non-insulin dependent diabetes mellitus)   . OSA (obstructive sleep apnea)    cannot tolerate mask  . RLS (restless legs syndrome)   . Seizure disorder ([place])    ? absence; due to head truama  . Seizures ([place])   . Sleep apnea   . Status post right hip replacement   . Status post rotator cuff surgery   . Visual hallucinations 01/17/2019    Patient Active Problem List   Diagnosis Date Noted  . Visual hallucinations 01/17/2019  . Streptococcal infection group G   . Pyogenic inflammation of bone ([place])   . Poor dentition   . Allergy to antibacterial drug   . Mandibular abscess 09/02/2018  . Hypertension   . Hyperlipidemia associated with type 2 diabetes mellitus ([place])   . Diarrhea 11/25/2017  . Nausea with vomiting 11/25/2017  . Abdominal distention 11/25/2017  . Fecal smearing 07/08/2017  . Status post bilateral total hip replacement 08/27/2016  . Emphysema of lung ([place]) 05/22/2016  . Atherosclerosis of aorta ([place]) 05/22/2016  . GAD (generalized anxiety disorder) 05/22/2016  . Mucosal abnormality of stomach   . Bipolar 1 disorder ([place]) 05/15/2014  . History of smoking  30 or more pack years 05/15/2014  . Barrett's esophagus 04/03/2010  . SHOULDER, ARTHRITIS, DEGEN./OSTEO 03/06/2009  . RUPTURE ROTATOR CUFF 03/06/2009  . Type 2 diabetes mellitus without complications ([place]) [id number]  . H N P-LUMBAR 06/29/2007  . RESTLESS LEG SYNDROME 05/18/2007  . SLEEP APNEA 05/18/2007  . Headache(784.0) 05/18/2007  . SCIATICA 03/31/2007  . Anxiety and depression 03/28/2007  . MACULAR DEGENERATION 03/28/2007  . ALLERGIC RHINITIS 03/28/2007  . GERD 03/28/2007  . LOW BACK PAIN 03/28/2007  . SEIZURE DISORDER 03/28/2007  . HEAD TRAUMA, CLOSED 03/28/2007    Past Surgical History:  Procedure Laterality Date  . ABDOMINAL HYSTERECTOMY     heavy bleeding  . APPENDECTOMY    . BIOPSY  12/27/2014   Procedure: Esophageal  BIOPSY;  Surgeon: Daneil Dolin, MD;  Location: [facility] OR[facility];  Service: Endoscopy;;  . BIOPSY  02/17/2018   Procedure: BIOPSY;  Surgeon: Daneil Dolin, MD;  Location: [facility] END[facility];  Service: Endoscopy;;  (gastric)  . COLONOSCOPY WITH ESOPHAGOGASTRODUODENOSCOPY (EGD)  12/08   TCS/EGD: + Barrett's,+ gastritis, friable anal canal, hyperplastic polyp  . COLONOSCOPY WITH PROPOFOL N/A 12/27/2014   RMR: Internal hemorrhoids likely source of hematochezia. Redundant colon.   . ESOPHAGOGASTRODUODENOSCOPY  11/16/2002   Dr.Rourk- salmon-colored epithelium in the upper esophageal sphincter previous bx= ectopic gastric mucosa, salmon colored epithelium at the distal esophagus proviously bx= barretts esophagus o/w the esophageal mucosa appeared normal. patulous esophagogastric junction, small hiatal hernia, gastric mucosa o/w appeared normal. normal D1 and D2  . ESOPHAGOGASTRODUODENOSCOPY (EGD) WITH PROPOFOL N/A 12/27/2014   RMR: Abnoraml distal esophagus consistiant with prior diagnosis fo Barretts esophagus- Status post biopsy. Hiatal hernia. Antral erosions status post gastric biopsy,  . ESOPHAGOGASTRODUODENOSCOPY (EGD) WITH PROPOFOL N/A 02/17/2018   Procedure: ESOPHAGOGASTRODUODENOSCOPY (EGD) WITH PROPOFOL;  Surgeon: Daneil Dolin, MD;  Location: [facility];  Service: Endoscopy;  Laterality: N/A;  1:30pm  . FRACTURE SURGERY Left 03/07/2013   knee  . fx left knee Left 2014  . INCISION AND DRAINAGE ABSCESS Right 09/03/2018   Procedure: INCISION AND DRAINAGE RIGHT NECK ABSCESS;  Surgeon: Melida Quitter, MD;  Location: [place];  Service: ENT;  Laterality: Right;  . ORIF TIBIA PLATEAU Left 03/07/2013   Procedure: OPEN REDUCTION INTERNAL FIXATION (ORIF) TIBIAL PLATEAU;  Surgeon: Renette Butters, MD;  Location: [place];  Service: Orthopedics;  Laterality: Left;  . REVISION TOTAL HIP ARTHROPLASTY     Right  . ROTATOR CUFF REPAIR     Bilaterally  . SHOULDER INJECTION Left 03/07/2013   Procedure: SHOULDER INJECTION  ; Depomedrol + 0.25% Marcaine mixture 4:1;  Surgeon: Renette Butters, MD;  Location: [place];  Service: Orthopedics;  Laterality: Left;  . TONSILLECTOMY    . TOTAL HIP ARTHROPLASTY Left 06/01/2016   Procedure: LEFT TOTAL HIP ARTHROPLASTY ANTERIOR APPROACH;  Surgeon: Gaynelle Arabian, MD;  Location: [facility];  Service: Orthopedics;  Laterality: Left;  [name] VESICOVAGINAL FISTULA CLOSURE W/ TAH       OB History    Gravida  3   Para  3   Term  3   Preterm      AB      Living        SAB      TAB      Ectopic      Multiple      Live Births               Home Medications    Prior to Admission medications  Medication Sig Start Date End Date Taking? Authorizing Provider  ALPRAZolam Duanne Moron) 1 MG tablet Take 1-2 tablets (1-2 mg total) by mouth See admin instructions. Take 1 mg by mouth in the morning, take 1 mg by mouth at 1200, take 1 mg by mouth at 1600 and take 2 mg by mouth at bedtime 09/09/18   Aline August, MD  aspirin 81 MG tablet Take 81 mg by mouth daily.    [provider]  B Complex-C (SUPER B COMPLEX PO) Take 1 tablet by mouth daily.    [provider]  Biotin 10 MG CAPS Take 10 mg by mouth daily.     [provider]  Cholecalciferol (D3-1000) 25 MCG (1000 UT) capsule Take 1,000 Units by mouth daily.     [provider]  CINNAMON PO Take 1,000 mg by mouth 2 (two) times daily.     [provider]  clindamycin (CLEOCIN) 300 MG capsule Take 300 mg by mouth 2 (two) times daily. 01/16/19   [provider]  cyclobenzaprine (FLEXERIL) 10 MG tablet Take 30 mg by mouth at bedtime.     [provider]  doxycycline (VIBRAMYCIN) 100 MG capsule Take 1 capsule (100 mg total) by mouth 2 (two) times daily. 01/22/19   Rolland Porter, MD  Flaxseed, Linseed, (FLAX SEED OIL PO) Take 1,200 mg by mouth daily.    [provider]  glimepiride (AMARYL) 1 MG tablet Take 1 tablet by mouth daily. 08/15/18   [provider]   ibuprofen (ADVIL) 800 MG tablet Take 1 tablet by mouth 4 (four) times daily as needed. 08/24/18   [provider]  metFORMIN (GLUCOPHAGE) 1000 MG tablet Take 1 tablet by mouth 2 (two) times a day. 06/09/18   [provider]  methylphenidate (RITALIN) 20 MG tablet Take 20 mg by mouth 3 (three) times daily with meals.     [provider]  Multiple Vitamins-Minerals (OCUVITE ADULT 50+ PO) Take 1 capsule by mouth daily.    [provider]  naproxen sodium (ALEVE) 220 MG tablet Take 440 mg by mouth 2 (two) times daily as needed (for pain or headache).     [provider]  omeprazole (PRILOSEC) 20 MG capsule TAKE (1) CAPSULE BY MOUTH TWICE DAILY. 09/13/18   Mahala Menghini, PA-C  ondansetron (ZOFRAN) 4 MG tablet Take 1 tablet (4 mg total) by mouth every 8 (eight) hours as needed for nausea or vomiting. 01/22/19   Rolland Porter, MD  penicillin v potassium (VEETID) 500 MG tablet Take 1 tablet (500 mg total) by mouth 4 (four) times daily for 10 days. 01/22/19 02/01/19  Rolland Porter, MD  prazosin (MINIPRESS) 1 MG capsule Take 1 mg by mouth at bedtime.    [provider]  rosuvastatin (CRESTOR) 20 MG tablet TAKE (1) TABLET BY MOUTH ONCE DAILY. Patient taking differently: Take 20 mg by mouth daily.  02/11/17   Raylene Everts, MD  venlafaxine XR (EFFEXOR-XR) 150 MG 24 hr capsule Take 150 mg by mouth daily with breakfast.    [provider]  vitamin E 400 UNIT capsule Take 400 Units by mouth daily.    [provider]    Family History Family History  Problem Relation Age of Onset  . Heart disease Father 27  . Atrial fibrillation Mother   . Cancer Mother        carcinoid  . Diabetes Maternal Grandfather   . Cancer Maternal Grandfather   . Heart disease Maternal Grandmother   .  Thyroid disease Son     Social History Social History   Tobacco Use  . Smoking status: Current Some Day Smoker    Packs/day: 0.50    Years: 40.00    Pack  years: 20.00    Types: Cigarettes    Start date: 03/03/1971  . Smokeless tobacco: Never Used  Substance Use Topics  . Alcohol use: No  . Drug use: No     Allergies   Codeine, Dilaudid [hydromorphone hcl], Morphine and related, and Sulfonamide derivatives   Review of Systems Review of Systems  All other systems reviewed and are negative.    Physical Exam Updated Vital Signs BP 125/73   Pulse 66   Temp 98.6 F (37 C) (Oral)   Resp 16   Wt 84.2 kg   SpO2 92%   BMI 32.88 kg/m   Physical Exam Vitals signs and nursing note reviewed.  Constitutional:      Appearance: Normal appearance.  HENT:     Head: Normocephalic and atraumatic.     Right Ear: External ear normal.     Left Ear: External ear normal.     Mouth/Throat:     Mouth: Mucous membranes are moist.     Pharynx: Posterior oropharyngeal erythema present. No oropharyngeal exudate.     Comments: She appears to have some swelling of her right inner cheek and posterior gum where there are no teeth. Eyes:     Extraocular Movements: Extraocular movements intact.     Conjunctiva/sclera: Conjunctivae normal.     Pupils: Pupils are equal, round, and reactive to light.  Neck:     Musculoskeletal: Neck supple. Muscular tenderness present.     Comments: I do not appreciate any obvious swelling on the right side of her neck, the skin does appear to be thickened on the right side.  There is a scar from where she had the I&D done. Cardiovascular:     Rate and Rhythm: Normal rate.  Pulmonary:     Effort: Pulmonary effort is normal. No respiratory distress.  Musculoskeletal: Normal range of motion.  Skin:    General: Skin is warm and dry.  Neurological:     General: No focal deficit present.     Mental Status: She is alert and oriented to person, place, and time.     Cranial Nerves: No cranial nerve deficit.  Psychiatric:        Mood and Affect: Mood normal.        Behavior: Behavior normal.        Thought Content:  Thought content normal.      ED Treatments / Results  Labs (all labs ordered are listed, but only abnormal results are displayed) Results for orders placed or performed during the hospital encounter of 01/22/19  Comprehensive metabolic panel  Result Value Ref Range   Sodium 138 135 - 145 mmol/L   Potassium 3.9 3.5 - 5.1 mmol/L   Chloride 98 98 - 111 mmol/L   CO2 24 22 - 32 mmol/L   Glucose, Bld 234 (H) 70 - 99 mg/dL   BUN 28 (H) 8 - 23 mg/dL   Creatinine, Ser 1.25 (H) 0.44 - 1.00 mg/dL   Calcium 9.8 8.9 - 10.3 mg/dL   Total Protein 8.1 6.5 - 8.1 g/dL   Albumin 4.8 3.5 - 5.0 g/dL   AST 20 15 - 41 U/L   ALT 17 0 - 44 U/L   Alkaline Phosphatase 48 38 - 126 U/L   Total Bilirubin 1.3 (  H) 0.3 - 1.2 mg/dL   GFR calc non Af Amer 45 (L) >60 mL/min   GFR calc Af Amer 53 (L) >60 mL/min   Anion gap 16 (H) 5 - 15  CBC with Differential  Result Value Ref Range   WBC 11.1 (H) 4.0 - 10.5 K/uL   RBC 5.36 (H) 3.87 - 5.11 MIL/uL   Hemoglobin 16.1 (H) 12.0 - 15.0 g/dL   HCT 50.1 (H) 36.0 - 46.0 %   MCV 93.5 80.0 - 100.0 fL   MCH 30.0 26.0 - 34.0 pg   MCHC 32.1 30.0 - 36.0 g/dL   RDW 13.4 11.5 - 15.5 %   Platelets 296 150 - 400 K/uL   nRBC 0.0 0.0 - 0.2 %   Neutrophils Relative % 76 %   Neutro Abs 8.5 (H) 1.7 - 7.7 K/uL   Lymphocytes Relative 17 %   Lymphs Abs 1.8 0.7 - 4.0 K/uL   Monocytes Relative 6 %   Monocytes Absolute 0.6 0.1 - 1.0 K/uL   Eosinophils Relative 0 %   Eosinophils Absolute 0.0 0.0 - 0.5 K/uL   Basophils Relative 1 %   Basophils Absolute 0.1 0.0 - 0.1 K/uL   Immature Granulocytes 0 %   Abs Immature Granulocytes 0.03 0.00 - 0.07 K/uL  Lactic acid, plasma  Result Value Ref Range   Lactic Acid, Venous 4.1 (HH) 0.5 - 1.9 mmol/L   Laboratory interpretation all normal except elevated lactic acid, hyperglycemia with minimal elevation of anion gap of 1, leukocytosis, elevated hemoglobin consistent with dehydration    EKG None  Radiology Ct Soft Tissue Neck W  Contrast  Result Date: 01/22/2019 CLINICAL DATA:  Right jaw pain 5 days. History of dental abscess June 2020 EXAM: CT NECK WITH CONTRAST TECHNIQUE: Multidetector CT imaging of the neck was performed using the standard protocol following the bolus administration of intravenous contrast. CONTRAST:  55mL OMNIPAQUE IOHEXOL 300 MG/ML  SOLN COMPARISON:  CT neck 09/02/2018 FINDINGS: Pharynx and larynx: Normal. No mass or swelling. Negative for soft tissue swelling or abscess. Salivary glands: No inflammation, mass, or stone. Thyroid: Negative Lymph nodes: No enlarged lymph nodes in the neck. Vascular: Normal vascular enhancement. Limited intracranial: Negative Visualized orbits: Negative Mastoids and visualized paranasal sinuses: Negative Skeleton: Cervical spondylosis without acute abnormality. No acute dental infection. Upper chest: Left upper lobe scarring. No acute abnormality in the apices. Apical emphysema bilaterally. Other: Negative for soft tissue edema in the neck. No evidence of acute cellulitis or abscess. IMPRESSION: Negative CT neck.  No evidence of cellulitis or abscess. Electronically Signed   By: Franchot Gallo M.D.   On: 01/22/2019 06:34    Procedures Procedures (including critical care time)  Medications Ordered in ED Medications  penicillin v potassium (VEETID) tablet 500 mg (has no administration in time range)  doxycycline (VIBRA-TABS) tablet 100 mg (has no administration in time range)  sodium chloride 0.9 % bolus 1,000 mL (1,000 mLs Intravenous New Bag/Given 01/22/19 0509)  fentaNYL (SUBLIMAZE) injection 50 mcg (50 mcg Intravenous Given 01/22/19 0509)  ondansetron (ZOFRAN) injection 4 mg (4 mg Intravenous Given 01/22/19 0509)  iohexol (OMNIPAQUE) 300 MG/ML solution 75 mL (60 mLs Intravenous Contrast Given 01/22/19 0603)  sodium chloride 0.9 % bolus 1,000 mL (1,000 mLs Intravenous New Bag/Given 01/22/19 [id number])     Initial Impression / Assessment and Plan / ED Course  I have  reviewed the triage vital signs and the nursing notes.  Pertinent labs & imaging results that were available during my  care of the patient were reviewed by me and considered in my medical decision making (see chart for details).       Patient was given IV fluids, she was given IV pain medication.  CT with contrast was done to see if she has recurrent deep space abscess.  Patient is feeling better after IV fluids.  She is stating she feels like she can eat now.  She was started on oral penicillin and doxycycline, to cover both staph and strep.  Patient was sitting on the side of the stretcher in no distress.  She appears to be feeling better.  Patient CBG is 136 just with IV fluids.  Review the [facility] shows patient gets #150 alprazolam 1 mg tablets monthly, last filled October 6, and #90 methylphenidate 20 mg tablets last filled September 15  Final Clinical Impressions(s) / ED Diagnoses   Final diagnoses:  Dental infection    ED Discharge Orders         Ordered    penicillin v potassium (VEETID) 500 MG tablet  4 times daily     01/22/19 0651    doxycycline (VIBRAMYCIN) 100 MG capsule  2 times daily     01/22/19 0651    ondansetron (ZOFRAN) 4 MG tablet  Every 8 hours PRN     01/22/19 0651          Plan discharge  Rolland Porter, MD, Barbette Or, MD 01/22/19 Orson Eva    Rolland Porter, MD 01/22/19 [phone]

## 2019-01-22 NOTE — ED Notes (Signed)
Pt given emesis bag for nausea

## 2019-01-22 NOTE — Discharge Instructions (Addendum)
Drink plenty of fluids.  Monitor your blood sugar closely because it was mildly elevated tonight at 234.  Use the Zofran for nausea or vomiting.  Take the antibiotics until gone.  Keep your appointments with the dentist this week.  Recheck if you get a high fever, get a lot of swelling and you are unable to swallow or have difficulty breathing.

## 2019-01-22 NOTE — ED Notes (Signed)
Date and time results received: 01/22/19 5:39 AM  (use smartphrase ".now" to insert current time)  Test: lactic Critical Value: 4.1  Name of Provider Notified: knapp   Orders Received? Or Actions Taken?: see orders

## 2019-01-23 ENCOUNTER — Encounter: Payer: Self-pay | Admitting: Internal Medicine

## 2019-01-23 NOTE — Telephone Encounter (Signed)
Lvm asking pt to call back so we would review lab results.

## 2019-01-23 NOTE — Telephone Encounter (Signed)
Error

## 2019-01-24 NOTE — Telephone Encounter (Signed)
Pt mother(on DPR) has called for the status of the CT scan being ordered for pt.  She is asking for a call

## 2019-01-24 NOTE — Telephone Encounter (Signed)
Pt's mother called back and I advised again on note from RN.

## 2019-01-24 NOTE — Telephone Encounter (Signed)
I reached out to the pt's mom and advised our schedulers will be in touch about the MRI once they have received the authorization back from the insurance.  Patient's mom is going to advise on the normal blood tests to Ms. Rozzell.

## 2019-02-08 ENCOUNTER — Other Ambulatory Visit: Payer: Self-pay

## 2019-02-08 ENCOUNTER — Encounter (INDEPENDENT_AMBULATORY_CARE_PROVIDER_SITE_OTHER): Payer: Medicare Other | Admitting: Ophthalmology

## 2019-02-08 DIAGNOSIS — I10 Essential (primary) hypertension: Secondary | ICD-10-CM | POA: Diagnosis not present

## 2019-02-08 DIAGNOSIS — H35033 Hypertensive retinopathy, bilateral: Secondary | ICD-10-CM | POA: Diagnosis not present

## 2019-02-08 DIAGNOSIS — D3132 Benign neoplasm of left choroid: Secondary | ICD-10-CM

## 2019-02-08 DIAGNOSIS — H353231 Exudative age-related macular degeneration, bilateral, with active choroidal neovascularization: Secondary | ICD-10-CM | POA: Diagnosis not present

## 2019-02-08 DIAGNOSIS — H43813 Vitreous degeneration, bilateral: Secondary | ICD-10-CM

## 2019-03-06 ENCOUNTER — Other Ambulatory Visit: Payer: Medicare Other

## 2019-03-14 ENCOUNTER — Other Ambulatory Visit: Payer: Self-pay

## 2019-03-14 ENCOUNTER — Encounter (INDEPENDENT_AMBULATORY_CARE_PROVIDER_SITE_OTHER): Payer: Medicare Other | Admitting: Ophthalmology

## 2019-03-14 DIAGNOSIS — H353231 Exudative age-related macular degeneration, bilateral, with active choroidal neovascularization: Secondary | ICD-10-CM

## 2019-03-14 DIAGNOSIS — H43813 Vitreous degeneration, bilateral: Secondary | ICD-10-CM | POA: Diagnosis not present

## 2019-03-14 DIAGNOSIS — H35033 Hypertensive retinopathy, bilateral: Secondary | ICD-10-CM | POA: Diagnosis not present

## 2019-03-14 DIAGNOSIS — I10 Essential (primary) hypertension: Secondary | ICD-10-CM

## 2019-03-14 DIAGNOSIS — D3132 Benign neoplasm of left choroid: Secondary | ICD-10-CM

## 2019-03-21 ENCOUNTER — Ambulatory Visit (INDEPENDENT_AMBULATORY_CARE_PROVIDER_SITE_OTHER): Payer: Medicare PPO | Admitting: Internal Medicine

## 2019-03-21 ENCOUNTER — Encounter: Payer: Self-pay | Admitting: Internal Medicine

## 2019-03-21 ENCOUNTER — Other Ambulatory Visit: Payer: Self-pay

## 2019-03-21 ENCOUNTER — Other Ambulatory Visit: Payer: Self-pay | Admitting: *Deleted

## 2019-03-21 VITALS — BP 157/107 | HR 81 | Temp 98.2°F | Ht 63.0 in | Wt 179.8 lb

## 2019-03-21 DIAGNOSIS — K219 Gastro-esophageal reflux disease without esophagitis: Secondary | ICD-10-CM

## 2019-03-21 DIAGNOSIS — R151 Fecal smearing: Secondary | ICD-10-CM | POA: Diagnosis not present

## 2019-03-21 DIAGNOSIS — K2270 Barrett's esophagus without dysplasia: Secondary | ICD-10-CM | POA: Diagnosis not present

## 2019-03-21 NOTE — Patient Instructions (Signed)
Continue omeprazole 20 mg twice daily  Information on [place] excercises - refer to physical therapy  Repeat EGD in 2022  Screening colonoscopy 2026  Ov here in 6 months

## 2019-03-21 NOTE — Progress Notes (Signed)
Primary Care Physician:  Asencion Noble, MD Primary Gastroenterologist:  Dr. Gala Romney  Pre-Procedure History & Physical: HPI:  Adrienne [name] is a 64 y.o. female here for follow-up of GERD /  Barrett's esophagus.  Occasional incontinence. Reflux well controlled on omeprazole 20 mg twice daily.  Due for surveillance EGD next year.  Patient states bowel function is good almost the entirety of any given month with the average of one episode during that timeframe.  Infrequently, she has a bowel movement -and does not know she has had it.  Has to wear depends.  Episodes again about once monthly.  She is a diabetic.  History of normal vaginal deliveries.  She does take Glucophage. She has had significant non-GI issues dealing with progressing macular degeneration and a dental abscess.   Past Medical History:  Diagnosis Date  . Allergic rhinitis   . Allergy   . Anemia    leukocytosis/thrombocytosis--Dr. Tressie Stalker  . Anxiety   . Arthritis   . Asthma   . 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 Closed head injury 2005  . Complication of anesthesia    "my blood pressure dropped"  . COPD (chronic obstructive pulmonary disease) ([place])   . Depression    ? Bipolar Disorder--Dr, Jimmye Norman  . Emphysema of lung ([place])   . GERD (gastroesophageal reflux disease)    Barrett's esophagus  . Headache    migraines  . Hx of migraines   . Hypertension   . Low back pain   . Macular degeneration, bilateral    Dry-Dr. Iona Hansen  . Neuromuscular disorder ([place])   . NIDDM (non-insulin dependent diabetes mellitus)   . OSA (obstructive sleep apnea)    cannot tolerate mask  . RLS (restless legs syndrome)   . Seizure disorder ([place])    ? absence; due to head truama  . Seizures ([place])   . Sleep apnea   . Status post right hip replacement   . Status post rotator cuff surgery   . Visual hallucinations 01/17/2019    Past Surgical History:  Procedure Laterality Date  . ABDOMINAL HYSTERECTOMY     heavy bleeding  .  APPENDECTOMY    . BIOPSY  12/27/2014   Procedure: Esophageal BIOPSY;  Surgeon: Daneil Dolin, MD;  Location: [facility];  Service: Endoscopy;;  . BIOPSY  02/17/2018   Procedure: BIOPSY;  Surgeon: Daneil Dolin, MD;  Location: [facility];  Service: Endoscopy;;  (gastric)  . COLONOSCOPY WITH ESOPHAGOGASTRODUODENOSCOPY (EGD)  12/08   TCS/EGD: + Barrett's,+ gastritis, friable anal canal, hyperplastic polyp  . COLONOSCOPY WITH PROPOFOL N/A 12/27/2014   RMR: Internal hemorrhoids likely source of hematochezia. Redundant colon.   . ESOPHAGOGASTRODUODENOSCOPY  11/16/2002   Dr.Lanyiah Brix- salmon-colored epithelium in the upper esophageal sphincter previous bx= ectopic gastric mucosa, salmon colored epithelium at the distal esophagus proviously bx= barretts esophagus o/w the esophageal mucosa appeared normal. patulous esophagogastric junction, small hiatal hernia, gastric mucosa o/w appeared normal. normal D1 and D2  . ESOPHAGOGASTRODUODENOSCOPY (EGD) WITH PROPOFOL N/A 12/27/2014   RMR: Abnoraml distal esophagus consistiant with prior diagnosis fo Barretts esophagus- Status post biopsy. Hiatal hernia. Antral erosions status post gastric biopsy,  . ESOPHAGOGASTRODUODENOSCOPY (EGD) WITH PROPOFOL N/A 02/17/2018   Procedure: ESOPHAGOGASTRODUODENOSCOPY (EGD) WITH PROPOFOL;  Surgeon: Daneil Dolin, MD;  [facility]: [facility];  Service: Endoscopy;  Laterality: N/A;  1:30pm  . FRACTURE SURGERY Left 03/07/2013   knee  . fx left knee Left 2014  . INCISION AND  DRAINAGE ABSCESS Right 09/03/2018   Procedure: INCISION AND DRAINAGE RIGHT NECK ABSCESS;  Surgeon: Melida Quitter, MD;  Location: [facility];  Service: ENT;  Laterality: Right;  . ORIF TIBIA PLATEAU Left 03/07/2013   Procedure: OPEN REDUCTION INTERNAL FIXATION (ORIF) TIBIAL PLATEAU;  Surgeon: Renette Butters, MD;  Location: [facility];  Service: Orthopedics;  Laterality: Left;  . REVISION TOTAL HIP ARTHROPLASTY     Right  . ROTATOR CUFF REPAIR     Bilaterally  .  SHOULDER INJECTION Left 03/07/2013   Procedure: SHOULDER INJECTION ; Depomedrol + 0.25% Marcaine mixture 4:1;  Surgeon: Renette Butters, MD;  Location: [facility];  Service: Orthopedics;  Laterality: Left;  . TONSILLECTOMY    . TOTAL HIP ARTHROPLASTY Left 06/01/2016   Procedure: LEFT TOTAL HIP ARTHROPLASTY ANTERIOR APPROACH;  Surgeon: Gaynelle Arabian, MD;  Location: [facility];  Service: Orthopedics;  Laterality: Left;  [name] VESICOVAGINAL FISTULA CLOSURE W/ TAH      Prior to Admission medications   Medication Sig Start Date End Date Taking? Authorizing Provider  acetaminophen (TYLENOL) 500 MG tablet Take 1,000 mg by mouth every 6 (six) hours as needed.   Yes [provider]  ALPRAZolam Duanne Moron) 1 MG tablet Take 1-2 tablets (1-2 mg total) by mouth See admin instructions. Take 1 mg by mouth in the morning, take 1 mg by mouth at 1200, take 1 mg by mouth at 1600 and take 2 mg by mouth at bedtime 09/09/18  Yes Aline August, MD  aspirin 81 MG tablet Take 81 mg by mouth daily.   Yes [provider]  B Complex-C (SUPER B COMPLEX PO) Take 1 tablet by mouth daily.   Yes [provider]  Cholecalciferol (D3-1000) 25 MCG (1000 UT) capsule Take 1,000 Units by mouth daily.    Yes [provider]  CINNAMON PO Take 1,000 mg by mouth 2 (two) times daily.    Yes [provider]  cyclobenzaprine (FLEXERIL) 10 MG tablet Take 30 mg by mouth at bedtime.    Yes [provider]  Flaxseed, Linseed, (FLAX SEED OIL PO) Take 1,200 mg by mouth daily.   Yes [provider]  glimepiride (AMARYL) 1 MG tablet Take 1 tablet by mouth daily. 08/15/18  Yes [provider]  metFORMIN (GLUCOPHAGE) 1000 MG tablet Take 1 tablet by mouth 2 (two) times a day. 06/09/18  Yes [provider]  methylphenidate (RITALIN) 20 MG tablet Take 20 mg by mouth 3 (three) times daily with meals.    Yes [provider]  Multiple Vitamins-Minerals (OCUVITE ADULT 50+ PO) Take 1 capsule  by mouth 2 (two) times daily.    Yes [provider]  omeprazole (PRILOSEC) 20 MG capsule TAKE (1) CAPSULE BY MOUTH TWICE DAILY. 09/13/18  Yes Mahala Menghini, PA-C  ondansetron (ZOFRAN) 4 MG tablet Take 1 tablet (4 mg total) by mouth every 8 (eight) hours as needed for nausea or vomiting. 01/22/19  Yes Rolland Porter, MD  prazosin (MINIPRESS) 1 MG capsule Take 1 mg by mouth at bedtime.   Yes [provider]  rosuvastatin (CRESTOR) 20 MG tablet TAKE (1) TABLET BY MOUTH ONCE DAILY. Patient taking differently: Take 20 mg by mouth daily.  02/11/17  Yes Raylene Everts, MD  venlafaxine XR (EFFEXOR-XR) 150 MG 24 hr capsule Take 150 mg by mouth daily with breakfast.   Yes [provider]  vitamin E 400 UNIT capsule Take 400 Units by mouth. Takes occassionally   Yes [provider]  Biotin 10 MG CAPS Take 10 mg by mouth daily.     [provider]  clindamycin (CLEOCIN) 300 MG capsule Take 300 mg by mouth 2 (two) times daily. 01/16/19   [provider]  doxycycline (VIBRAMYCIN) 100 MG capsule Take 1 capsule (100 mg total) by mouth 2 (two) times daily. 01/22/19   Rolland Porter, MD  ibuprofen (ADVIL) 800 MG tablet Take 1 tablet by mouth 4 (four) times daily as needed. 08/24/18   [provider]  naproxen sodium (ALEVE) 220 MG tablet Take 440 mg by mouth 2 (two) times daily as needed (for pain or headache).     [provider]    Allergies as of 03/21/2019 - Review Complete 03/21/2019  Allergen Reaction Noted  . Codeine Other (See Comments)   . Dilaudid [hydromorphone hcl] Itching 05/23/2013  . Morphine and related Itching and Other (See Comments) 03/02/2013  . Sulfonamide derivatives Rash     Family History  Problem Relation Age of Onset  . Heart disease Father 41  . Atrial fibrillation Mother   . Cancer Mother        carcinoid  . Diabetes Maternal Grandfather   . Cancer Maternal Grandfather   . Heart disease Maternal Grandmother     . Thyroid disease Son     Social History   Socioeconomic History  . Marital status: Divorced    Spouse name: Not on file  . Number of children: 3  . Years of education: 87  . Highest education level: Not on file  Occupational History  . Occupation: on disability    Employer: UNEMPLOYED    Comment: due to head injury, 2005  Tobacco Use  . Smoking status: Current Some Day Smoker    Packs/day: 0.50    Years: 40.00    Pack years: 20.00    Types: Cigarettes    Start date: 03/03/1971  . Smokeless tobacco: Never Used  Substance and Sexual Activity  . Alcohol use: No  . Drug use: No  . Sexual activity: Yes    Birth control/protection: Surgical  Other Topics Concern  . Not on file  Social History Narrative   Right handed   Caffeine use: soda    Lives alone   Two big dogs   Enjoys yard work, home improvement   Social Determinants of Health   Financial Resource Strain:   . Difficulty of Paying Living Expenses: Not on file  Food Insecurity:   . Worried About Charity fundraiser in the Last Year: Not on file  . Ran Out of Food in the Last Year: Not on file  Transportation Needs:   . Lack of Transportation (Medical): Not on file  . Lack of Transportation (Non-Medical): Not on file  Physical Activity:   . Days of Exercise per Week: Not on file  . Minutes of Exercise per Session: Not on file  Stress:   . Feeling of Stress : Not on file  Social Connections:   . Frequency of Communication with Friends and Family: Not on file  . Frequency of Social Gatherings with Friends and Family: Not on file  . Attends Religious Services: Not on file  . Active Member of Clubs or Organizations: Not on file  . Attends Archivist Meetings: Not on file  . Marital Status: Not on file  Intimate Partner Violence:   . Fear of Current or Ex-Partner: Not on file  . Emotionally Abused: Not on file  . Physically Abused: Not on  file  . Sexually Abused: Not on file    Review of  Systems: See HPI, otherwise negative ROS  Physical Exam: BP (!) 157/107   Pulse 81   Temp 98.2 F (36.8 C) (Temporal)   Ht 5\' 3"  (1.6 m)   Wt 179 lb 12.8 oz (81.6 kg)   BMI 31.85 kg/m  General:   Alert,  Well-developed, well-nourished, pleasant and cooperative in NAD Heart:  Regular rate and rhythm; no murmurs, clicks, rubs,  or gallops. Abdomen: Non-distended, normal bowel sounds.  Soft and nontender without appreciable mass or hepatosplenomegaly.  Pulses:  Normal pulses noted. Extremities:  Without clubbing or edema.  Impression/Plan: 64 year old lady history of Barrett's esophagus -  no dysplasia.  Reflux symptoms well controlled on omeprazole 20 mg twice daily.  Relatively infrequent episodes of rectal incontinence.  Likely related to lax pelvic floor musculature.  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 She does well the vast majority of time but when she has a episode of incontinence this is obviously disconcerting.  I suspect she would benefit from pelvic floor muscle strengthening exercises  Recommendations:  Continue omeprazole 20 mg twice daily  Information on Kegel excercises - refer to physical therapy  Repeat EGD in 2022  Screening colonoscopy 2026  Ov here in 6 months       Notice: This dictation was prepared with Dragon dictation along with smaller phrase technology. Any transcriptional errors that result from this process are unintentional and may not be corrected upon review.

## 2019-03-29 ENCOUNTER — Other Ambulatory Visit: Payer: Self-pay

## 2019-03-29 ENCOUNTER — Ambulatory Visit
Admission: RE | Admit: 2019-03-29 | Discharge: 2019-03-29 | Disposition: A | Discharge status: Home / Self Care | Payer: Medicare PPO | Source: Ambulatory Visit | Attending: Neurology | Admitting: Neurology

## 2019-03-29 DIAGNOSIS — R413 Other amnesia: Secondary | ICD-10-CM | POA: Diagnosis not present

## 2019-03-29 MED ORDER — GADOBENATE DIMEGLUMINE 529 MG/ML IV SOLN
15.0000 mL | Freq: Once | INTRAVENOUS | Status: AC | PRN
  Administered 2019-03-29: 15 mL via INTRAVENOUS

## 2019-03-30 ENCOUNTER — Telehealth: Payer: Self-pay | Admitting: Neurology

## 2019-03-30 NOTE — Telephone Encounter (Signed)
I called the patient.  MRI of the brain was normal.   MRI brain 03/30/19:  IMPRESSION:   Normal MRI brain (with and without).

## 2019-04-03 ENCOUNTER — Other Ambulatory Visit: Payer: Self-pay | Admitting: Gastroenterology

## 2019-04-11 ENCOUNTER — Encounter (INDEPENDENT_AMBULATORY_CARE_PROVIDER_SITE_OTHER): Payer: Medicare PPO | Admitting: Ophthalmology

## 2019-04-11 ENCOUNTER — Other Ambulatory Visit: Payer: Self-pay

## 2019-04-11 DIAGNOSIS — H353231 Exudative age-related macular degeneration, bilateral, with active choroidal neovascularization: Secondary | ICD-10-CM

## 2019-04-11 DIAGNOSIS — I10 Essential (primary) hypertension: Secondary | ICD-10-CM | POA: Diagnosis not present

## 2019-04-11 DIAGNOSIS — H43813 Vitreous degeneration, bilateral: Secondary | ICD-10-CM | POA: Diagnosis not present

## 2019-04-11 DIAGNOSIS — H35033 Hypertensive retinopathy, bilateral: Secondary | ICD-10-CM

## 2019-04-11 DIAGNOSIS — D3132 Benign neoplasm of left choroid: Secondary | ICD-10-CM

## 2019-04-20 DIAGNOSIS — D2262 Melanocytic nevi of left upper limb, including shoulder: Secondary | ICD-10-CM | POA: Diagnosis not present

## 2019-04-20 DIAGNOSIS — B353 Tinea pedis: Secondary | ICD-10-CM | POA: Diagnosis not present

## 2019-04-20 DIAGNOSIS — Z1283 Encounter for screening for malignant neoplasm of skin: Secondary | ICD-10-CM | POA: Diagnosis not present

## 2019-04-20 DIAGNOSIS — D485 Neoplasm of uncertain behavior of skin: Secondary | ICD-10-CM | POA: Diagnosis not present

## 2019-04-20 DIAGNOSIS — D225 Melanocytic nevi of trunk: Secondary | ICD-10-CM | POA: Diagnosis not present

## 2019-05-04 DIAGNOSIS — E785 Hyperlipidemia, unspecified: Secondary | ICD-10-CM | POA: Diagnosis not present

## 2019-05-04 DIAGNOSIS — E1169 Type 2 diabetes mellitus with other specified complication: Secondary | ICD-10-CM | POA: Diagnosis not present

## 2019-05-04 DIAGNOSIS — K2270 Barrett's esophagus without dysplasia: Secondary | ICD-10-CM | POA: Diagnosis not present

## 2019-05-04 DIAGNOSIS — F319 Bipolar disorder, unspecified: Secondary | ICD-10-CM | POA: Diagnosis not present

## 2019-05-08 DIAGNOSIS — Z79899 Other long term (current) drug therapy: Secondary | ICD-10-CM | POA: Diagnosis not present

## 2019-05-08 DIAGNOSIS — E785 Hyperlipidemia, unspecified: Secondary | ICD-10-CM | POA: Diagnosis not present

## 2019-05-08 DIAGNOSIS — E1139 Type 2 diabetes mellitus with other diabetic ophthalmic complication: Secondary | ICD-10-CM | POA: Diagnosis not present

## 2019-05-09 ENCOUNTER — Encounter (INDEPENDENT_AMBULATORY_CARE_PROVIDER_SITE_OTHER): Payer: Medicare PPO | Admitting: Ophthalmology

## 2019-05-09 ENCOUNTER — Other Ambulatory Visit: Payer: Self-pay

## 2019-05-09 DIAGNOSIS — D3132 Benign neoplasm of left choroid: Secondary | ICD-10-CM | POA: Diagnosis not present

## 2019-05-09 DIAGNOSIS — H353231 Exudative age-related macular degeneration, bilateral, with active choroidal neovascularization: Secondary | ICD-10-CM

## 2019-05-09 DIAGNOSIS — H43813 Vitreous degeneration, bilateral: Secondary | ICD-10-CM

## 2019-05-09 DIAGNOSIS — I10 Essential (primary) hypertension: Secondary | ICD-10-CM | POA: Diagnosis not present

## 2019-05-09 DIAGNOSIS — H35033 Hypertensive retinopathy, bilateral: Secondary | ICD-10-CM

## 2019-05-10 ENCOUNTER — Telehealth: Payer: Self-pay | Admitting: Neurology

## 2019-05-10 MED ORDER — ARIPIPRAZOLE 5 MG PO TABS: 5.0000 mg | ORAL_TABLET | Freq: Every day | ORAL | 1 refills | Status: DC

## 2019-05-10 NOTE — Telephone Encounter (Signed)
Pt's mother Adrienne [name] on [place] called stating she is needing to speak to Dr. Jannifer Franklin. Mother states pt sits in bed all day and all she does is eat and smoke. She never checks her mail and they have cut off her gas. Mother states pt says she does not want to come to her f/u appt. Mother would like to be advised.

## 2019-05-10 NOTE — Telephone Encounter (Signed)
I called the patient, talk with the mother.  The patient has a history of bipolar disorder, she is followed by Dr. Jimmye Norman from psychiatry.  She is on Effexor for depression.  She apparently has become withdrawn, she does not go anywhere or do anything, she sits on the bed eats and smokes, she eats the same thing every day with cottage cheese with mandrin oranges.  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  Her mother has had to take over managing her affairs.  MRI of the brain done recently was normal.  The patient appears to be severely depressed with significant anhedonia and withdrawal.  The patient is on Effexor, I will add Abilify to this, the patient will follow up in April, the mother asked to get a afternoon appointment as she cannot get her out of bed in the morning early enough to get to the doctor.

## 2019-05-11 NOTE — Telephone Encounter (Signed)
I contacted the pt and lvm advising we have moved her appt to 06/08/2019 at 230. Pt was advised to call back if this appt date/time did not work well for her.

## 2019-05-15 ENCOUNTER — Other Ambulatory Visit (HOSPITAL_COMMUNITY): Payer: Self-pay | Admitting: Internal Medicine

## 2019-05-15 DIAGNOSIS — Z1231 Encounter for screening mammogram for malignant neoplasm of breast: Secondary | ICD-10-CM

## 2019-05-25 ENCOUNTER — Other Ambulatory Visit: Payer: Self-pay

## 2019-05-25 ENCOUNTER — Ambulatory Visit (HOSPITAL_COMMUNITY)
Admission: RE | Admit: 2019-05-25 | Discharge: 2019-05-25 | Disposition: A | Discharge status: Home / Self Care | Payer: Medicare PPO | Source: Ambulatory Visit | Attending: Internal Medicine | Admitting: Internal Medicine

## 2019-05-25 DIAGNOSIS — Z1231 Encounter for screening mammogram for malignant neoplasm of breast: Secondary | ICD-10-CM | POA: Insufficient documentation

## 2019-06-08 ENCOUNTER — Other Ambulatory Visit: Payer: Self-pay

## 2019-06-08 ENCOUNTER — Ambulatory Visit: Payer: Medicare Other | Admitting: Neurology

## 2019-06-08 ENCOUNTER — Ambulatory Visit: Payer: Medicare PPO | Admitting: Neurology

## 2019-06-08 ENCOUNTER — Encounter: Payer: Self-pay | Admitting: Neurology

## 2019-06-08 VITALS — BP 138/85 | HR 80 | Temp 97.0°F | Wt 183.0 lb

## 2019-06-08 DIAGNOSIS — R413 Other amnesia: Secondary | ICD-10-CM | POA: Diagnosis not present

## 2019-06-08 DIAGNOSIS — H3530 Unspecified macular degeneration: Secondary | ICD-10-CM | POA: Diagnosis not present

## 2019-06-08 DIAGNOSIS — R441 Visual hallucinations: Secondary | ICD-10-CM | POA: Diagnosis not present

## 2019-06-08 DIAGNOSIS — G43019 Migraine without aura, intractable, without status migrainosus: Secondary | ICD-10-CM

## 2019-06-08 HISTORY — DX: Migraine without aura, intractable, without status migrainosus: G43.019

## 2019-06-08 HISTORY — DX: Other amnesia: R41.3

## 2019-06-08 MED ORDER — DIVALPROEX SODIUM 250 MG PO DR TAB: DELAYED_RELEASE_TABLET | ORAL | 3 refills | Status: DC

## 2019-06-08 NOTE — Progress Notes (Signed)
Reason for visit: Visual hallucinations, memory disturbance, depression  Adrienne [name] is an 64 y.o. female  History of present illness:  Adrienne [name] is a 64 year old right-handed white female with a history of macular degeneration associated with significant visual impairment.  The patient has bipolar disorder, she is followed through psychiatry for this.  She is on Effexor for depression, but she has had significant problems with anhedonia, she spends most of her time in bed, she does not want to go outside or do anything.  She is a Ship broker, she has a very limited diet of cottage cheese and mandrin oranges that she eats every day.  The patient continues to have some occasional visual hallucinations, the patient claims that is occurring once or twice a month, but the family believes it is more frequent.  She usually will see people or animals outside the window.  She is also complaining of headaches, she has a lifelong history of migraine, her headaches are occurring about 3 times a week.  The headaches may last up to 2 days at times.  If she takes extra strength Tylenol for the headache.  She is having some gait instability, she is on clonazepam taking 4 mg in the evening and 2 mg during the day.  The patient requires assistance keeping up with medications, appointments, and doing her finances.  The patient has some issues with memory document on the last examination.  MRI of the brain was unremarkable.  She was placed on Abilify for concerns with depression, but she still has significant issues with this.  She is not on lithium currently.  Past Medical History:  Diagnosis Date  . Allergic rhinitis   . Allergy   . Anemia    leukocytosis/thrombocytosis--Adrienne [name]  . Anxiety   . Arthritis   . Asthma   . 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 Closed head injury 2005  . Common migraine with intractable migraine 06/08/2019  . Complication of anesthesia    "my blood pressure dropped"  . COPD  (chronic obstructive pulmonary disease) ([place])   . Depression    ? Bipolar Disorder--Adrienne [name]  . Emphysema of lung ([place])   . GERD (gastroesophageal reflux disease)    Barrett's esophagus  . Headache    migraines  . Hx of migraines   . Hypertension   . Low back pain   . Macular degeneration, bilateral    Dry-Adrienne [name]  . Memory difficulty 06/08/2019  . Neuromuscular disorder ([place])   . NIDDM (non-insulin dependent diabetes mellitus)   . OSA (obstructive sleep apnea)    cannot tolerate mask  . RLS (restless legs syndrome)   . Seizure disorder ([place])    ? absence; due to head truama  . Seizures ([place])   . Sleep apnea   . Status post right hip replacement   . Status post rotator cuff surgery   . Visual hallucinations 01/17/2019    Past Surgical History:  Procedure Laterality Date  . ABDOMINAL HYSTERECTOMY     heavy bleeding  . APPENDECTOMY    . BIOPSY  12/27/2014   Procedure: Esophageal BIOPSY;  Surgeon: Daneil Dolin, MD;  [facility];  Service: Endoscopy;;  . BIOPSY  02/17/2018   Procedure: BIOPSY;  Surgeon: Daneil Dolin, MD;  [facility];  Service: Endoscopy;;  (gastric)  . COLONOSCOPY WITH ESOPHAGOGASTRODUODENOSCOPY (EGD)  12/08   TCS/EGD: + Barrett's,+ gastritis, friable anal canal, hyperplastic polyp  . COLONOSCOPY WITH  PROPOFOL N/A 12/27/2014   RMR: Internal hemorrhoids likely source of hematochezia. Redundant colon.   . ESOPHAGOGASTRODUODENOSCOPY  11/16/2002   Adrienne [name]- salmon-colored epithelium in the upper esophageal sphincter previous bx= ectopic gastric mucosa, salmon colored epithelium at the distal esophagus proviously bx= barretts esophagus o/w the esophageal mucosa appeared normal. patulous esophagogastric junction, small hiatal hernia, gastric mucosa o/w appeared normal. normal D1 and D2  . ESOPHAGOGASTRODUODENOSCOPY (EGD) WITH PROPOFOL N/A 12/27/2014   RMR: Abnoraml distal esophagus consistiant with prior diagnosis fo Barretts esophagus-  Status post biopsy. Hiatal hernia. Antral erosions status post gastric biopsy,  . ESOPHAGOGASTRODUODENOSCOPY (EGD) WITH PROPOFOL N/A 02/17/2018   Procedure: ESOPHAGOGASTRODUODENOSCOPY (EGD) WITH PROPOFOL;  Surgeon: Daneil Dolin, MD;  [facility];  Service: Endoscopy;  Laterality: N/A;  1:30pm  . FRACTURE SURGERY Left 03/07/2013   knee  . fx left knee Left 2014  . INCISION AND DRAINAGE ABSCESS Right 09/03/2018   Procedure: INCISION AND DRAINAGE RIGHT NECK ABSCESS;  Surgeon: Melida Quitter, MD;  Location: [place];  Service: ENT;  Laterality: Right;  . ORIF TIBIA PLATEAU Left 03/07/2013   Procedure: OPEN REDUCTION INTERNAL FIXATION (ORIF) TIBIAL PLATEAU;  Surgeon: Renette Butters, MD;  Location: [facility];  Service: Orthopedics;  Laterality: Left;  . REVISION TOTAL HIP ARTHROPLASTY     Right  . ROTATOR CUFF REPAIR     Bilaterally  . SHOULDER INJECTION Left 03/07/2013   Procedure: SHOULDER INJECTION ; Depomedrol + 0.25% Marcaine mixture 4:1;  Surgeon: Renette Butters, MD;  Location: [facility];  Service: Orthopedics;  Laterality: Left;  . TONSILLECTOMY    . TOTAL HIP ARTHROPLASTY Left 06/01/2016   Procedure: LEFT TOTAL HIP ARTHROPLASTY ANTERIOR APPROACH;  Surgeon: Gaynelle Arabian, MD;  [facility];  Service: Orthopedics;  Laterality: Left;  [name] VESICOVAGINAL FISTULA CLOSURE W/ TAH      Family History  Problem Relation Age of Onset  . Heart disease Father 59  . Atrial fibrillation Mother   . Cancer Mother        carcinoid  . Diabetes Maternal Grandfather   . Cancer Maternal Grandfather   . Heart disease Maternal Grandmother   . Thyroid disease Son     Social history:  reports that she has been smoking cigarettes. She started smoking about 48 years ago. She has a 20.00 pack-year smoking history. She has never used smokeless tobacco. She reports that she does not drink alcohol or use drugs.    Allergies  Allergen Reactions  . Codeine Other (See Comments)    REACTION: headaches    . Dilaudid [Hydromorphone Hcl] Itching  . Morphine And Related Itching and Other (See Comments)    Side effect  . Sulfonamide Derivatives Rash    Medications:  Prior to Admission medications   Medication Sig Start Date End Date Taking? Authorizing Provider  acetaminophen (TYLENOL) 500 MG tablet Take 1,000 mg by mouth every 6 (six) hours as needed.   Yes [provider]  ALPRAZolam Duanne Moron) 1 MG tablet Take 1-2 tablets (1-2 mg total) by mouth See admin instructions. Take 1 mg by mouth in the morning, take 1 mg by mouth at 1200, take 1 mg by mouth at 1600 and take 2 mg by mouth at bedtime 09/09/18  Yes Alekh, Kshitiz, MD  ARIPiprazole (ABILIFY) 5 MG tablet Take 1 tablet (5 mg total) by mouth daily. 05/10/19  Yes Kathrynn Ducking, MD  aspirin 81 MG tablet Take 81 mg by mouth daily.   Yes [provider]  B Complex-C (SUPER B COMPLEX PO) Take 1 tablet by mouth daily.   Yes [provider]  Biotin 10 MG CAPS Take 10 mg by mouth daily.    Yes [provider]  Cholecalciferol (D3-1000) 25 MCG (1000 UT) capsule Take 1,000 Units by mouth daily.    Yes [provider]  CINNAMON PO Take 1,000 mg by mouth 2 (two) times daily.    Yes [provider]  clindamycin (CLEOCIN) 300 MG capsule Take 300 mg by mouth 2 (two) times daily. 01/16/19  Yes [provider]  cyclobenzaprine (FLEXERIL) 10 MG tablet Take 30 mg by mouth at bedtime.    Yes [provider]  doxycycline (VIBRAMYCIN) 100 MG capsule Take 1 capsule (100 mg total) by mouth 2 (two) times daily. 01/22/19  Yes Rolland Porter, MD  Flaxseed, Linseed, (FLAX SEED OIL PO) Take 1,200 mg by mouth daily.   Yes [provider]  glimepiride (AMARYL) 1 MG tablet Take 1 tablet by mouth daily. 08/15/18  Yes [provider]  ibuprofen (ADVIL) 800 MG tablet Take 1 tablet by mouth 4 (four) times daily as needed. 08/24/18  Yes [provider]  lithium carbonate (LITHOBID) 300 MG CR  tablet  09/14/18  Yes [provider]  metFORMIN (GLUCOPHAGE) 1000 MG tablet Take 1 tablet by mouth 2 (two) times a day. 06/09/18  Yes [provider]  methylphenidate (RITALIN) 20 MG tablet Take 20 mg by mouth 3 (three) times daily with meals.    Yes [provider]  Multiple Vitamins-Minerals (OCUVITE ADULT 50+ PO) Take 1 capsule by mouth 2 (two) times daily.    Yes [provider]  naproxen sodium (ALEVE) 220 MG tablet Take 440 mg by mouth 2 (two) times daily as needed (for pain or headache).    Yes [provider]  omeprazole (PRILOSEC) 20 MG capsule TAKE (1) CAPSULE BY MOUTH TWICE DAILY. 04/05/19  Yes Carlis Stable, NP  ondansetron (ZOFRAN) 4 MG tablet Take 1 tablet (4 mg total) by mouth every 8 (eight) hours as needed for nausea or vomiting. 01/22/19  Yes Rolland Porter, MD  prazosin (MINIPRESS) 1 MG capsule Take 1 mg by mouth at bedtime.   Yes [provider]  prazosin (MINIPRESS) 1 MG capsule  08/12/18  Yes [provider]  rosuvastatin (CRESTOR) 20 MG tablet TAKE (1) TABLET BY MOUTH ONCE DAILY. Patient taking differently: Take 20 mg by mouth daily.  02/11/17  Yes Raylene Everts, MD  venlafaxine XR (EFFEXOR-XR) 150 MG 24 hr capsule Take 150 mg by mouth daily with breakfast.   Yes [provider]  vitamin E 400 UNIT capsule Take 400 Units by mouth. Takes occassionally   Yes [provider]  clonazePAM (KLONOPIN) 2 MG tablet TAKE 1/2 TABLET BY MOUTHE3 TIMES DAILY AND 1 AT BEDTIME. 05/30/19   [provider]  HYDROcodone-acetaminophen (NORCO/VICODIN) 5-325 MG tablet Take 1 tablet by mouth every 6 (six) hours as needed. 03/30/19   [provider]  Melatonin 10 MG TABS Take by mouth.    [provider]    ROS:  Out of a complete 14 system review of symptoms, the patient complains only of the following symptoms, and all other reviewed systems are negative.  Hallucinations Depression,  anhedonia Memory disturbance Walking difficulty  Blood pressure 138/85, pulse 80, temperature (!) 97 F (36.1 C), weight 183 lb (83 kg).  Physical Exam  General: The patient is alert and cooperative at the time of the  examination.  The patient is moderately to markedly obese.  Skin: No significant peripheral edema is noted.   Neurologic Exam  Mental status: The patient is alert and oriented x 3 at the time of the examination. The patient has apparent normal recent and remote memory, with an apparently normal attention span and concentration ability.   Cranial nerves: Facial symmetry is present. Speech is normal, no aphasia or dysarthria is noted. Extraocular movements are full. Visual fields are full.  Motor: The patient has good strength in all 4 extremities.  Sensory examination: Soft touch sensation is symmetric on the face, arms, and legs.  Coordination: The patient has good finger-nose-finger and heel-to-shin bilaterally.  Gait and station: The patient has a slightly wide-based gait, patient can walk independently.  Romberg is negative but is slightly unsteady.  Reflexes: Deep tendon reflexes are symmetric.   MRI brain 03/30/19:  IMPRESSION:   Normal MRI brain (with and without).   * MRI scan images were reviewed online. I agree with the written report.    Assessment/Plan:  1.  Memory disturbance  2.  Macular degeneration, significant visual disturbance  3.  Visual hallucinations  4.  Depression, anhedonia  5.  Intractable migraine  The patient is clearly depressed.  She is withdrawn, she spends most of her time in bed.  The family is quite concerned about her.  The combination of Abilify and Effexor have not been effective.  She is now having problems with frequent migraine headache, we will add Depakote for this and for the mood disorder.  She will follow-up here in 4 or 5 months.  She he will be contacting her psychiatrist in the near future.  The  patient is reporting some gait instability, she is on 6 mg daily of clonazepam which could be contributing factor.  Jill Alexanders MD 06/08/2019 3:20 PM  [facility] [address] [place], [place] [address]  Phone [phone] Fax [phone]

## 2019-06-08 NOTE — Patient Instructions (Signed)
We will start Depakote 250 mg twice a day for 2 weeks, then take 2 tablets twice a day.  Depakote (valproic acid) is a seizure medication that also has an FDA approval for migraine headache. The most common potential side effects of this medication include weight gain, tremor, or possible stomach upset. This medication can potentially cause liver problems. If confusion is noted on this medication, contact our office immediately.

## 2019-06-13 ENCOUNTER — Encounter (INDEPENDENT_AMBULATORY_CARE_PROVIDER_SITE_OTHER): Payer: Medicare PPO | Admitting: Ophthalmology

## 2019-06-20 ENCOUNTER — Encounter (INDEPENDENT_AMBULATORY_CARE_PROVIDER_SITE_OTHER): Payer: Medicare PPO | Admitting: Ophthalmology

## 2019-06-20 DIAGNOSIS — H35033 Hypertensive retinopathy, bilateral: Secondary | ICD-10-CM | POA: Diagnosis not present

## 2019-06-20 DIAGNOSIS — H43813 Vitreous degeneration, bilateral: Secondary | ICD-10-CM | POA: Diagnosis not present

## 2019-06-20 DIAGNOSIS — D3132 Benign neoplasm of left choroid: Secondary | ICD-10-CM

## 2019-06-20 DIAGNOSIS — H353231 Exudative age-related macular degeneration, bilateral, with active choroidal neovascularization: Secondary | ICD-10-CM

## 2019-06-20 DIAGNOSIS — I10 Essential (primary) hypertension: Secondary | ICD-10-CM

## 2019-06-27 DIAGNOSIS — F419 Anxiety disorder, unspecified: Secondary | ICD-10-CM | POA: Diagnosis not present

## 2019-06-27 DIAGNOSIS — F3481 Disruptive mood dysregulation disorder: Secondary | ICD-10-CM | POA: Diagnosis not present

## 2019-07-26 ENCOUNTER — Other Ambulatory Visit: Payer: Self-pay

## 2019-07-26 ENCOUNTER — Encounter (INDEPENDENT_AMBULATORY_CARE_PROVIDER_SITE_OTHER): Payer: Medicare PPO | Admitting: Ophthalmology

## 2019-07-26 DIAGNOSIS — I10 Essential (primary) hypertension: Secondary | ICD-10-CM

## 2019-07-26 DIAGNOSIS — H43813 Vitreous degeneration, bilateral: Secondary | ICD-10-CM

## 2019-07-26 DIAGNOSIS — H353231 Exudative age-related macular degeneration, bilateral, with active choroidal neovascularization: Secondary | ICD-10-CM

## 2019-07-26 DIAGNOSIS — H35033 Hypertensive retinopathy, bilateral: Secondary | ICD-10-CM | POA: Diagnosis not present

## 2019-07-31 ENCOUNTER — Other Ambulatory Visit: Payer: Self-pay | Admitting: Neurology

## 2019-08-03 DIAGNOSIS — E118 Type 2 diabetes mellitus with unspecified complications: Secondary | ICD-10-CM | POA: Diagnosis not present

## 2019-08-30 ENCOUNTER — Other Ambulatory Visit: Payer: Self-pay

## 2019-08-30 ENCOUNTER — Encounter (INDEPENDENT_AMBULATORY_CARE_PROVIDER_SITE_OTHER): Payer: Medicare Other | Admitting: Ophthalmology

## 2019-08-30 DIAGNOSIS — H353231 Exudative age-related macular degeneration, bilateral, with active choroidal neovascularization: Secondary | ICD-10-CM | POA: Diagnosis not present

## 2019-08-30 DIAGNOSIS — D3132 Benign neoplasm of left choroid: Secondary | ICD-10-CM | POA: Diagnosis not present

## 2019-08-30 DIAGNOSIS — I10 Essential (primary) hypertension: Secondary | ICD-10-CM

## 2019-08-30 DIAGNOSIS — H43813 Vitreous degeneration, bilateral: Secondary | ICD-10-CM

## 2019-08-30 DIAGNOSIS — H35033 Hypertensive retinopathy, bilateral: Secondary | ICD-10-CM

## 2019-09-25 ENCOUNTER — Encounter (INDEPENDENT_AMBULATORY_CARE_PROVIDER_SITE_OTHER): Payer: Medicare Other | Admitting: Ophthalmology

## 2019-09-25 ENCOUNTER — Other Ambulatory Visit: Payer: Self-pay

## 2019-09-25 DIAGNOSIS — I10 Essential (primary) hypertension: Secondary | ICD-10-CM

## 2019-09-25 DIAGNOSIS — H353231 Exudative age-related macular degeneration, bilateral, with active choroidal neovascularization: Secondary | ICD-10-CM

## 2019-09-25 DIAGNOSIS — H43813 Vitreous degeneration, bilateral: Secondary | ICD-10-CM

## 2019-09-25 DIAGNOSIS — D3132 Benign neoplasm of left choroid: Secondary | ICD-10-CM

## 2019-09-25 DIAGNOSIS — H35033 Hypertensive retinopathy, bilateral: Secondary | ICD-10-CM

## 2019-10-12 ENCOUNTER — Encounter: Payer: Self-pay | Admitting: Neurology

## 2019-10-12 ENCOUNTER — Ambulatory Visit (INDEPENDENT_AMBULATORY_CARE_PROVIDER_SITE_OTHER): Payer: Medicare Other | Admitting: Neurology

## 2019-10-12 VITALS — BP 149/89 | HR 68 | Ht 63.0 in | Wt 180.0 lb

## 2019-10-12 DIAGNOSIS — R413 Other amnesia: Secondary | ICD-10-CM | POA: Diagnosis not present

## 2019-10-12 DIAGNOSIS — R441 Visual hallucinations: Secondary | ICD-10-CM | POA: Diagnosis not present

## 2019-10-12 DIAGNOSIS — Z5181 Encounter for therapeutic drug level monitoring: Secondary | ICD-10-CM

## 2019-10-12 DIAGNOSIS — F329 Major depressive disorder, single episode, unspecified: Secondary | ICD-10-CM

## 2019-10-12 DIAGNOSIS — F419 Anxiety disorder, unspecified: Secondary | ICD-10-CM | POA: Diagnosis not present

## 2019-10-12 DIAGNOSIS — G43019 Migraine without aura, intractable, without status migrainosus: Secondary | ICD-10-CM | POA: Diagnosis not present

## 2019-10-12 DIAGNOSIS — F32A Depression, unspecified: Secondary | ICD-10-CM

## 2019-10-12 MED ORDER — DIVALPROEX SODIUM 500 MG PO DR TAB: DELAYED_RELEASE_TABLET | ORAL | 5 refills | Status: DC

## 2019-10-12 NOTE — Progress Notes (Signed)
Reason for visit: Headache, depression, visual hallucinations  Adrienne [name] is an 64 y.o. female  History of present illness:  Adrienne [name] is a 64 year old right-handed white female with a history of bipolar disorder, depression, and history of macular degeneration.  The patient has had significant issues with depression, she has been withdrawn in the house, she does not initiate any activities, her mother has a power of attorney and pays her bills and makes all of her telephone calls, she will not do any of this.  She does not operate a motor vehicle.  She has had hoarding behavior.  She has been on Effexor and Abilify, but she stopped the Abilify when the prescription ran out, this never helped her depression.  She was told to contact her psychiatry physician but never did.  The patient has some decreased visual acuity with the macular degeneration, she still has occasional visual hallucinations but this is not a big issue for her.  She was placed on Depakote for her bipolar disorder and for her migraine headaches, but she still has 3-4 headaches a week.  She is on 500 mg of Depakote twice daily.  She comes in with her sister, they are somewhat argumentative together.  Past Medical History:  Diagnosis Date  . Allergic rhinitis   . Allergy   . Anemia    leukocytosis/thrombocytosis--Dr. Tressie Stalker  . Anxiety   . Arthritis   . Asthma   . 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 Closed head injury 2005  . Common migraine with intractable migraine 06/08/2019  . Complication of anesthesia    "my blood pressure dropped"  . COPD (chronic obstructive pulmonary disease) ([place])   . Depression    ? Bipolar Disorder--Dr, Jimmye Norman  . Emphysema of lung ([place])   . GERD (gastroesophageal reflux disease)    Barrett's esophagus  . Headache    migraines  . Hx of migraines   . Hypertension   . Low back pain   . Macular degeneration, bilateral    Dry-Dr. Iona Hansen  . Memory difficulty 06/08/2019  .  Neuromuscular disorder ([place])   . NIDDM (non-insulin dependent diabetes mellitus)   . OSA (obstructive sleep apnea)    cannot tolerate mask  . RLS (restless legs syndrome)   . Seizure disorder ([place])    ? absence; due to head truama  . Seizures ([place])   . Sleep apnea   . Status post right hip replacement   . Status post rotator cuff surgery   . Visual hallucinations 01/17/2019    Past Surgical History:  Procedure Laterality Date  . ABDOMINAL HYSTERECTOMY     heavy bleeding  . APPENDECTOMY    . BIOPSY  12/27/2014   Procedure: Esophageal BIOPSY;  Surgeon: Daneil Dolin, MD;  [facility];  Service: Endoscopy;;  . BIOPSY  02/17/2018   Procedure: BIOPSY;  Surgeon: Daneil Dolin, MD;  [facility];  Service: Endoscopy;;  (gastric)  . COLONOSCOPY WITH ESOPHAGOGASTRODUODENOSCOPY (EGD)  12/08   TCS/EGD: + Barrett's,+ gastritis, friable anal canal, hyperplastic polyp  . COLONOSCOPY WITH PROPOFOL N/A 12/27/2014   RMR: Internal hemorrhoids likely source of hematochezia. Redundant colon.   . ESOPHAGOGASTRODUODENOSCOPY  11/16/2002   Dr.Rourk- salmon-colored epithelium in the upper esophageal sphincter previous bx= ectopic gastric mucosa, salmon colored epithelium at the distal esophagus proviously bx= barretts esophagus o/w the esophageal mucosa appeared normal. patulous esophagogastric junction, small hiatal hernia, gastric mucosa o/w appeared normal. normal D1 and  D2  . ESOPHAGOGASTRODUODENOSCOPY (EGD) WITH PROPOFOL N/A 12/27/2014   RMR: Abnoraml distal esophagus consistiant with prior diagnosis fo Barretts esophagus- Status post biopsy. Hiatal hernia. Antral erosions status post gastric biopsy,  . ESOPHAGOGASTRODUODENOSCOPY (EGD) WITH PROPOFOL N/A 02/17/2018   Procedure: ESOPHAGOGASTRODUODENOSCOPY (EGD) WITH PROPOFOL;  Surgeon: Daneil Dolin, MD;  [facility];  Service: Endoscopy;  Laterality: N/A;  1:30pm  . FRACTURE SURGERY Left 03/07/2013   knee  . fx left knee  Left 2014  . INCISION AND DRAINAGE ABSCESS Right 09/03/2018   Procedure: INCISION AND DRAINAGE RIGHT NECK ABSCESS;  Surgeon: Melida Quitter, MD;  Location: [place];  Service: ENT;  Laterality: Right;  . ORIF TIBIA PLATEAU Left 03/07/2013   Procedure: OPEN REDUCTION INTERNAL FIXATION (ORIF) TIBIAL PLATEAU;  Surgeon: Renette Butters, MD;  Location: [place] [facility];  Service: Orthopedics;  Laterality: Left;  . REVISION TOTAL HIP ARTHROPLASTY     Right  . ROTATOR CUFF REPAIR     Bilaterally  . SHOULDER INJECTION Left 03/07/2013   Procedure: SHOULDER INJECTION ; Depomedrol + 0.25% Marcaine mixture 4:1;  Surgeon: Renette Butters, MD;  Location: [place];  Service: Orthopedics;  Laterality: Left;  . TONSILLECTOMY    . TOTAL HIP ARTHROPLASTY Left 06/01/2016   Procedure: LEFT TOTAL HIP ARTHROPLASTY ANTERIOR APPROACH;  Surgeon: Gaynelle Arabian, MD;  [facility];  Service: Orthopedics;  Laterality: Left;  [name] VESICOVAGINAL FISTULA CLOSURE W/ TAH      Family History  Problem Relation Age of Onset  . Heart disease Father 61  . Atrial fibrillation Mother   . Cancer Mother        carcinoid  . Diabetes Maternal Grandfather   . Cancer Maternal Grandfather   . Heart disease Maternal Grandmother   . Thyroid disease Son     Social history:  reports that she has been smoking cigarettes. She started smoking about 48 years ago. She has a 20.00 pack-year smoking history. She has never used smokeless tobacco. She reports that she does not drink alcohol and does not use drugs.    Allergies  Allergen Reactions  . Codeine Other (See Comments)    REACTION: headaches  . Dilaudid [Hydromorphone Hcl] Itching  . Morphine And Related Itching and Other (See Comments)    Side effect  . Sulfonamide Derivatives Rash    Medications:  Prior to Admission medications   Medication Sig Start Date End Date Taking? Authorizing Provider  acetaminophen (TYLENOL) 500 MG tablet Take 1,000 mg by mouth every 6 (six) hours as needed.    Yes [provider]  ALPRAZolam Duanne Moron) 1 MG tablet Take 1-2 tablets (1-2 mg total) by mouth See admin instructions. Take 1 mg by mouth in the morning, take 1 mg by mouth at 1200, take 1 mg by mouth at 1600 and take 2 mg by mouth at bedtime 09/09/18  Yes Aline August, MD  Ascorbic Acid (VITAMIN C) 100 MG tablet Take 100 mg by mouth daily.   Yes [provider]  aspirin 81 MG tablet Take 81 mg by mouth daily.   Yes [provider]  Cholecalciferol (D3-1000) 25 MCG (1000 UT) capsule Take 1,000 Units by mouth daily.    Yes [provider]  CLINDAMYCIN HCL PO Take by mouth as needed. Only went she gets dental work done   Psychologist, prison and probation services, Historical, MD  clonazePAM (KLONOPIN) 2 MG tablet TAKE 1/2 TABLET BY MOUTHE3 TIMES DAILY AND 1 AT BEDTIME. 05/30/19  Yes [provider]  cyclobenzaprine (FLEXERIL) 10 MG tablet Take 30 mg by mouth at bedtime.    Yes [provider]  divalproex (DEPAKOTE) 250 MG DR tablet One tablet twice a day for 2 weeks, then take 2 tablets twice a day 06/08/19  Yes Kathrynn Ducking, MD  glimepiride (AMARYL) 1 MG tablet Take 1 tablet by mouth daily. 08/15/18  Yes [provider]  Melatonin 10 MG TABS Take by mouth.   Yes [provider]  metFORMIN (GLUCOPHAGE) 1000 MG tablet Take 1 tablet by mouth 2 (two) times a day. 06/09/18  Yes [provider]  methylphenidate (RITALIN) 20 MG tablet Take 20 mg by mouth 3 (three) times daily with meals.    Yes [provider]  naproxen sodium (ALEVE) 220 MG tablet Take 440 mg by mouth 2 (two) times daily as needed (for pain or headache).    Yes [provider]  Omega-3 Fatty Acids (FISH OIL PO) Take by mouth.   Yes [provider]  omeprazole (PRILOSEC) 20 MG capsule TAKE (1) CAPSULE BY MOUTH TWICE DAILY. 04/05/19  Yes Carlis Stable, NP  prazosin (MINIPRESS) 1 MG capsule Take 1 mg by mouth at bedtime.   Yes [provider]  rosuvastatin  (CRESTOR) 20 MG tablet TAKE (1) TABLET BY MOUTH ONCE DAILY. Patient taking differently: Take 20 mg by mouth daily.  02/11/17  Yes Raylene Everts, MD  traZODone (DESYREL) 50 MG tablet Take 50 mg by mouth at bedtime. 09/30/19  Yes [provider]  venlafaxine XR (EFFEXOR-XR) 150 MG 24 hr capsule Take 150 mg by mouth daily with breakfast.   Yes [provider]  vitamin E 400 UNIT capsule Take 400 Units by mouth. Takes occassionally   Yes [provider]  ARIPiprazole (ABILIFY) 5 MG tablet Take 1 tablet (5 mg total) by mouth daily. Patient not taking: Reported on 10/12/2019 05/10/19   Kathrynn Ducking, MD  B Complex-C (SUPER B COMPLEX PO) Take 1 tablet by mouth daily. Patient not taking: Reported on 10/12/2019    [provider]  Biotin 10 MG CAPS Take 10 mg by mouth daily.  Patient not taking: Reported on 10/12/2019    [provider]  CINNAMON PO Take 1,000 mg by mouth 2 (two) times daily.  Patient not taking: Reported on 10/12/2019    [provider]  Flaxseed, Linseed, (FLAX SEED OIL PO) Take 1,200 mg by mouth daily. Patient not taking: Reported on 10/12/2019    [provider]  ibuprofen (ADVIL) 800 MG tablet Take 1 tablet by mouth 4 (four) times daily as needed. Patient not taking: Reported on 10/12/2019 08/24/18   [provider]  Multiple Vitamins-Minerals (OCUVITE ADULT 50+ PO) Take 1 capsule by mouth 2 (two) times daily.  Patient not taking: Reported on 10/12/2019    [provider]  ondansetron (ZOFRAN) 4 MG tablet Take 1 tablet (4 mg total) by mouth every 8 (eight) hours as needed for nausea or vomiting. Patient not taking: Reported on 10/12/2019 01/22/19   Rolland Porter, MD    ROS:  Out of a complete 14 system review of symptoms, the patient complains only of the following symptoms, and all other reviewed systems are negative.  Headache Depression Decreased vision  Blood pressure (!) 149/89, pulse 68, height 5\' 3"   (1.6 m), weight 180 lb (81.6 kg).  Physical Exam  General: The patient is alert and cooperative at the time of the examination.  The patient is moderately obese.  Skin:  No significant peripheral edema is noted.   Neurologic Exam  Mental status: The patient is alert and oriented x 3 at the time of the examination. The patient has apparent normal recent and remote memory, with an apparently normal attention span and concentration ability.   Cranial nerves: Facial symmetry is present. Speech is normal, no aphasia or dysarthria is noted. Extraocular movements are full. Visual fields are full.  Motor: The patient has good strength in all 4 extremities.  Sensory examination: Soft touch sensation is symmetric on the face, arms, and legs.  Coordination: The patient has good finger-nose-finger and heel-to-shin bilaterally.  Gait and station: The patient has a normal gait. Tandem gait is slightly unsteady. Romberg is negative. No drift is seen.  Reflexes: Deep tendon reflexes are symmetric.   MRI brain 03/29/19:  IMPRESSION:   Normal MRI brain (with and without).   * MRI scan images were reviewed online. I agree with the written report.   Assessment/Plan:  1.  History of depression  2.  Frequent headache  3.  Visual hallucinations  The patient will be increased on the Depakote taking 500 mg in the morning and 1000 mg in evening.  I have indicated the patient should be contacting her psychiatry physician for adequate treatment of her depression.  There is some prior history of possible seizures, there is no recent history of any seizure type events.  MRI of the brain done recently was normal.  She will follow-up here in 6 months, blood work will be done today.  Jill Alexanders MD 10/12/2019 12:38 PM  [facility] [address] [place], [place] [address]  Phone [phone] Fax [phone]

## 2019-10-13 LAB — CBC WITH DIFFERENTIAL/PLATELET
Basophils Absolute: 0.1 10*3/uL (ref 0.0–0.2)
Basos: 1 %
EOS (ABSOLUTE): 0.2 10*3/uL (ref 0.0–0.4)
Eos: 2 %
Hematocrit: 43.7 % (ref 34.0–46.6)
Hemoglobin: 15 g/dL (ref 11.1–15.9)
Immature Grans (Abs): 0 10*3/uL (ref 0.0–0.1)
Immature Granulocytes: 0 %
Lymphocytes Absolute: 2.7 10*3/uL (ref 0.7–3.1)
Lymphs: 26 %
MCH: 30.9 pg (ref 26.6–33.0)
MCHC: 34.3 g/dL (ref 31.5–35.7)
MCV: 90 fL (ref 79–97)
Monocytes Absolute: 0.9 10*3/uL (ref 0.1–0.9)
Monocytes: 9 %
Neutrophils Absolute: 6.3 10*3/uL (ref 1.4–7.0)
Neutrophils: 62 %
Platelets: 235 10*3/uL (ref 150–450)
RBC: 4.85 x10E6/uL (ref 3.77–5.28)
RDW: 12.6 % (ref 11.7–15.4)
WBC: 10.1 10*3/uL (ref 3.4–10.8)

## 2019-10-13 LAB — COMPREHENSIVE METABOLIC PANEL
ALT: 17 IU/L (ref 0–32)
AST: 17 IU/L (ref 0–40)
Albumin/Globulin Ratio: 1.8 (ref 1.2–2.2)
Albumin: 4.4 g/dL (ref 3.8–4.8)
Alkaline Phosphatase: 54 IU/L (ref 48–121)
BUN/Creatinine Ratio: 29 — ABNORMAL HIGH (ref 12–28)
BUN: 22 mg/dL (ref 8–27)
Bilirubin Total: <0.2 mg/dL (ref 0.0–1.2)
CO2: 26 mmol/L (ref 20–29)
Calcium: 9.8 mg/dL (ref 8.7–10.3)
Chloride: 102 mmol/L (ref 96–106)
Creatinine, Ser: 0.77 mg/dL (ref 0.57–1.00)
GFR calc Af Amer: 94 mL/min/{1.73_m2} (ref >59)
GFR calc non Af Amer: 81 mL/min/{1.73_m2} (ref >59)
Globulin, Total: 2.5 g/dL (ref 1.5–4.5)
Glucose: 78 mg/dL (ref 65–99)
Potassium: 4.5 mmol/L (ref 3.5–5.2)
Sodium: 143 mmol/L (ref 134–144)
Total Protein: 6.9 g/dL (ref 6.0–8.5)

## 2019-10-13 LAB — VALPROIC ACID LEVEL: Valproic Acid Lvl: 29 ug/mL — ABNORMAL LOW (ref 50–100)

## 2019-10-16 ENCOUNTER — Telehealth: Payer: Self-pay | Admitting: *Deleted

## 2019-10-16 NOTE — Telephone Encounter (Signed)
Pt called and LVM today at 12:46pm wanting to the RN to call back when available.

## 2019-10-16 NOTE — Telephone Encounter (Addendum)
LVM requesting call back.

## 2019-10-16 NOTE — Telephone Encounter (Signed)
LVM advising patient the blood work is unremarkable, except her valproic acid level is low. Please  continue with plan to increase dosing to 1500 mg daily, Depakote 500 mg in morning, 1000 mg in evening. I repeated the message and left # for questions.

## 2019-10-17 ENCOUNTER — Telehealth: Payer: Self-pay | Admitting: Neurology

## 2019-10-17 NOTE — Telephone Encounter (Signed)
Called patient on # listed. LVM requesting call back.

## 2019-10-17 NOTE — Telephone Encounter (Signed)
error 

## 2019-10-17 NOTE — Telephone Encounter (Signed)
Pt's sister [name] called, would like a nurse to call back. Clarification why Pt's medication was increased. Would like for you to call Adrienne [name] and speak with Pt.

## 2019-10-17 NOTE — Telephone Encounter (Signed)
Patient returned call wanting to clarify medication instructions. We reviewed new dosages, Patient verbalized understanding, appreciation.

## 2019-10-23 ENCOUNTER — Other Ambulatory Visit: Payer: Self-pay

## 2019-10-23 ENCOUNTER — Encounter (INDEPENDENT_AMBULATORY_CARE_PROVIDER_SITE_OTHER): Payer: Medicare Other | Admitting: Ophthalmology

## 2019-10-23 DIAGNOSIS — H35033 Hypertensive retinopathy, bilateral: Secondary | ICD-10-CM

## 2019-10-23 DIAGNOSIS — D3132 Benign neoplasm of left choroid: Secondary | ICD-10-CM | POA: Diagnosis not present

## 2019-10-23 DIAGNOSIS — H43813 Vitreous degeneration, bilateral: Secondary | ICD-10-CM

## 2019-10-23 DIAGNOSIS — I10 Essential (primary) hypertension: Secondary | ICD-10-CM | POA: Diagnosis not present

## 2019-10-23 DIAGNOSIS — H353231 Exudative age-related macular degeneration, bilateral, with active choroidal neovascularization: Secondary | ICD-10-CM | POA: Diagnosis not present

## 2019-10-30 ENCOUNTER — Ambulatory Visit: Payer: Medicare PPO | Admitting: Urology

## 2019-11-10 ENCOUNTER — Ambulatory Visit: Payer: Medicare PPO | Admitting: Urology

## 2019-11-10 NOTE — Progress Notes (Deleted)
Subjective: 1. Urinary incontinence, unspecified type      Adrienne [name] is a 64 yo female who is sent in consultation by Dr. Willey Blade for urinary incontinence.  ROS:  ROS  Allergies  Allergen Reactions  . Codeine Other (See Comments)    REACTION: headaches  . Dilaudid [Hydromorphone Hcl] Itching  . Morphine And Related Itching and Other (See Comments)    Side effect  . Sulfonamide Derivatives Rash    Past Medical History:  Diagnosis Date  . Allergic rhinitis   . Allergy   . Anemia    leukocytosis/thrombocytosis--Dr. Tressie Stalker  . Anxiety   . Arthritis   . Asthma   . 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 Closed head injury 2005  . Common migraine with intractable migraine 06/08/2019  . Complication of anesthesia    "my blood pressure dropped"  . COPD (chronic obstructive pulmonary disease) ([place])   . Depression    ? Bipolar Disorder--Dr, Jimmye Norman  . Emphysema of lung ([place])   . GERD (gastroesophageal reflux disease)    Barrett's esophagus  . Headache    migraines  . Hx of migraines   . Hypertension   . Low back pain   . Macular degeneration, bilateral    Dry-Dr. Iona Hansen  . Memory difficulty 06/08/2019  . Neuromuscular disorder ([place])   . NIDDM (non-insulin dependent diabetes mellitus)   . OSA (obstructive sleep apnea)    cannot tolerate mask  . RLS (restless legs syndrome)   . Seizure disorder ([place])    ? absence; due to head truama  . Seizures ([place])   . Sleep apnea   . Status post right hip replacement   . Status post rotator cuff surgery   . Visual hallucinations 01/17/2019    Past Surgical History:  Procedure Laterality Date  . ABDOMINAL HYSTERECTOMY     heavy bleeding  . APPENDECTOMY    . BIOPSY  12/27/2014   Procedure: Esophageal BIOPSY;  Surgeon: Daneil Dolin, MD;  [facility];  Service: Endoscopy;;  . BIOPSY  02/17/2018   Procedure: BIOPSY;  Surgeon: Daneil Dolin, MD;  [facility];  Service: Endoscopy;;  (gastric)  . COLONOSCOPY WITH  ESOPHAGOGASTRODUODENOSCOPY (EGD)  12/08   TCS/EGD: + Barrett's,+ gastritis, friable anal canal, hyperplastic polyp  . COLONOSCOPY WITH PROPOFOL N/A 12/27/2014   RMR: Internal hemorrhoids likely source of hematochezia. Redundant colon.   . ESOPHAGOGASTRODUODENOSCOPY  11/16/2002   Dr.Rourk- salmon-colored epithelium in the upper esophageal sphincter previous bx= ectopic gastric mucosa, salmon colored epithelium at the distal esophagus proviously bx= barretts esophagus o/w the esophageal mucosa appeared normal. patulous esophagogastric junction, small hiatal hernia, gastric mucosa o/w appeared normal. normal D1 and D2  . ESOPHAGOGASTRODUODENOSCOPY (EGD) WITH PROPOFOL N/A 12/27/2014   RMR: Abnoraml distal esophagus consistiant with prior diagnosis fo Barretts esophagus- Status post biopsy. Hiatal hernia. Antral erosions status post gastric biopsy,  . ESOPHAGOGASTRODUODENOSCOPY (EGD) WITH PROPOFOL N/A 02/17/2018   Procedure: ESOPHAGOGASTRODUODENOSCOPY (EGD) WITH PROPOFOL;  Surgeon: Daneil Dolin, MD;  [facility];  Service: Endoscopy;  Laterality: N/A;  1:30pm  . FRACTURE SURGERY Left 03/07/2013   knee  . fx left knee Left 2014  . INCISION AND DRAINAGE ABSCESS Right 09/03/2018   Procedure: INCISION AND DRAINAGE RIGHT NECK ABSCESS;  Surgeon: Melida Quitter, MD;  Location: [place];  Service: ENT;  Laterality: Right;  . ORIF TIBIA PLATEAU Left 03/07/2013   Procedure: OPEN REDUCTION INTERNAL FIXATION (ORIF) TIBIAL PLATEAU;  Surgeon: Ernesta Amble  Percell Miller, MD;  Location: [place];  Service: Orthopedics;  Laterality: Left;  . REVISION TOTAL HIP ARTHROPLASTY     Right  . ROTATOR CUFF REPAIR     Bilaterally  . SHOULDER INJECTION Left 03/07/2013   Procedure: SHOULDER INJECTION ; Depomedrol + 0.25% Marcaine mixture 4:1;  Surgeon: Renette Butters, MD;  Location: [place];  Service: Orthopedics;  Laterality: Left;  . TONSILLECTOMY    . TOTAL HIP ARTHROPLASTY Left 06/01/2016   Procedure: LEFT TOTAL HIP  ARTHROPLASTY ANTERIOR APPROACH;  Surgeon: Gaynelle Arabian, MD;  [facility];  Service: Orthopedics;  Laterality: Left;  [name] VESICOVAGINAL FISTULA CLOSURE W/ TAH      Social History   Socioeconomic History  . Marital status: Divorced    Spouse name: Not on file  . Number of children: 3  . Years of education: 66  . Highest education level: Not on file  Occupational History  . Occupation: on disability    Employer: UNEMPLOYED    Comment: due to head injury, 2005  Tobacco Use  . Smoking status: Current Some Day Smoker    Packs/day: 0.50    Years: 40.00    Pack years: 20.00    Types: Cigarettes    Start date: 03/03/1971  . Smokeless tobacco: Never Used  Vaping Use  . Vaping Use: Never used  Substance and Sexual Activity  . Alcohol use: No  . Drug use: No  . Sexual activity: Yes    Birth control/protection: Surgical  Other Topics Concern  . Not on file  Social History Narrative   Right handed   Caffeine use: soda    Lives alone   Two big dogs   Enjoys yard work, home improvement   Social Determinants of Health   Financial Resource Strain:   . Difficulty of Paying Living Expenses: Not on file  Food Insecurity:   . Worried About Charity fundraiser in the Last Year: Not on file  . Ran Out of Food in the Last Year: Not on file  Transportation Needs:   . Lack of Transportation (Medical): Not on file  . Lack of Transportation (Non-Medical): Not on file  Physical Activity:   . Days of Exercise per Week: Not on file  . Minutes of Exercise per Session: Not on file  Stress:   . Feeling of Stress : Not on file  Social Connections:   . Frequency of Communication with Friends and Family: Not on file  . Frequency of Social Gatherings with Friends and Family: Not on file  . Attends Religious Services: Not on file  . Active Member of Clubs or Organizations: Not on file  . Attends Archivist Meetings: Not on file  . Marital Status: Not on file  Intimate Partner  Violence:   . Fear of Current or Ex-Partner: Not on file  . Emotionally Abused: Not on file  . Physically Abused: Not on file  . Sexually Abused: Not on file    Family History  Problem Relation Age of Onset  . Heart disease Father 59  . Atrial fibrillation Mother   . Cancer Mother        carcinoid  . Diabetes Maternal Grandfather   . Cancer Maternal Grandfather   . Heart disease Maternal Grandmother   . Thyroid disease Son     Anti-infectives: Anti-infectives (From admission, onward)   None      Current Outpatient Medications  Medication Sig Dispense Refill  . acetaminophen (TYLENOL) 500 MG tablet  Take 1,000 mg by mouth every 6 (six) hours as needed.    . ALPRAZolam (XANAX) 1 MG tablet Take 1-2 tablets (1-2 mg total) by mouth See admin instructions. Take 1 mg by mouth in the morning, take 1 mg by mouth at 1200, take 1 mg by mouth at 1600 and take 2 mg by mouth at bedtime    . Ascorbic Acid (VITAMIN C) 100 MG tablet Take 100 mg by mouth daily.    [name] aspirin 81 MG tablet Take 81 mg by mouth daily.    . B Complex-C (SUPER B COMPLEX PO) Take 1 tablet by mouth daily. (Patient not taking: Reported on 10/12/2019)    . Biotin 10 MG CAPS Take 10 mg by mouth daily.  (Patient not taking: Reported on 10/12/2019)    . Cholecalciferol (D3-1000) 25 MCG (1000 UT) capsule Take 1,000 Units by mouth daily.     [name] CINNAMON PO Take 1,000 mg by mouth 2 (two) times daily.  (Patient not taking: Reported on 10/12/2019)    . CLINDAMYCIN HCL PO Take by mouth as needed. Only went she gets dental work done    . clonazePAM (KLONOPIN) 2 MG tablet TAKE 1/2 TABLET BY MOUTHE3 TIMES DAILY AND 1 AT BEDTIME.    . cyclobenzaprine (FLEXERIL) 10 MG tablet Take 30 mg by mouth at bedtime.     . divalproex (DEPAKOTE) 500 MG DR tablet One tablet in the morning and 2 in the evening 90 tablet 5  . Flaxseed, Linseed, (FLAX SEED OIL PO) Take 1,200 mg by mouth daily. (Patient not taking: Reported on 10/12/2019)    . glimepiride  (AMARYL) 1 MG tablet Take 1 tablet by mouth daily.    [name] ibuprofen (ADVIL) 800 MG tablet Take 1 tablet by mouth 4 (four) times daily as needed. (Patient not taking: Reported on 10/12/2019)    . Melatonin 10 MG TABS Take by mouth.    . metFORMIN (GLUCOPHAGE) 1000 MG tablet Take 1 tablet by mouth 2 (two) times a day.    . methylphenidate (RITALIN) 20 MG tablet Take 20 mg by mouth 3 (three) times daily with meals.     . Multiple Vitamins-Minerals (OCUVITE ADULT 50+ PO) Take 1 capsule by mouth 2 (two) times daily.  (Patient not taking: Reported on 10/12/2019)    . naproxen sodium (ALEVE) 220 MG tablet Take 440 mg by mouth 2 (two) times daily as needed (for pain or headache).     . Omega-3 Fatty Acids (FISH OIL PO) Take by mouth.    [name] omeprazole (PRILOSEC) 20 MG capsule TAKE (1) CAPSULE BY MOUTH TWICE DAILY. 180 capsule 3  . ondansetron (ZOFRAN) 4 MG tablet Take 1 tablet (4 mg total) by mouth every 8 (eight) hours as needed for nausea or vomiting. (Patient not taking: Reported on 10/12/2019) 10 tablet 0  . prazosin (MINIPRESS) 1 MG capsule Take 1 mg by mouth at bedtime.    . rosuvastatin (CRESTOR) 20 MG tablet TAKE (1) TABLET BY MOUTH ONCE DAILY. (Patient taking differently: Take 20 mg by mouth daily. ) 90 tablet 3  . traZODone (DESYREL) 50 MG tablet Take 50 mg by mouth at bedtime.    [name] venlafaxine XR (EFFEXOR-XR) 150 MG 24 hr capsule Take 150 mg by mouth daily with breakfast.    . vitamin E 400 UNIT capsule Take 400 Units by mouth. Takes occassionally     No current facility-administered medications for this visit.     Objective: Vital signs in last 24 hours:  There were no vitals taken for this visit.  Intake/Output from previous day: No intake/output data recorded. Intake/Output this shift: @IOTHISSHIFT @   Physical Exam  Lab Results:  No results found for this or any previous visit (from the past 24 hour(s)).  BMET No results for input(s): NA, K, CL, CO2, GLUCOSE, BUN, CREATININE, CALCIUM in  the last 72 hours. PT/INR No results for input(s): LABPROT, INR in the last 72 hours. ABG No results for input(s): PHART, HCO3 in the last 72 hours.  Invalid input(s): PCO2, PO2  Studies/Results: No results found.   Assessment/Plan: No problem-specific Assessment & Plan notes found for this encounter.   No orders of the defined types were placed in this encounter.    No orders of the defined types were placed in this encounter.    No follow-ups on file.    CC: ***     Irine Seal 11/10/2019 [phone]

## 2019-11-14 ENCOUNTER — Other Ambulatory Visit: Payer: Self-pay

## 2019-11-14 ENCOUNTER — Ambulatory Visit (INDEPENDENT_AMBULATORY_CARE_PROVIDER_SITE_OTHER): Payer: Medicare Other | Admitting: Urology

## 2019-11-14 ENCOUNTER — Encounter: Payer: Self-pay | Admitting: Urology

## 2019-11-14 VITALS — BP 173/85 | HR 85 | Temp 98.6°F | Ht 63.0 in | Wt 180.0 lb

## 2019-11-14 DIAGNOSIS — R32 Unspecified urinary incontinence: Secondary | ICD-10-CM | POA: Diagnosis not present

## 2019-11-14 LAB — URINALYSIS, ROUTINE W REFLEX MICROSCOPIC
Bilirubin, UA: NEGATIVE
Glucose, UA: NEGATIVE
Leukocytes,UA: NEGATIVE
Nitrite, UA: NEGATIVE
Protein,UA: NEGATIVE
RBC, UA: NEGATIVE
Specific Gravity, UA: 1.015 (ref 1.005–1.030)
Urobilinogen, Ur: 1 mg/dL (ref 0.2–1.0)
pH, UA: 6 (ref 5.0–7.5)

## 2019-11-14 LAB — BLADDER SCAN AMB NON-IMAGING: Scan Result: 23.1

## 2019-11-14 MED ORDER — MIRABEGRON ER 25 MG PO TB24: 25.0000 mg | ORAL_TABLET | Freq: Every day | ORAL | 0 refills | Status: DC

## 2019-11-14 NOTE — Progress Notes (Signed)
11/14/2019 2:27 PM   Adrienne [name] 12-03-1955 [id number]  Referring provider: Asencion Noble, MD [address] [place],  [place] [address]  Urge incontinence  HPI: [name] is [phone] here for evaluation of urge incontinence. Starting 1 year ago she noted worsening urgency and urge incontinence. She uses 2-3 pads per day which are soaked and 1 pad at night which is soaked. She has nocturia 1x. No hx of UTI. No hematuria.She has tried timed voiding which failed to improve her incontinence. No numbness/tingling in her fingers/toes. No prior medical therapy for her incontinence.  She has a hx of a bladder tack.  She has a hx of DMII with an A1c is 6.5 She has mild SUI. She does not have a hx of glaucoma.   PMH: Past Medical History:  Diagnosis Date  . Allergic rhinitis   . Allergy   . Anemia    leukocytosis/thrombocytosis--Dr. Tressie Stalker  . Anxiety   . Arthritis   . Asthma   . 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 Closed head injury 2005  . Common migraine with intractable migraine 06/08/2019  . Complication of anesthesia    "my blood pressure dropped"  . COPD (chronic obstructive pulmonary disease) ([place])   . Depression    ? Bipolar Disorder--Dr, Jimmye Norman  . Emphysema of lung ([place])   . GERD (gastroesophageal reflux disease)    Barrett's esophagus  . Headache    migraines  . Hx of migraines   . Hypertension   . Low back pain   . Macular degeneration, bilateral    Dry-Dr. Iona Hansen  . Memory difficulty 06/08/2019  . Neuromuscular disorder ([place])   . NIDDM (non-insulin dependent diabetes mellitus)   . OSA (obstructive sleep apnea)    cannot tolerate mask  . RLS (restless legs syndrome)   . Seizure disorder ([place])    ? absence; due to head truama  . Seizures ([place])   . Sleep apnea   . Status post right hip replacement   . Status post rotator cuff surgery   . Visual hallucinations 01/17/2019    Surgical History: Past Surgical History:  Procedure Laterality Date  .  ABDOMINAL HYSTERECTOMY     heavy bleeding  . APPENDECTOMY    . BIOPSY  12/27/2014   Procedure: Esophageal BIOPSY;  Surgeon: Daneil Dolin, MD;  [facility];  Service: Endoscopy;;  . BIOPSY  02/17/2018   Procedure: BIOPSY;  Surgeon: Daneil Dolin, MD;  [facility];  Service: Endoscopy;;  (gastric)  . COLONOSCOPY WITH ESOPHAGOGASTRODUODENOSCOPY (EGD)  12/08   TCS/EGD: + Barrett's,+ gastritis, friable anal canal, hyperplastic polyp  . COLONOSCOPY WITH PROPOFOL N/A 12/27/2014   RMR: Internal hemorrhoids likely source of hematochezia. Redundant colon.   . ESOPHAGOGASTRODUODENOSCOPY  11/16/2002   Dr.Rourk- salmon-colored epithelium in the upper esophageal sphincter previous bx= ectopic gastric mucosa, salmon colored epithelium at the distal esophagus proviously bx= barretts esophagus o/w the esophageal mucosa appeared normal. patulous esophagogastric junction, small hiatal hernia, gastric mucosa o/w appeared normal. normal D1 and D2  . ESOPHAGOGASTRODUODENOSCOPY (EGD) WITH PROPOFOL N/A 12/27/2014   RMR: Abnoraml distal esophagus consistiant with prior diagnosis fo Barretts esophagus- Status post biopsy. Hiatal hernia. Antral erosions status post gastric biopsy,  . ESOPHAGOGASTRODUODENOSCOPY (EGD) WITH PROPOFOL N/A 02/17/2018   Procedure: ESOPHAGOGASTRODUODENOSCOPY (EGD) WITH PROPOFOL;  Surgeon: Daneil Dolin, MD;  [facility];  Service: Endoscopy;  Laterality: N/A;  1:30pm  . FRACTURE SURGERY Left 03/07/2013  knee  . fx left knee Left 2014  . INCISION AND DRAINAGE ABSCESS Right 09/03/2018   Procedure: INCISION AND DRAINAGE RIGHT NECK ABSCESS;  Surgeon: Melida Quitter, MD;  Location: [place];  Service: ENT;  Laterality: Right;  . ORIF TIBIA PLATEAU Left 03/07/2013   Procedure: OPEN REDUCTION INTERNAL FIXATION (ORIF) TIBIAL PLATEAU;  Surgeon: Renette Butters, MD;  Location: [facility] [facility];  Service: Orthopedics;  Laterality: Left;  . REVISION TOTAL HIP ARTHROPLASTY     Right  .  ROTATOR CUFF REPAIR     Bilaterally  . SHOULDER INJECTION Left 03/07/2013   Procedure: SHOULDER INJECTION ; Depomedrol + 0.25% Marcaine mixture 4:1;  Surgeon: Renette Butters, MD;  Location: [place];  Service: Orthopedics;  Laterality: Left;  . TONSILLECTOMY    . TOTAL HIP ARTHROPLASTY Left 06/01/2016   Procedure: LEFT TOTAL HIP ARTHROPLASTY ANTERIOR APPROACH;  Surgeon: Gaynelle Arabian, MD;  [facility];  Service: Orthopedics;  Laterality: Left;  [name] VESICOVAGINAL FISTULA CLOSURE W/ TAH      Home Medications:  Allergies as of 11/14/2019      Reactions   Codeine Other (See Comments)   REACTION: headaches   Dilaudid [hydromorphone Hcl] Itching   Morphine And Related Itching, Other (See Comments)   Side effect   Sulfonamide Derivatives Rash      Medication List       Accurate as of November 14, 2019  2:27 PM. If you have any questions, ask your nurse or doctor.        acetaminophen 500 MG tablet Commonly known as: TYLENOL Take 1,000 mg by mouth every 6 (six) hours as needed.   ALPRAZolam 1 MG tablet Commonly known as: XANAX Take 1-2 tablets (1-2 mg total) by mouth See admin instructions. Take 1 mg by mouth in the morning, take 1 mg by mouth at 1200, take 1 mg by mouth at 1600 and take 2 mg by mouth at bedtime   ARIPiprazole 5 MG tablet Commonly known as: ABILIFY Take 5 mg by mouth daily.   aspirin 81 MG tablet Take 81 mg by mouth daily.   Besivance 0.6 % Susp Generic drug: Besifloxacin HCl   Biotin 10 MG Caps Take 10 mg by mouth daily.   CINNAMON PO Take 1,000 mg by mouth 2 (two) times daily.   ciprofloxacin 0.3 % ophthalmic solution Commonly known as: CILOXAN SMARTSIG:1 In Eye(s)   CLINDAMYCIN HCL PO Take by mouth as needed. Only went she gets dental work done   clindamycin 300 MG capsule Commonly known as: CLEOCIN Take by mouth.   clonazePAM 2 MG tablet Commonly known as: KLONOPIN TAKE 1/2 TABLET BY MOUTHE3 TIMES DAILY AND 1 AT BEDTIME.   cyclobenzaprine  10 MG tablet Commonly known as: FLEXERIL Take 30 mg by mouth at bedtime.   D3-1000 25 MCG (1000 UT) capsule Generic drug: Cholecalciferol Take 1,000 Units by mouth daily.   divalproex 500 MG DR tablet Commonly known as: Depakote One tablet in the morning and 2 in the evening   divalproex 250 MG 24 hr tablet Commonly known as: DEPAKOTE ER Take by mouth.   FISH OIL PO Take by mouth.   FLAX SEED OIL PO Take 1,200 mg by mouth daily.   glimepiride 1 MG tablet Commonly known as: AMARYL Take 1 tablet by mouth daily.   glimepiride 2 MG tablet Commonly known as: AMARYL Take by mouth.   ibuprofen 800 MG tablet Commonly known as: ADVIL Take 1 tablet by mouth 4 (four) times  daily as needed.   Melatonin 10 MG Tabs Take by mouth.   metFORMIN 1000 MG tablet Commonly known as: GLUCOPHAGE Take 1 tablet by mouth 2 (two) times a day.   methylphenidate 20 MG tablet Commonly known as: RITALIN Take 20 mg by mouth 3 (three) times daily with meals.   naproxen sodium 220 MG tablet Commonly known as: ALEVE Take 440 mg by mouth 2 (two) times daily as needed (for pain or headache).   OCUVITE ADULT 50+ PO Take 1 capsule by mouth 2 (two) times daily.   omeprazole 20 MG capsule Commonly known as: PRILOSEC TAKE (1) CAPSULE BY MOUTH TWICE DAILY.   ondansetron 4 MG tablet Commonly known as: ZOFRAN Take 1 tablet (4 mg total) by mouth every 8 (eight) hours as needed for nausea or vomiting.   prazosin 1 MG capsule Commonly known as: MINIPRESS Take 1 mg by mouth at bedtime.   rosuvastatin 20 MG tablet Commonly known as: CRESTOR TAKE (1) TABLET BY MOUTH ONCE DAILY. What changed: See the new instructions.   SUPER B COMPLEX PO Take 1 tablet by mouth daily.   traZODone 50 MG tablet Commonly known as: DESYREL Take 50 mg by mouth at bedtime.   venlafaxine XR 150 MG 24 hr capsule Commonly known as: EFFEXOR-XR Take 150 mg by mouth daily with breakfast.   vitamin C 100 MG  tablet Take 100 mg by mouth daily.   vitamin E 180 MG (400 UNITS) capsule Take 400 Units by mouth. Takes occassionally       Allergies:  Allergies  Allergen Reactions  . Codeine Other (See Comments)    REACTION: headaches  . Dilaudid [Hydromorphone Hcl] Itching  . Morphine And Related Itching and Other (See Comments)    Side effect  . Sulfonamide Derivatives Rash    Family History: Family History  Problem Relation Age of Onset  . Heart disease Father 73  . Atrial fibrillation Mother   . Cancer Mother        carcinoid  . Diabetes Maternal Grandfather   . Cancer Maternal Grandfather   . Heart disease Maternal Grandmother   . Thyroid disease Son     Social History:  reports that she has been smoking cigarettes. She started smoking about 48 years ago. She has a 20.00 pack-year smoking history. She has never used smokeless tobacco. She reports that she does not drink alcohol and does not use drugs.  ROS: All other review of systems were reviewed and are negative except what is noted above in HPI  Physical Exam: BP (!) 173/85   Pulse 85   Temp 98.6 F (37 C)   Ht 5\' 3"  (1.6 m)   Wt 180 lb (81.6 kg)   BMI 31.89 kg/m   Constitutional:  Alert and oriented, No acute distress. HEENT: [place] AT, moist mucus membranes.  Trachea midline, no masses. Cardiovascular: No clubbing, cyanosis, or edema. Respiratory: Normal respiratory effort, no increased work of breathing. GI: Abdomen is soft, nontender, nondistended, no abdominal masses GU: No CVA tenderness.  Lymph: No cervical or inguinal lymphadenopathy. Skin: No rashes, bruises or suspicious lesions. Neurologic: Grossly intact, no focal deficits, moving all 4 extremities. Psychiatric: Normal mood and affect.  Laboratory Data: Lab Results  Component Value Date   WBC 10.1 10/12/2019   HGB 15.0 10/12/2019   HCT 43.7 10/12/2019   MCV 90 10/12/2019   PLT 235 10/12/2019    Lab Results  Component Value Date   CREATININE  0.77 10/12/2019  No results found for: PSA  No results found for: TESTOSTERONE  Lab Results  Component Value Date   HGBA1C 8.8 (H) 09/02/2018    Urinalysis    Component Value Date/Time   COLORURINE YELLOW 09/02/2018 1151   APPEARANCEUR HAZY (A) 09/02/2018 1151   LABSPEC >1.046 (H) 09/02/2018 1151   PHURINE 5.0 09/02/2018 1151   GLUCOSEU 150 (A) 09/02/2018 1151   HGBUR SMALL (A) 09/02/2018 1151   BILIRUBINUR NEGATIVE 09/02/2018 1151   KETONESUR NEGATIVE 09/02/2018 1151   PROTEINUR 30 (A) 09/02/2018 1151   UROBILINOGEN 0.2 12/16/2007 1404   NITRITE NEGATIVE 09/02/2018 1151   LEUKOCYTESUR NEGATIVE 09/02/2018 1151    Lab Results  Component Value Date   BACTERIA RARE (A) 09/02/2018    Pertinent Imaging:  No results found for this or any previous visit.  No results found for this or any previous visit.  No results found for this or any previous visit.  No results found for this or any previous visit.  No results found for this or any previous visit.  No results found for this or any previous visit.  No results found for this or any previous visit.  No results found for this or any previous visit.   Assessment & Plan:    1. Urinary incontinence, unspecified type -We will trial mirabegron 25mg  daily -RTC 4 weeks with PVR  - Urinalysis, Routine w reflex microscopic - BLADDER SCAN AMB NON-IMAGING   No follow-ups on file.  Nicolette Bang, MD  [facility] [place]

## 2019-11-14 NOTE — Patient Instructions (Signed)

## 2019-11-14 NOTE — Progress Notes (Signed)
Urological Symptom Review  Patient is experiencing the following symptoms: Frequent urination Hard to postpone urination Get up at night to urinate Leakage of urine Stream starts and stops   Review of Systems  Gastrointestinal (upper)  : Nausea Vomiting Indigestion/heartburn  Gastrointestinal (lower) : Diarrhea  Constitutional : Night Sweats Fatigue  Skin: Negative for skin symptoms  Eyes: Blurred vision  Ear/Nose/Throat : Sinus problems  Hematologic/Lymphatic: Negative for Hematologic/Lymphatic symptoms  Cardiovascular : Negative for cardiovascular symptoms  Respiratory : Cough  Endocrine: Excessive thirst  Musculoskeletal: Joint pain  Neurological: Headaches Dizziness  Psychologic: Depression Anxiety

## 2019-11-20 ENCOUNTER — Other Ambulatory Visit: Payer: Self-pay

## 2019-11-20 ENCOUNTER — Encounter (INDEPENDENT_AMBULATORY_CARE_PROVIDER_SITE_OTHER): Payer: Medicare Other | Admitting: Ophthalmology

## 2019-11-20 DIAGNOSIS — H35033 Hypertensive retinopathy, bilateral: Secondary | ICD-10-CM | POA: Diagnosis not present

## 2019-11-20 DIAGNOSIS — D3132 Benign neoplasm of left choroid: Secondary | ICD-10-CM

## 2019-11-20 DIAGNOSIS — I10 Essential (primary) hypertension: Secondary | ICD-10-CM | POA: Diagnosis not present

## 2019-11-20 DIAGNOSIS — H43813 Vitreous degeneration, bilateral: Secondary | ICD-10-CM

## 2019-11-20 DIAGNOSIS — H353231 Exudative age-related macular degeneration, bilateral, with active choroidal neovascularization: Secondary | ICD-10-CM

## 2019-11-28 ENCOUNTER — Telehealth: Payer: Self-pay

## 2019-11-28 NOTE — Telephone Encounter (Signed)
PA for omeprazole 20mg  bid done and approved. Informed pharmacy.

## 2019-12-05 ENCOUNTER — Telehealth: Payer: Self-pay

## 2019-12-05 ENCOUNTER — Other Ambulatory Visit: Payer: Self-pay

## 2019-12-05 MED ORDER — MIRABEGRON ER 25 MG PO TB24: 25.0000 mg | ORAL_TABLET | Freq: Every day | ORAL | 2 refills | Status: DC

## 2019-12-05 NOTE — Telephone Encounter (Signed)
Pt sister called saying the Myrbetriq worked well. She asked for rx d/t Dr. Alyson Ingles giving samples and said he would write rx if needed. Rx sent and pharmacy changed at sister request.

## 2019-12-18 ENCOUNTER — Other Ambulatory Visit: Payer: Self-pay

## 2019-12-18 ENCOUNTER — Encounter (INDEPENDENT_AMBULATORY_CARE_PROVIDER_SITE_OTHER): Payer: Medicare Other | Admitting: Ophthalmology

## 2019-12-18 DIAGNOSIS — I10 Essential (primary) hypertension: Secondary | ICD-10-CM | POA: Diagnosis not present

## 2019-12-18 DIAGNOSIS — H43813 Vitreous degeneration, bilateral: Secondary | ICD-10-CM | POA: Diagnosis not present

## 2019-12-18 DIAGNOSIS — H2511 Age-related nuclear cataract, right eye: Secondary | ICD-10-CM

## 2019-12-18 DIAGNOSIS — H353231 Exudative age-related macular degeneration, bilateral, with active choroidal neovascularization: Secondary | ICD-10-CM

## 2019-12-18 DIAGNOSIS — H35033 Hypertensive retinopathy, bilateral: Secondary | ICD-10-CM

## 2019-12-26 ENCOUNTER — Encounter: Payer: Self-pay | Admitting: Urology

## 2019-12-26 ENCOUNTER — Ambulatory Visit (INDEPENDENT_AMBULATORY_CARE_PROVIDER_SITE_OTHER): Payer: Medicare Other | Admitting: Urology

## 2019-12-26 ENCOUNTER — Other Ambulatory Visit: Payer: Self-pay

## 2019-12-26 VITALS — BP 119/69 | HR 91 | Temp 98.3°F | Ht 63.0 in | Wt 180.0 lb

## 2019-12-26 DIAGNOSIS — R32 Unspecified urinary incontinence: Secondary | ICD-10-CM

## 2019-12-26 LAB — URINALYSIS, ROUTINE W REFLEX MICROSCOPIC
Bilirubin, UA: NEGATIVE
Glucose, UA: NEGATIVE
Ketones, UA: NEGATIVE
Leukocytes,UA: NEGATIVE
Nitrite, UA: NEGATIVE
Protein,UA: NEGATIVE
RBC, UA: NEGATIVE
Specific Gravity, UA: 1.01 (ref 1.005–1.030)
Urobilinogen, Ur: 0.2 mg/dL (ref 0.2–1.0)
pH, UA: 6 (ref 5.0–7.5)

## 2019-12-26 LAB — BLADDER SCAN AMB NON-IMAGING: Scan Result: 77

## 2019-12-26 NOTE — Progress Notes (Signed)
PVR=77    Urological Symptom Review  Patient is experiencing the following symptoms: Get up at night to urinate Leakage of urine   Review of Systems  Gastrointestinal (upper)  : Negative for upper GI symptoms  Gastrointestinal (lower) : Negative for lower GI symptoms  Constitutional : Fatigue  Skin: Negative for skin symptoms  Eyes: Blurred vision  Ear/Nose/Throat : Sinus problems  Hematologic/Lymphatic: Negative for Hematologic/Lymphatic symptoms  Cardiovascular : Negative for cardiovascular symptoms  Respiratory : cough  Endocrine: Excessive thirst  Musculoskeletal: Joint pain  Neurological: Headaches  Psychologic: Depression Anxiety

## 2019-12-26 NOTE — Patient Instructions (Signed)

## 2019-12-26 NOTE — Progress Notes (Signed)
12/26/2019 3:45 PM   Adrienne [name] [birth year] [id number]  Referring provider: Asencion Noble, MD [address] [place],  Adrienne [name] [address]  Followup urge incontinence  HPI: [name] is a 64yo here for followup for urge incontinence. Last visit she was started on mirabegron 25mg . She noted resolution of her urge incontinence. Nocturia 1-2x. She is not using pads. She is very happy with her urination.    PMH: Past Medical History:  Diagnosis Date   Allergic rhinitis    Allergy    Anemia    leukocytosis/thrombocytosis--Dr. Tressie Stalker   Anxiety    Arthritis    Asthma    Brain injury ([name])    Closed head   Closed head injury 2005   Common migraine with intractable migraine 04/17/5282   Complication of anesthesia    "my blood pressure dropped"   COPD (chronic obstructive pulmonary disease) ([place])    Depression    ? Bipolar Disorder--Dr, Jimmye Norman   Emphysema of lung ([name])    GERD (gastroesophageal reflux disease)    Barrett's esophagus   Headache    migraines   Hx of migraines    Hypertension    Low back pain    Macular degeneration, bilateral    Dry-Dr. Iona Hansen   Memory difficulty 06/08/2019   Neuromuscular disorder ([facility])    NIDDM (non-insulin dependent diabetes mellitus)    OSA (obstructive sleep apnea)    cannot tolerate mask   RLS (restless legs syndrome)    Seizure disorder ([name])    ? absence; due to head truama   Seizures ([place])    Sleep apnea    Status post right hip replacement    Status post rotator cuff surgery    Visual hallucinations 01/17/2019    Surgical History: Past Surgical History:  Procedure Laterality Date   ABDOMINAL HYSTERECTOMY     heavy bleeding   APPENDECTOMY     BIOPSY  12/27/2014   Procedure: Esophageal BIOPSY;  Surgeon: Daneil Dolin, MD;  Location: [facility];  Service: Endoscopy;;   BIOPSY  02/17/2018   Procedure: BIOPSY;  Surgeon: Daneil Dolin, MD;  Location: [facility];  Service:  Endoscopy;;  (gastric)   COLONOSCOPY WITH ESOPHAGOGASTRODUODENOSCOPY (EGD)  12/08   TCS/EGD: + Barrett's,+ gastritis, friable anal canal, hyperplastic polyp   COLONOSCOPY WITH PROPOFOL N/A 12/27/2014   RMR: Internal hemorrhoids likely source of hematochezia. Redundant colon.    ESOPHAGOGASTRODUODENOSCOPY  11/16/2002   Dr.Rourk- salmon-colored epithelium in the upper esophageal sphincter previous bx= ectopic gastric mucosa, salmon colored epithelium at the distal esophagus proviously bx= barretts esophagus o/w the esophageal mucosa appeared normal. patulous esophagogastric junction, small hiatal hernia, gastric mucosa o/w appeared normal. normal D1 and D2   ESOPHAGOGASTRODUODENOSCOPY (EGD) WITH PROPOFOL N/A 12/27/2014   RMR: Abnoraml distal esophagus consistiant with prior diagnosis fo Barretts esophagus- Status post biopsy. Hiatal hernia. Antral erosions status post gastric biopsy,   ESOPHAGOGASTRODUODENOSCOPY (EGD) WITH PROPOFOL N/A 02/17/2018   Procedure: ESOPHAGOGASTRODUODENOSCOPY (EGD) WITH PROPOFOL;  Surgeon: Daneil Dolin, MD;  [facility]: [facility];  Service: Endoscopy;  Laterality: N/A;  1:30pm   FRACTURE SURGERY Left 03/07/2013   knee   fx left knee Left 2014   INCISION AND DRAINAGE ABSCESS Right 09/03/2018   Procedure: INCISION AND DRAINAGE RIGHT NECK ABSCESS;  Surgeon: Melida Quitter, MD;  Location: [facility] OR;  Service: ENT;  Laterality: Right;   ORIF TIBIA PLATEAU Left 03/07/2013   Procedure: OPEN REDUCTION INTERNAL FIXATION (ORIF) TIBIAL PLATEAU;  Surgeon: Ernesta Amble  Percell Miller, MD;  Location: [facility];  Service: Orthopedics;  Laterality: Left;   REVISION TOTAL HIP ARTHROPLASTY     Right   ROTATOR CUFF REPAIR     Bilaterally   SHOULDER INJECTION Left 03/07/2013   Procedure: SHOULDER INJECTION ; Depomedrol + 0.25% Marcaine mixture 4:1;  Surgeon: Renette Butters, MD;  Location: [facility];  Service: Orthopedics;  Laterality: Left;   TONSILLECTOMY     TOTAL HIP ARTHROPLASTY Left  06/01/2016   Procedure: LEFT TOTAL HIP ARTHROPLASTY ANTERIOR APPROACH;  Surgeon: Gaynelle Arabian, MD;  Location: [facility];  Service: Orthopedics;  Laterality: Left;   VESICOVAGINAL FISTULA CLOSURE W/ TAH      Home Medications:  Allergies as of 12/26/2019      Reactions   Penicillins Rash, Other (See Comments), Itching   Per patient just thrush Has patient had a PCN reaction causing immediate rash, facial/tongue/throat swelling, SOB or lightheadedness with hypotension: Yes Has patient had a PCN reaction causing severe rash involving mucus membranes or skin necrosis: Yes Has patient had a PCN reaction that required hospitalization No Has patient had a PCN reaction occurring within the last 10 years: No If all of the above answers are "NO", then may proceed with Cephalosporin use.   Sulfa Antibiotics Itching, Rash   Hydromorphone    Codeine Other (See Comments)   REACTION: headaches   Dilaudid [hydromorphone Hcl] Itching   Morphine And Related Itching, Other (See Comments)   Side effect   Sulfonamide Derivatives Rash      Medication List       Accurate as of December 26, 2019  3:45 PM. If you have any questions, ask your nurse or doctor.        acetaminophen 500 MG tablet Commonly known as: TYLENOL Take 1,000 mg by mouth every 6 (six) hours as needed.   ALPRAZolam 1 MG tablet Commonly known as: XANAX Take 1-2 tablets (1-2 mg total) by mouth See admin instructions. Take 1 mg by mouth in the morning, take 1 mg by mouth at 1200, take 1 mg by mouth at 1600 and take 2 mg by mouth at bedtime   ARIPiprazole 5 MG tablet Commonly known as: ABILIFY Take 5 mg by mouth daily.   aspirin 81 MG tablet Take 81 mg by mouth daily.   Besivance 0.6 % Susp Generic drug: Besifloxacin HCl   Biotin 10 MG Caps Take 10 mg by mouth daily.   CINNAMON PO Take 1,000 mg by mouth 2 (two) times daily.   ciprofloxacin 0.3 % ophthalmic solution Commonly known as: CILOXAN SMARTSIG:1 In Eye(s)     CLINDAMYCIN HCL PO Take by mouth as needed. Only went she gets dental work done   clindamycin 300 MG capsule Commonly known as: CLEOCIN Take by mouth.   clonazePAM 2 MG tablet Commonly known as: KLONOPIN TAKE 1/2 TABLET BY MOUTHE3 TIMES DAILY AND 1 AT BEDTIME.   cyclobenzaprine 10 MG tablet Commonly known as: FLEXERIL Take 30 mg by mouth at bedtime.   D3-1000 25 MCG (1000 UT) capsule Generic drug: Cholecalciferol Take 1,000 Units by mouth daily.   divalproex 500 MG DR tablet Commonly known as: Depakote One tablet in the morning and 2 in the evening   divalproex 250 MG 24 hr tablet Commonly known as: DEPAKOTE ER Take by mouth.   FISH OIL PO Take by mouth.   FLAX SEED OIL PO Take 1,200 mg by mouth daily.   glimepiride 1 MG tablet Commonly known as: AMARYL Take 1 tablet  by mouth daily.   glimepiride 2 MG tablet Commonly known as: AMARYL Take by mouth.   ibuprofen 800 MG tablet Commonly known as: ADVIL Take 1 tablet by mouth 4 (four) times daily as needed.   Melatonin 10 MG Tabs Take by mouth.   metFORMIN 1000 MG tablet Commonly known as: GLUCOPHAGE Take 1 tablet by mouth 2 (two) times a day.   methylphenidate 20 MG tablet Commonly known as: RITALIN Take 20 mg by mouth 3 (three) times daily with meals.   mirabegron ER 25 MG Tb24 tablet Commonly known as: MYRBETRIQ Take 1 tablet (25 mg total) by mouth daily.   naproxen sodium 220 MG tablet Commonly known as: ALEVE Take 440 mg by mouth 2 (two) times daily as needed (for pain or headache).   OCUVITE ADULT 50+ PO Take 1 capsule by mouth 2 (two) times daily.   omeprazole 20 MG capsule Commonly known as: PRILOSEC TAKE (1) CAPSULE BY MOUTH TWICE DAILY.   ondansetron 4 MG tablet Commonly known as: ZOFRAN Take 1 tablet (4 mg total) by mouth every 8 (eight) hours as needed for nausea or vomiting.   prazosin 1 MG capsule Commonly known as: MINIPRESS Take 1 mg by mouth at bedtime.   rosuvastatin 20 MG  tablet Commonly known as: CRESTOR TAKE (1) TABLET BY MOUTH ONCE DAILY. What changed: See the new instructions.   SUPER B COMPLEX PO Take 1 tablet by mouth daily.   traZODone 50 MG tablet Commonly known as: DESYREL Take 50 mg by mouth at bedtime.   venlafaxine XR 150 MG 24 hr capsule Commonly known as: EFFEXOR-XR Take 150 mg by mouth daily with breakfast.   vitamin C 100 MG tablet Take 100 mg by mouth daily.   vitamin E 180 MG (400 UNITS) capsule Take 400 Units by mouth. Takes occassionally       Allergies:  Allergies  Allergen Reactions   Penicillins Rash, Other (See Comments) and Itching    Per patient just thrush Has patient had a PCN reaction causing immediate rash, facial/tongue/throat swelling, SOB or lightheadedness with hypotension: Yes Has patient had a PCN reaction causing severe rash involving mucus membranes or skin necrosis: Yes Has patient had a PCN reaction that required hospitalization No Has patient had a PCN reaction occurring within the last 10 years: No If all of the above answers are "NO", then may proceed with Cephalosporin use.    Sulfa Antibiotics Itching and Rash   Hydromorphone    Codeine Other (See Comments)    REACTION: headaches   Dilaudid [Hydromorphone Hcl] Itching   Morphine And Related Itching and Other (See Comments)    Side effect   Sulfonamide Derivatives Rash    Family History: Family History  Problem Relation Age of Onset   Heart disease Father 64   Atrial fibrillation Mother    Cancer Mother        carcinoid   Diabetes Maternal Grandfather    Cancer Maternal Grandfather    Heart disease Maternal Grandmother    Thyroid disease Son     Social History:  reports that she has been smoking cigarettes. She started smoking about 48 years ago. She has a 49.00 pack-year smoking history. She has never used smokeless tobacco. She reports that she does not drink alcohol and does not use drugs.  ROS: All other review  of systems were reviewed and are negative except what is noted above in HPI  Physical Exam: BP 119/69    Pulse 91  Temp 98.3 F (36.8 C)    Ht 5\' 3"  (1.6 m)    Wt 180 lb (81.6 kg)    BMI 31.89 kg/m   Constitutional:  Alert and oriented, No acute distress. HEENT: [place] AT, moist mucus membranes.  Trachea midline, no masses. Cardiovascular: No clubbing, cyanosis, or edema. Respiratory: Normal respiratory effort, no increased work of breathing. GI: Abdomen is soft, nontender, nondistended, no abdominal masses GU: No CVA tenderness.  Lymph: No cervical or inguinal lymphadenopathy. Skin: No rashes, bruises or suspicious lesions. Neurologic: Grossly intact, no focal deficits, moving all 4 extremities. Psychiatric: Normal mood and affect.  Laboratory Data: Lab Results  Component Value Date   WBC 10.1 10/12/2019   HGB 15.0 10/12/2019   HCT 43.7 10/12/2019   MCV 90 10/12/2019   PLT 235 10/12/2019    Lab Results  Component Value Date   CREATININE 0.77 10/12/2019    No results found for: PSA  No results found for: TESTOSTERONE  Lab Results  Component Value Date   HGBA1C 8.8 (H) 09/02/2018    Urinalysis    Component Value Date/Time   COLORURINE YELLOW 09/02/2018 1151   APPEARANCEUR Clear 11/14/2019 1430   LABSPEC >1.046 (H) 09/02/2018 1151   PHURINE 5.0 09/02/2018 1151   GLUCOSEU Negative 11/14/2019 1430   HGBUR SMALL (A) 09/02/2018 1151   BILIRUBINUR Negative 11/14/2019 1430   KETONESUR NEGATIVE 09/02/2018 1151   PROTEINUR Negative 11/14/2019 1430   PROTEINUR 30 (A) 09/02/2018 1151   UROBILINOGEN 0.2 12/16/2007 1404   NITRITE Negative 11/14/2019 1430   NITRITE NEGATIVE 09/02/2018 1151   LEUKOCYTESUR Negative 11/14/2019 1430   LEUKOCYTESUR NEGATIVE 09/02/2018 1151    Lab Results  Component Value Date   LABMICR Comment 11/14/2019   BACTERIA RARE (A) 09/02/2018    Pertinent Imaging:  No results found for this or any previous visit.  No results found for this or  any previous visit.  No results found for this or any previous visit.  No results found for this or any previous visit.  No results found for this or any previous visit.  No results found for this or any previous visit.  No results found for this or any previous visit.  No results found for this or any previous visit.   Assessment & Plan:    1. Urinary incontinence, unspecified type -Continue mirabegron 25mg  daily  - Urinalysis, Routine w reflex microscopic - BLADDER SCAN AMB NON-IMAGING   Return in about 6 months (around 06/25/2020).  Nicolette Bang, MD  [facility]

## 2020-01-15 ENCOUNTER — Other Ambulatory Visit: Payer: Self-pay

## 2020-01-15 ENCOUNTER — Encounter (INDEPENDENT_AMBULATORY_CARE_PROVIDER_SITE_OTHER): Payer: Medicare Other | Admitting: Ophthalmology

## 2020-01-15 DIAGNOSIS — H43813 Vitreous degeneration, bilateral: Secondary | ICD-10-CM

## 2020-01-15 DIAGNOSIS — H35033 Hypertensive retinopathy, bilateral: Secondary | ICD-10-CM | POA: Diagnosis not present

## 2020-01-15 DIAGNOSIS — I10 Essential (primary) hypertension: Secondary | ICD-10-CM | POA: Diagnosis not present

## 2020-01-15 DIAGNOSIS — D3132 Benign neoplasm of left choroid: Secondary | ICD-10-CM

## 2020-01-15 DIAGNOSIS — H353231 Exudative age-related macular degeneration, bilateral, with active choroidal neovascularization: Secondary | ICD-10-CM | POA: Diagnosis not present

## 2020-02-15 ENCOUNTER — Other Ambulatory Visit: Payer: Self-pay

## 2020-02-15 ENCOUNTER — Encounter (INDEPENDENT_AMBULATORY_CARE_PROVIDER_SITE_OTHER): Payer: Medicare Other | Admitting: Ophthalmology

## 2020-02-15 DIAGNOSIS — D3132 Benign neoplasm of left choroid: Secondary | ICD-10-CM

## 2020-02-15 DIAGNOSIS — I10 Essential (primary) hypertension: Secondary | ICD-10-CM

## 2020-02-15 DIAGNOSIS — H35033 Hypertensive retinopathy, bilateral: Secondary | ICD-10-CM | POA: Diagnosis not present

## 2020-02-15 DIAGNOSIS — H43813 Vitreous degeneration, bilateral: Secondary | ICD-10-CM

## 2020-02-15 DIAGNOSIS — H353231 Exudative age-related macular degeneration, bilateral, with active choroidal neovascularization: Secondary | ICD-10-CM

## 2020-03-14 ENCOUNTER — Encounter (INDEPENDENT_AMBULATORY_CARE_PROVIDER_SITE_OTHER): Payer: Medicare Other | Admitting: Ophthalmology

## 2020-03-27 ENCOUNTER — Ambulatory Visit: Payer: Medicare Other | Admitting: Urology

## 2020-03-28 ENCOUNTER — Encounter (INDEPENDENT_AMBULATORY_CARE_PROVIDER_SITE_OTHER): Payer: PRIVATE HEALTH INSURANCE | Admitting: Ophthalmology

## 2020-03-29 ENCOUNTER — Ambulatory Visit: Payer: Medicare Other | Admitting: Urology

## 2020-04-03 ENCOUNTER — Encounter (INDEPENDENT_AMBULATORY_CARE_PROVIDER_SITE_OTHER): Payer: PRIVATE HEALTH INSURANCE | Admitting: Ophthalmology

## 2020-04-12 ENCOUNTER — Other Ambulatory Visit: Payer: Self-pay

## 2020-04-12 ENCOUNTER — Encounter (INDEPENDENT_AMBULATORY_CARE_PROVIDER_SITE_OTHER): Payer: Medicare Other | Admitting: Ophthalmology

## 2020-04-12 DIAGNOSIS — H35033 Hypertensive retinopathy, bilateral: Secondary | ICD-10-CM

## 2020-04-12 DIAGNOSIS — I10 Essential (primary) hypertension: Secondary | ICD-10-CM

## 2020-04-12 DIAGNOSIS — H353231 Exudative age-related macular degeneration, bilateral, with active choroidal neovascularization: Secondary | ICD-10-CM | POA: Diagnosis not present

## 2020-04-12 DIAGNOSIS — D3132 Benign neoplasm of left choroid: Secondary | ICD-10-CM

## 2020-04-12 DIAGNOSIS — H43813 Vitreous degeneration, bilateral: Secondary | ICD-10-CM

## 2020-04-22 ENCOUNTER — Other Ambulatory Visit: Payer: Self-pay | Admitting: Internal Medicine

## 2020-04-22 ENCOUNTER — Encounter: Payer: Self-pay | Admitting: Internal Medicine

## 2020-04-23 ENCOUNTER — Other Ambulatory Visit (HOSPITAL_COMMUNITY): Payer: Self-pay | Admitting: Internal Medicine

## 2020-04-23 DIAGNOSIS — I482 Chronic atrial fibrillation, unspecified: Secondary | ICD-10-CM

## 2020-04-25 ENCOUNTER — Ambulatory Visit: Payer: Medicare Other | Admitting: Neurology

## 2020-04-26 ENCOUNTER — Ambulatory Visit (HOSPITAL_COMMUNITY)
Admission: RE | Admit: 2020-04-26 | Discharge: 2020-04-26 | Disposition: A | Discharge status: Home / Self Care | Payer: Medicare Other | Source: Ambulatory Visit | Attending: Internal Medicine | Admitting: Internal Medicine

## 2020-04-26 ENCOUNTER — Other Ambulatory Visit: Payer: Self-pay

## 2020-04-26 DIAGNOSIS — I482 Chronic atrial fibrillation, unspecified: Secondary | ICD-10-CM | POA: Diagnosis not present

## 2020-04-26 LAB — ECHOCARDIOGRAM COMPLETE
Area-P 1/2: 2.24 cm2
S' Lateral: 2.72 cm

## 2020-04-26 NOTE — Progress Notes (Signed)
*  PRELIMINARY RESULTS* Echocardiogram 2D Echocardiogram has been performed.  Adrienne [name] 04/26/2020, 11:27 AM

## 2020-05-10 ENCOUNTER — Other Ambulatory Visit: Payer: Self-pay

## 2020-05-10 ENCOUNTER — Encounter (INDEPENDENT_AMBULATORY_CARE_PROVIDER_SITE_OTHER): Payer: Medicare Other | Admitting: Ophthalmology

## 2020-05-10 DIAGNOSIS — H353231 Exudative age-related macular degeneration, bilateral, with active choroidal neovascularization: Secondary | ICD-10-CM

## 2020-05-10 DIAGNOSIS — I10 Essential (primary) hypertension: Secondary | ICD-10-CM | POA: Diagnosis not present

## 2020-05-10 DIAGNOSIS — D3132 Benign neoplasm of left choroid: Secondary | ICD-10-CM

## 2020-05-10 DIAGNOSIS — H35033 Hypertensive retinopathy, bilateral: Secondary | ICD-10-CM | POA: Diagnosis not present

## 2020-05-10 DIAGNOSIS — H43813 Vitreous degeneration, bilateral: Secondary | ICD-10-CM

## 2020-05-17 ENCOUNTER — Ambulatory Visit (INDEPENDENT_AMBULATORY_CARE_PROVIDER_SITE_OTHER): Payer: Medicare Other | Admitting: Urology

## 2020-05-17 ENCOUNTER — Other Ambulatory Visit: Payer: Self-pay

## 2020-05-17 ENCOUNTER — Encounter: Payer: Self-pay | Admitting: Urology

## 2020-05-17 VITALS — BP 132/81 | HR 73 | Wt 177.0 lb

## 2020-05-17 DIAGNOSIS — R32 Unspecified urinary incontinence: Secondary | ICD-10-CM

## 2020-05-17 LAB — URINALYSIS, ROUTINE W REFLEX MICROSCOPIC
Bilirubin, UA: NEGATIVE
Glucose, UA: NEGATIVE
Ketones, UA: NEGATIVE
Leukocytes,UA: NEGATIVE
Nitrite, UA: NEGATIVE
Protein,UA: NEGATIVE
RBC, UA: NEGATIVE
Specific Gravity, UA: 1.01 (ref 1.005–1.030)
Urobilinogen, Ur: 0.2 mg/dL (ref 0.2–1.0)
pH, UA: 7 (ref 5.0–7.5)

## 2020-05-17 MED ORDER — MIRABEGRON ER 25 MG PO TB24
25.0000 mg | ORAL_TABLET | Freq: Every day | ORAL | 2 refills | Status: DC
Start: 2020-05-17 — End: 2020-05-31

## 2020-05-17 NOTE — Progress Notes (Signed)
05/17/2020 2:57 PM   Adrienne [name] 1955/12/02 [id number]  Referring provider: Asencion Noble, MD [address] [place],  [place] [address]  followup OAB  HPI: Adrienne [name] is a 65yo here for followup for OPAB. She is currently managed with mirabegron 25mg  daily. She has resolution of her urge incontinence and urinary urgency. NO nocturia. No other LUTS. No complaints today. She is very pleased with her response to mirabegron.    PMH: Past Medical History:  Diagnosis Date  . Allergic rhinitis   . Allergy   . Anemia    leukocytosis/thrombocytosis--Dr. Tressie Stalker  . Anxiety   . Arthritis   . Asthma   . 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 Closed head injury 2005  . Common migraine with intractable migraine 06/08/2019  . Complication of anesthesia    "my blood pressure dropped"  . COPD (chronic obstructive pulmonary disease) ([place])   . Depression    ? Bipolar Disorder--Dr, Jimmye Norman  . Emphysema of lung ([place])   . GERD (gastroesophageal reflux disease)    Barrett's esophagus  . Headache    migraines  . Hx of migraines   . Hypertension   . Low back pain   . Macular degeneration, bilateral    Dry-Dr. Iona Hansen  . Memory difficulty 06/08/2019  . Neuromuscular disorder ([place])   . NIDDM (non-insulin dependent diabetes mellitus)   . OSA (obstructive sleep apnea)    cannot tolerate mask  . RLS (restless legs syndrome)   . Seizure disorder ([place])    ? absence; due to head truama  . Seizures ([place])   . Sleep apnea   . Status post right hip replacement   . Status post rotator cuff surgery   . Visual hallucinations 01/17/2019    Surgical History: Past Surgical History:  Procedure Laterality Date  . ABDOMINAL HYSTERECTOMY     heavy bleeding  . APPENDECTOMY    . BIOPSY  12/27/2014   Procedure: Esophageal BIOPSY;  Surgeon: Daneil Dolin, MD;  Location: [facility];  Service: Endoscopy;;  . BIOPSY  02/17/2018   Procedure: BIOPSY;  Surgeon: Daneil Dolin, MD;  [facility];  Service: Endoscopy;;  (gastric)  . COLONOSCOPY WITH ESOPHAGOGASTRODUODENOSCOPY (EGD)  12/08   TCS/EGD: + Barrett's,+ gastritis, friable anal canal, hyperplastic polyp  . COLONOSCOPY WITH PROPOFOL N/A 12/27/2014   RMR: Internal hemorrhoids likely source of hematochezia. Redundant colon.   . ESOPHAGOGASTRODUODENOSCOPY  11/16/2002   Dr.Rourk- salmon-colored epithelium in the upper esophageal sphincter previous bx= ectopic gastric mucosa, salmon colored epithelium at the distal esophagus proviously bx= barretts esophagus o/w the esophageal mucosa appeared normal. patulous esophagogastric junction, small hiatal hernia, gastric mucosa o/w appeared normal. normal D1 and D2  . ESOPHAGOGASTRODUODENOSCOPY (EGD) WITH PROPOFOL N/A 12/27/2014   RMR: Abnoraml distal esophagus consistiant with prior diagnosis fo Barretts esophagus- Status post biopsy. Hiatal hernia. Antral erosions status post gastric biopsy,  . ESOPHAGOGASTRODUODENOSCOPY (EGD) WITH PROPOFOL N/A 02/17/2018   Procedure: ESOPHAGOGASTRODUODENOSCOPY (EGD) WITH PROPOFOL;  Surgeon: Daneil Dolin, MD;  [facility];  Service: Endoscopy;  Laterality: N/A;  1:30pm  . FRACTURE SURGERY Left 03/07/2013   knee  . fx left knee Left 2014  . INCISION AND DRAINAGE ABSCESS Right 09/03/2018   Procedure: INCISION AND DRAINAGE RIGHT NECK ABSCESS;  Surgeon: Melida Quitter, MD;  Location: [facility];  Service: ENT;  Laterality: Right;  . ORIF TIBIA PLATEAU Left 03/07/2013   Procedure: OPEN REDUCTION INTERNAL FIXATION (ORIF) TIBIAL PLATEAU;  Surgeon: Renette Butters, MD;  Location: [facility];  Service: Orthopedics;  Laterality: Left;  . REVISION TOTAL HIP ARTHROPLASTY     Right  . ROTATOR CUFF REPAIR     Bilaterally  . SHOULDER INJECTION Left 03/07/2013   Procedure: SHOULDER INJECTION ; Depomedrol + 0.25% Marcaine mixture 4:1;  Surgeon: Renette Butters, MD;  Location: [facility];  Service: Orthopedics;  Laterality: Left;  . TONSILLECTOMY    . TOTAL HIP  ARTHROPLASTY Left 06/01/2016   Procedure: LEFT TOTAL HIP ARTHROPLASTY ANTERIOR APPROACH;  Surgeon: Gaynelle Arabian, MD;  Location: [facility];  Service: Orthopedics;  Laterality: Left;  [name] VESICOVAGINAL FISTULA CLOSURE W/ TAH      Home Medications:  Allergies as of 05/17/2020      Reactions   Penicillins Rash, Other (See Comments), Itching   Per patient just thrush Has patient had a PCN reaction causing immediate rash, facial/tongue/throat swelling, SOB or lightheadedness with hypotension: Yes Has patient had a PCN reaction causing severe rash involving mucus membranes or skin necrosis: Yes Has patient had a PCN reaction that required hospitalization No Has patient had a PCN reaction occurring within the last 10 years: No If all of the above answers are "NO", then may proceed with Cephalosporin use.   Sulfa Antibiotics Itching, Rash   Hydromorphone    Codeine Other (See Comments)   REACTION: headaches   Dilaudid [hydromorphone Hcl] Itching   Morphine And Related Itching, Other (See Comments)   Side effect   Sulfonamide Derivatives Rash      Medication List       Accurate as of May 17, 2020  2:57 PM. If you have any questions, ask your nurse or doctor.        acetaminophen 500 MG tablet Commonly known as: TYLENOL Take 1,000 mg by mouth every 6 (six) hours as needed.   ALPRAZolam 1 MG tablet Commonly known as: XANAX Take 1-2 tablets (1-2 mg total) by mouth See admin instructions. Take 1 mg by mouth in the morning, take 1 mg by mouth at 1200, take 1 mg by mouth at 1600 and take 2 mg by mouth at bedtime   ARIPiprazole 5 MG tablet Commonly known as: ABILIFY Take 5 mg by mouth daily.   aspirin 81 MG tablet Take 81 mg by mouth daily.   Besivance 0.6 % Susp Generic drug: Besifloxacin HCl   Biotin 10 MG Caps Take 10 mg by mouth daily.   Cholecalciferol 25 MCG (1000 UT) capsule Take 1,000 Units by mouth daily.   CINNAMON PO Take 1,000 mg by mouth 2 (two) times daily.    ciprofloxacin 0.3 % ophthalmic solution Commonly known as: CILOXAN SMARTSIG:1 In Eye(s)   CLINDAMYCIN HCL PO Take by mouth as needed. Only went she gets dental work done   clindamycin 300 MG capsule Commonly known as: CLEOCIN Take by mouth.   clonazePAM 2 MG tablet Commonly known as: KLONOPIN TAKE 1/2 TABLET BY MOUTHE3 TIMES DAILY AND 1 AT BEDTIME.   cyclobenzaprine 10 MG tablet Commonly known as: FLEXERIL Take 30 mg by mouth at bedtime.   divalproex 500 MG DR tablet Commonly known as: Depakote One tablet in the morning and 2 in the evening   divalproex 250 MG 24 hr tablet Commonly known as: DEPAKOTE ER Take by mouth.   FISH OIL PO Take by mouth.   FLAX SEED OIL PO Take 1,200 mg by mouth daily.   glimepiride 1 MG tablet Commonly known as: AMARYL Take 1 tablet by  mouth daily.   glimepiride 2 MG tablet Commonly known as: AMARYL Take by mouth.   ibuprofen 800 MG tablet Commonly known as: ADVIL Take 1 tablet by mouth 4 (four) times daily as needed.   Melatonin 10 MG Tabs Take by mouth.   metFORMIN 1000 MG tablet Commonly known as: GLUCOPHAGE Take 1 tablet by mouth 2 (two) times a day.   methylphenidate 20 MG tablet Commonly known as: RITALIN Take 20 mg by mouth 3 (three) times daily with meals.   mirabegron ER 25 MG Tb24 tablet Commonly known as: MYRBETRIQ Take 1 tablet (25 mg total) by mouth daily.   naproxen sodium 220 MG tablet Commonly known as: ALEVE Take 440 mg by mouth 2 (two) times daily as needed (for pain or headache).   OCUVITE ADULT 50+ PO Take 1 capsule by mouth 2 (two) times daily.   omeprazole 20 MG capsule Commonly known as: PRILOSEC Take 1 capsule by mouth twice daily   ondansetron 4 MG tablet Commonly known as: ZOFRAN Take 1 tablet (4 mg total) by mouth every 8 (eight) hours as needed for nausea or vomiting.   prazosin 1 MG capsule Commonly known as: MINIPRESS Take 1 mg by mouth at bedtime.   rosuvastatin 20 MG  tablet Commonly known as: CRESTOR TAKE (1) TABLET BY MOUTH ONCE DAILY. What changed: See the new instructions.   SUPER B COMPLEX PO Take 1 tablet by mouth daily.   traZODone 50 MG tablet Commonly known as: DESYREL Take 50 mg by mouth at bedtime.   venlafaxine XR 150 MG 24 hr capsule Commonly known as: EFFEXOR-XR Take 150 mg by mouth daily with breakfast.   vitamin C 100 MG tablet Take 100 mg by mouth daily.   vitamin E 180 MG (400 UNITS) capsule Take 400 Units by mouth. Takes occassionally       Allergies:  Allergies  Allergen Reactions  . Penicillins Rash, Other (See Comments) and Itching    Per patient just thrush Has patient had a PCN reaction causing immediate rash, facial/tongue/throat swelling, SOB or lightheadedness with hypotension: Yes Has patient had a PCN reaction causing severe rash involving mucus membranes or skin necrosis: Yes Has patient had a PCN reaction that required hospitalization No Has patient had a PCN reaction occurring within the last 10 years: No If all of the above answers are "NO", then may proceed with Cephalosporin use.   . Sulfa Antibiotics Itching and Rash  . Hydromorphone   . Codeine Other (See Comments)    REACTION: headaches  . Dilaudid [Hydromorphone Hcl] Itching  . Morphine And Related Itching and Other (See Comments)    Side effect  . Sulfonamide Derivatives Rash    Family History: Family History  Problem Relation Age of Onset  . Heart disease Father 57  . Atrial fibrillation Mother   . Cancer Mother        carcinoid  . Diabetes Maternal Grandfather   . Cancer Maternal Grandfather   . Heart disease Maternal Grandmother   . Thyroid disease Son     Social History:  reports that she has been smoking cigarettes. She started smoking about 49 years ago. She has a 49.00 pack-year smoking history. She has never used smokeless tobacco. She reports that she does not drink alcohol and does not use drugs.  ROS: All other review  of systems were reviewed and are negative except what is noted above in HPI  Physical Exam: BP 132/81   Pulse 73   Wt  177 lb (80.3 kg)   BMI 31.35 kg/m   Constitutional:  Alert and oriented, No acute distress. HEENT: [place] AT, moist mucus membranes.  Trachea midline, no masses. Cardiovascular: No clubbing, cyanosis, or edema. Respiratory: Normal respiratory effort, no increased work of breathing. GI: Abdomen is soft, nontender, nondistended, no abdominal masses GU: No CVA tenderness.  Lymph: No cervical or inguinal lymphadenopathy. Skin: No rashes, bruises or suspicious lesions. Neurologic: Grossly intact, no focal deficits, moving all 4 extremities. Psychiatric: Normal mood and affect.  Laboratory Data: Lab Results  Component Value Date   WBC 10.1 10/12/2019   HGB 15.0 10/12/2019   HCT 43.7 10/12/2019   MCV 90 10/12/2019   PLT 235 10/12/2019    Lab Results  Component Value Date   CREATININE 0.77 10/12/2019    No results found for: PSA  No results found for: TESTOSTERONE  Lab Results  Component Value Date   HGBA1C 8.8 (H) 09/02/2018    Urinalysis    Component Value Date/Time   COLORURINE YELLOW 09/02/2018 1151   APPEARANCEUR Clear 12/26/2019 1717   LABSPEC >1.046 (H) 09/02/2018 1151   PHURINE 5.0 09/02/2018 1151   GLUCOSEU Negative 12/26/2019 1717   HGBUR SMALL (A) 09/02/2018 1151   BILIRUBINUR Negative 12/26/2019 1717   KETONESUR NEGATIVE 09/02/2018 1151   PROTEINUR Negative 12/26/2019 1717   PROTEINUR 30 (A) 09/02/2018 1151   UROBILINOGEN 0.2 12/16/2007 1404   NITRITE Negative 12/26/2019 1717   NITRITE NEGATIVE 09/02/2018 1151   LEUKOCYTESUR Negative 12/26/2019 1717   LEUKOCYTESUR NEGATIVE 09/02/2018 1151    Lab Results  Component Value Date   LABMICR Comment 12/26/2019   BACTERIA RARE (A) 09/02/2018    Pertinent Imaging:  No results found for this or any previous visit.  No results found for this or any previous visit.  No results found for  this or any previous visit.  No results found for this or any previous visit.  No results found for this or any previous visit.  No results found for this or any previous visit.  No results found for this or any previous visit.  No results found for this or any previous visit.   Assessment & Plan:    1. Urinary incontinence, unspecified type -continue mirabegron 25mg  daily - Urinalysis, Routine w reflex microscopic   Return in about 6 months (around 11/17/2020).  Nicolette Bang, MD  [facility] [place]

## 2020-05-17 NOTE — Progress Notes (Signed)

## 2020-05-17 NOTE — Patient Instructions (Signed)
Overactive Bladder, Adult  Overactive bladder is a condition in which a person has a sudden and frequent need to urinate. A person might also leak urine if he or she cannot get to the bathroom fast enough (urinary incontinence). Sometimes, symptoms can interfere with work or social activities. What are the causes? Overactive bladder is associated with poor nerve signals between your bladder and your brain. Your bladder may get the signal to empty before it is full. You may also have very sensitive muscles that make your bladder squeeze too soon. This condition may also be caused by other factors, such as:  Medical conditions: ? Urinary tract infection. ? Infection of nearby tissues. ? Prostate enlargement. ? Bladder stones, inflammation, or tumors. ? Diabetes. ? Muscle or nerve weakness, especially from these conditions:  A spinal cord injury.  Stroke.  Multiple sclerosis.  Parkinson's disease.  Other causes: ? Surgery on the uterus or urethra. ? Drinking too much caffeine or alcohol. ? Certain medicines, especially those that eliminate extra fluid in the body (diuretics). ? Constipation. What increases the risk? You may be at greater risk for overactive bladder if you:  Are an older adult.  Smoke.  Are going through menopause.  Have prostate problems.  Have a neurological disease, such as stroke, dementia, Parkinson's disease, or multiple sclerosis (MS).  Eat or drink alcohol, spicy food, caffeine, and other things that irritate the bladder.  Are overweight or obese. What are the signs or symptoms? Symptoms of this condition include a sudden, strong urge to urinate. Other symptoms include:  Leaking urine.  Urinating 8 or more times a day.  Waking up to urinate 2 or more times overnight. How is this diagnosed? This condition may be diagnosed based on:  Your symptoms and medical history.  A physical exam.  Blood or urine tests to check for possible causes,  such as infection. You may also need to see a health care provider who specializes in urinary tract problems. This is called a urologist. How is this treated? Treatment for overactive bladder depends on the cause of your condition and whether it is mild or severe. Treatment may include:  Bladder training, such as: ? Learning to control the urge to urinate by following a schedule to urinate at regular intervals. ? Doing Kegel exercises to strengthen the pelvic floor muscles that support your bladder.  Special devices, such as: ? Biofeedback. This uses sensors to help you become aware of your body's signals. ? Electrical stimulation. This uses electrodes placed inside the body (implanted) or outside the body. These electrodes send gentle pulses of electricity to strengthen the nerves or muscles that control the bladder. ? Women may use a plastic device, called a pessary, that fits into the vagina and supports the bladder.  Medicines, such as: ? Antibiotics to treat bladder infection. ? Antispasmodics to stop the bladder from releasing urine at the wrong time. ? Tricyclic antidepressants to relax bladder muscles. ? Injections of botulinum toxin type A directly into the bladder tissue to relax bladder muscles.  Surgery, such as: ? A device may be implanted to help manage the nerve signals that control urination. ? An electrode may be implanted to stimulate electrical signals in the bladder. ? A procedure may be done to change the shape of the bladder. This is done only in very severe cases. Follow these instructions at home: Eating and drinking  Make diet or lifestyle changes recommended by your health care provider. These may include: ? Drinking fluids   throughout the day and not only with meals. ? Cutting down on caffeine or alcohol. ? Eating a healthy and balanced diet to prevent constipation. This may include:  Choosing foods that are high in fiber, such as beans, whole grains, and  fresh fruits and vegetables.  Limiting foods that are high in fat and processed sugars, such as fried and sweet foods.   Lifestyle  Lose weight if needed.  Do not use any products that contain nicotine or tobacco. These include cigarettes, chewing tobacco, and vaping devices, such as e-cigarettes. If you need help quitting, ask your health care provider.   General instructions  Take over-the-counter and prescription medicines only as told by your health care provider.  If you were prescribed an antibiotic medicine, take it as told by your health care provider. Do not stop taking the antibiotic even if you start to feel better.  Use any implants or pessary as told by your health care provider.  If needed, wear pads to absorb urine leakage.  Keep a log to track how much and when you drink, and when you need to urinate. This will help your health care provider monitor your condition.  Keep all follow-up visits. This is important. Contact a health care provider if:  You have a fever or chills.  Your symptoms do not get better with treatment.  Your pain and discomfort get worse.  You have more frequent urges to urinate. Get help right away if:  You are not able to control your bladder. Summary  Overactive bladder refers to a condition in which a person has a sudden and frequent need to urinate.  Several conditions may lead to an overactive bladder.  Treatment for overactive bladder depends on the cause and severity of your condition.  Making lifestyle changes, doing Kegel exercises, keeping a log, and taking medicines can help with this condition. This information is not intended to replace advice given to you by your health care provider. Make sure you discuss any questions you have with your health care provider. Document Revised: 11/13/2019 Document Reviewed: 11/13/2019 Elsevier Patient Education  2021 Elsevier Inc.  

## 2020-05-31 ENCOUNTER — Ambulatory Visit (INDEPENDENT_AMBULATORY_CARE_PROVIDER_SITE_OTHER): Payer: Medicare Other | Admitting: Internal Medicine

## 2020-05-31 ENCOUNTER — Encounter: Payer: Self-pay | Admitting: Internal Medicine

## 2020-05-31 ENCOUNTER — Other Ambulatory Visit: Payer: Self-pay

## 2020-05-31 ENCOUNTER — Ambulatory Visit (INDEPENDENT_AMBULATORY_CARE_PROVIDER_SITE_OTHER): Payer: Medicare Other

## 2020-05-31 VITALS — BP 126/74 | HR 92 | Ht 63.0 in | Wt 186.4 lb

## 2020-05-31 DIAGNOSIS — I4891 Unspecified atrial fibrillation: Secondary | ICD-10-CM | POA: Diagnosis not present

## 2020-05-31 NOTE — Patient Instructions (Signed)
Medication Instructions:  Your physician recommends that you continue on your current medications as directed. Please refer to the Current Medication list given to you today.   *If you need a refill on your cardiac medications before your next appointment, please call your pharmacy*   Lab Work: NONE   If you have labs (blood work) drawn today and your tests are completely normal, you will receive your results only by:  [place] (if you have MyChart) OR  A paper copy in the mail If you have any lab test that is abnormal or we need to change your treatment, we will call you to review the results.   Testing/Procedures: Your physician has recommended that you wear an event monitor. Event monitors are medical devices that record the hearts electrical activity. Doctors most often [place] these monitors to diagnose arrhythmias. Arrhythmias are problems with the speed or rhythm of the heartbeat. The monitor is a small, portable device. You can wear one while you do your normal daily activities. This is usually used to diagnose what is causing palpitations/syncope (passing out).   Follow-Up: At [facility], you and your health needs are our priority.  As part of our continuing mission to provide you with exceptional heart care, we have created designated Provider Care Teams.  These Care Teams include your primary Cardiologist (physician) and Advanced Practice Providers (APPs -  Physician Assistants and Nurse Practitioners) who all work together to provide you with the care you need, when you need it.  We recommend signing up for the patient portal called "MyChart".  Sign up information is provided on this After Visit Summary.  MyChart is used to connect with patients for Virtual Visits (Telemedicine).  Patients are able to view lab/test results, encounter notes, upcoming appointments, etc.  Non-urgent messages can be sent to your provider as well.   To learn more about what you can do with  MyChart, go to [email or web address].    Your next appointment:   4 month(s)  The format for your next appointment:   In Person  Provider:   Dorris Carnes, MD   Other Instructions Thank you for choosing [facility]!

## 2020-05-31 NOTE — Progress Notes (Signed)
Cardiology Office Note   Date:  05/31/2020   ID:  Adrienne [name], DOB [birth year], MRN [id number]  PCP:  Asencion Noble, MD  Cardiologist:   Dorris Carnes, MD    Pt presents for evalation of Afib   Referred by R Willey Blade  History of Present Illness: Adrienne [name] is a 65 y.o. female with a history of OSA, GERD, NIDDM, HL  She is followed by [name]   Seen earlier this winter and found to be in atrial fibrllation   She was placed on metoprolol and Eliquis    On repeat clinic visit found to be back in atrial fibrillation  Echo on Apr 26 2020 showed LVEF noraml   Mild LAE  Trace MR  The pt presents for evaluation   SHe denies dizzines  Does not some SOB when heart racing but not at other times           Current Meds  Medication Sig  . acetaminophen (TYLENOL) 500 MG tablet Take 1,000 mg by mouth every 6 (six) hours as needed.  . ALPRAZolam (XANAX) 1 MG tablet Take 1-2 tablets (1-2 mg total) by mouth See admin instructions. Take 1 mg by mouth in the morning, take 1 mg by mouth at 1200, take 1 mg by mouth at 1600 and take 2 mg by mouth at bedtime  . apixaban (ELIQUIS) 5 MG TABS tablet Take 5 mg by mouth in the morning.  . B Complex-C (SUPER B COMPLEX PO) Take 1 tablet by mouth daily.   [name] BESIVANCE 0.6 % SUSP   . Cholecalciferol 25 MCG (1000 UT) capsule Take 1,000 Units by mouth daily.   . ciprofloxacin (CILOXAN) 0.3 % ophthalmic solution SMARTSIG:1 In Eye(s)  . CLINDAMYCIN HCL PO Take by mouth as needed. Only went she gets dental work done  . cyclobenzaprine (FLEXERIL) 10 MG tablet Take 30 mg by mouth at bedtime.   [name] glimepiride (AMARYL) 1 MG tablet Take 1 tablet by mouth daily.  . Melatonin 10 MG TABS Take by mouth.  . metFORMIN (GLUCOPHAGE) 1000 MG tablet Take 1 tablet by mouth 2 (two) times a day.  . methylphenidate (RITALIN) 20 MG tablet Take 20 mg by mouth 3 (three) times daily with meals.   . metoprolol tartrate (LOPRESSOR) 25 MG tablet Take 25 mg by mouth 2 (two) times daily.  .  mirabegron ER (MYRBETRIQ) 25 MG TB24 tablet Take by mouth. Take 1/2 tab every other day.  [name] omeprazole (PRILOSEC) 20 MG capsule Take 1 capsule by mouth twice daily  . ondansetron (ZOFRAN) 4 MG tablet Take 1 tablet (4 mg total) by mouth every 8 (eight) hours as needed for nausea or vomiting.  . prazosin (MINIPRESS) 1 MG capsule Take 1 mg by mouth at bedtime.  . rosuvastatin (CRESTOR) 20 MG tablet TAKE (1) TABLET BY MOUTH ONCE DAILY. (Patient taking differently: Take 20 mg by mouth daily.)  . traZODone (DESYREL) 50 MG tablet Take 50 mg by mouth at bedtime.  [name] venlafaxine XR (EFFEXOR-XR) 150 MG 24 hr capsule Take 150 mg by mouth daily with breakfast.     Allergies:   Penicillins, Sulfa antibiotics, Hydromorphone, Morphine, Codeine, Dilaudid [hydromorphone hcl], Morphine and related, and Sulfonamide derivatives   Past Medical History:  Diagnosis Date  . Allergic rhinitis   . Allergy   . Anemia    leukocytosis/thrombocytosis--Dr. Tressie Stalker  . Anxiety   . Arthritis   . Asthma   . 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  Closed head injury 2005  . Common migraine with intractable migraine 06/08/2019  . Complication of anesthesia    "my blood pressure dropped"  . COPD (chronic obstructive pulmonary disease) ([place])   . Depression    ? Bipolar Disorder--Dr, Jimmye Norman  . Emphysema of lung ([place])   . GERD (gastroesophageal reflux disease)    Barrett's esophagus  . Headache    migraines  . Hx of migraines   . Hypertension   . Low back pain   . Macular degeneration, bilateral    Dry-Dr. Iona Hansen  . Memory difficulty 06/08/2019  . Neuromuscular disorder ([place])   . NIDDM (non-insulin dependent diabetes mellitus)   . OSA (obstructive sleep apnea)    cannot tolerate mask  . RLS (restless legs syndrome)   . Seizure disorder ([place])    ? absence; due to head truama  . Seizures ([place])   . Sleep apnea   . Status post right hip replacement   . Status post rotator cuff surgery   . Visual hallucinations  01/17/2019    Past Surgical History:  Procedure Laterality Date  . ABDOMINAL HYSTERECTOMY     heavy bleeding  . APPENDECTOMY    . BIOPSY  12/27/2014   Procedure: Esophageal BIOPSY;  Surgeon: Daneil Dolin, MD;  [facility];  Service: Endoscopy;;  . BIOPSY  02/17/2018   Procedure: BIOPSY;  Surgeon: Daneil Dolin, MD;  [facility];  Service: Endoscopy;;  (gastric)  . COLONOSCOPY WITH ESOPHAGOGASTRODUODENOSCOPY (EGD)  12/08   TCS/EGD: + Barrett's,+ gastritis, friable anal canal, hyperplastic polyp  . COLONOSCOPY WITH PROPOFOL N/A 12/27/2014   RMR: Internal hemorrhoids likely source of hematochezia. Redundant colon.   . ESOPHAGOGASTRODUODENOSCOPY  11/16/2002   Dr.Rourk- salmon-colored epithelium in the upper esophageal sphincter previous bx= ectopic gastric mucosa, salmon colored epithelium at the distal esophagus proviously bx= barretts esophagus o/w the esophageal mucosa appeared normal. patulous esophagogastric junction, small hiatal hernia, gastric mucosa o/w appeared normal. normal D1 and D2  . ESOPHAGOGASTRODUODENOSCOPY (EGD) WITH PROPOFOL N/A 12/27/2014   RMR: Abnoraml distal esophagus consistiant with prior diagnosis fo Barretts esophagus- Status post biopsy. Hiatal hernia. Antral erosions status post gastric biopsy,  . ESOPHAGOGASTRODUODENOSCOPY (EGD) WITH PROPOFOL N/A 02/17/2018   Procedure: ESOPHAGOGASTRODUODENOSCOPY (EGD) WITH PROPOFOL;  Surgeon: Daneil Dolin, MD;  [facility];  Service: Endoscopy;  Laterality: N/A;  1:30pm  . FRACTURE SURGERY Left 03/07/2013   knee  . fx left knee Left 2014  . INCISION AND DRAINAGE ABSCESS Right 09/03/2018   Procedure: INCISION AND DRAINAGE RIGHT NECK ABSCESS;  Surgeon: Melida Quitter, MD;  Location: [place];  Service: ENT;  Laterality: Right;  . ORIF TIBIA PLATEAU Left 03/07/2013   Procedure: OPEN REDUCTION INTERNAL FIXATION (ORIF) TIBIAL PLATEAU;  Surgeon: Renette Butters, MD;  Location: [place];  Service:  Orthopedics;  Laterality: Left;  . REVISION TOTAL HIP ARTHROPLASTY     Right  . ROTATOR CUFF REPAIR     Bilaterally  . SHOULDER INJECTION Left 03/07/2013   Procedure: SHOULDER INJECTION ; Depomedrol + 0.25% Marcaine mixture 4:1;  Surgeon: Renette Butters, MD;  Location: [place];  Service: Orthopedics;  Laterality: Left;  . TONSILLECTOMY    . TOTAL HIP ARTHROPLASTY Left 06/01/2016   Procedure: LEFT TOTAL HIP ARTHROPLASTY ANTERIOR APPROACH;  Surgeon: Gaynelle Arabian, MD;  [facility];  Service: Orthopedics;  Laterality: Left;  [name] VESICOVAGINAL FISTULA CLOSURE W/ TAH       Social History:  The patient  reports  that she has been smoking cigarettes. She started smoking about 49 years ago. She has a 49.00 pack-year smoking history. She has never used smokeless tobacco. She reports that she does not drink alcohol and does not use drugs.   Family History:  The patient's family history includes Atrial fibrillation in her mother; Cancer in her maternal grandfather and mother; Diabetes in her maternal grandfather; Heart disease in her maternal grandmother; Heart disease (age of onset: 60) in her father; Thyroid disease in her son.    ROS:  Please see the history of present illness. All other systems are reviewed and  Negative to the above problem except as noted.    PHYSICAL EXAM: VS:  BP 126/74 (BP Location: Left Arm, Patient Position: Sitting, Cuff Size: Normal)   Pulse 92   Ht 5\' 3"  (1.6 m)   Wt 186 lb 6.4 oz (84.6 kg)   SpO2 96%   BMI 33.02 kg/m   GEN: OBese 65 yo in no acute distress  HEENT: normal  Neck: no JVD, carotid bruits Cardiac: RRR; no murmurs  NO LE edema  Respiratory:    Mild rhonchi that clears with cough  GI: soft, nontender, nondistended, + BS  No hepatomegaly  MS: no deformity Moving all extremities   Skin: warm and dry, no rash Neuro:  Strength and sensation are intact Psych: euthymic mood, full affect   EKG:   SR 60 bpm   Echo:  04/26/20  1. Left ventricular  ejection fraction, by estimation, is 60 to 65%. The left ventricle has normal function. The left ventricle has no regional wall motion abnormalities. There is moderate asymmetric left ventricular hypertrophy of the basal and septal segments. Left ventricular diastolic parameters were normal. 2. Right ventricular systolic function is normal. The right ventricular size is normal. 3. Left atrial size was mildly dilated. 4. The mitral valve is normal in structure. Trivial mitral valve regurgitation. No evidence of mitral stenosis. 5. The aortic valve is tricuspid. There is moderate calcification of the aortic valve. Aortic valve regurgitation is not visualized. Mild to moderate aortic valve sclerosis/calcification is present, without any evidence of aortic stenosis. 6. The inferior vena cava is normal in size with greater than 50% respiratory variability, suggesting right atrial pressure of 3 mmHg. E Lipid Panel    Component Value Date/Time   CHOL 91 12/09/2016 1151   TRIG 90 12/09/2016 1151   HDL 53 12/09/2016 1151   CHOLHDL 1.7 12/09/2016 1151   VLDL 29 05/22/2016 1407   LDLCALC 21 12/09/2016 1151      Wt Readings from Last 3 Encounters:  05/31/20 186 lb 6.4 oz (84.6 kg)  05/17/20 177 lb (80.3 kg)  12/26/19 180 lb (81.6 kg)      ASSESSMENT AND PLAN:  1  PAF  PT recently diagnosed with afib   Her CHADSVASc score is 4.  Keep on anticoagulation   The pt complains of SOB with afib   Need to document how much afib she is having and how this correlates with symtpoms   Will set up for an event monitor  The pt says her copays for Elqius are very high  She is actually taking in daily  Confirmed that it isa bid drug  Will see about assistance and also check on Xarelto     2  Hx HTN  BP is controlled  3  HL  Need to follow lipdis     Tentative f/u in 4 months   May be sooner based  on test results   Current medicines are reviewed at length with the patient today.  The patient does  not have concerns regarding medicines.  Signed, Dorris Carnes, MD  05/31/2020 10:34 AM    [facility] [address], [place], [place]  [address] Phone: [phone]; Fax: [phone]

## 2020-06-01 ENCOUNTER — Telehealth: Payer: Self-pay | Admitting: Internal Medicine

## 2020-06-01 NOTE — Telephone Encounter (Signed)
Monitor auto transmission of atrial fibrillation. Routed to Dr. Harrington Challenger

## 2020-06-03 ENCOUNTER — Other Ambulatory Visit: Payer: Self-pay

## 2020-06-03 NOTE — Addendum Note (Signed)
Addended by: Barbarann Ehlers A on: 06/03/2020 07:54 AM   Modules accepted: Orders

## 2020-06-04 NOTE — Telephone Encounter (Signed)
Pt with known AFIB   ON anticoagulation. Has monitor to confirm "spells" that she is having and to establish burden of afib

## 2020-06-06 ENCOUNTER — Telehealth: Payer: Self-pay | Admitting: Internal Medicine

## 2020-06-06 NOTE — Telephone Encounter (Signed)
NEW MESSAGE    PATIENT RETURNING CALL TO Alda Berthold

## 2020-06-07 NOTE — Telephone Encounter (Signed)
Spoke with Hilda Blades pt's sister. Who states that pt is completing pt assistance forms for her blood thinner. Sister given the number to Eliquis 360 support.

## 2020-06-10 ENCOUNTER — Encounter (INDEPENDENT_AMBULATORY_CARE_PROVIDER_SITE_OTHER): Payer: Medicare Other | Admitting: Ophthalmology

## 2020-06-10 ENCOUNTER — Other Ambulatory Visit: Payer: Self-pay

## 2020-06-10 DIAGNOSIS — H43813 Vitreous degeneration, bilateral: Secondary | ICD-10-CM

## 2020-06-10 DIAGNOSIS — I10 Essential (primary) hypertension: Secondary | ICD-10-CM | POA: Diagnosis not present

## 2020-06-10 DIAGNOSIS — D3132 Benign neoplasm of left choroid: Secondary | ICD-10-CM | POA: Diagnosis not present

## 2020-06-10 DIAGNOSIS — H353231 Exudative age-related macular degeneration, bilateral, with active choroidal neovascularization: Secondary | ICD-10-CM

## 2020-06-10 DIAGNOSIS — H35033 Hypertensive retinopathy, bilateral: Secondary | ICD-10-CM | POA: Diagnosis not present

## 2020-06-29 DIAGNOSIS — I4891 Unspecified atrial fibrillation: Secondary | ICD-10-CM | POA: Diagnosis not present

## 2020-07-08 ENCOUNTER — Encounter (INDEPENDENT_AMBULATORY_CARE_PROVIDER_SITE_OTHER): Payer: Medicare Other | Admitting: Ophthalmology

## 2020-07-08 ENCOUNTER — Other Ambulatory Visit: Payer: Self-pay

## 2020-07-08 DIAGNOSIS — I10 Essential (primary) hypertension: Secondary | ICD-10-CM | POA: Diagnosis not present

## 2020-07-08 DIAGNOSIS — H35033 Hypertensive retinopathy, bilateral: Secondary | ICD-10-CM | POA: Diagnosis not present

## 2020-07-08 DIAGNOSIS — H43813 Vitreous degeneration, bilateral: Secondary | ICD-10-CM

## 2020-07-08 DIAGNOSIS — D3132 Benign neoplasm of left choroid: Secondary | ICD-10-CM | POA: Diagnosis not present

## 2020-07-08 DIAGNOSIS — H353231 Exudative age-related macular degeneration, bilateral, with active choroidal neovascularization: Secondary | ICD-10-CM

## 2020-07-15 ENCOUNTER — Telehealth: Payer: Self-pay | Admitting: *Deleted

## 2020-07-15 MED ORDER — METOPROLOL TARTRATE 50 MG PO TABS: 50.0000 mg | ORAL_TABLET | Freq: Two times a day (BID) | ORAL | 3 refills | Status: DC

## 2020-07-15 NOTE — Telephone Encounter (Signed)
-----   Message from Fay Records, MD sent at 07/14/2020  9:25 PM EDT ----- Monitor show SR and atrial fibrllation    SHe is in afib about 20% of the time. For short times HR is very fast   Her average HR is 75 bpm  Recomm:   Increase metoprolol to 50 mg 2x per day   Follow symptoms Call in a couple weeks    If still having palpitations would add flecanide   Will wait for call from her

## 2020-07-24 ENCOUNTER — Encounter: Payer: Self-pay | Admitting: *Deleted

## 2020-08-06 ENCOUNTER — Ambulatory Visit (INDEPENDENT_AMBULATORY_CARE_PROVIDER_SITE_OTHER): Payer: Medicare Other | Admitting: *Deleted

## 2020-08-06 DIAGNOSIS — I4891 Unspecified atrial fibrillation: Secondary | ICD-10-CM | POA: Diagnosis not present

## 2020-08-06 DIAGNOSIS — Z5181 Encounter for therapeutic drug level monitoring: Secondary | ICD-10-CM | POA: Diagnosis not present

## 2020-08-06 LAB — POCT INR: INR: 3.3 — AB (ref 2.0–3.0)

## 2020-08-06 NOTE — Patient Instructions (Signed)
Hold warfarin tonight then decrease dose to 1 1/2 tablets daily except 1 tablet on Tuesdays and Fridays Recheck in 2 weeks

## 2020-08-08 ENCOUNTER — Encounter (INDEPENDENT_AMBULATORY_CARE_PROVIDER_SITE_OTHER): Payer: Medicare Other | Admitting: Ophthalmology

## 2020-08-08 ENCOUNTER — Other Ambulatory Visit: Payer: Self-pay

## 2020-08-08 DIAGNOSIS — I10 Essential (primary) hypertension: Secondary | ICD-10-CM

## 2020-08-08 DIAGNOSIS — H43813 Vitreous degeneration, bilateral: Secondary | ICD-10-CM

## 2020-08-08 DIAGNOSIS — D3132 Benign neoplasm of left choroid: Secondary | ICD-10-CM

## 2020-08-08 DIAGNOSIS — H35033 Hypertensive retinopathy, bilateral: Secondary | ICD-10-CM | POA: Diagnosis not present

## 2020-08-08 DIAGNOSIS — H353231 Exudative age-related macular degeneration, bilateral, with active choroidal neovascularization: Secondary | ICD-10-CM | POA: Diagnosis not present

## 2020-08-22 ENCOUNTER — Ambulatory Visit (INDEPENDENT_AMBULATORY_CARE_PROVIDER_SITE_OTHER): Payer: Medicare Other | Admitting: *Deleted

## 2020-08-22 ENCOUNTER — Other Ambulatory Visit: Payer: Self-pay

## 2020-08-22 DIAGNOSIS — I4891 Unspecified atrial fibrillation: Secondary | ICD-10-CM

## 2020-08-22 DIAGNOSIS — Z5181 Encounter for therapeutic drug level monitoring: Secondary | ICD-10-CM

## 2020-08-22 LAB — POCT INR: INR: 2.3 (ref 2.0–3.0)

## 2020-08-22 MED ORDER — WARFARIN SODIUM 5 MG PO TABS: ORAL_TABLET | ORAL | 3 refills | Status: DC

## 2020-08-22 NOTE — Patient Instructions (Signed)
Continue warfarin 1 1/2 tablets daily except 1 tablet on Tuesdays and Fridays Recheck in 2 weeks

## 2020-09-05 ENCOUNTER — Ambulatory Visit (INDEPENDENT_AMBULATORY_CARE_PROVIDER_SITE_OTHER): Payer: Medicare Other | Admitting: *Deleted

## 2020-09-05 DIAGNOSIS — Z5181 Encounter for therapeutic drug level monitoring: Secondary | ICD-10-CM

## 2020-09-05 DIAGNOSIS — I4891 Unspecified atrial fibrillation: Secondary | ICD-10-CM | POA: Diagnosis not present

## 2020-09-05 LAB — POCT INR: INR: 2.4 (ref 2.0–3.0)

## 2020-09-05 NOTE — Patient Instructions (Signed)
Continue warfarin 1 1/2 tablets daily except 1 tablet on Tuesdays and Fridays Recheck in 3 weeks

## 2020-09-06 ENCOUNTER — Encounter (INDEPENDENT_AMBULATORY_CARE_PROVIDER_SITE_OTHER): Payer: Medicare Other | Admitting: Ophthalmology

## 2020-09-06 ENCOUNTER — Other Ambulatory Visit: Payer: Self-pay

## 2020-09-06 DIAGNOSIS — H35033 Hypertensive retinopathy, bilateral: Secondary | ICD-10-CM | POA: Diagnosis not present

## 2020-09-06 DIAGNOSIS — I10 Essential (primary) hypertension: Secondary | ICD-10-CM | POA: Diagnosis not present

## 2020-09-06 DIAGNOSIS — H353231 Exudative age-related macular degeneration, bilateral, with active choroidal neovascularization: Secondary | ICD-10-CM | POA: Diagnosis not present

## 2020-09-06 DIAGNOSIS — D3132 Benign neoplasm of left choroid: Secondary | ICD-10-CM | POA: Diagnosis not present

## 2020-09-06 DIAGNOSIS — H43813 Vitreous degeneration, bilateral: Secondary | ICD-10-CM

## 2020-09-26 ENCOUNTER — Telehealth: Payer: Self-pay | Admitting: Urology

## 2020-09-26 ENCOUNTER — Ambulatory Visit (INDEPENDENT_AMBULATORY_CARE_PROVIDER_SITE_OTHER): Payer: Medicare Other | Admitting: *Deleted

## 2020-09-26 DIAGNOSIS — Z5181 Encounter for therapeutic drug level monitoring: Secondary | ICD-10-CM | POA: Diagnosis not present

## 2020-09-26 DIAGNOSIS — I4891 Unspecified atrial fibrillation: Secondary | ICD-10-CM | POA: Diagnosis not present

## 2020-09-26 LAB — POCT INR: INR: 2.4 (ref 2.0–3.0)

## 2020-09-26 NOTE — Patient Instructions (Signed)
Continue warfarin 1 1/2 tablets daily except 1 tablet on Tuesdays and Fridays Recheck in 4 weeks

## 2020-09-26 NOTE — Telephone Encounter (Signed)
Pt's sister called and wanted to get more samples of Myrbetriq. She wasn't sure if she needed to make her an appointment with [facility] or if she could just get samples. Please call her when you can. Thanks!

## 2020-09-30 ENCOUNTER — Other Ambulatory Visit (HOSPITAL_COMMUNITY)
Admission: RE | Admit: 2020-09-30 | Discharge: 2020-09-30 | Disposition: A | Discharge status: Home / Self Care | Payer: Medicare Other | Source: Ambulatory Visit | Attending: Internal Medicine | Admitting: Internal Medicine

## 2020-09-30 ENCOUNTER — Ambulatory Visit (INDEPENDENT_AMBULATORY_CARE_PROVIDER_SITE_OTHER): Payer: Medicare Other | Admitting: Internal Medicine

## 2020-09-30 ENCOUNTER — Encounter: Payer: Self-pay | Admitting: Internal Medicine

## 2020-09-30 ENCOUNTER — Other Ambulatory Visit: Payer: Self-pay

## 2020-09-30 VITALS — BP 120/64 | HR 62 | Ht 63.0 in | Wt 192.0 lb

## 2020-09-30 DIAGNOSIS — R0602 Shortness of breath: Secondary | ICD-10-CM | POA: Insufficient documentation

## 2020-09-30 DIAGNOSIS — I10 Essential (primary) hypertension: Secondary | ICD-10-CM | POA: Diagnosis not present

## 2020-09-30 DIAGNOSIS — R609 Edema, unspecified: Secondary | ICD-10-CM | POA: Diagnosis present

## 2020-09-30 DIAGNOSIS — I4891 Unspecified atrial fibrillation: Secondary | ICD-10-CM

## 2020-09-30 DIAGNOSIS — Z79899 Other long term (current) drug therapy: Secondary | ICD-10-CM

## 2020-09-30 LAB — CBC
HCT: 41.9 % (ref 36.0–46.0)
Hemoglobin: 13.8 g/dL (ref 12.0–15.0)
MCH: 31.8 pg (ref 26.0–34.0)
MCHC: 32.9 g/dL (ref 30.0–36.0)
MCV: 96.5 fL (ref 80.0–100.0)
Platelets: 248 10*3/uL (ref 150–400)
RBC: 4.34 MIL/uL (ref 3.87–5.11)
RDW: 14.1 % (ref 11.5–15.5)
WBC: 7.4 10*3/uL (ref 4.0–10.5)
nRBC: 0 % (ref 0.0–0.2)

## 2020-09-30 LAB — BASIC METABOLIC PANEL
Anion gap: 8 (ref 5–15)
BUN: 36 mg/dL — ABNORMAL HIGH (ref 8–23)
CO2: 24 mmol/L (ref 22–32)
Calcium: 8.5 mg/dL — ABNORMAL LOW (ref 8.9–10.3)
Chloride: 100 mmol/L (ref 98–111)
Creatinine, Ser: 1.15 mg/dL — ABNORMAL HIGH (ref 0.44–1.00)
GFR, Estimated: 53 mL/min — ABNORMAL LOW (ref >60)
Glucose, Bld: 117 mg/dL — ABNORMAL HIGH (ref 70–99)
Potassium: 4.1 mmol/L (ref 3.5–5.1)
Sodium: 132 mmol/L — ABNORMAL LOW (ref 135–145)

## 2020-09-30 LAB — BRAIN NATRIURETIC PEPTIDE: B Natriuretic Peptide: 33 pg/mL (ref 0.0–100.0)

## 2020-09-30 NOTE — Patient Instructions (Signed)
Medication Instructions:  Your physician recommends that you continue on your current medications as directed. Please refer to the Current Medication list given to you today.  *If you need a refill on your cardiac medications before your next appointment, please call your pharmacy*   Lab Work: Your physician recommends that you return for lab work in: Today   If you have labs (blood work) drawn today and your tests are completely normal, you will receive your results only by: MyChart Message (if you have MyChart) OR A paper copy in the mail If you have any lab test that is abnormal or we need to change your treatment, we will call you to review the results.   Testing/Procedures: NONE    Follow-Up: At [facility], you and your health needs are our priority.  As part of our continuing mission to provide you with exceptional heart care, we have created designated Provider Care Teams.  These Care Teams include your primary Cardiologist (physician) and Advanced Practice Providers (APPs -  Physician Assistants and Nurse Practitioners) who all work together to provide you with the care you need, when you need it.  We recommend signing up for the patient portal called "MyChart".  Sign up information is provided on this After Visit Summary.  MyChart is used to connect with patients for Virtual Visits (Telemedicine).  Patients are able to view lab/test results, encounter notes, upcoming appointments, etc.  Non-urgent messages can be sent to your provider as well.   To learn more about what you can do with MyChart, go to [email or web address].    Your next appointment:    March 2023   The format for your next appointment:   In Person  Provider:   Dorris Carnes, MD   Other Instructions Thank you for choosing [facility]!

## 2020-09-30 NOTE — Telephone Encounter (Signed)
Sister will come by office today to pick up samples.

## 2020-09-30 NOTE — Progress Notes (Signed)
Cardiology Office Note   Date:  09/30/2020   ID:  Adrienne [name], DOB 08/06/1955, MRN [id number]  PCP:  Asencion Noble, MD  Cardiologist:   Dorris Carnes, MD    Pt presents for f/u of Afib   History of Present Illness: Adrienne [name] is a 65 y.o. female with a history of OSA, GERD, NIDDM, HL  She is followed by [name]   Seen earlier this winter and found to be in atrial fibrllation (CHADSVASc is 4)  She was placed on metoprolol and Eliquis    On repeat clinic visit found to be back in atrial fibrillation  Echo on Apr 26 2020 showed LVEF noraml     I saw the pt in March 2022  She went on coumadin due to cost   She wore a monitor in May whch showed atrial fibrillation 20% of the time   At times HR fast   Average HR 75    I recomm she increase metoprolol to 50 mg bid   If problems recomm adding flecanide  Since seen the pt denies palpitations  No dizziness   Some LE edema        Current Meds  Medication Sig   acetaminophen (TYLENOL) 500 MG tablet Take 1,000 mg by mouth every 6 (six) hours as needed.   ALPRAZolam (XANAX) 1 MG tablet Take 1-2 tablets (1-2 mg total) by mouth See admin instructions. Take 1 mg by mouth in the morning, take 1 mg by mouth at 1200, take 1 mg by mouth at 1600 and take 2 mg by mouth at bedtime   B Complex-C (SUPER B COMPLEX PO) Take 1 tablet by mouth daily.    BESIVANCE 0.6 % SUSP    Cholecalciferol 25 MCG (1000 UT) capsule Take 1,000 Units by mouth daily.    ciprofloxacin (CILOXAN) 0.3 % ophthalmic solution SMARTSIG:1 In Eye(s)   CLINDAMYCIN HCL PO Take by mouth as needed. Only went she gets dental work done   cyclobenzaprine (FLEXERIL) 10 MG tablet Take 30 mg by mouth at bedtime.    glimepiride (AMARYL) 1 MG tablet Take 1 tablet by mouth daily.   Melatonin 10 MG TABS Take by mouth.   metFORMIN (GLUCOPHAGE) 1000 MG tablet Take 1 tablet by mouth 2 (two) times a day.   methylphenidate (RITALIN) 20 MG tablet Take 20 mg by mouth 3 (three) times daily with meals.     metoprolol tartrate (LOPRESSOR) 50 MG tablet Take 1 tablet (50 mg total) by mouth 2 (two) times daily.   mirabegron ER (MYRBETRIQ) 25 MG TB24 tablet Take by mouth. Take 1/2 tab every other day.   omeprazole (PRILOSEC) 20 MG capsule Take 1 capsule by mouth twice daily   ondansetron (ZOFRAN) 4 MG tablet Take 1 tablet (4 mg total) by mouth every 8 (eight) hours as needed for nausea or vomiting.   prazosin (MINIPRESS) 1 MG capsule Take 1 mg by mouth at bedtime.   rosuvastatin (CRESTOR) 20 MG tablet TAKE (1) TABLET BY MOUTH ONCE DAILY. (Patient taking differently: Take 20 mg by mouth daily.)   traZODone (DESYREL) 50 MG tablet Take 50 mg by mouth at bedtime.   venlafaxine XR (EFFEXOR-XR) 150 MG 24 hr capsule Take 150 mg by mouth daily with breakfast.   warfarin (COUMADIN) 5 MG tablet Take 1 1/2 tablets daily except 1 tablet on Tuesdays and Fridays or as directed     Allergies:   Penicillins, Sulfa antibiotics, Hydromorphone, Morphine, Codeine, Dilaudid [hydromorphone hcl], Morphine  and related, and Sulfonamide derivatives   Past Medical History:  Diagnosis Date   Allergic rhinitis    Allergy    Anemia    leukocytosis/thrombocytosis--Dr. 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 Bipolar Disorder--Dr, Jimmye Norman   Emphysema of lung ([place])    GERD (gastroesophageal reflux disease)    Barrett's esophagus   Headache    migraines   Hx of migraines    Hypertension    Low back pain    Macular degeneration, bilateral    Dry-Dr. Iona Hansen   Memory difficulty 06/08/2019   Neuromuscular disorder ([facility])    NIDDM (non-insulin dependent diabetes mellitus)    OSA (obstructive sleep apnea)    cannot tolerate mask   RLS (restless legs syndrome)    Seizure  disorder ([place])    ? absence; due to head truama   Seizures ([place])    Sleep apnea    Status post right hip replacement    Status post rotator cuff surgery    Visual hallucinations 01/17/2019    Past Surgical History:  Procedure Laterality Date   ABDOMINAL HYSTERECTOMY     heavy bleeding   APPENDECTOMY     BIOPSY  12/27/2014   Procedure: Esophageal BIOPSY;  Surgeon: Daneil Dolin, MD;  [facility];  Service: Endoscopy;;   BIOPSY  02/17/2018   Procedure: BIOPSY;  Surgeon: Daneil Dolin, MD;  [facility];  Service: Endoscopy;;  (gastric)   COLONOSCOPY WITH ESOPHAGOGASTRODUODENOSCOPY (EGD)  12/08   TCS/EGD: + Barrett's,+ gastritis, friable anal canal, hyperplastic polyp   COLONOSCOPY WITH PROPOFOL N/A 12/27/2014   RMR: Internal hemorrhoids likely source of hematochezia. Redundant colon.    ESOPHAGOGASTRODUODENOSCOPY  11/16/2002   Dr.Rourk- salmon-colored epithelium in the upper esophageal sphincter previous bx= ectopic gastric mucosa, salmon colored epithelium at the distal esophagus proviously bx= barretts esophagus o/w the esophageal mucosa appeared normal. patulous esophagogastric junction, small hiatal hernia, gastric mucosa o/w appeared normal. normal D1 and D2   ESOPHAGOGASTRODUODENOSCOPY (EGD) WITH PROPOFOL N/A 12/27/2014   RMR: Abnoraml distal esophagus consistiant with prior diagnosis fo Barretts esophagus- Status post biopsy. Hiatal hernia. Antral erosions status post gastric biopsy,   ESOPHAGOGASTRODUODENOSCOPY (EGD) WITH PROPOFOL N/A 02/17/2018   Procedure: ESOPHAGOGASTRODUODENOSCOPY (EGD) WITH PROPOFOL;  Surgeon: Daneil Dolin, MD;  [facility];  Service: Endoscopy;  Laterality: N/A;  1:30pm   FRACTURE SURGERY Left 03/07/2013   knee   fx left knee Left 2014   INCISION AND DRAINAGE ABSCESS Right 09/03/2018   Procedure: INCISION AND DRAINAGE RIGHT NECK ABSCESS;  Surgeon: Melida Quitter, MD;  Location: [facility];  Service: ENT;  Laterality: Right;   ORIF  TIBIA PLATEAU Left 03/07/2013   Procedure: OPEN REDUCTION INTERNAL FIXATION (ORIF) TIBIAL PLATEAU;  Surgeon: Renette Butters, MD;  Location: [place];  Service: Orthopedics;  Laterality: Left;   REVISION TOTAL HIP ARTHROPLASTY     Right   ROTATOR CUFF REPAIR     Bilaterally   SHOULDER INJECTION Left 03/07/2013   Procedure: SHOULDER INJECTION ; Depomedrol + 0.25% Marcaine mixture 4:1;  Surgeon: Renette Butters, MD;  Location: [place];  Service: Orthopedics;  Laterality: Left;  TONSILLECTOMY     TOTAL HIP ARTHROPLASTY Left 06/01/2016   Procedure: LEFT TOTAL HIP ARTHROPLASTY ANTERIOR APPROACH;  Surgeon: Gaynelle Arabian, MD;  [facility];  Service: Orthopedics;  Laterality: Left;   VESICOVAGINAL FISTULA CLOSURE W/ TAH       Social History:  The patient  reports that she has been smoking cigarettes. She started smoking about 49 years ago. She has a 49.00 pack-year smoking history. She has never used smokeless tobacco. She reports that she does not drink alcohol and does not use drugs.   Family History:  The patient's family history includes Atrial fibrillation in her mother; Cancer in her maternal grandfather and mother; Diabetes in her maternal grandfather; Heart disease in her maternal grandmother; Heart disease (age of onset: 21) in her father; Thyroid disease in her son.    ROS:  Please see the history of present illness. All other systems are reviewed and  Negative to the above problem except as noted.    PHYSICAL EXAM: VS:  BP 120/64   Pulse 62   Ht '5\' 3"'$  (1.6 m)   Wt 192 lb (87.1 kg)   SpO2 93%   BMI 34.01 kg/m   GEN: OBese 65 yo in no acute distress  HEENT: normal  Neck: no JVD, carotid bruits Cardiac: RRR; no murmurs   Tr LE  edema  Respiratory:    Mild rhonchi that clears with cough  GI: soft, nontender, nondistended, + BS  No hepatomegaly  MS: no deformity Moving all extremities   Skin: warm and dry, no rash Neuro:  Strength and sensation are intact Psych: euthymic mood,  full affect   EKG:   Not done today    Echo:  04/26/20  1. Left ventricular ejection fraction, by estimation, is 60 to 65%. The left ventricle has normal function. The left ventricle has no regional wall motion abnormalities. There is moderate asymmetric left ventricular hypertrophy of the basal and septal segments. Left ventricular diastolic parameters were normal. 2. Right ventricular systolic function is normal. The right ventricular size is normal. 3. Left atrial size was mildly dilated. 4. The mitral valve is normal in structure. Trivial mitral valve regurgitation. No evidence of mitral stenosis. 5. The aortic valve is tricuspid. There is moderate calcification of the aortic valve. Aortic valve regurgitation is not visualized. Mild to moderate aortic valve sclerosis/calcification is present, without any evidence of aortic stenosis. 6. The inferior vena cava is normal in size with greater than 50% respiratory variability, suggesting right atrial pressure of 3 mmHg. E Lipid Panel    Component Value Date/Time   CHOL 91 12/09/2016 1151   TRIG 90 12/09/2016 1151   HDL 53 12/09/2016 1151   CHOLHDL 1.7 12/09/2016 1151   VLDL 29 05/22/2016 1407   LDLCALC 21 12/09/2016 1151      Wt Readings from Last 3 Encounters:  09/30/20 192 lb (87.1 kg)  05/31/20 186 lb 6.4 oz (84.6 kg)  05/17/20 177 lb (80.3 kg)      ASSESSMENT AND PLAN:  1  PAF  Pt had about 20% burden of afib   Metoprolol was increased  She is feeling OK    Continue coumadin    Check labs     2  Hx HTN  BP is controlled  3  HL Contnue Crestor    F/u next winter     Current medicines are reviewed at length with the patient today.  The patient does not have concerns regarding medicines.  Signed, Nevin Bloodgood  Harrington Challenger, MD  09/30/2020 1:40 PM    [facility] [address], [place], [place]  [address] Phone: [phone]; Fax: [phone]

## 2020-10-04 ENCOUNTER — Encounter (INDEPENDENT_AMBULATORY_CARE_PROVIDER_SITE_OTHER): Payer: Medicare Other | Admitting: Ophthalmology

## 2020-10-14 ENCOUNTER — Encounter (INDEPENDENT_AMBULATORY_CARE_PROVIDER_SITE_OTHER): Payer: Medicare Other | Admitting: Ophthalmology

## 2020-10-17 ENCOUNTER — Other Ambulatory Visit: Payer: Self-pay | Admitting: Gastroenterology

## 2020-10-17 ENCOUNTER — Other Ambulatory Visit: Payer: Self-pay

## 2020-10-17 ENCOUNTER — Encounter (INDEPENDENT_AMBULATORY_CARE_PROVIDER_SITE_OTHER): Payer: Medicare Other | Admitting: Ophthalmology

## 2020-10-17 DIAGNOSIS — H353231 Exudative age-related macular degeneration, bilateral, with active choroidal neovascularization: Secondary | ICD-10-CM

## 2020-10-17 DIAGNOSIS — D3132 Benign neoplasm of left choroid: Secondary | ICD-10-CM | POA: Diagnosis not present

## 2020-10-17 DIAGNOSIS — H35033 Hypertensive retinopathy, bilateral: Secondary | ICD-10-CM

## 2020-10-17 DIAGNOSIS — H43813 Vitreous degeneration, bilateral: Secondary | ICD-10-CM

## 2020-10-17 DIAGNOSIS — I10 Essential (primary) hypertension: Secondary | ICD-10-CM | POA: Diagnosis not present

## 2020-10-17 NOTE — Telephone Encounter (Signed)
Patient has not been seen since January 2021.  She needs a follow-up office visit for additional refills after this.  Please arrange office visit.

## 2020-10-21 ENCOUNTER — Encounter: Payer: Self-pay | Admitting: Internal Medicine

## 2020-10-28 ENCOUNTER — Ambulatory Visit (INDEPENDENT_AMBULATORY_CARE_PROVIDER_SITE_OTHER): Payer: Medicare Other | Admitting: *Deleted

## 2020-10-28 DIAGNOSIS — Z5181 Encounter for therapeutic drug level monitoring: Secondary | ICD-10-CM

## 2020-10-28 DIAGNOSIS — I4891 Unspecified atrial fibrillation: Secondary | ICD-10-CM | POA: Diagnosis not present

## 2020-10-28 LAB — POCT INR: INR: 1.6 — AB (ref 2.0–3.0)

## 2020-10-28 NOTE — Patient Instructions (Signed)
Take warfarin 2 tablets tonight and tomorrow night then resume 1 1/2 tablets daily except 1 tablet on Tuesdays and Fridays Recheck in 2 weeks

## 2020-10-30 ENCOUNTER — Other Ambulatory Visit: Payer: Self-pay | Admitting: Internal Medicine

## 2020-10-30 ENCOUNTER — Other Ambulatory Visit (HOSPITAL_COMMUNITY): Payer: Self-pay | Admitting: Internal Medicine

## 2020-10-30 DIAGNOSIS — R188 Other ascites: Secondary | ICD-10-CM

## 2020-11-07 ENCOUNTER — Ambulatory Visit (HOSPITAL_COMMUNITY)
Admission: RE | Admit: 2020-11-07 | Discharge: 2020-11-07 | Disposition: A | Discharge status: Home / Self Care | Payer: Medicare Other | Source: Ambulatory Visit | Attending: Internal Medicine | Admitting: Internal Medicine

## 2020-11-07 ENCOUNTER — Other Ambulatory Visit: Payer: Self-pay

## 2020-11-07 DIAGNOSIS — R188 Other ascites: Secondary | ICD-10-CM | POA: Insufficient documentation

## 2020-11-14 ENCOUNTER — Ambulatory Visit (INDEPENDENT_AMBULATORY_CARE_PROVIDER_SITE_OTHER): Payer: Medicare Other | Admitting: *Deleted

## 2020-11-14 ENCOUNTER — Other Ambulatory Visit: Payer: Self-pay

## 2020-11-14 DIAGNOSIS — Z5181 Encounter for therapeutic drug level monitoring: Secondary | ICD-10-CM | POA: Diagnosis not present

## 2020-11-14 DIAGNOSIS — I4891 Unspecified atrial fibrillation: Secondary | ICD-10-CM

## 2020-11-14 LAB — POCT INR: INR: 2.7 (ref 2.0–3.0)

## 2020-11-14 NOTE — Patient Instructions (Signed)
Continue warfarin 1 1/2 tablets daily except 1 tablet on Tuesdays and Fridays Recheck in 3 weeks

## 2020-11-20 ENCOUNTER — Ambulatory Visit: Payer: Medicare Other | Admitting: Urology

## 2020-11-21 ENCOUNTER — Encounter (INDEPENDENT_AMBULATORY_CARE_PROVIDER_SITE_OTHER): Payer: Medicare Other | Admitting: Ophthalmology

## 2020-11-29 ENCOUNTER — Encounter (INDEPENDENT_AMBULATORY_CARE_PROVIDER_SITE_OTHER): Payer: Medicare Other | Admitting: Ophthalmology

## 2020-11-29 ENCOUNTER — Other Ambulatory Visit: Payer: Self-pay

## 2020-11-29 DIAGNOSIS — I10 Essential (primary) hypertension: Secondary | ICD-10-CM | POA: Diagnosis not present

## 2020-11-29 DIAGNOSIS — D3132 Benign neoplasm of left choroid: Secondary | ICD-10-CM | POA: Diagnosis not present

## 2020-11-29 DIAGNOSIS — H35033 Hypertensive retinopathy, bilateral: Secondary | ICD-10-CM | POA: Diagnosis not present

## 2020-11-29 DIAGNOSIS — H43813 Vitreous degeneration, bilateral: Secondary | ICD-10-CM

## 2020-11-29 DIAGNOSIS — H353231 Exudative age-related macular degeneration, bilateral, with active choroidal neovascularization: Secondary | ICD-10-CM

## 2020-12-10 ENCOUNTER — Ambulatory Visit (INDEPENDENT_AMBULATORY_CARE_PROVIDER_SITE_OTHER): Payer: Medicare Other | Admitting: Internal Medicine

## 2020-12-10 ENCOUNTER — Encounter: Payer: Self-pay | Admitting: Internal Medicine

## 2020-12-10 ENCOUNTER — Other Ambulatory Visit: Payer: Self-pay

## 2020-12-10 VITALS — BP 139/79 | HR 71 | Temp 97.3°F | Ht 63.0 in | Wt 194.4 lb

## 2020-12-10 DIAGNOSIS — R151 Fecal smearing: Secondary | ICD-10-CM

## 2020-12-10 DIAGNOSIS — K2270 Barrett's esophagus without dysplasia: Secondary | ICD-10-CM

## 2020-12-10 DIAGNOSIS — K219 Gastro-esophageal reflux disease without esophagitis: Secondary | ICD-10-CM

## 2020-12-10 NOTE — Patient Instructions (Addendum)
Good to see you again today!  Will plan for a surveillance EGD (history of Barrett's esophagus) in the coming weeks.  ASA 3/propofol. Hold Coumadin for 3 days prior to EGD. Hold Amaryl and metformin the day of EGD  Continue omeprazole 20 mg twice daily  FODMAP diet  Begin Benefiber 1 tablespoon daily for 3 weeks and increase to 2 tablespoons thereafter  Plan for colonoscopy in 2026 for screening purposes  Further recommendations to follow.

## 2020-12-10 NOTE — H&P (View-Only) (Signed)
Primary Care Physician:  Asencion Noble, MD Primary Gastroenterologist:  Dr. Gala Romney  Pre-Procedure History & Physical: HPI:  Adrienne [name] is a 65 y.o. female here for follow-up of GERD/short segment Barrett's esophagus.  Here for setting up a surveillance examination.  She had some upper abdominal bloating /gas discomfort.  Ultrasound gallbladder negative  recently.  Fatty liver.  LFTs previously normal lst year.  GERD symptoms well controlled omeprazole 20 mg twice daily.  Anticoagulated with Coumadin due to atrial fibrillation.  Does complain of some intermittent fecal seepage sometimes she soils her underwear does not know she has had a bowel movement.  Denies diarrhea.  Multiple vaginal deliveries  On Amaryl and metformin for type 2 diabetes mellitus.  Past Medical History:  Diagnosis Date   Allergic rhinitis    Allergy    Anemia    leukocytosis/thrombocytosis--Dr. Tressie Stalker   Anxiety    Arthritis    Asthma    Brain injury    Closed head   Closed head injury 2005   Common migraine with intractable migraine 11/11/6385   Complication of anesthesia    "my blood pressure dropped"   COPD (chronic obstructive pulmonary disease) ([place])    Depression    ? Bipolar Disorder--Dr, Jimmye Norman   Emphysema of lung ([place])    GERD (gastroesophageal reflux disease)    Barrett's esophagus   Headache    migraines   Hx of migraines    Hypertension    Low back pain    Macular degeneration, bilateral    Dry-Dr. Iona Hansen   Memory difficulty 06/08/2019   Neuromuscular disorder (HCC)    NIDDM (non-insulin dependent diabetes mellitus)    OSA (obstructive sleep apnea)    cannot tolerate mask   RLS (restless legs syndrome)    Seizure disorder ([place])    ? absence; due to head truama   Seizures ([place])    Sleep apnea    Status post right hip replacement    Status post rotator cuff surgery    Visual hallucinations 01/17/2019    Past Surgical History:  Procedure Laterality Date   ABDOMINAL  HYSTERECTOMY     heavy bleeding   APPENDECTOMY     BIOPSY  12/27/2014   Procedure: Esophageal BIOPSY;  Surgeon: Daneil Dolin, MD;  Location: [facility];  Service: Endoscopy;;   BIOPSY  02/17/2018   Procedure: BIOPSY;  Surgeon: Daneil Dolin, MD;  [facility];  Service: Endoscopy;;  (gastric)   COLONOSCOPY WITH ESOPHAGOGASTRODUODENOSCOPY (EGD)  12/08   TCS/EGD: + Barrett's,+ gastritis, friable anal canal, hyperplastic polyp   COLONOSCOPY WITH PROPOFOL N/A 12/27/2014   RMR: Internal hemorrhoids likely source of hematochezia. Redundant colon.    ESOPHAGOGASTRODUODENOSCOPY  11/16/2002   Dr.Syncere Eble- salmon-colored epithelium in the upper esophageal sphincter previous bx= ectopic gastric mucosa, salmon colored epithelium at the distal esophagus proviously bx= barretts esophagus o/w the esophageal mucosa appeared normal. patulous esophagogastric junction, small hiatal hernia, gastric mucosa o/w appeared normal. normal D1 and D2   ESOPHAGOGASTRODUODENOSCOPY (EGD) WITH PROPOFOL N/A 12/27/2014   RMR: Abnoraml distal esophagus consistiant with prior diagnosis fo Barretts esophagus- Status post biopsy. Hiatal hernia. Antral erosions status post gastric biopsy,   ESOPHAGOGASTRODUODENOSCOPY (EGD) WITH PROPOFOL N/A 02/17/2018   Procedure: ESOPHAGOGASTRODUODENOSCOPY (EGD) WITH PROPOFOL;  Surgeon: Daneil Dolin, MD;  [facility];  Service: Endoscopy;  Laterality: N/A;  1:30pm   FRACTURE SURGERY Left 03/07/2013   knee   fx left knee Left 2014   INCISION AND  DRAINAGE ABSCESS Right 09/03/2018   Procedure: INCISION AND DRAINAGE RIGHT NECK ABSCESS;  Surgeon: Melida Quitter, MD;  [facility];  Service: ENT;  Laterality: Right;   ORIF TIBIA PLATEAU Left 03/07/2013   Procedure: OPEN REDUCTION INTERNAL FIXATION (ORIF) TIBIAL PLATEAU;  Surgeon: Renette Butters, MD;  Location: [place];  Service: Orthopedics;  Laterality: Left;   REVISION TOTAL HIP ARTHROPLASTY     Right   ROTATOR CUFF REPAIR      Bilaterally   SHOULDER INJECTION Left 03/07/2013   Procedure: SHOULDER INJECTION ; Depomedrol + 0.25% Marcaine mixture 4:1;  Surgeon: Renette Butters, MD;  Location: [place];  Service: Orthopedics;  Laterality: Left;   TONSILLECTOMY     TOTAL HIP ARTHROPLASTY Left 06/01/2016   Procedure: LEFT TOTAL HIP ARTHROPLASTY ANTERIOR APPROACH;  Surgeon: Gaynelle Arabian, MD;  [facility];  Service: Orthopedics;  Laterality: Left;   VESICOVAGINAL FISTULA CLOSURE W/ TAH      Prior to Admission medications   Medication Sig Start Date End Date Taking? Authorizing Provider  acetaminophen (TYLENOL) 500 MG tablet Take 1,000 mg by mouth every 6 (six) hours as needed.   Yes [provider]  ALPRAZolam Duanne Moron) 1 MG tablet Take 1-2 tablets (1-2 mg total) by mouth See admin instructions. Take 1 mg by mouth in the morning, take 1 mg by mouth at 1200, take 1 mg by mouth at 1600 and take 2 mg by mouth at bedtime 09/09/18  Yes Alekh, Kshitiz, MD  B Complex-C (SUPER B COMPLEX PO) Take 1 tablet by mouth daily.    Yes [provider]  BESIVANCE 0.6 % SUSP as needed. 11/09/19  Yes [provider]  Cholecalciferol 25 MCG (1000 UT) capsule Take 1,000 Units by mouth daily.    Yes [provider]  CLINDAMYCIN HCL PO Take by mouth as needed. Only went she gets dental work done   Psychologist, prison and probation services, Historical, MD  cyclobenzaprine (FLEXERIL) 10 MG tablet Take 30 mg by mouth at bedtime.    Yes [provider]  glimepiride (AMARYL) 1 MG tablet Take 1 tablet by mouth daily. 08/15/18  Yes [provider]  hydrochlorothiazide (HYDRODIURIL) 25 MG tablet Take 25 mg by mouth daily as needed. 09/17/20  Yes [provider]  Melatonin 10 MG TABS Take 2 tablets by mouth at bedtime.   Yes [provider]  metFORMIN (GLUCOPHAGE) 1000 MG tablet Take 1 tablet by mouth 2 (two) times a day. 06/09/18  Yes [provider]  methylphenidate (RITALIN) 20 MG tablet Take 20 mg by mouth  3 (three) times daily with meals.    Yes [provider]  metoprolol tartrate (LOPRESSOR) 50 MG tablet Take 1 tablet (50 mg total) by mouth 2 (two) times daily. 07/15/20 12/10/20 Yes Fay Records, MD  mirabegron ER (MYRBETRIQ) 25 MG TB24 tablet Take by mouth. Take 1/2 tab every other day.   Yes [provider]  omeprazole (PRILOSEC) 20 MG capsule Take 1 capsule by mouth twice daily 10/17/20  Yes Jodi Mourning, Kristen S, PA-C  ondansetron (ZOFRAN) 4 MG tablet Take 1 tablet (4 mg total) by mouth every 8 (eight) hours as needed for nausea or vomiting. 01/22/19  Yes Rolland Porter, MD  prazosin (MINIPRESS) 1 MG capsule Take 1 mg by mouth at bedtime.   Yes [provider]  rosuvastatin (CRESTOR) 20 MG tablet TAKE (1) TABLET BY MOUTH ONCE DAILY. Patient taking differently: Take 20 mg by mouth daily. 02/11/17  Yes Raylene Everts,  MD  traZODone (DESYREL) 50 MG tablet Take 50 mg by mouth at bedtime. 09/30/19  Yes [provider]  venlafaxine XR (EFFEXOR-XR) 150 MG 24 hr capsule Take 150 mg by mouth daily with breakfast.   Yes [provider]  warfarin (COUMADIN) 5 MG tablet Take 1 1/2 tablets daily except 1 tablet on Tuesdays and Fridays or as directed 08/22/20  Yes Fay Records, MD    Allergies as of 12/10/2020 - Review Complete 05/31/2020  Allergen Reaction Noted   Penicillins Rash, Other (See Comments), and Itching 05/14/2014   Sulfa antibiotics Itching and Rash 05/14/2014   Hydromorphone  11/14/2019   Morphine  05/31/2020   Codeine Other (See Comments)    Dilaudid [hydromorphone hcl] Itching 05/23/2013   Morphine and related Itching and Other (See Comments) 03/02/2013   Sulfonamide derivatives Rash     Family History  Problem Relation Age of Onset   Heart disease Father 26   Atrial fibrillation Mother    Cancer Mother        carcinoid   Diabetes Maternal Grandfather    Cancer Maternal Grandfather    Heart disease Maternal Grandmother    Thyroid disease  Son     Social History   Socioeconomic History   Marital status: Divorced    Spouse name: Not on file   Number of children: 3   Years of education: 14   Highest education level: Not on file  Occupational History   Occupation: on disability    Employer: UNEMPLOYED    Comment: due to head injury, 2005  Tobacco Use   Smoking status: Some Days    Packs/day: 1.00    Years: 49.00    Pack years: 49.00    Types: Cigarettes    Start date: 03/03/1971   Smokeless tobacco: Never  Vaping Use   Vaping Use: Never used  Substance and Sexual Activity   Alcohol use: No   Drug use: No   Sexual activity: Yes    Birth control/protection: Surgical  Other Topics Concern   Not on file  Social History Narrative   Right handed   Caffeine use: soda    Lives alone   Two big dogs   Enjoys yard work, home improvement   Social Determinants of Health   Financial Resource Strain: Not on file  Food Insecurity: Not on file  Transportation Needs: Not on file  Physical Activity: Not on file  Stress: Not on file  Social Connections: Not on file  Intimate Partner Violence: Not on file    Review of Systems: See HPI, otherwise negative ROS  Physical Exam: BP 139/79   Pulse 71   Temp (!) 97.3 F (36.3 C) (Temporal)   Ht $R'5\' 3"'nA$  (1.6 m)   Wt 194 lb 6.4 oz (88.2 kg)   BMI 34.44 kg/m  General:   Alert,  Well-developed, well-nourished, pleasant and cooperative in NAD Neck:  Supple; no masses or thyromegaly. No significant cervical adenopathy. Lungs:  Clear throughout to auscultation.   No wheezes, crackles, or rhonchi. No acute distress. Heart:  Regular rate and rhythm; no murmurs, clicks, rubs,  or gallops. Abdomen: Non-distended, normal bowel sounds.  Soft and nontender without appreciable mass or hepatosplenomegaly.  Pulses:  Normal pulses noted. Extremities:  Without clubbing or edema.  Impression/Plan: 65 year old lady with longstanding GERD/short segment Barrett's esophagus-reflux  symptoms well controlled on twice daily omeprazole.  Due for surveillance EGD at this time.  Intermittent gas bloat symptoms without any alarm features.  Gallbladder ultrasound negative (fatty liver).  Normal LFTs  Fecal seepage from time to time.  Likely multifactorial in etiology.  Likely a result of prior childbirth, diabetes,?  Medication effect  ? metformin)  Recommendations:   Will plan for a surveillance EGD (history of Barrett's esophagus) in the coming weeks.  ASA 3/propofol. Hold Coumadin for 3 days prior to EGD. Hold Amaryl and metformin the day of EGD  Continue omeprazole 20 mg twice daily  FODMAP diet  Begin Benefiber 1 tablespoon daily for 3 weeks and increase to 2 tablespoons thereafter  Plan for colonoscopy in 2026 for screening purposes  Further recommendations to follow.   Notice: This dictation was prepared with Dragon dictation along with smaller phrase technology. Any transcriptional errors that result from this process are unintentional and may not be corrected upon review.

## 2020-12-10 NOTE — Progress Notes (Signed)
Primary Care Physician:  Carylon Perches, MD Primary Gastroenterologist:  Dr. Jena Gauss  Pre-Procedure History & Physical: HPI:  Adrienne [name] is a 65 y.o. female here for follow-up of GERD/short segment Barrett's esophagus.  Here for setting up a surveillance examination.  She had some upper abdominal bloating /gas discomfort.  Ultrasound gallbladder negative  recently.  Fatty liver.  LFTs previously normal lst year.  GERD symptoms well controlled omeprazole 20 mg twice daily.  Anticoagulated with Coumadin due to atrial fibrillation.  Does complain of some intermittent fecal seepage sometimes she soils her underwear does not know she has had a bowel movement.  Denies diarrhea.  Multiple vaginal deliveries  On Amaryl and metformin for type 2 diabetes mellitus.  Past Medical History:  Diagnosis Date   Allergic rhinitis    Allergy    Anemia    leukocytosis/thrombocytosis--Dr. Mariel Sleet   Anxiety    Arthritis    Asthma    Brain injury    Closed head   Closed head injury 2005   Common migraine with intractable migraine 06/08/2019   Complication of anesthesia    "my blood pressure dropped"   COPD (chronic obstructive pulmonary disease) (H[facility])    Depression    ? Bipolar Disorder--Dr, Mayford Knife   Emphysema of lung ([facility])    GERD (gastroesophageal reflux disease)    Barrett's esophagus   Headache    migraines   Hx of migraines    Hypertension    Low back pain    Macular degeneration, bilateral    Dry-Dr. Lita Mains   Memory difficulty 06/08/2019   Neuromuscular disorder (HCC)    NIDDM (non-insulin dependent diabetes mellitus)    OSA (obstructive sleep apnea)    cannot tolerate mask   RLS (restless legs syndrome)    Seizure disorder ([facility])    ? absence; due to head truama   Seizures (HCC)    Sleep apnea    Status post right hip replacement    Status post rotator cuff surgery    Visual hallucinations 01/17/2019    Past Surgical History:  Procedure Laterality Date   ABDOMINAL  HYSTERECTOMY     heavy bleeding   APPENDECTOMY     BIOPSY  12/27/2014   Procedure: Esophageal BIOPSY;  Surgeon: Corbin Ade, MD;  [facility];  Service: Endoscopy;;   BIOPSY  02/17/2018   Procedure: BIOPSY;  Surgeon: Corbin Ade, MD;  [facility];  Service: Endoscopy;;  (gastric)   COLONOSCOPY WITH ESOPHAGOGASTRODUODENOSCOPY (EGD)  12/08   TCS/EGD: + Barrett's,+ gastritis, friable anal canal, hyperplastic polyp   COLONOSCOPY WITH PROPOFOL N/A 12/27/2014   RMR: Internal hemorrhoids likely source of hematochezia. Redundant colon.    ESOPHAGOGASTRODUODENOSCOPY  11/16/2002   Dr.Vala Raffo- salmon-colored epithelium in the upper esophageal sphincter previous bx= ectopic gastric mucosa, salmon colored epithelium at the distal esophagus proviously bx= barretts esophagus o/w the esophageal mucosa appeared normal. patulous esophagogastric junction, small hiatal hernia, gastric mucosa o/w appeared normal. normal D1 and D2   ESOPHAGOGASTRODUODENOSCOPY (EGD) WITH PROPOFOL N/A 12/27/2014   RMR: Abnoraml distal esophagus consistiant with prior diagnosis fo Barretts esophagus- Status post biopsy. Hiatal hernia. Antral erosions status post gastric biopsy,   ESOPHAGOGASTRODUODENOSCOPY (EGD) WITH PROPOFOL N/A 02/17/2018   Procedure: ESOPHAGOGASTRODUODENOSCOPY (EGD) WITH PROPOFOL;  Surgeon: Corbin Ade, MD;  [facility];  Service: Endoscopy;  Laterality: N/A;  1:30pm   FRACTURE SURGERY Left 03/07/2013   knee   fx left knee Left 2014   INCISION AND  DRAINAGE ABSCESS Right 09/03/2018   Procedure: INCISION AND DRAINAGE RIGHT NECK ABSCESS;  Surgeon: Melida Quitter, MD;  Location[facility];  Service: ENT;  Laterality: Right;   ORIF TIBIA PLATEAU Left 03/07/2013   Procedure: OPEN REDUCTION INTERNAL FIXATION (ORIF) TIBIAL PLATEAU;  Surgeon: Renette Butters, MD;  Location: [place];  Service: Orthopedics;  Laterality: Left;   REVISION TOTAL HIP ARTHROPLASTY     Right   ROTATOR CUFF REPAIR      Bilaterally   SHOULDER INJECTION Left 03/07/2013   Procedure: SHOULDER INJECTION ; Depomedrol + 0.25% Marcaine mixture 4:1;  Surgeon: Renette Butters, MD;  Location: [place];  Service: Orthopedics;  Laterality: Left;   TONSILLECTOMY     TOTAL HIP ARTHROPLASTY Left 06/01/2016   Procedure: LEFT TOTAL HIP ARTHROPLASTY ANTERIOR APPROACH;  Surgeon: Gaynelle Arabian, MD;  [facility];  Service: Orthopedics;  Laterality: Left;   VESICOVAGINAL FISTULA CLOSURE W/ TAH      Prior to Admission medications   Medication Sig Start Date End Date Taking? Authorizing Provider  acetaminophen (TYLENOL) 500 MG tablet Take 1,000 mg by mouth every 6 (six) hours as needed.   Yes [provider]  ALPRAZolam Duanne Moron) 1 MG tablet Take 1-2 tablets (1-2 mg total) by mouth See admin instructions. Take 1 mg by mouth in the morning, take 1 mg by mouth at 1200, take 1 mg by mouth at 1600 and take 2 mg by mouth at bedtime 09/09/18  Yes Alekh, Kshitiz, MD  B Complex-C (SUPER B COMPLEX PO) Take 1 tablet by mouth daily.    Yes [provider]  BESIVANCE 0.6 % SUSP as needed. 11/09/19  Yes [provider]  Cholecalciferol 25 MCG (1000 UT) capsule Take 1,000 Units by mouth daily.    Yes [provider]  CLINDAMYCIN HCL PO Take by mouth as needed. Only went she gets dental work done   Psychologist, prison and probation services, Historical, MD  cyclobenzaprine (FLEXERIL) 10 MG tablet Take 30 mg by mouth at bedtime.    Yes [provider]  glimepiride (AMARYL) 1 MG tablet Take 1 tablet by mouth daily. 08/15/18  Yes [provider]  hydrochlorothiazide (HYDRODIURIL) 25 MG tablet Take 25 mg by mouth daily as needed. 09/17/20  Yes [provider]  Melatonin 10 MG TABS Take 2 tablets by mouth at bedtime.   Yes [provider]  metFORMIN (GLUCOPHAGE) 1000 MG tablet Take 1 tablet by mouth 2 (two) times a day. 06/09/18  Yes [provider]  methylphenidate (RITALIN) 20 MG tablet Take 20 mg by mouth  3 (three) times daily with meals.    Yes [provider]  metoprolol tartrate (LOPRESSOR) 50 MG tablet Take 1 tablet (50 mg total) by mouth 2 (two) times daily. 07/15/20 12/10/20 Yes Fay Records, MD  mirabegron ER (MYRBETRIQ) 25 MG TB24 tablet Take by mouth. Take 1/2 tab every other day.   Yes [provider]  omeprazole (PRILOSEC) 20 MG capsule Take 1 capsule by mouth twice daily 10/17/20  Yes Jodi Mourning, Kristen S, PA-C  ondansetron (ZOFRAN) 4 MG tablet Take 1 tablet (4 mg total) by mouth every 8 (eight) hours as needed for nausea or vomiting. 01/22/19  Yes Rolland Porter, MD  prazosin (MINIPRESS) 1 MG capsule Take 1 mg by mouth at bedtime.   Yes [provider]  rosuvastatin (CRESTOR) 20 MG tablet TAKE (1) TABLET BY MOUTH ONCE DAILY. Patient taking differently: Take 20 mg by mouth daily. 02/11/17  Yes Raylene Everts,  MD  traZODone (DESYREL) 50 MG tablet Take 50 mg by mouth at bedtime. 09/30/19  Yes [provider]  venlafaxine XR (EFFEXOR-XR) 150 MG 24 hr capsule Take 150 mg by mouth daily with breakfast.   Yes [provider]  warfarin (COUMADIN) 5 MG tablet Take 1 1/2 tablets daily except 1 tablet on Tuesdays and Fridays or as directed 08/22/20  Yes Fay Records, MD    Allergies as of 12/10/2020 - Review Complete 05/31/2020  Allergen Reaction Noted   Penicillins Rash, Other (See Comments), and Itching 05/14/2014   Sulfa antibiotics Itching and Rash 05/14/2014   Hydromorphone  11/14/2019   Morphine  05/31/2020   Codeine Other (See Comments)    Dilaudid [hydromorphone hcl] Itching 05/23/2013   Morphine and related Itching and Other (See Comments) 03/02/2013   Sulfonamide derivatives Rash     Family History  Problem Relation Age of Onset   Heart disease Father 6   Atrial fibrillation Mother    Cancer Mother        carcinoid   Diabetes Maternal Grandfather    Cancer Maternal Grandfather    Heart disease Maternal Grandmother    Thyroid disease  Son     Social History   Socioeconomic History   Marital status: Divorced    Spouse name: Not on file   Number of children: 3   Years of education: 14   Highest education level: Not on file  Occupational History   Occupation: on disability    Employer: UNEMPLOYED    Comment: due to head injury, 2005  Tobacco Use   Smoking status: Some Days    Packs/day: 1.00    Years: 49.00    Pack years: 49.00    Types: Cigarettes    Start date: 03/03/1971   Smokeless tobacco: Never  Vaping Use   Vaping Use: Never used  Substance and Sexual Activity   Alcohol use: No   Drug use: No   Sexual activity: Yes    Birth control/protection: Surgical  Other Topics Concern   Not on file  Social History Narrative   Right handed   Caffeine use: soda    Lives alone   Two big dogs   Enjoys yard work, home improvement   Social Determinants of Health   Financial Resource Strain: Not on file  Food Insecurity: Not on file  Transportation Needs: Not on file  Physical Activity: Not on file  Stress: Not on file  Social Connections: Not on file  Intimate Partner Violence: Not on file    Review of Systems: See HPI, otherwise negative ROS  Physical Exam: BP 139/79   Pulse 71   Temp (!) 97.3 F (36.3 C) (Temporal)   Ht $R'5\' 3"'Vp$  (1.6 m)   Wt 194 lb 6.4 oz (88.2 kg)   BMI 34.44 kg/m  General:   Alert,  Well-developed, well-nourished, pleasant and cooperative in NAD Neck:  Supple; no masses or thyromegaly. No significant cervical adenopathy. Lungs:  Clear throughout to auscultation.   No wheezes, crackles, or rhonchi. No acute distress. Heart:  Regular rate and rhythm; no murmurs, clicks, rubs,  or gallops. Abdomen: Non-distended, normal bowel sounds.  Soft and nontender without appreciable mass or hepatosplenomegaly.  Pulses:  Normal pulses noted. Extremities:  Without clubbing or edema.  Impression/Plan: 65 year old lady with longstanding GERD/short segment Barrett's esophagus-reflux  symptoms well controlled on twice daily omeprazole.  Due for surveillance EGD at this time.  Intermittent gas bloat symptoms without any alarm features.  Gallbladder ultrasound negative (fatty liver).  Normal LFTs  Fecal seepage from time to time.  Likely multifactorial in etiology.  Likely a result of prior childbirth, diabetes,?  Medication effect  ? metformin)  Recommendations:   Will plan for a surveillance EGD (history of Barrett's esophagus) in the coming weeks.  ASA 3/propofol. Hold Coumadin for 3 days prior to EGD. Hold Amaryl and metformin the day of EGD  Continue omeprazole 20 mg twice daily  FODMAP diet  Begin Benefiber 1 tablespoon daily for 3 weeks and increase to 2 tablespoons thereafter  Plan for colonoscopy in 2026 for screening purposes  Further recommendations to follow.   Notice: This dictation was prepared with Dragon dictation along with smaller phrase technology. Any transcriptional errors that result from this process are unintentional and may not be corrected upon review.

## 2020-12-11 ENCOUNTER — Ambulatory Visit (INDEPENDENT_AMBULATORY_CARE_PROVIDER_SITE_OTHER): Payer: Medicare Other | Admitting: *Deleted

## 2020-12-11 DIAGNOSIS — Z5181 Encounter for therapeutic drug level monitoring: Secondary | ICD-10-CM | POA: Diagnosis not present

## 2020-12-11 DIAGNOSIS — I4891 Unspecified atrial fibrillation: Secondary | ICD-10-CM | POA: Diagnosis not present

## 2020-12-11 LAB — POCT INR: INR: 3.6 — AB (ref 2.0–3.0)

## 2020-12-11 NOTE — Patient Instructions (Signed)
Hold warfarin tonight then resume 1 1/2 tablets daily except 1 tablet on Tuesdays and Fridays Restart Vit k foods 2 x week Recheck in 3 weeks

## 2020-12-16 ENCOUNTER — Ambulatory Visit: Payer: Medicare Other | Admitting: Urology

## 2020-12-19 ENCOUNTER — Telehealth: Payer: Self-pay

## 2020-12-19 NOTE — Telephone Encounter (Signed)
Pt's sister/caregiver Jackelyn Poling) called office, EGD scheduled for 01/09/21 at 12:45pm. Requested afternoon appt d/t hard to get pt up in mornings. Debbie requested for instructions to be mailed to her, [place], [place], [place]. Orders entered.

## 2020-12-19 NOTE — Telephone Encounter (Signed)
Tried to call pt to schedule EGD w/Propofol ASA 3 w/Dr. Gala Romney, [facility] for return call.

## 2020-12-20 NOTE — Telephone Encounter (Signed)
Pre-op appt 01/06/21. Appt letter mailed with procedure instructions.

## 2020-12-27 ENCOUNTER — Encounter: Payer: Self-pay | Admitting: Urology

## 2020-12-27 ENCOUNTER — Encounter (INDEPENDENT_AMBULATORY_CARE_PROVIDER_SITE_OTHER): Payer: Medicare Other | Admitting: Ophthalmology

## 2020-12-27 ENCOUNTER — Other Ambulatory Visit: Payer: Self-pay

## 2020-12-27 ENCOUNTER — Ambulatory Visit (INDEPENDENT_AMBULATORY_CARE_PROVIDER_SITE_OTHER): Payer: Medicare Other | Admitting: Urology

## 2020-12-27 VITALS — BP 108/74 | HR 73 | Temp 98.7°F | Wt 190.0 lb

## 2020-12-27 DIAGNOSIS — H353231 Exudative age-related macular degeneration, bilateral, with active choroidal neovascularization: Secondary | ICD-10-CM | POA: Diagnosis not present

## 2020-12-27 DIAGNOSIS — H43813 Vitreous degeneration, bilateral: Secondary | ICD-10-CM

## 2020-12-27 DIAGNOSIS — R32 Unspecified urinary incontinence: Secondary | ICD-10-CM | POA: Diagnosis not present

## 2020-12-27 DIAGNOSIS — I10 Essential (primary) hypertension: Secondary | ICD-10-CM

## 2020-12-27 DIAGNOSIS — N898 Other specified noninflammatory disorders of vagina: Secondary | ICD-10-CM | POA: Diagnosis not present

## 2020-12-27 DIAGNOSIS — H35033 Hypertensive retinopathy, bilateral: Secondary | ICD-10-CM | POA: Diagnosis not present

## 2020-12-27 DIAGNOSIS — D3132 Benign neoplasm of left choroid: Secondary | ICD-10-CM

## 2020-12-27 MED ORDER — MIRABEGRON ER 25 MG PO TB24: 25.0000 mg | ORAL_TABLET | Freq: Every day | ORAL | 3 refills | Status: DC

## 2020-12-27 NOTE — Patient Instructions (Signed)

## 2020-12-27 NOTE — Progress Notes (Signed)
12/27/2020 8:58 AM   Adrienne [name] [birth year] [id number]  Referring provider: Asencion Noble, MD [address] [place],  [place] [address]  Followup OAB   HPI: Adrienne [name] is a [phone] here for followup for OAB. He has resolution of her urinary urgency and urge incontinence with mirabegron 25mg  QOD. She is very happy with her urination. For the past 2 weeks she has had bloody vaginal discharge. She denies any pelvic pain. No worsening LUTS. She has a hx of hysterectomy.    PMH: Past Medical History:  Diagnosis Date   Allergic rhinitis    Allergy    Anemia    leukocytosis/thrombocytosis--Dr. Tressie Stalker   Anxiety    Arthritis    Asthma    Brain injury    Closed head   Closed head injury 2005   Common migraine with intractable migraine 05/10/1960   Complication of anesthesia    "my blood pressure dropped"   COPD (chronic obstructive pulmonary disease) ([place])    Depression    ? Bipolar Disorder--Dr, Jimmye Norman   Emphysema of lung ([place])    GERD (gastroesophageal reflux disease)    Barrett's esophagus   Headache    migraines   Hx of migraines    Hypertension    Low back pain    Macular degeneration, bilateral    Dry-Dr. Iona Hansen   Memory difficulty 06/08/2019   Neuromuscular disorder (H[facility])    NIDDM (non-insulin dependent diabetes mellitus)    OSA (obstructive sleep apnea)    cannot tolerate mask   RLS (restless legs syndrome)    Seizure disorder ([place])    ? absence; due to head truama   Seizures ([place])    Sleep apnea    Status post right hip replacement    Status post rotator cuff surgery    Visual hallucinations 01/17/2019    Surgical History: Past Surgical History:  Procedure Laterality Date   ABDOMINAL HYSTERECTOMY     heavy bleeding   APPENDECTOMY     BIOPSY  12/27/2014   Procedure: Esophageal BIOPSY;  Surgeon: Daneil Dolin, MD;  Location: [facility];  Service: Endoscopy;;   BIOPSY  02/17/2018   Procedure: BIOPSY;  Surgeon: Daneil Dolin, MD;  [facility];  Service: Endoscopy;;  (gastric)   COLONOSCOPY WITH ESOPHAGOGASTRODUODENOSCOPY (EGD)  12/08   TCS/EGD: + Barrett's,+ gastritis, friable anal canal, hyperplastic polyp   COLONOSCOPY WITH PROPOFOL N/A 12/27/2014   RMR: Internal hemorrhoids likely source of hematochezia. Redundant colon.    ESOPHAGOGASTRODUODENOSCOPY  11/16/2002   Dr.Rourk- salmon-colored epithelium in the upper esophageal sphincter previous bx= ectopic gastric mucosa, salmon colored epithelium at the distal esophagus proviously bx= barretts esophagus o/w the esophageal mucosa appeared normal. patulous esophagogastric junction, small hiatal hernia, gastric mucosa o/w appeared normal. normal D1 and D2   ESOPHAGOGASTRODUODENOSCOPY (EGD) WITH PROPOFOL N/A 12/27/2014   RMR: Abnoraml distal esophagus consistiant with prior diagnosis fo Barretts esophagus- Status post biopsy. Hiatal hernia. Antral erosions status post gastric biopsy,   ESOPHAGOGASTRODUODENOSCOPY (EGD) WITH PROPOFOL N/A 02/17/2018   Procedure: ESOPHAGOGASTRODUODENOSCOPY (EGD) WITH PROPOFOL;  Surgeon: Daneil Dolin, MD;  [facility];  Service: Endoscopy;  Laterality: N/A;  1:30pm   FRACTURE SURGERY Left 03/07/2013   knee   fx left knee Left 2014   INCISION AND DRAINAGE ABSCESS Right 09/03/2018   Procedure: INCISION AND DRAINAGE RIGHT NECK ABSCESS;  Surgeon: Melida Quitter, MD;  Location: [facility];  Service: ENT;  Laterality: Right;   ORIF TIBIA PLATEAU Left  03/07/2013   Procedure: OPEN REDUCTION INTERNAL FIXATION (ORIF) TIBIAL PLATEAU;  Surgeon: Renette Butters, MD;  Location: [facility];  Service: Orthopedics;  Laterality: Left;   REVISION TOTAL HIP ARTHROPLASTY     Right   ROTATOR CUFF REPAIR     Bilaterally   SHOULDER INJECTION Left 03/07/2013   Procedure: SHOULDER INJECTION ; Depomedrol + 0.25% Marcaine mixture 4:1;  Surgeon: Renette Butters, MD;  Location: [facility];  Service: Orthopedics;  Laterality: Left;   TONSILLECTOMY     TOTAL HIP ARTHROPLASTY Left  06/01/2016   Procedure: LEFT TOTAL HIP ARTHROPLASTY ANTERIOR APPROACH;  Surgeon: Gaynelle Arabian, MD;  Location: [facility];  Service: Orthopedics;  Laterality: Left;   VESICOVAGINAL FISTULA CLOSURE W/ TAH      Home Medications:  Allergies as of 12/27/2020       Reactions   Penicillins Rash, Other (See Comments), Itching   Per patient just thrush Has patient had a PCN reaction causing immediate rash, facial/tongue/throat swelling, SOB or lightheadedness with hypotension: Yes Has patient had a PCN reaction causing severe rash involving mucus membranes or skin necrosis: Yes Has patient had a PCN reaction that required hospitalization No Has patient had a PCN reaction occurring within the last 10 years: No If all of the above answers are "NO", then may proceed with Cephalosporin use.   Sulfa Antibiotics Itching, Rash   Hydromorphone    Morphine    Codeine Other (See Comments)   REACTION: headaches   Dilaudid [hydromorphone Hcl] Itching   Morphine And Related Itching, Other (See Comments)   Side effect   Sulfonamide Derivatives Rash        Medication List        Accurate as of December 27, 2020  8:58 AM. If you have any questions, ask your nurse or doctor.          acetaminophen 500 MG tablet Commonly known as: TYLENOL Take 1,000 mg by mouth every 6 (six) hours as needed.   ALPRAZolam 1 MG tablet Commonly known as: XANAX Take 1-2 tablets (1-2 mg total) by mouth See admin instructions. Take 1 mg by mouth in the morning, take 1 mg by mouth at 1200, take 1 mg by mouth at 1600 and take 2 mg by mouth at bedtime   Besivance 0.6 % Susp Generic drug: Besifloxacin HCl as needed.   Cholecalciferol 25 MCG (1000 UT) capsule Take 1,000 Units by mouth daily.   CLINDAMYCIN HCL PO Take by mouth as needed. Only went she gets dental work done   cyclobenzaprine 10 MG tablet Commonly known as: FLEXERIL Take 30 mg by mouth at bedtime.   glimepiride 1 MG tablet Commonly known as:  AMARYL Take 1 tablet by mouth daily.   hydrochlorothiazide 25 MG tablet Commonly known as: HYDRODIURIL Take 25 mg by mouth daily as needed.   Melatonin 10 MG Tabs Take 2 tablets by mouth at bedtime.   metFORMIN 1000 MG tablet Commonly known as: GLUCOPHAGE Take 1 tablet by mouth 2 (two) times a day.   methylphenidate 20 MG tablet Commonly known as: RITALIN Take 20 mg by mouth 3 (three) times daily with meals.   metoprolol tartrate 50 MG tablet Commonly known as: LOPRESSOR Take 1 tablet (50 mg total) by mouth 2 (two) times daily.   mirabegron ER 25 MG Tb24 tablet Commonly known as: MYRBETRIQ Take by mouth. Take 1/2 tab every other day.   omeprazole 20 MG capsule Commonly known as: PRILOSEC Take 1 capsule by mouth twice  daily   ondansetron 4 MG tablet Commonly known as: ZOFRAN Take 1 tablet (4 mg total) by mouth every 8 (eight) hours as needed for nausea or vomiting.   prazosin 1 MG capsule Commonly known as: MINIPRESS Take 1 mg by mouth at bedtime.   rosuvastatin 20 MG tablet Commonly known as: CRESTOR TAKE (1) TABLET BY MOUTH ONCE DAILY. What changed: See the new instructions.   SUPER B COMPLEX PO Take 1 tablet by mouth daily.   traZODone 50 MG tablet Commonly known as: DESYREL Take 50 mg by mouth at bedtime.   venlafaxine XR 150 MG 24 hr capsule Commonly known as: EFFEXOR-XR Take 150 mg by mouth daily with breakfast.   warfarin 5 MG tablet Commonly known as: COUMADIN Take as directed by the anticoagulation clinic. If you are unsure how to take this medication, talk to your nurse or doctor. Original instructions: Take 1 1/2 tablets daily except 1 tablet on Tuesdays and Fridays or as directed        Allergies:  Allergies  Allergen Reactions   Penicillins Rash, Other (See Comments) and Itching    Per patient just thrush Has patient had a PCN reaction causing immediate rash, facial/tongue/throat swelling, SOB or lightheadedness with hypotension:  Yes Has patient had a PCN reaction causing severe rash involving mucus membranes or skin necrosis: Yes Has patient had a PCN reaction that required hospitalization No Has patient had a PCN reaction occurring within the last 10 years: No If all of the above answers are "NO", then may proceed with Cephalosporin use.    Sulfa Antibiotics Itching and Rash   Hydromorphone    Morphine    Codeine Other (See Comments)    REACTION: headaches   Dilaudid [Hydromorphone Hcl] Itching   Morphine And Related Itching and Other (See Comments)    Side effect   Sulfonamide Derivatives Rash    Family History: Family History  Problem Relation Age of Onset   Heart disease Father 85   Atrial fibrillation Mother    Cancer Mother        carcinoid   Diabetes Maternal Grandfather    Cancer Maternal Grandfather    Heart disease Maternal Grandmother    Thyroid disease Son     Social History:  reports that she has been smoking cigarettes. She started smoking about 49 years ago. She has a 49.00 pack-year smoking history. She has never used smokeless tobacco. She reports that she does not drink alcohol and does not use drugs.  ROS: All other review of systems were reviewed and are negative except what is noted above in HPI  Physical Exam: BP 108/74   Pulse 73   Temp 98.7 F (37.1 C)   Wt 190 lb (86.2 kg)   BMI 33.66 kg/m   Constitutional:  Alert and oriented, No acute distress. HEENT: [place] AT, moist mucus membranes.  Trachea midline, no masses. Cardiovascular: No clubbing, cyanosis, or edema. Respiratory: Normal respiratory effort, no increased work of breathing. GI: Abdomen is soft, nontender, nondistended, no abdominal masses GU: No CVA tenderness.  Lymph: No cervical or inguinal lymphadenopathy. Skin: No rashes, bruises or suspicious lesions. Neurologic: Grossly intact, no focal deficits, moving all 4 extremities. Psychiatric: Normal mood and affect.  Laboratory Data: Lab Results  Component  Value Date   WBC 7.4 09/30/2020   HGB 13.8 09/30/2020   HCT 41.9 09/30/2020   MCV 96.5 09/30/2020   PLT 248 09/30/2020    Lab Results  Component Value Date  CREATININE 1.15 (H) 09/30/2020    No results found for: PSA  No results found for: TESTOSTERONE  Lab Results  Component Value Date   HGBA1C 8.8 (H) 09/02/2018    Urinalysis    Component Value Date/Time   COLORURINE YELLOW 09/02/2018 1151   APPEARANCEUR Hazy (A) 05/17/2020 1440   LABSPEC >1.046 (H) 09/02/2018 1151   PHURINE 5.0 09/02/2018 1151   GLUCOSEU Negative 05/17/2020 1440   HGBUR SMALL (A) 09/02/2018 1151   BILIRUBINUR Negative 05/17/2020 1440   KETONESUR NEGATIVE 09/02/2018 1151   PROTEINUR Negative 05/17/2020 1440   PROTEINUR 30 (A) 09/02/2018 1151   UROBILINOGEN 0.2 12/16/2007 1404   NITRITE Negative 05/17/2020 1440   NITRITE NEGATIVE 09/02/2018 1151   LEUKOCYTESUR Negative 05/17/2020 1440   LEUKOCYTESUR NEGATIVE 09/02/2018 1151    Lab Results  Component Value Date   LABMICR Comment 05/17/2020   BACTERIA RARE (A) 09/02/2018    Pertinent Imaging:  No results found for this or any previous visit.  No results found for this or any previous visit.  No results found for this or any previous visit.  No results found for this or any previous visit.  No results found for this or any previous visit.  No results found for this or any previous visit.  No results found for this or any previous visit.  No results found for this or any previous visit.   Assessment & Plan:    1. Urinary incontinence, unspecified type -Continue mirabegron - Urinalysis, Routine w reflex microscopic  2. Vaginal discharge Referral to Gynecology for post menopausal bleeding   No follow-ups on file.  Nicolette Bang, MD  [facility] [place]

## 2020-12-27 NOTE — Progress Notes (Signed)
Urological Symptom Review  Patient is experiencing the following symptoms: Vaginal bleeding (female only)   Review of Systems  Gastrointestinal (upper)  : Negative for upper GI symptoms  Gastrointestinal (lower) : Negative for lower GI symptoms  Constitutional : Negative for symptoms  Skin: Negative for skin symptoms  Eyes: Negative for eye symptoms  Ear/Nose/Throat : Negative for Ear/Nose/Throat symptoms  Hematologic/Lymphatic: Negative for Hematologic/Lymphatic symptoms  Cardiovascular : Negative for cardiovascular symptoms  Respiratory : Negative for respiratory symptoms  Endocrine: Negative for endocrine symptoms  Musculoskeletal: Negative for musculoskeletal symptoms  Neurological: Negative for neurological symptoms  Psychologic: Negative for psychiatric symptoms

## 2020-12-31 ENCOUNTER — Ambulatory Visit (INDEPENDENT_AMBULATORY_CARE_PROVIDER_SITE_OTHER): Payer: Medicare Other | Admitting: *Deleted

## 2020-12-31 ENCOUNTER — Telehealth: Payer: Self-pay | Admitting: Pharmacist

## 2020-12-31 DIAGNOSIS — I4891 Unspecified atrial fibrillation: Secondary | ICD-10-CM

## 2020-12-31 DIAGNOSIS — Z5181 Encounter for therapeutic drug level monitoring: Secondary | ICD-10-CM

## 2020-12-31 LAB — POCT INR: INR: 1.9 — AB (ref 2.0–3.0)

## 2020-12-31 NOTE — Patient Instructions (Signed)
Description   -Today, take 1.5 tablets of warfarin --Normal dose of warfarin 1.5 tablets daily except for 1 tablet on Tuesdays and Fridays. -Hold warfarin 10/31--11/2 -Resume warfarin the night of the procedure 11/3 if okay by the Dr, take an extra 1/2 tablet with your normal dose for 2 dose.

## 2020-12-31 NOTE — Telephone Encounter (Signed)
Pt having INR checked today in [place] office, mentioned she was advised to hold warfarin for 3 days prior to upcoming EGD on 01/09/21. Can see procedure scheduled in Epic, we did not receive clearance request for pt to hold her warfarin though which she takes for afib.  CHA2DS2-VASc Score = 5  This indicates a 7.2% annual risk of stroke. The patient's score is based upon: CHF History: 0 HTN History: 1 Diabetes History: 1 Stroke History: 0 Vascular Disease History: 1 Age Score: 1 Gender Score: 1   12/10/20 office note with Dr Gala Romney mentions he advised pt to hold warfarin for 3 days prior to EGD. This is acceptable based on pt's CV risk profile. Pt is aware.

## 2021-01-01 ENCOUNTER — Other Ambulatory Visit: Payer: Self-pay

## 2021-01-01 DIAGNOSIS — R32 Unspecified urinary incontinence: Secondary | ICD-10-CM

## 2021-01-01 MED ORDER — MIRABEGRON ER 25 MG PO TB24: 25.0000 mg | ORAL_TABLET | ORAL | 3 refills | Status: AC

## 2021-01-03 NOTE — Patient Instructions (Signed)
Adrienne [name]  01/03/2021     @PREFPERIOPPHARMACY @   Your procedure is scheduled on 01/09/2021.   Report to [name] at  45 AM.   Call this number if you have problems the morning of surgery:  [phone]   Remember:  Follow the diet and prep instructions given to you by the office.     Your last dose of coumadin should be 01/05/2021.    DO NOT take any medications for diabetes the morning of your procedure.    Take these medicines the morning of surgery with A SIP OF WATER       xanax (if needed),ritalin, metoprolol, omeprazole, zofran (if needed), effexor.     Do not wear jewelry, make-up or nail polish.  Do not wear lotions, powders, or perfumes, or deodorant.  Do not shave 48 hours prior to surgery.  Men may shave face and neck.  Do not bring valuables to the hospital.  [facility] is not responsible for any belongings or valuables.  Contacts, dentures or bridgework may not be worn into surgery.  Leave your suitcase in the car.  After surgery it may be brought to your room.  For patients admitted to the hospital, discharge time will be determined by your treatment team.  Patients discharged the day of surgery will not be allowed to drive home and must have someone with them for 24 hours.    Special instructions:   DO NOT smoke tobacco or vape for 24 hours before your procedure.  Please read over the following fact sheets that you were given. Anesthesia Post-op Instructions and Care and Recovery After Surgery      Upper Endoscopy, Adult, Care After This sheet gives you information about how to care for yourself after your procedure. Your health care provider may also give you more specific instructions. If you have problems or questions, contact your health care provider. What can I expect after the procedure? After the procedure, it is common to have: A sore throat. Mild stomach pain or discomfort. Bloating. Nausea. Follow these instructions  at home:  Follow instructions from your health care provider about what to eat or drink after your procedure. Return to your normal activities as told by your health care provider. Ask your health care provider what activities are safe for you. Take over-the-counter and prescription medicines only as told by your health care provider. If you were given a sedative during the procedure, it can affect you for several hours. Do not drive or operate machinery until your health care provider says that it is safe. Keep all follow-up visits as told by your health care provider. This is important. Contact a health care provider if you have: A sore throat that lasts longer than one day. Trouble swallowing. Get help right away if: You vomit blood or your vomit looks like coffee grounds. You have: A fever. Bloody, black, or tarry stools. A severe sore throat or you cannot swallow. Difficulty breathing. Severe pain in your chest or abdomen. Summary After the procedure, it is common to have a sore throat, mild stomach discomfort, bloating, and nausea. If you were given a sedative during the procedure, it can affect you for several hours. Do not drive or operate machinery until your health care provider says that it is safe. Follow instructions from your health care provider about what to eat or drink after your procedure. Return to your normal activities as told by your health care provider.  This information is not intended to replace advice given to you by your health care provider. Make sure you discuss any questions you have with your health care provider. Document Revised: 02/21/2019 Document Reviewed: 07/26/2017 Elsevier Patient Education  2022 [place] After This sheet gives you information about how to care for yourself after your procedure. Your health care provider may also give you more specific instructions. If you have problems or questions, contact your  health care provider. What can I expect after the procedure? After the procedure, it is common to have: Tiredness. Forgetfulness about what happened after the procedure. Impaired judgment for important decisions. Nausea or vomiting. Some difficulty with balance. Follow these instructions at home: For the time period you were told by your health care provider:   Rest as needed. Do not participate in activities where you could fall or become injured. Do not drive or use machinery. Do not drink alcohol. Do not take sleeping pills or medicines that cause drowsiness. Do not make important decisions or sign legal documents. Do not take care of children on your own. Eating and drinking Follow the diet that is recommended by your health care provider. Drink enough fluid to keep your urine pale yellow. If you vomit: Drink water, juice, or soup when you can drink without vomiting. Make sure you have little or no nausea before eating solid foods. General instructions Have a responsible adult stay with you for the time you are told. It is important to have someone help care for you until you are awake and alert. Take over-the-counter and prescription medicines only as told by your health care provider. If you have sleep apnea, surgery and certain medicines can increase your risk for breathing problems. Follow instructions from your health care provider about wearing your sleep device: Anytime you are sleeping, including during daytime naps. While taking prescription pain medicines, sleeping medicines, or medicines that make you drowsy. Avoid smoking. Keep all follow-up visits as told by your health care provider. This is important. Contact a health care provider if: You keep feeling nauseous or you keep vomiting. You feel light-headed. You are still sleepy or having trouble with balance after 24 hours. You develop a rash. You have a fever. You have redness or swelling around the IV  site. Get help right away if: You have trouble breathing. You have new-onset confusion at home. Summary For several hours after your procedure, you may feel tired. You may also be forgetful and have poor judgment. Have a responsible adult stay with you for the time you are told. It is important to have someone help care for you until you are awake and alert. Rest as told. Do not drive or operate machinery. Do not drink alcohol or take sleeping pills. Get help right away if you have trouble breathing, or if you suddenly become confused. This information is not intended to replace advice given to you by your health care provider. Make sure you discuss any questions you have with your health care provider. Document Revised: 11/09/2019 Document Reviewed: 01/26/2019 Elsevier Patient Education  2022 [employer].

## 2021-01-06 ENCOUNTER — Other Ambulatory Visit: Payer: Self-pay

## 2021-01-06 ENCOUNTER — Encounter (HOSPITAL_COMMUNITY): Payer: Self-pay

## 2021-01-06 ENCOUNTER — Encounter (HOSPITAL_COMMUNITY)
Admission: RE | Admit: 2021-01-06 | Discharge: 2021-01-06 | Disposition: A | Discharge status: Home / Self Care | Payer: Medicare Other | Source: Ambulatory Visit | Attending: Internal Medicine | Admitting: Internal Medicine

## 2021-01-06 VITALS — BP 136/81 | HR 73 | Temp 98.6°F | Resp 18 | Ht 63.0 in | Wt 190.0 lb

## 2021-01-06 DIAGNOSIS — Z01812 Encounter for preprocedural laboratory examination: Secondary | ICD-10-CM | POA: Insufficient documentation

## 2021-01-06 DIAGNOSIS — E119 Type 2 diabetes mellitus without complications: Secondary | ICD-10-CM | POA: Insufficient documentation

## 2021-01-06 HISTORY — DX: Unspecified atrial fibrillation: I48.91

## 2021-01-06 LAB — BASIC METABOLIC PANEL
Anion gap: 9 (ref 5–15)
BUN: 22 mg/dL (ref 8–23)
CO2: 28 mmol/L (ref 22–32)
Calcium: 9.2 mg/dL (ref 8.9–10.3)
Chloride: 101 mmol/L (ref 98–111)
Creatinine, Ser: 0.98 mg/dL (ref 0.44–1.00)
GFR, Estimated: >60 mL/min (ref >60)
Glucose, Bld: 151 mg/dL — ABNORMAL HIGH (ref 70–99)
Potassium: 4.4 mmol/L (ref 3.5–5.1)
Sodium: 138 mmol/L (ref 135–145)

## 2021-01-07 ENCOUNTER — Other Ambulatory Visit (HOSPITAL_COMMUNITY)
Admission: RE | Admit: 2021-01-07 | Discharge: 2021-01-07 | Disposition: A | Discharge status: Home / Self Care | Payer: Medicare Other | Source: Ambulatory Visit | Attending: Obstetrics & Gynecology | Admitting: Obstetrics & Gynecology

## 2021-01-07 ENCOUNTER — Ambulatory Visit (INDEPENDENT_AMBULATORY_CARE_PROVIDER_SITE_OTHER): Payer: Medicare Other | Admitting: Obstetrics & Gynecology

## 2021-01-07 ENCOUNTER — Encounter: Payer: Self-pay | Admitting: Obstetrics & Gynecology

## 2021-01-07 VITALS — BP 120/82 | HR 76 | Resp 16 | Ht 63.25 in | Wt 193.0 lb

## 2021-01-07 DIAGNOSIS — N950 Postmenopausal bleeding: Secondary | ICD-10-CM | POA: Insufficient documentation

## 2021-01-07 DIAGNOSIS — Z90710 Acquired absence of both cervix and uterus: Secondary | ICD-10-CM

## 2021-01-07 DIAGNOSIS — Z01419 Encounter for gynecological examination (general) (routine) without abnormal findings: Secondary | ICD-10-CM | POA: Insufficient documentation

## 2021-01-07 DIAGNOSIS — N952 Postmenopausal atrophic vaginitis: Secondary | ICD-10-CM | POA: Diagnosis present

## 2021-01-07 NOTE — Progress Notes (Signed)
    Adrienne [name] 1955-12-25 [id number]        65 y.o.  G3P3003 Accompanied by her sister.  RP: [employer] marks on underwear x 1 month  HPI: S/P TAH at age 5 for Menometrorrhagia.  C/O [employer] on underwear x 1 month.  No pelvic pain.  Abstinent.  No Pap test x TAH.  Urine normal on Myrbetriq.  BMs normal.:  Colonoscopy October 2016 which showed internal Hemorrhoids.   OB History  Gravida Para Term Preterm AB Living  3 3 3     3   SAB IAB Ectopic Multiple Live Births               # Outcome Date GA Lbr Len/2nd Weight Sex Delivery Anes PTL Lv  3 Term           2 Term           1 Term             Past medical history,surgical history, problem list, medications, allergies, family history and social history were all reviewed and documented in the EPIC chart.   Directed ROS with pertinent positives and negatives documented in the history of present illness/assessment and plan.  Exam:  Vitals:   01/07/21 1141  BP: 120/82  Pulse: 76  Resp: 16  Weight: 193 lb (87.5 kg)  Height: 5' 3.25" (1.607 m)   General appearance: Normal.  Gynecologic exam: Vulva normal.  Speculum:  Vagina: No lesion or bleeding/blood seen.  Mild erythema at the vaginal cuff.  Pap reflex done.   Assessment/Plan:  65 y.o. G3P3003   1. Postmenopausal bleeding Status post TAH.  Vulva and vagina except for mild erythema of the vaginal cuff.  Pap reflex done.  Colonoscopy October 2016 which showed internal hemorrhoids.  Recommend reinvestigating with gastro as the possible source of brown blood on her underwear. - Cytology - PAP( [place])  2. S/P TAH (total abdominal hysterectomy)  3. Postmenopausal atrophic vaginitis Pap reflex done.  Patient is abstinent.  May use Replens for moisturizing. - [facility]   Princess Bruins MD, 12:14 PM 01/07/2021

## 2021-01-09 ENCOUNTER — Ambulatory Visit (HOSPITAL_COMMUNITY): Payer: Medicare Other | Admitting: Anesthesiology

## 2021-01-09 ENCOUNTER — Other Ambulatory Visit: Payer: Self-pay

## 2021-01-09 ENCOUNTER — Encounter (HOSPITAL_COMMUNITY): Payer: Self-pay | Admitting: Internal Medicine

## 2021-01-09 ENCOUNTER — Ambulatory Visit (HOSPITAL_COMMUNITY)
Admission: RE | Admit: 2021-01-09 | Discharge: 2021-01-09 | Disposition: A | Discharge status: Home / Self Care | Payer: Medicare Other | Attending: Internal Medicine | Admitting: Internal Medicine

## 2021-01-09 ENCOUNTER — Encounter (HOSPITAL_COMMUNITY)
Admission: RE | Disposition: A | Discharge status: Home / Self Care | Payer: Self-pay | Source: Home / Self Care | Attending: Internal Medicine

## 2021-01-09 DIAGNOSIS — K219 Gastro-esophageal reflux disease without esophagitis: Secondary | ICD-10-CM | POA: Diagnosis not present

## 2021-01-09 DIAGNOSIS — F17200 Nicotine dependence, unspecified, uncomplicated: Secondary | ICD-10-CM | POA: Diagnosis not present

## 2021-01-09 DIAGNOSIS — G4733 Obstructive sleep apnea (adult) (pediatric): Secondary | ICD-10-CM | POA: Diagnosis not present

## 2021-01-09 DIAGNOSIS — K317 Polyp of stomach and duodenum: Secondary | ICD-10-CM | POA: Diagnosis not present

## 2021-01-09 DIAGNOSIS — Z79899 Other long term (current) drug therapy: Secondary | ICD-10-CM | POA: Diagnosis not present

## 2021-01-09 DIAGNOSIS — I10 Essential (primary) hypertension: Secondary | ICD-10-CM | POA: Diagnosis not present

## 2021-01-09 DIAGNOSIS — Z7984 Long term (current) use of oral hypoglycemic drugs: Secondary | ICD-10-CM | POA: Insufficient documentation

## 2021-01-09 DIAGNOSIS — E119 Type 2 diabetes mellitus without complications: Secondary | ICD-10-CM | POA: Insufficient documentation

## 2021-01-09 DIAGNOSIS — K2270 Barrett's esophagus without dysplasia: Secondary | ICD-10-CM | POA: Insufficient documentation

## 2021-01-09 DIAGNOSIS — K760 Fatty (change of) liver, not elsewhere classified: Secondary | ICD-10-CM | POA: Diagnosis not present

## 2021-01-09 DIAGNOSIS — I4891 Unspecified atrial fibrillation: Secondary | ICD-10-CM | POA: Diagnosis not present

## 2021-01-09 DIAGNOSIS — J439 Emphysema, unspecified: Secondary | ICD-10-CM | POA: Insufficient documentation

## 2021-01-09 DIAGNOSIS — I700 Atherosclerosis of aorta: Secondary | ICD-10-CM | POA: Diagnosis not present

## 2021-01-09 DIAGNOSIS — Z7901 Long term (current) use of anticoagulants: Secondary | ICD-10-CM | POA: Diagnosis not present

## 2021-01-09 HISTORY — PX: ESOPHAGOGASTRODUODENOSCOPY (EGD) WITH PROPOFOL: SHX5813

## 2021-01-09 HISTORY — PX: POLYPECTOMY: SHX5525

## 2021-01-09 HISTORY — PX: BIOPSY: SHX5522

## 2021-01-09 LAB — CYTOLOGY - PAP: Diagnosis: NEGATIVE

## 2021-01-09 LAB — GLUCOSE, CAPILLARY: Glucose-Capillary: 156 mg/dL — ABNORMAL HIGH (ref 70–99)

## 2021-01-09 SURGERY — ESOPHAGOGASTRODUODENOSCOPY (EGD) WITH PROPOFOL
Anesthesia: General

## 2021-01-09 MED ORDER — LACTATED RINGERS IV SOLN
INTRAVENOUS | Status: DC | PRN
Start: 2021-01-09 — End: 2021-01-09

## 2021-01-09 MED ORDER — PROPOFOL 10 MG/ML IV BOLUS
INTRAVENOUS | Status: DC | PRN
  Administered 2021-01-09 (×3): 50 mg via INTRAVENOUS

## 2021-01-09 NOTE — Transfer of Care (Signed)
Immediate Anesthesia Transfer of Care Note  Patient: Meklit S Man  Procedure(s) Performed: ESOPHAGOGASTRODUODENOSCOPY (EGD) WITH PROPOFOL BIOPSY POLYPECTOMY  Patient Location: PACU  Anesthesia Type:General  Level of Consciousness: awake, drowsy and patient cooperative  Airway & Oxygen Therapy: Patient Spontanous Breathing  Post-op Assessment: Report given to RN, Post -op Vital signs reviewed and stable and Patient moving all extremities X 4  Post vital signs: Reviewed and stable  Last Vitals:  Vitals Value Taken Time  BP 126/84 01/09/21 1349  Temp    Pulse 63 01/09/21 1349  Resp 17 01/09/21 1349  SpO2 95 % 01/09/21 1349    Last Pain:  Vitals:   01/09/21 1349  TempSrc:   PainSc: 0-No pain      Patients Stated Pain Goal: 6 (89/37/34 2876)  Complications: No notable events documented.

## 2021-01-09 NOTE — Op Note (Signed)
[facility] Patient Name: Adrienne [name] Procedure Date: 01/09/2021 12:42 PM MRN: [id number] Date of Birth: March 31, 1955 Attending MD: Norvel Richards , MD CSN: [id number] Age: 65 Admit Type: Outpatient Procedure:                Upper GI endoscopy Indications:              Surveillance for malignancy due to personal history                            of Barrett's esophagus Providers:                Norvel Richards, MD, Janeece Riggers, RN, Randa Spike, Technician Referring MD:              Medicines:                Propofol per Anesthesia Complications:            No immediate complications. Estimated Blood Loss:     Estimated blood loss was minimal. Procedure:                Pre-Anesthesia Assessment:                           - Prior to the procedure, a History and Physical                            was performed, and patient medications and                            allergies were reviewed. The patient's tolerance of                            previous anesthesia was also reviewed. The risks                            and benefits of the procedure and the sedation                            options and risks were discussed with the patient.                            All questions were answered, and informed consent                            was obtained. Prior Anticoagulants: The patient has                            taken no previous anticoagulant or antiplatelet                            agents. ASA Grade Assessment: III - A patient with  severe systemic disease. After reviewing the risks                            and benefits, the patient was deemed in                            satisfactory condition to undergo the procedure.                           After obtaining informed consent, the endoscope was                            passed under direct vision. Throughout the                            procedure,  the patient's blood pressure, pulse, and                            oxygen saturations were monitored continuously. The                            [id number]-H190 ([id number]) scope was introduced through the                            mouth, and advanced to the second part of duodenum.                            The upper GI endoscopy was accomplished without                            difficulty. The patient tolerated the procedure                            well. Scope In: 1:34:37 PM Scope Out: 1:42:33 PM Total Procedure Duration: 0 hours 7 minutes 56 seconds  Findings:      3 quadrant "tongues" of salmon-colored epithelium coming up       approximately 2-1/2 cm above the GE junction (38-36 cm from the       incisors) no nodularity. No esophagitis.      The entire examined stomach was normal. 4 mm adenomatous appearing polyp       in the second portion of the duodenum. The duodenal polyp was hot snare       removed completely and recovered. Biopsies abnormal distal esophagus       taken for histologic study. Impression:               - Abnormal esophagus as outlined above. Status post                            biopsy.Normal stomach.                           - Duodenal polyp status post hot snare resection as  described Moderate Sedation:      Moderate (conscious) sedation was personally administered by an       anesthesia professional. The following parameters were monitored: oxygen       saturation, heart rate, blood pressure, respiratory rate, EKG, adequacy       of pulmonary ventilation, and response to care. Recommendation:           - Patient has a contact number available for                            emergencies. The signs and symptoms of potential                            delayed complications were discussed with the                            patient. Return to normal activities tomorrow.                            Written discharge instructions were  provided to the                            patient.                           - Resume previous diet.                           - Continue present medications. resume Coumadin                            today.                           - Await pathology results.                           - Return to my office in 1 year. Procedure Code(s):        --- Professional ---                           [id number], Esophagogastroduodenoscopy, flexible,                            transoral; diagnostic, including collection of                            specimen(s) by brushing or washing, when performed                            (separate procedure) Diagnosis Code(s):        --- Professional ---                           K22.70, Barrett's esophagus without dysplasia CPT copyright 2019 American Medical Association. All rights reserved. The codes documented in this report are preliminary and upon coder review may  be revised to meet  current compliance requirements. Cristopher Estimable. Stepahnie Campo, MD Norvel Richards, MD 01/09/2021 1:47:07 PM This report has been signed electronically. Number of Addenda: 0

## 2021-01-09 NOTE — Anesthesia Preprocedure Evaluation (Signed)
Anesthesia Evaluation  Patient identified by MRN, date of birth, ID band Patient awake    Reviewed: Allergy & Precautions, H&P , NPO status , Patient's Chart, lab work & pertinent test results, reviewed documented beta blocker date and time   Airway Mallampati: II  TM Distance: >3 FB Neck ROM: full    Dental no notable dental hx.    Pulmonary asthma , sleep apnea , COPD, Current Smoker and Patient abstained from smoking.,    Pulmonary exam normal breath sounds clear to auscultation       Cardiovascular Exercise Tolerance: Good hypertension, negative cardio ROS   Rhythm:regular Rate:Normal     Neuro/Psych  Headaches, Seizures -,  PSYCHIATRIC DISORDERS Anxiety Depression Bipolar Disorder  Neuromuscular disease    GI/Hepatic Neg liver ROS, GERD  Medicated,  Endo/Other  negative endocrine ROSdiabetes  Renal/GU negative Renal ROS  negative genitourinary   Musculoskeletal   Abdominal   Peds  Hematology  (+) Blood dyscrasia, anemia ,   Anesthesia Other Findings 1. Left ventricular ejection fraction, by estimation, is 60 to 65%. The  left ventricle has normal function. The left ventricle has no regional  wall motion abnormalities. There is moderate asymmetric left ventricular  hypertrophy of the basal and septal  segments. Left ventricular diastolic parameters were normal.  2. Right ventricular systolic function is normal. The right ventricular  size is normal.  3. Left atrial size was mildly dilated.  4. The mitral valve is normal in structure. Trivial mitral valve  regurgitation. No evidence of mitral stenosis.  5. The aortic valve is tricuspid. There is moderate calcification of the  aortic valve. Aortic valve regurgitation is not visualized. Mild to  moderate aortic valve sclerosis/calcification is present, without any  evidence of aortic stenosis.  6. The inferior vena cava is normal in size with greater than  50%  respiratory variability, suggesting right atrial pressure of 3 mmHg.  Reproductive/Obstetrics negative OB ROS                             Anesthesia Physical Anesthesia Plan  ASA: 3  Anesthesia Plan: General   Post-op Pain Management:    Induction:   PONV Risk Score and Plan: Propofol infusion  Airway Management Planned:   Additional Equipment:   Intra-op Plan:   Post-operative Plan:   Informed Consent: I have reviewed the patients History and Physical, chart, labs and discussed the procedure including the risks, benefits and alternatives for the proposed anesthesia with the patient or authorized representative who has indicated his/her understanding and acceptance.     Dental Advisory Given  Plan Discussed with: CRNA  Anesthesia Plan Comments:         Anesthesia Quick Evaluation

## 2021-01-09 NOTE — Interval H&P Note (Signed)
History and Physical Interval Note:  01/09/2021 1:06 PM  Adrienne [name]  has presented today for surgery, with the diagnosis of history of Barret's esophagus.  The various methods of treatment have been discussed with the patient and family. After consideration of risks, benefits and other options for treatment, the patient has consented to  Procedure(s) with comments: ESOPHAGOGASTRODUODENOSCOPY (EGD) WITH PROPOFOL (N/A) - 12:45pm as a surgical intervention.  The patient's history has been reviewed, patient examined, no change in status, stable for surgery.  I have reviewed the patient's chart and labs.  Questions were answered to the patient's satisfaction.     Adrienne [name]  No change.  No dysphagia.  Coumadin held 3 days ago.  Surveillance EGD per plan.  The risks, benefits, limitations, alternatives and imponderables have been reviewed with the patient. Potential for esophageal dilation, biopsy, etc. have also been reviewed.  Questions have been answered. All parties agreeable.

## 2021-01-09 NOTE — Discharge Instructions (Signed)
EGD Discharge instructions Please read the instructions outlined below and refer to this sheet in the next few weeks. These discharge instructions provide you with general information on caring for yourself after you leave the hospital. Your doctor may also give you specific instructions. While your treatment has been planned according to the most current medical practices available, unavoidable complications occasionally occur. If you have any problems or questions after discharge, please call your doctor. ACTIVITY You may resume your regular activity but move at a slower pace for the next 24 hours.  Take frequent rest periods for the next 24 hours.  Walking will help expel (get rid of) the air and reduce the bloated feeling in your abdomen.  No driving for 24 hours (because of the anesthesia (medicine) used during the test).  You may shower.  Do not sign any important legal documents or operate any machinery for 24 hours (because of the anesthesia used during the test).  NUTRITION Drink plenty of fluids.  You may resume your normal diet.  Begin with a light meal and progress to your normal diet.  Avoid alcoholic beverages for 24 hours or as instructed by your caregiver.  MEDICATIONS You may resume your normal medications unless your caregiver tells you otherwise.  WHAT YOU CAN EXPECT TODAY You may experience abdominal discomfort such as a feeling of fullness or "gas" pains.  FOLLOW-UP Your doctor will discuss the results of your test with you.  SEEK IMMEDIATE MEDICAL ATTENTION IF ANY OF THE FOLLOWING OCCUR: Excessive nausea (feeling sick to your stomach) and/or vomiting.  Severe abdominal pain and distention (swelling).  Trouble swallowing.  Temperature over 101 F (37.8 C).  Rectal bleeding or vomiting of blood.    Your esophagus was biopsied today.  A small polyp was found in your duodenum and it was removed  Resume Coumadin today  Further recommendations to follow pending review  of pathology report  Office visit with [place] in 1 year.  At patient request, called Debbie at [phone] -call rolled to voicemail.  I left a message.

## 2021-01-10 NOTE — Anesthesia Postprocedure Evaluation (Signed)
Anesthesia Post Note  Patient: Dessiree S Bordley  Procedure(s) Performed: ESOPHAGOGASTRODUODENOSCOPY (EGD) WITH PROPOFOL BIOPSY POLYPECTOMY  Patient location during evaluation: Phase II Anesthesia Type: General Level of consciousness: awake Pain management: pain level controlled Vital Signs Assessment: post-procedure vital signs reviewed and stable Respiratory status: spontaneous breathing and respiratory function stable Cardiovascular status: blood pressure returned to baseline and stable Postop Assessment: no headache and no apparent nausea or vomiting Anesthetic complications: no Comments: Late entry   No notable events documented.   Last Vitals:  Vitals:   01/09/21 1200 01/09/21 1349  BP: (P) 137/70 126/84  Pulse:  63  Resp:  17  Temp:    SpO2:  95%    Last Pain:  Vitals:   01/09/21 1349  TempSrc:   PainSc: 0-No pain                 Louann Sjogren

## 2021-01-13 LAB — SURGICAL PATHOLOGY

## 2021-01-14 ENCOUNTER — Encounter: Payer: Self-pay | Admitting: Internal Medicine

## 2021-01-14 ENCOUNTER — Encounter (HOSPITAL_COMMUNITY): Payer: Self-pay | Admitting: Internal Medicine

## 2021-01-16 ENCOUNTER — Other Ambulatory Visit: Payer: Self-pay

## 2021-01-16 ENCOUNTER — Ambulatory Visit (INDEPENDENT_AMBULATORY_CARE_PROVIDER_SITE_OTHER): Payer: Medicare Other | Admitting: *Deleted

## 2021-01-16 DIAGNOSIS — I4891 Unspecified atrial fibrillation: Secondary | ICD-10-CM | POA: Diagnosis not present

## 2021-01-16 DIAGNOSIS — Z5181 Encounter for therapeutic drug level monitoring: Secondary | ICD-10-CM | POA: Diagnosis not present

## 2021-01-16 LAB — POCT INR: INR: 1.8 — AB (ref 2.0–3.0)

## 2021-01-16 NOTE — Patient Instructions (Signed)
Increase warfarin to 1 1/2 tablets daily Recheck on 02/06/21

## 2021-01-19 ENCOUNTER — Encounter: Payer: Self-pay | Admitting: Obstetrics & Gynecology

## 2021-01-24 ENCOUNTER — Other Ambulatory Visit: Payer: Self-pay

## 2021-01-24 ENCOUNTER — Encounter (INDEPENDENT_AMBULATORY_CARE_PROVIDER_SITE_OTHER): Payer: Medicare Other | Admitting: Ophthalmology

## 2021-01-24 DIAGNOSIS — I10 Essential (primary) hypertension: Secondary | ICD-10-CM | POA: Diagnosis not present

## 2021-01-24 DIAGNOSIS — H353231 Exudative age-related macular degeneration, bilateral, with active choroidal neovascularization: Secondary | ICD-10-CM | POA: Diagnosis not present

## 2021-01-24 DIAGNOSIS — D3132 Benign neoplasm of left choroid: Secondary | ICD-10-CM | POA: Diagnosis not present

## 2021-01-24 DIAGNOSIS — H35033 Hypertensive retinopathy, bilateral: Secondary | ICD-10-CM | POA: Diagnosis not present

## 2021-01-24 DIAGNOSIS — H43813 Vitreous degeneration, bilateral: Secondary | ICD-10-CM

## 2021-02-10 ENCOUNTER — Other Ambulatory Visit: Payer: Self-pay

## 2021-02-10 ENCOUNTER — Ambulatory Visit (INDEPENDENT_AMBULATORY_CARE_PROVIDER_SITE_OTHER): Payer: Medicare Other | Admitting: *Deleted

## 2021-02-10 DIAGNOSIS — I4891 Unspecified atrial fibrillation: Secondary | ICD-10-CM

## 2021-02-10 DIAGNOSIS — Z5181 Encounter for therapeutic drug level monitoring: Secondary | ICD-10-CM

## 2021-02-10 LAB — POCT INR: INR: 2 (ref 2.0–3.0)

## 2021-02-10 NOTE — Patient Instructions (Signed)
Continue warfarin 1 1/2 tablets daily Recheck on 03/12/20

## 2021-02-21 ENCOUNTER — Encounter (INDEPENDENT_AMBULATORY_CARE_PROVIDER_SITE_OTHER): Payer: Medicare Other | Admitting: Ophthalmology

## 2021-03-12 ENCOUNTER — Ambulatory Visit (INDEPENDENT_AMBULATORY_CARE_PROVIDER_SITE_OTHER): Payer: Medicare Other | Admitting: *Deleted

## 2021-03-12 ENCOUNTER — Other Ambulatory Visit (HOSPITAL_COMMUNITY)
Admission: RE | Admit: 2021-03-12 | Discharge: 2021-03-12 | Disposition: A | Discharge status: Home / Self Care | Payer: Medicare Other | Source: Ambulatory Visit | Attending: Internal Medicine | Admitting: Internal Medicine

## 2021-03-12 DIAGNOSIS — Z5181 Encounter for therapeutic drug level monitoring: Secondary | ICD-10-CM

## 2021-03-12 DIAGNOSIS — I4891 Unspecified atrial fibrillation: Secondary | ICD-10-CM | POA: Diagnosis present

## 2021-03-12 LAB — PROTIME-INR
INR: 5.1 (ref 0.8–1.2)
Prothrombin Time: 47 seconds — ABNORMAL HIGH (ref 11.4–15.2)

## 2021-03-12 LAB — POCT INR: INR: 8 — AB (ref 2.0–3.0)

## 2021-03-12 NOTE — Patient Instructions (Signed)
POC INR >8.0  Sent to [facility] lab  INR 5.08 Hold warfarin x 3 days (Wed,Thurs,Fri) then resume 1 1/2 tablets daily Recheck on 03/17/20 Pt denies any s/s of bleeding.  Bleeding and fall precautions discussed and she verbalized understanding.  Pt knows to go to ED if symptoms develop. Instructions given to pt's sister Jackelyn Poling who manages her meds

## 2021-03-14 ENCOUNTER — Other Ambulatory Visit: Payer: Self-pay

## 2021-03-14 ENCOUNTER — Encounter (INDEPENDENT_AMBULATORY_CARE_PROVIDER_SITE_OTHER): Payer: Medicare Other | Admitting: Ophthalmology

## 2021-03-14 DIAGNOSIS — D3132 Benign neoplasm of left choroid: Secondary | ICD-10-CM

## 2021-03-14 DIAGNOSIS — H353231 Exudative age-related macular degeneration, bilateral, with active choroidal neovascularization: Secondary | ICD-10-CM | POA: Diagnosis not present

## 2021-03-14 DIAGNOSIS — H43813 Vitreous degeneration, bilateral: Secondary | ICD-10-CM | POA: Diagnosis not present

## 2021-03-14 DIAGNOSIS — H35033 Hypertensive retinopathy, bilateral: Secondary | ICD-10-CM

## 2021-03-14 DIAGNOSIS — I10 Essential (primary) hypertension: Secondary | ICD-10-CM | POA: Diagnosis not present

## 2021-03-17 ENCOUNTER — Ambulatory Visit (INDEPENDENT_AMBULATORY_CARE_PROVIDER_SITE_OTHER): Payer: Medicare Other | Admitting: *Deleted

## 2021-03-17 DIAGNOSIS — Z5181 Encounter for therapeutic drug level monitoring: Secondary | ICD-10-CM | POA: Diagnosis not present

## 2021-03-17 DIAGNOSIS — I4891 Unspecified atrial fibrillation: Secondary | ICD-10-CM

## 2021-03-17 LAB — POCT INR: INR: 1.1 — AB (ref 2.0–3.0)

## 2021-03-17 NOTE — Patient Instructions (Signed)
Take warfarin 2 tablets tonight then resume 1 1/2 tablets daily Instructions given to pt's sister Jackelyn Poling who manages her meds Recheck in 1 wk

## 2021-03-20 ENCOUNTER — Encounter: Payer: Self-pay | Admitting: *Deleted

## 2021-03-24 ENCOUNTER — Ambulatory Visit (INDEPENDENT_AMBULATORY_CARE_PROVIDER_SITE_OTHER): Payer: Medicare Other | Admitting: *Deleted

## 2021-03-24 ENCOUNTER — Other Ambulatory Visit: Payer: Self-pay

## 2021-03-24 DIAGNOSIS — I4891 Unspecified atrial fibrillation: Secondary | ICD-10-CM | POA: Diagnosis not present

## 2021-03-24 DIAGNOSIS — Z5181 Encounter for therapeutic drug level monitoring: Secondary | ICD-10-CM

## 2021-03-24 LAB — POCT INR: INR: 4.9 — AB (ref 2.0–3.0)

## 2021-03-24 NOTE — Patient Instructions (Signed)
Hold warfarin tonight then decrease dose to 1 1/2 tablets daily except 1 tablet on Tuesdays, Thursdays and Saturdays Instructions given to pt's sister Jackelyn Poling who manages her meds Recheck in 2 wks

## 2021-04-07 ENCOUNTER — Ambulatory Visit (INDEPENDENT_AMBULATORY_CARE_PROVIDER_SITE_OTHER): Payer: Medicare Other | Admitting: *Deleted

## 2021-04-07 DIAGNOSIS — Z5181 Encounter for therapeutic drug level monitoring: Secondary | ICD-10-CM | POA: Diagnosis not present

## 2021-04-07 DIAGNOSIS — I4891 Unspecified atrial fibrillation: Secondary | ICD-10-CM | POA: Diagnosis not present

## 2021-04-07 LAB — POCT INR: INR: 1.5 — AB (ref 2.0–3.0)

## 2021-04-07 NOTE — Patient Instructions (Signed)
Increase warfarin to 1 1/2 tablets daily except 1 tablet on Saturdays Instructions given to pt's sister Jackelyn Poling who manages her meds Recheck in 3 wks

## 2021-04-10 DIAGNOSIS — E785 Hyperlipidemia, unspecified: Secondary | ICD-10-CM | POA: Diagnosis not present

## 2021-04-10 DIAGNOSIS — E118 Type 2 diabetes mellitus with unspecified complications: Secondary | ICD-10-CM | POA: Diagnosis not present

## 2021-04-10 DIAGNOSIS — Z79899 Other long term (current) drug therapy: Secondary | ICD-10-CM | POA: Diagnosis not present

## 2021-04-14 ENCOUNTER — Encounter (INDEPENDENT_AMBULATORY_CARE_PROVIDER_SITE_OTHER): Payer: Medicare Other | Admitting: Ophthalmology

## 2021-04-14 ENCOUNTER — Other Ambulatory Visit: Payer: Self-pay

## 2021-04-14 DIAGNOSIS — H353231 Exudative age-related macular degeneration, bilateral, with active choroidal neovascularization: Secondary | ICD-10-CM

## 2021-04-14 DIAGNOSIS — H43813 Vitreous degeneration, bilateral: Secondary | ICD-10-CM

## 2021-04-14 DIAGNOSIS — D3132 Benign neoplasm of left choroid: Secondary | ICD-10-CM

## 2021-04-14 DIAGNOSIS — I10 Essential (primary) hypertension: Secondary | ICD-10-CM | POA: Diagnosis not present

## 2021-04-14 DIAGNOSIS — H35033 Hypertensive retinopathy, bilateral: Secondary | ICD-10-CM | POA: Diagnosis not present

## 2021-04-17 DIAGNOSIS — R7309 Other abnormal glucose: Secondary | ICD-10-CM | POA: Diagnosis not present

## 2021-04-17 DIAGNOSIS — R609 Edema, unspecified: Secondary | ICD-10-CM | POA: Diagnosis not present

## 2021-04-17 DIAGNOSIS — I480 Paroxysmal atrial fibrillation: Secondary | ICD-10-CM | POA: Diagnosis not present

## 2021-04-17 DIAGNOSIS — E1100 Type 2 diabetes mellitus with hyperosmolarity without nonketotic hyperglycemic-hyperosmolar coma (NKHHC): Secondary | ICD-10-CM | POA: Diagnosis not present

## 2021-04-17 DIAGNOSIS — E785 Hyperlipidemia, unspecified: Secondary | ICD-10-CM | POA: Diagnosis not present

## 2021-04-28 DIAGNOSIS — F419 Anxiety disorder, unspecified: Secondary | ICD-10-CM | POA: Diagnosis not present

## 2021-04-28 DIAGNOSIS — F3481 Disruptive mood dysregulation disorder: Secondary | ICD-10-CM | POA: Diagnosis not present

## 2021-04-29 ENCOUNTER — Ambulatory Visit (INDEPENDENT_AMBULATORY_CARE_PROVIDER_SITE_OTHER): Payer: Medicare Other | Admitting: *Deleted

## 2021-04-29 DIAGNOSIS — I4891 Unspecified atrial fibrillation: Secondary | ICD-10-CM | POA: Diagnosis not present

## 2021-04-29 DIAGNOSIS — Z5181 Encounter for therapeutic drug level monitoring: Secondary | ICD-10-CM | POA: Diagnosis not present

## 2021-04-29 LAB — POCT INR: INR: 2.5 (ref 2.0–3.0)

## 2021-04-29 NOTE — Patient Instructions (Signed)
Continue warfarin 1 1/2 tablets daily except 1 tablet on Saturdays Instructions given to pt's sister Jackelyn Poling who manages her meds Recheck in 4 wks

## 2021-05-01 DIAGNOSIS — M79671 Pain in right foot: Secondary | ICD-10-CM | POA: Diagnosis not present

## 2021-05-01 DIAGNOSIS — M79674 Pain in right toe(s): Secondary | ICD-10-CM | POA: Diagnosis not present

## 2021-05-01 DIAGNOSIS — M79672 Pain in left foot: Secondary | ICD-10-CM | POA: Diagnosis not present

## 2021-05-01 DIAGNOSIS — L110 Acquired keratosis follicularis: Secondary | ICD-10-CM | POA: Diagnosis not present

## 2021-05-01 DIAGNOSIS — E1140 Type 2 diabetes mellitus with diabetic neuropathy, unspecified: Secondary | ICD-10-CM | POA: Diagnosis not present

## 2021-05-01 DIAGNOSIS — M79675 Pain in left toe(s): Secondary | ICD-10-CM | POA: Diagnosis not present

## 2021-05-12 ENCOUNTER — Encounter (INDEPENDENT_AMBULATORY_CARE_PROVIDER_SITE_OTHER): Payer: Medicare Other | Admitting: Ophthalmology

## 2021-05-12 ENCOUNTER — Other Ambulatory Visit: Payer: Self-pay

## 2021-05-12 DIAGNOSIS — H43813 Vitreous degeneration, bilateral: Secondary | ICD-10-CM | POA: Diagnosis not present

## 2021-05-12 DIAGNOSIS — H35033 Hypertensive retinopathy, bilateral: Secondary | ICD-10-CM | POA: Diagnosis not present

## 2021-05-12 DIAGNOSIS — H2703 Aphakia, bilateral: Secondary | ICD-10-CM | POA: Diagnosis not present

## 2021-05-12 DIAGNOSIS — I10 Essential (primary) hypertension: Secondary | ICD-10-CM

## 2021-05-12 DIAGNOSIS — D3132 Benign neoplasm of left choroid: Secondary | ICD-10-CM

## 2021-05-12 DIAGNOSIS — H353231 Exudative age-related macular degeneration, bilateral, with active choroidal neovascularization: Secondary | ICD-10-CM | POA: Diagnosis not present

## 2021-05-15 DIAGNOSIS — D2271 Melanocytic nevi of right lower limb, including hip: Secondary | ICD-10-CM | POA: Diagnosis not present

## 2021-05-15 DIAGNOSIS — Z1283 Encounter for screening for malignant neoplasm of skin: Secondary | ICD-10-CM | POA: Diagnosis not present

## 2021-05-15 DIAGNOSIS — D485 Neoplasm of uncertain behavior of skin: Secondary | ICD-10-CM | POA: Diagnosis not present

## 2021-05-15 DIAGNOSIS — L720 Epidermal cyst: Secondary | ICD-10-CM | POA: Diagnosis not present

## 2021-05-15 DIAGNOSIS — D225 Melanocytic nevi of trunk: Secondary | ICD-10-CM | POA: Diagnosis not present

## 2021-06-02 ENCOUNTER — Ambulatory Visit (INDEPENDENT_AMBULATORY_CARE_PROVIDER_SITE_OTHER): Payer: Medicare Other | Admitting: *Deleted

## 2021-06-02 DIAGNOSIS — Z5181 Encounter for therapeutic drug level monitoring: Secondary | ICD-10-CM | POA: Diagnosis not present

## 2021-06-02 DIAGNOSIS — I4891 Unspecified atrial fibrillation: Secondary | ICD-10-CM

## 2021-06-02 LAB — POCT INR: INR: 1.3 — AB (ref 2.0–3.0)

## 2021-06-02 NOTE — Patient Instructions (Signed)
Take warfarin 2 1/2 tablets tonight and tomorrow night then resume 1 1/2 tablets daily except 1 tablet on Saturdays ?Recheck in 2 wks ?

## 2021-06-04 ENCOUNTER — Telehealth: Payer: Self-pay | Admitting: *Deleted

## 2021-06-04 MED ORDER — WARFARIN SODIUM 5 MG PO TABS: ORAL_TABLET | ORAL | 3 refills | Status: DC

## 2021-06-04 NOTE — Telephone Encounter (Signed)
Warfarin refill approved ?

## 2021-06-09 ENCOUNTER — Encounter (INDEPENDENT_AMBULATORY_CARE_PROVIDER_SITE_OTHER): Payer: Medicare Other | Admitting: Ophthalmology

## 2021-06-09 ENCOUNTER — Telehealth: Payer: Self-pay | Admitting: *Deleted

## 2021-06-09 MED ORDER — WARFARIN SODIUM 5 MG PO TABS: ORAL_TABLET | ORAL | 3 refills | Status: DC

## 2021-06-09 NOTE — Telephone Encounter (Signed)
Warfarin was previously sent to [facility].  They have changed pharmacy due to her insurance.  Rx resent to [facility] [address]  Desmond Dike told. ?

## 2021-06-09 NOTE — Telephone Encounter (Signed)
Patient sister called she needs her sister's script for warfarin sent to [facility] on [address] in [place].  If you can please give Jackelyn Poling a call.  ?

## 2021-06-13 ENCOUNTER — Encounter (INDEPENDENT_AMBULATORY_CARE_PROVIDER_SITE_OTHER): Payer: Medicare Other | Admitting: Ophthalmology

## 2021-06-13 DIAGNOSIS — I10 Essential (primary) hypertension: Secondary | ICD-10-CM | POA: Diagnosis not present

## 2021-06-13 DIAGNOSIS — H35033 Hypertensive retinopathy, bilateral: Secondary | ICD-10-CM

## 2021-06-13 DIAGNOSIS — H43813 Vitreous degeneration, bilateral: Secondary | ICD-10-CM

## 2021-06-13 DIAGNOSIS — D3132 Benign neoplasm of left choroid: Secondary | ICD-10-CM

## 2021-06-13 DIAGNOSIS — H353231 Exudative age-related macular degeneration, bilateral, with active choroidal neovascularization: Secondary | ICD-10-CM

## 2021-06-16 ENCOUNTER — Ambulatory Visit (INDEPENDENT_AMBULATORY_CARE_PROVIDER_SITE_OTHER): Payer: Medicare Other | Admitting: *Deleted

## 2021-06-16 DIAGNOSIS — I4891 Unspecified atrial fibrillation: Secondary | ICD-10-CM

## 2021-06-16 DIAGNOSIS — Z5181 Encounter for therapeutic drug level monitoring: Secondary | ICD-10-CM | POA: Diagnosis not present

## 2021-06-16 LAB — POCT INR: INR: 1.3 — AB (ref 2.0–3.0)

## 2021-06-16 NOTE — Patient Instructions (Signed)
Increase warfarin to 1 1/2 tablets daily except 2 tablet on Mondays, Wednesdays and Fridays ?Recheck in 2 wks ?

## 2021-07-02 ENCOUNTER — Ambulatory Visit (INDEPENDENT_AMBULATORY_CARE_PROVIDER_SITE_OTHER): Payer: Medicare Other | Admitting: *Deleted

## 2021-07-02 DIAGNOSIS — Z5181 Encounter for therapeutic drug level monitoring: Secondary | ICD-10-CM

## 2021-07-02 DIAGNOSIS — I4891 Unspecified atrial fibrillation: Secondary | ICD-10-CM | POA: Diagnosis not present

## 2021-07-02 LAB — POCT INR: INR: 2.7 (ref 2.0–3.0)

## 2021-07-02 NOTE — Patient Instructions (Signed)
Continue warfarin 1 1/2 tablets daily except 2 tablet on Mondays, Wednesdays and Fridays ?Recheck in 3 wks ?

## 2021-07-08 ENCOUNTER — Telehealth: Payer: Self-pay | Admitting: Internal Medicine

## 2021-07-08 ENCOUNTER — Other Ambulatory Visit: Payer: Self-pay

## 2021-07-08 DIAGNOSIS — F319 Bipolar disorder, unspecified: Secondary | ICD-10-CM | POA: Diagnosis not present

## 2021-07-08 DIAGNOSIS — Z79899 Other long term (current) drug therapy: Secondary | ICD-10-CM | POA: Diagnosis not present

## 2021-07-08 DIAGNOSIS — I480 Paroxysmal atrial fibrillation: Secondary | ICD-10-CM | POA: Diagnosis not present

## 2021-07-08 DIAGNOSIS — E118 Type 2 diabetes mellitus with unspecified complications: Secondary | ICD-10-CM | POA: Diagnosis not present

## 2021-07-08 MED ORDER — OMEPRAZOLE 20 MG PO CPDR: 20.0000 mg | DELAYED_RELEASE_CAPSULE | Freq: Two times a day (BID) | ORAL | 3 refills | Status: AC

## 2021-07-08 NOTE — Telephone Encounter (Signed)
Last ov 12/10/2020 ?

## 2021-07-08 NOTE — Telephone Encounter (Signed)
Refill was sent in to [facility] on [address] Patient was notified.  ?

## 2021-07-08 NOTE — Telephone Encounter (Signed)
Patient needs omeprazole sent to [facility] on [address]  ?

## 2021-07-15 DIAGNOSIS — I480 Paroxysmal atrial fibrillation: Secondary | ICD-10-CM | POA: Diagnosis not present

## 2021-07-15 DIAGNOSIS — L723 Sebaceous cyst: Secondary | ICD-10-CM | POA: Diagnosis not present

## 2021-07-15 DIAGNOSIS — R7309 Other abnormal glucose: Secondary | ICD-10-CM | POA: Diagnosis not present

## 2021-07-15 DIAGNOSIS — E1169 Type 2 diabetes mellitus with other specified complication: Secondary | ICD-10-CM | POA: Diagnosis not present

## 2021-07-15 DIAGNOSIS — R609 Edema, unspecified: Secondary | ICD-10-CM | POA: Diagnosis not present

## 2021-07-18 ENCOUNTER — Other Ambulatory Visit (HOSPITAL_COMMUNITY): Payer: Self-pay | Admitting: Internal Medicine

## 2021-07-18 ENCOUNTER — Encounter (INDEPENDENT_AMBULATORY_CARE_PROVIDER_SITE_OTHER): Payer: Medicare Other | Admitting: Ophthalmology

## 2021-07-18 DIAGNOSIS — I10 Essential (primary) hypertension: Secondary | ICD-10-CM | POA: Diagnosis not present

## 2021-07-18 DIAGNOSIS — H353231 Exudative age-related macular degeneration, bilateral, with active choroidal neovascularization: Secondary | ICD-10-CM

## 2021-07-18 DIAGNOSIS — H43813 Vitreous degeneration, bilateral: Secondary | ICD-10-CM | POA: Diagnosis not present

## 2021-07-18 DIAGNOSIS — H35033 Hypertensive retinopathy, bilateral: Secondary | ICD-10-CM | POA: Diagnosis not present

## 2021-07-18 DIAGNOSIS — N63 Unspecified lump in unspecified breast: Secondary | ICD-10-CM

## 2021-07-18 DIAGNOSIS — D3132 Benign neoplasm of left choroid: Secondary | ICD-10-CM | POA: Diagnosis not present

## 2021-07-18 DIAGNOSIS — Z1231 Encounter for screening mammogram for malignant neoplasm of breast: Secondary | ICD-10-CM

## 2021-07-23 ENCOUNTER — Ambulatory Visit (INDEPENDENT_AMBULATORY_CARE_PROVIDER_SITE_OTHER): Payer: Medicare Other | Admitting: *Deleted

## 2021-07-23 DIAGNOSIS — Z5181 Encounter for therapeutic drug level monitoring: Secondary | ICD-10-CM | POA: Diagnosis not present

## 2021-07-23 DIAGNOSIS — I4891 Unspecified atrial fibrillation: Secondary | ICD-10-CM | POA: Diagnosis not present

## 2021-07-23 LAB — POCT INR: INR: 3.9 — AB (ref 2.0–3.0)

## 2021-07-23 NOTE — Patient Instructions (Signed)
Hold warfarin tonight then resume 1 1/2 tablets daily except 2 tablet on Mondays, Wednesdays and Fridays Recheck in 3 wks

## 2021-08-01 ENCOUNTER — Ambulatory Visit (INDEPENDENT_AMBULATORY_CARE_PROVIDER_SITE_OTHER): Payer: Medicare Other | Admitting: Plastic Surgery

## 2021-08-01 ENCOUNTER — Encounter: Payer: Self-pay | Admitting: Obstetrics & Gynecology

## 2021-08-01 ENCOUNTER — Encounter: Payer: Self-pay | Admitting: Plastic Surgery

## 2021-08-01 VITALS — BP 139/85 | HR 95 | Ht 63.0 in | Wt 190.0 lb

## 2021-08-01 DIAGNOSIS — D489 Neoplasm of uncertain behavior, unspecified: Secondary | ICD-10-CM

## 2021-08-01 DIAGNOSIS — D485 Neoplasm of uncertain behavior of skin: Secondary | ICD-10-CM | POA: Diagnosis not present

## 2021-08-01 NOTE — Progress Notes (Signed)
   Referring Provider Asencion Noble, MD [address] [place],  [place] [address]   CC:  Cystic lesions left and right face  Adrienne [name] is an 66 y.o. female.  HPI: Patient is a 66 year old with cystic lesions of the left and right face.  Been present for approximately 1 year.  There is one along the left mandible angle and 1 on the right cheek.  She has not had any drainage.  These have not been biopsied.2    Allergies  Allergen Reactions   Codeine Other (See Comments)    Headaches   Sulfa Antibiotics Rash    Outpatient Encounter Medications as of 08/01/2021  Medication Sig   ALPRAZolam (XANAX) 1 MG tablet Take by mouth.   cyclobenzaprine (FLEXERIL) 10 MG tablet Take 10 mg by mouth 3 (three) times daily.   furosemide (LASIX) 20 MG tablet Take 20 mg by mouth every morning.   glimepiride (AMARYL) 1 MG tablet Take 1 mg by mouth daily.   metFORMIN (GLUCOPHAGE) 1000 MG tablet Take 1,000 mg by mouth 2 (two) times daily.   methylphenidate (RITALIN) 20 MG tablet Take 20 mg by mouth 3 (three) times daily.   metoprolol tartrate (LOPRESSOR) 50 MG tablet Take 50 mg by mouth 2 (two) times daily.   omeprazole (PRILOSEC) 20 MG capsule Take 20 mg by mouth 2 (two) times daily.   prazosin (MINIPRESS) 1 MG capsule Take 1 mg by mouth at bedtime.   rosuvastatin (CRESTOR) 20 MG tablet Take 20 mg by mouth daily.   traZODone (DESYREL) 50 MG tablet Take 50 mg by mouth at bedtime.   venlafaxine XR (EFFEXOR-XR) 150 MG 24 hr capsule Take 150 mg by mouth daily.   warfarin (COUMADIN) 5 MG tablet Take by mouth.   No facility-administered encounter medications on file as of 08/01/2021.     No past medical history on file.   No family history on file.  Social History   Social History Narrative   Not on file     Review of Systems General: Denies fevers, chills, weight loss CV: Denies chest pain, shortness of breath, palpitations   Physical Exam    08/01/2021    1:18 PM  Vitals with BMI   Height '5\' 3"'$   Weight 190 lbs  BMI 79.02  Systolic 409  Diastolic 85  Pulse 95    General:  No acute distress,  Alert and oriented, Non-Toxic, Normal speech and affect HEENT 1.8 x 1.8 cm left cheek lesion along the angle of the mandible and a by 8 mm right cheek lesion.  Assessment/Plan Excision under local indicated.  I discussed with the patient these are most likely sebaceous cyst.  We discussed that the left angle of mandible lesion is in the danger zone for facial nerve injury so there would be a small risk of nerve injury in this region which would affect the muscles of facial expression..  The patient wishes to proceed.    Lennice Sites 08/01/2021, 2:34 PM

## 2021-08-05 ENCOUNTER — Ambulatory Visit (HOSPITAL_COMMUNITY)
Admission: RE | Admit: 2021-08-05 | Discharge: 2021-08-05 | Disposition: A | Discharge status: Home / Self Care | Payer: Medicare Other | Source: Ambulatory Visit | Attending: Internal Medicine | Admitting: Internal Medicine

## 2021-08-05 DIAGNOSIS — N63 Unspecified lump in unspecified breast: Secondary | ICD-10-CM

## 2021-08-05 DIAGNOSIS — N6311 Unspecified lump in the right breast, upper outer quadrant: Secondary | ICD-10-CM | POA: Diagnosis not present

## 2021-08-05 DIAGNOSIS — N6489 Other specified disorders of breast: Secondary | ICD-10-CM | POA: Diagnosis not present

## 2021-08-05 DIAGNOSIS — R928 Other abnormal and inconclusive findings on diagnostic imaging of breast: Secondary | ICD-10-CM | POA: Diagnosis not present

## 2021-08-06 ENCOUNTER — Telehealth: Payer: Self-pay | Admitting: Internal Medicine

## 2021-08-06 MED ORDER — WARFARIN SODIUM 5 MG PO TABS: ORAL_TABLET | ORAL | 3 refills | Status: DC

## 2021-08-06 NOTE — Telephone Encounter (Signed)
Request for warfarin refill:  Last INR was 3.9 on 07/23/21 Next INR due on 08/13/21 LOV was 04/29/21  Zandra Abts MD  Refill approved

## 2021-08-06 NOTE — Telephone Encounter (Signed)
*  STAT* If patient is at the pharmacy, call can be transferred to refill team.   1. Which medications need to be refilled? (please list name of each medication and dose if known)  warfarin (COUMADIN) 5 MG tablet Patient takes 2 tablets MWF and 1.5 tablets every other day  2. Which pharmacy/location (including street and city if local pharmacy) is medication to be sent to? [facility] [place], [place] [facility] [place]  3. Do they need a 30 day or 90 day supply? 17    Patient's sister would like to know if Lattie Haw could just write the rx to be taken twice daily so that the patient does not run out of medication. The sister said she has issues with the pharmacy every month because of the constant dosage changes

## 2021-08-07 DIAGNOSIS — L110 Acquired keratosis follicularis: Secondary | ICD-10-CM | POA: Diagnosis not present

## 2021-08-07 DIAGNOSIS — M79675 Pain in left toe(s): Secondary | ICD-10-CM | POA: Diagnosis not present

## 2021-08-07 DIAGNOSIS — M79674 Pain in right toe(s): Secondary | ICD-10-CM | POA: Diagnosis not present

## 2021-08-07 DIAGNOSIS — M79671 Pain in right foot: Secondary | ICD-10-CM | POA: Diagnosis not present

## 2021-08-07 DIAGNOSIS — M79672 Pain in left foot: Secondary | ICD-10-CM | POA: Diagnosis not present

## 2021-08-07 DIAGNOSIS — E1140 Type 2 diabetes mellitus with diabetic neuropathy, unspecified: Secondary | ICD-10-CM | POA: Diagnosis not present

## 2021-08-13 ENCOUNTER — Ambulatory Visit (INDEPENDENT_AMBULATORY_CARE_PROVIDER_SITE_OTHER): Payer: Medicare Other | Admitting: *Deleted

## 2021-08-13 DIAGNOSIS — I4891 Unspecified atrial fibrillation: Secondary | ICD-10-CM | POA: Diagnosis not present

## 2021-08-13 DIAGNOSIS — Z5181 Encounter for therapeutic drug level monitoring: Secondary | ICD-10-CM | POA: Diagnosis not present

## 2021-08-13 LAB — POCT INR: INR: 2.2 (ref 2.0–3.0)

## 2021-08-13 MED ORDER — WARFARIN SODIUM 5 MG PO TABS: ORAL_TABLET | ORAL | 3 refills | Status: DC

## 2021-08-13 NOTE — Patient Instructions (Signed)
Continue warfarin 1 1/2 tablets daily except 2 tablet on Mondays, Wednesdays and Fridays Recheck in 4 wks

## 2021-08-20 ENCOUNTER — Encounter (INDEPENDENT_AMBULATORY_CARE_PROVIDER_SITE_OTHER): Payer: Medicare Other | Admitting: Ophthalmology

## 2021-08-20 DIAGNOSIS — H35033 Hypertensive retinopathy, bilateral: Secondary | ICD-10-CM

## 2021-08-20 DIAGNOSIS — H43813 Vitreous degeneration, bilateral: Secondary | ICD-10-CM | POA: Diagnosis not present

## 2021-08-20 DIAGNOSIS — D3132 Benign neoplasm of left choroid: Secondary | ICD-10-CM

## 2021-08-20 DIAGNOSIS — I10 Essential (primary) hypertension: Secondary | ICD-10-CM

## 2021-08-20 DIAGNOSIS — H353231 Exudative age-related macular degeneration, bilateral, with active choroidal neovascularization: Secondary | ICD-10-CM

## 2021-09-03 ENCOUNTER — Other Ambulatory Visit: Payer: Self-pay | Admitting: Internal Medicine

## 2021-09-05 ENCOUNTER — Ambulatory Visit (INDEPENDENT_AMBULATORY_CARE_PROVIDER_SITE_OTHER): Payer: Medicare Other | Admitting: Plastic Surgery

## 2021-09-05 VITALS — BP 127/90 | HR 88

## 2021-09-05 DIAGNOSIS — L723 Sebaceous cyst: Secondary | ICD-10-CM

## 2021-09-05 DIAGNOSIS — D489 Neoplasm of uncertain behavior, unspecified: Secondary | ICD-10-CM

## 2021-09-05 DIAGNOSIS — L720 Epidermal cyst: Secondary | ICD-10-CM | POA: Diagnosis not present

## 2021-09-05 DIAGNOSIS — L989 Disorder of the skin and subcutaneous tissue, unspecified: Secondary | ICD-10-CM

## 2021-09-05 NOTE — Progress Notes (Signed)
Operative Note   DATE OF OPERATION: 09/05/2021  LOCATION:    SURGICAL DEPARTMENT: Plastic Surgery  PREOPERATIVE DIAGNOSES: Left and right face cystic lesions  POSTOPERATIVE DIAGNOSES:  same  PROCEDURE:  Excision of left face lesion measuring 2 cm Left face closure measuring 2 cm Excision of right face lesion measuring 1.1 cm Right face closure measuring 1.1 cm  SURGEON: Damaya Channing P. Phoenix Riesen, MD  ANESTHESIA:  Local  COMPLICATIONS: None.   INDICATIONS FOR PROCEDURE:  The patient, Adrienne [name] is a 66 y.o. female born on [birth year], is here for treatment of cystic lesions bilateral face. MRN: [id number]  CONSENT:  Informed consent was obtained directly from the patient. Risks, benefits and alternatives were fully discussed. Specific risks including but not limited to bleeding, infection, hematoma, seroma, scarring, pain, infection, wound healing problems, and need for further surgery were all discussed. The patient did have an ample opportunity to have questions answered to satisfaction.   DESCRIPTION OF PROCEDURE:  Local anesthesia was administered. The patient's operative site was prepped and draped in a sterile fashion. A time out was performed and all information was confirmed to be correct.  The lesion right and left face lesions were excised with a 15 blade.  In each case the cystic lesions are very friable and were not able to be removed in one piece but the entire capsule was removed and sent with the pathology specimen.  Hemostasis was obtained.  Circumferential undermining was performed and the skin was advanced and closed in layers with interrupted buried Monocryl sutures and 5-0 Prolene for the skin.  Each lesion was sent separately to pathology.   The patient tolerated the procedure well.  There were no complications.

## 2021-09-08 ENCOUNTER — Telehealth: Payer: Self-pay | Admitting: Internal Medicine

## 2021-09-08 MED ORDER — METOPROLOL TARTRATE 50 MG PO TABS: 50.0000 mg | ORAL_TABLET | Freq: Two times a day (BID) | ORAL | 1 refills | Status: AC

## 2021-09-08 NOTE — Telephone Encounter (Signed)
*  STAT* If patient is at the pharmacy, call can be transferred to refill team.   1. Which medications need to be refilled? (please list name of each medication and dose if known) new prescription for Metoprolol  2. Which pharmacy/location (including street and city if local pharmacy) is medication to be sent to? [place], [place], [place]  3. Do they need a 30 day or 90 day supply? #180 and refills- please call today- she only have 1 pill left

## 2021-09-08 NOTE — Telephone Encounter (Signed)
Completed.

## 2021-09-10 ENCOUNTER — Ambulatory Visit (INDEPENDENT_AMBULATORY_CARE_PROVIDER_SITE_OTHER): Payer: Medicare Other | Admitting: *Deleted

## 2021-09-10 ENCOUNTER — Telehealth: Payer: Self-pay | Admitting: Internal Medicine

## 2021-09-10 ENCOUNTER — Other Ambulatory Visit (HOSPITAL_COMMUNITY)
Admission: RE | Admit: 2021-09-10 | Discharge: 2021-09-10 | Disposition: A | Discharge status: Home / Self Care | Payer: Medicare Other | Source: Ambulatory Visit | Attending: Internal Medicine | Admitting: Internal Medicine

## 2021-09-10 DIAGNOSIS — I4891 Unspecified atrial fibrillation: Secondary | ICD-10-CM | POA: Insufficient documentation

## 2021-09-10 DIAGNOSIS — Z5181 Encounter for therapeutic drug level monitoring: Secondary | ICD-10-CM | POA: Diagnosis not present

## 2021-09-10 LAB — POCT INR: INR: 8 — AB (ref 2.0–3.0)

## 2021-09-10 NOTE — Telephone Encounter (Signed)
Spoke with Clarise Cruz from [facility] lab with a critical lab. Of PT/INR of 5.8.

## 2021-09-10 NOTE — Patient Instructions (Signed)
Hold warfarin tonight and tomorrow then decrease dose to 1 1/2 tablets daily Recheck in 10 days

## 2021-09-10 NOTE — Telephone Encounter (Signed)
[name] called with a critical lab results. Please advise

## 2021-09-11 LAB — PROTIME-INR
INR: 5.8 (ref 0.8–1.2)
Prothrombin Time: 52 seconds — ABNORMAL HIGH (ref 11.4–15.2)

## 2021-09-15 ENCOUNTER — Ambulatory Visit (INDEPENDENT_AMBULATORY_CARE_PROVIDER_SITE_OTHER): Payer: Medicare Other | Admitting: Physician Assistant

## 2021-09-15 ENCOUNTER — Encounter: Payer: Self-pay | Admitting: Physician Assistant

## 2021-09-15 DIAGNOSIS — L720 Epidermal cyst: Secondary | ICD-10-CM

## 2021-09-15 DIAGNOSIS — D489 Neoplasm of uncertain behavior, unspecified: Secondary | ICD-10-CM

## 2021-09-15 NOTE — Progress Notes (Signed)
Patient is a 66 year old female with PMH of cystic lesions on both the left and right side of face s/p excision performed 09/05/2021 by Dr. Erin Hearing who presents to clinic for postprocedural follow-up.  Reviewed procedural note and the 2 excisions were each closed with 5-0 Prolene for the skin.  The specimens were sent to pathology separately.  They were each consistent with epidermoid cysts.  Today, patient is doing well.  No complaints.  She does have some bruising, but states that she is on Coumadin.  She denies any drainage or obvious wounds.  Exam is reassuring.  Excision sites are CDI.  Mild bruising around excision site over right cheek, but no hematoma or cellulitic changes.  Prolene sutures are removed without complication or difficulty.  No specific follow-up needed.  She can certainly call clinic should she have any questions or concerns.  Discussed scar gels to which she is agreeable.

## 2021-09-22 ENCOUNTER — Ambulatory Visit (INDEPENDENT_AMBULATORY_CARE_PROVIDER_SITE_OTHER): Payer: Medicare Other | Admitting: *Deleted

## 2021-09-22 DIAGNOSIS — I4891 Unspecified atrial fibrillation: Secondary | ICD-10-CM

## 2021-09-22 DIAGNOSIS — Z5181 Encounter for therapeutic drug level monitoring: Secondary | ICD-10-CM | POA: Diagnosis not present

## 2021-09-22 LAB — POCT INR: INR: 2.5 (ref 2.0–3.0)

## 2021-09-22 NOTE — Patient Instructions (Signed)
Continue warfarin 1 1/2 tablets daily Recheck in 3 weeks

## 2021-09-24 ENCOUNTER — Encounter (INDEPENDENT_AMBULATORY_CARE_PROVIDER_SITE_OTHER): Payer: Medicare Other | Admitting: Ophthalmology

## 2021-09-24 DIAGNOSIS — H353231 Exudative age-related macular degeneration, bilateral, with active choroidal neovascularization: Secondary | ICD-10-CM | POA: Diagnosis not present

## 2021-09-24 DIAGNOSIS — I10 Essential (primary) hypertension: Secondary | ICD-10-CM

## 2021-09-24 DIAGNOSIS — H43813 Vitreous degeneration, bilateral: Secondary | ICD-10-CM | POA: Diagnosis not present

## 2021-09-24 DIAGNOSIS — D3132 Benign neoplasm of left choroid: Secondary | ICD-10-CM | POA: Diagnosis not present

## 2021-09-24 DIAGNOSIS — H35033 Hypertensive retinopathy, bilateral: Secondary | ICD-10-CM | POA: Diagnosis not present

## 2021-10-10 DIAGNOSIS — Z79899 Other long term (current) drug therapy: Secondary | ICD-10-CM | POA: Diagnosis not present

## 2021-10-10 DIAGNOSIS — E118 Type 2 diabetes mellitus with unspecified complications: Secondary | ICD-10-CM | POA: Diagnosis not present

## 2021-10-10 DIAGNOSIS — I480 Paroxysmal atrial fibrillation: Secondary | ICD-10-CM | POA: Diagnosis not present

## 2021-10-10 DIAGNOSIS — R609 Edema, unspecified: Secondary | ICD-10-CM | POA: Diagnosis not present

## 2021-10-13 ENCOUNTER — Ambulatory Visit (INDEPENDENT_AMBULATORY_CARE_PROVIDER_SITE_OTHER): Payer: Medicare Other | Admitting: *Deleted

## 2021-10-13 DIAGNOSIS — I4891 Unspecified atrial fibrillation: Secondary | ICD-10-CM

## 2021-10-13 DIAGNOSIS — Z5181 Encounter for therapeutic drug level monitoring: Secondary | ICD-10-CM | POA: Diagnosis not present

## 2021-10-13 LAB — POCT INR: INR: 4.2 — AB (ref 2.0–3.0)

## 2021-10-13 NOTE — Patient Instructions (Signed)
Hold warfarin tonight, take 1 tablet tomorrow night then resume 1 1/2 tablets daily Recheck in 3 weeks

## 2021-10-27 DIAGNOSIS — M79675 Pain in left toe(s): Secondary | ICD-10-CM | POA: Diagnosis not present

## 2021-10-27 DIAGNOSIS — E1140 Type 2 diabetes mellitus with diabetic neuropathy, unspecified: Secondary | ICD-10-CM | POA: Diagnosis not present

## 2021-10-27 DIAGNOSIS — L110 Acquired keratosis follicularis: Secondary | ICD-10-CM | POA: Diagnosis not present

## 2021-10-27 DIAGNOSIS — M79671 Pain in right foot: Secondary | ICD-10-CM | POA: Diagnosis not present

## 2021-10-27 DIAGNOSIS — M79672 Pain in left foot: Secondary | ICD-10-CM | POA: Diagnosis not present

## 2021-10-27 DIAGNOSIS — M79674 Pain in right toe(s): Secondary | ICD-10-CM | POA: Diagnosis not present

## 2021-10-29 ENCOUNTER — Encounter (INDEPENDENT_AMBULATORY_CARE_PROVIDER_SITE_OTHER): Payer: Medicare Other | Admitting: Ophthalmology

## 2021-10-29 DIAGNOSIS — H35033 Hypertensive retinopathy, bilateral: Secondary | ICD-10-CM | POA: Diagnosis not present

## 2021-10-29 DIAGNOSIS — D3132 Benign neoplasm of left choroid: Secondary | ICD-10-CM | POA: Diagnosis not present

## 2021-10-29 DIAGNOSIS — H353231 Exudative age-related macular degeneration, bilateral, with active choroidal neovascularization: Secondary | ICD-10-CM

## 2021-10-29 DIAGNOSIS — I10 Essential (primary) hypertension: Secondary | ICD-10-CM

## 2021-10-29 DIAGNOSIS — H43813 Vitreous degeneration, bilateral: Secondary | ICD-10-CM

## 2021-11-03 ENCOUNTER — Ambulatory Visit: Payer: Medicare Other | Attending: Internal Medicine | Admitting: *Deleted

## 2021-11-03 DIAGNOSIS — Z5181 Encounter for therapeutic drug level monitoring: Secondary | ICD-10-CM | POA: Diagnosis not present

## 2021-11-03 DIAGNOSIS — I4891 Unspecified atrial fibrillation: Secondary | ICD-10-CM | POA: Diagnosis not present

## 2021-11-03 LAB — POCT INR: INR: 3.1 — AB (ref 2.0–3.0)

## 2021-11-03 NOTE — Patient Instructions (Signed)
Decrease warfarin to 1 1/2 tablets daily except 1 tablet on Mondays Recheck in 4 weeks

## 2021-11-12 ENCOUNTER — Other Ambulatory Visit: Payer: Self-pay | Admitting: Internal Medicine

## 2021-11-12 NOTE — Telephone Encounter (Signed)
Refill request for warfarin:  Last INR was 3.1 on 11/03/21 Next INR due on 12/02/21 LOV was 04/29/21  Zandra Abts MD  Refill approved.

## 2021-12-03 ENCOUNTER — Encounter (INDEPENDENT_AMBULATORY_CARE_PROVIDER_SITE_OTHER): Payer: Medicare Other | Admitting: Ophthalmology

## 2021-12-26 ENCOUNTER — Ambulatory Visit: Payer: Medicare Other | Admitting: Urology

## 2022-01-08 ENCOUNTER — Ambulatory Visit: Payer: Medicare Other | Admitting: Obstetrics & Gynecology

## 2022-01-23 ENCOUNTER — Ambulatory Visit: Payer: Medicare Other | Admitting: Internal Medicine

## 2022-07-06 IMAGING — MG DIGITAL DIAGNOSTIC BILAT W/ TOMO W/ CAD
8 series · 8 of 24 positions shown · non-contrast
Comparison: Previous exam(s).

ACR Breast Density Category a: The breast tissue is almost entirely
fatty.

CLINICAL DATA: 66-year-old female with a palpable area of concern
in the far outer right breast which she does not definitely feel
today.

EXAM:
DIGITAL DIAGNOSTIC BILATERAL MAMMOGRAM WITH TOMOSYNTHESIS AND CAD;
ULTRASOUND RIGHT BREAST LIMITED
TECHNIQUE: Bilateral digital diagnostic mammography and breast tomosynthesis
was performed. The images were evaluated with computer-aided
detection.; Targeted ultrasound examination of the right breast was
performed

[R MLO synth-2D]
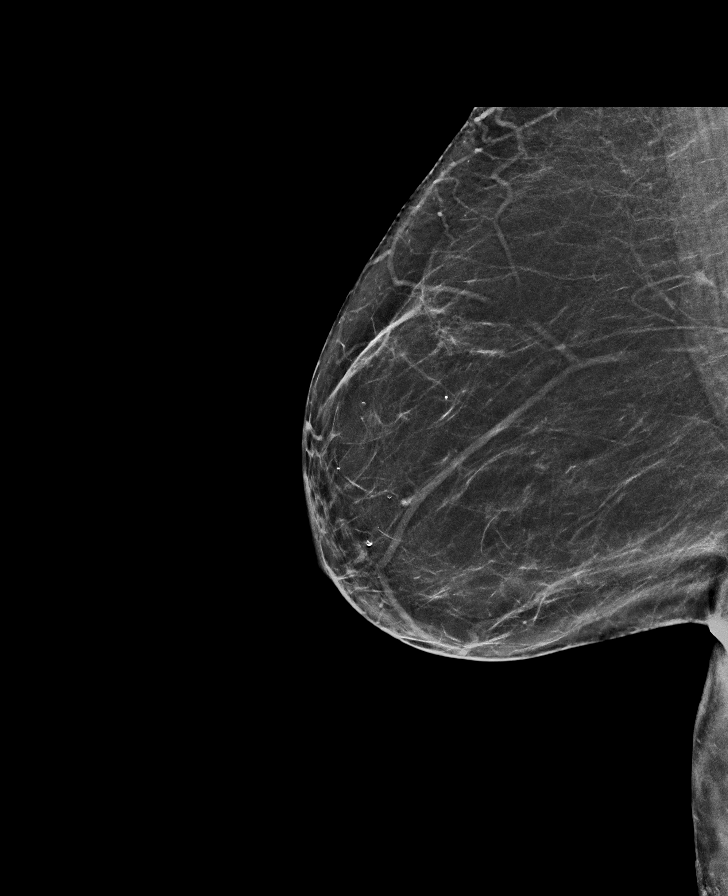

[L MLO synth-2D]
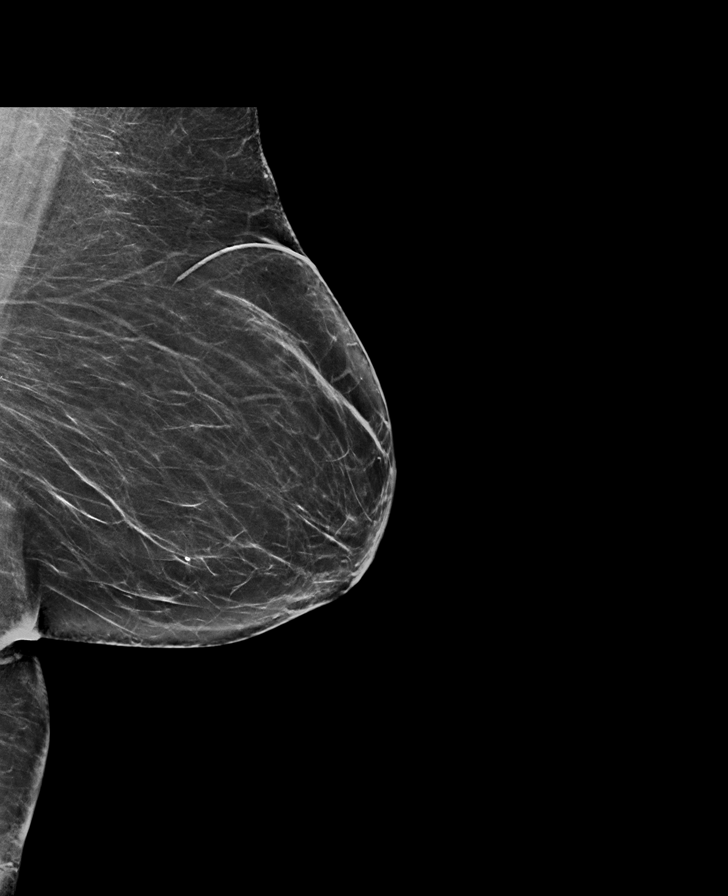

[R CC synth-2D]
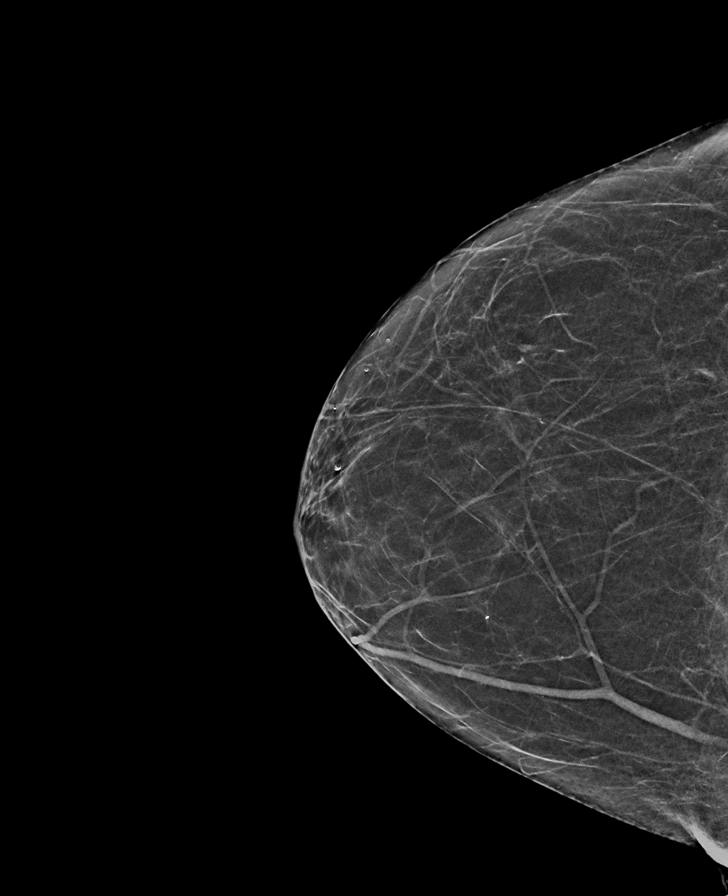

[L CC synth-2D]
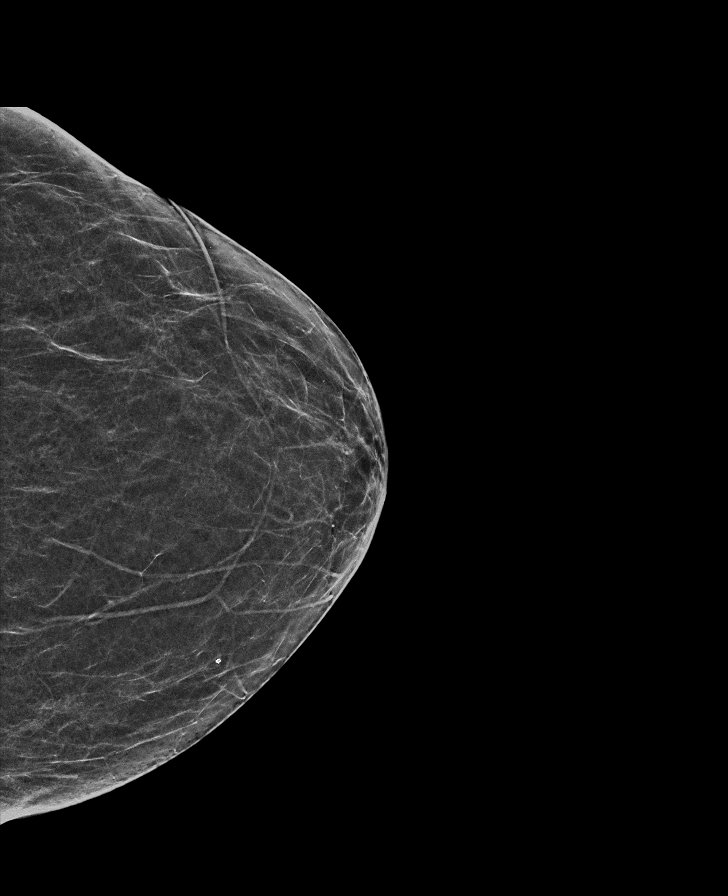

[R CC tomo · tomo slice 27/52.0]
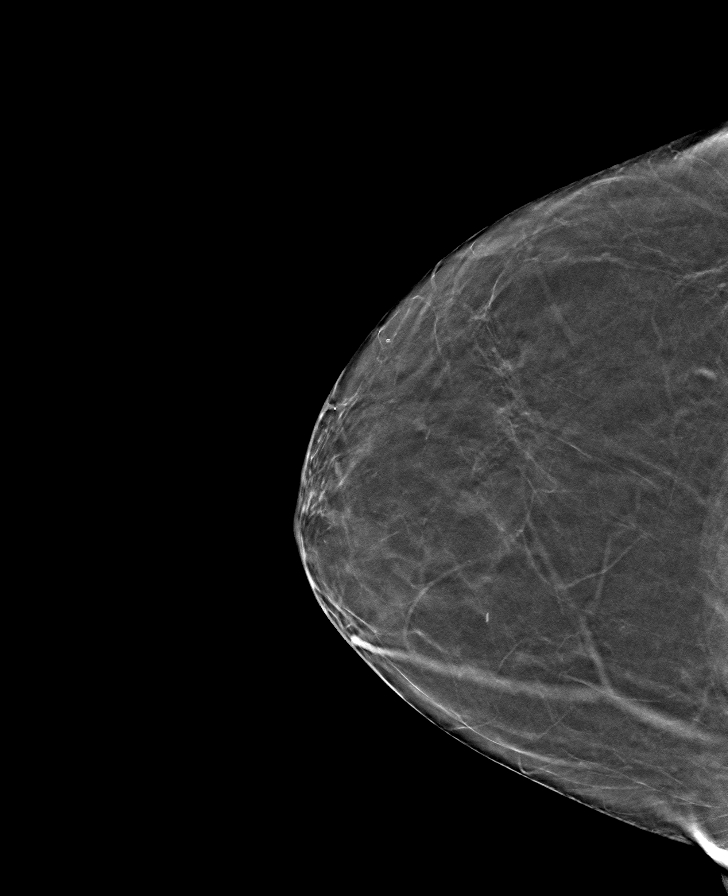

[L MLO tomo · tomo slice 35/70.0]
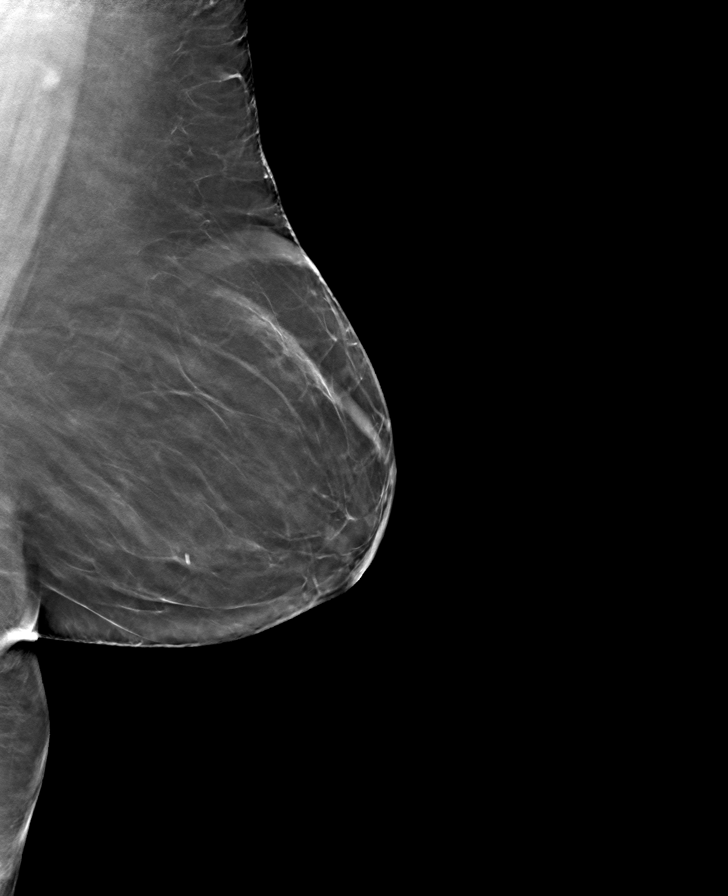

[R MLO tomo · tomo slice 33/64.0]
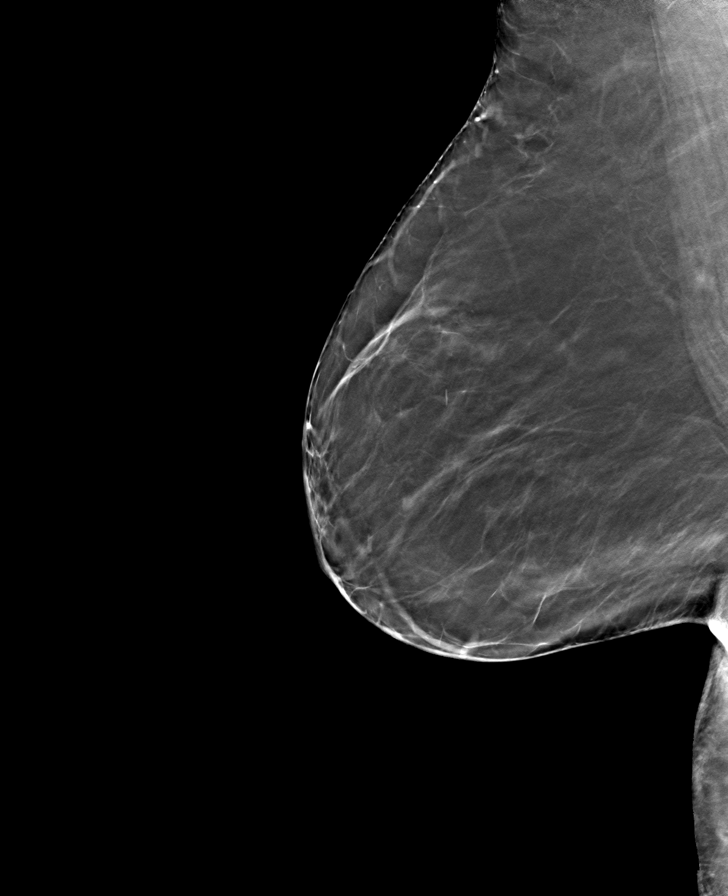

[L CC tomo · tomo slice 27/54.0]
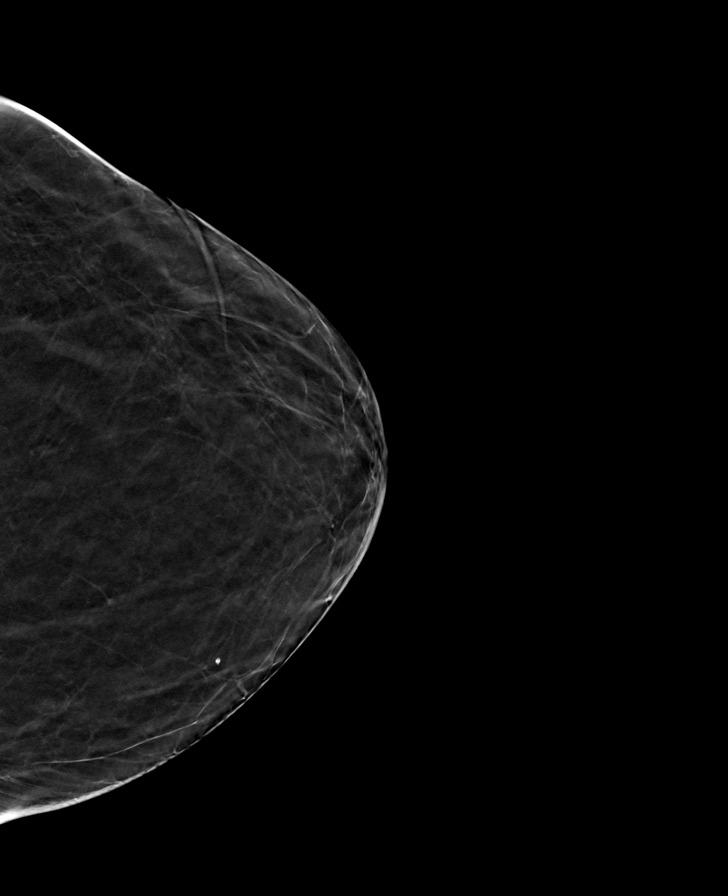

[8 of 24 positions shown; findings below may reference images not displayed]

FINDINGS: No suspicious masses or calcifications are seen in either breast.
There is no mammographic evidence of malignancy in either breast.

Physical examination at site of (previous) palpable concern in the
outer right breast does not reveal any palpable masses.

Targeted ultrasound of the right breast was performed. No suspicious
masses or any other abnormalities seen, only normal-appearing
fibrofatty tissue identified.
IMPRESSION: No findings of malignancy in either breast.

RECOMMENDATION:
Screening mammogram in one year.(Code:PW-[id number])

I have discussed the findings and recommendations with the patient.
If applicable, a reminder letter will be sent to the patient
regarding the next appointment.

BI-RADS CATEGORY  1: Negative.

## 2022-07-06 IMAGING — US US BREAST*R* LIMITED INC AXILLA
1 series · 5 of 5 positions shown · non-contrast
Comparison: Previous exam(s).

ACR Breast Density Category a: The breast tissue is almost entirely
fatty.

CLINICAL DATA: 66-year-old female with a palpable area of concern
in the far outer right breast which she does not definitely feel
today.

EXAM:
DIGITAL DIAGNOSTIC BILATERAL MAMMOGRAM WITH TOMOSYNTHESIS AND CAD;
ULTRASOUND RIGHT BREAST LIMITED
TECHNIQUE: Bilateral digital diagnostic mammography and breast tomosynthesis
was performed. The images were evaluated with computer-aided
detection.; Targeted ultrasound examination of the right breast was
performed

[Series 1: us breast*right* limited inc axilla · 0.07mm/px · 5 of 5 slices shown]
[im 1/5]
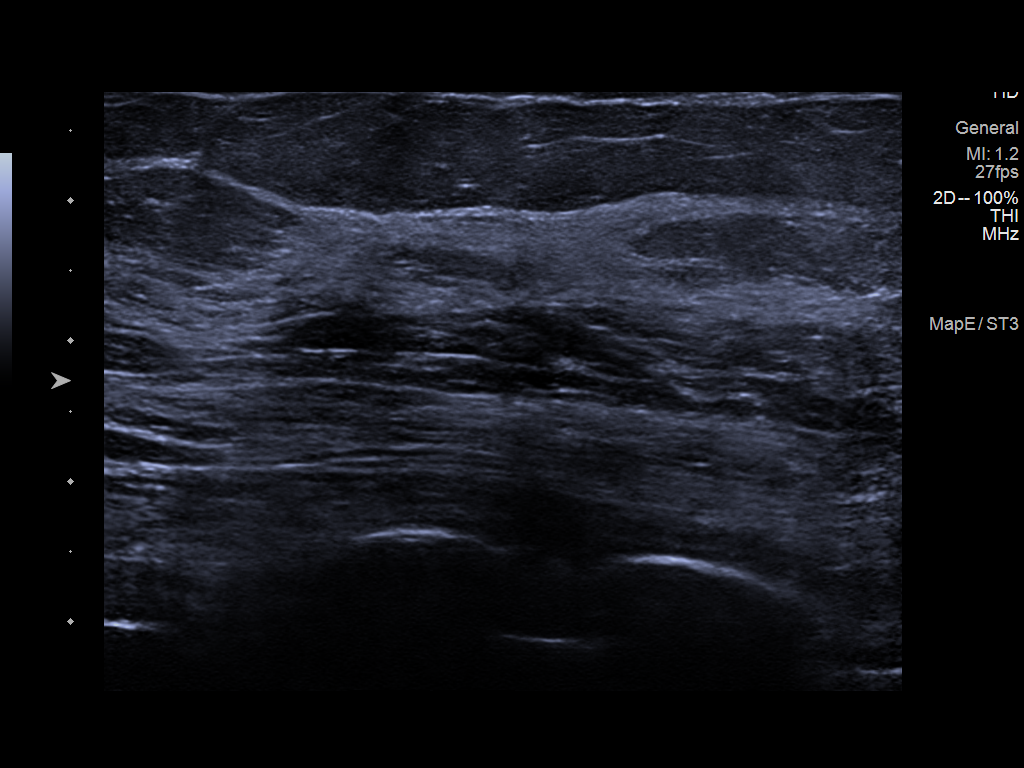
[im 2/5]
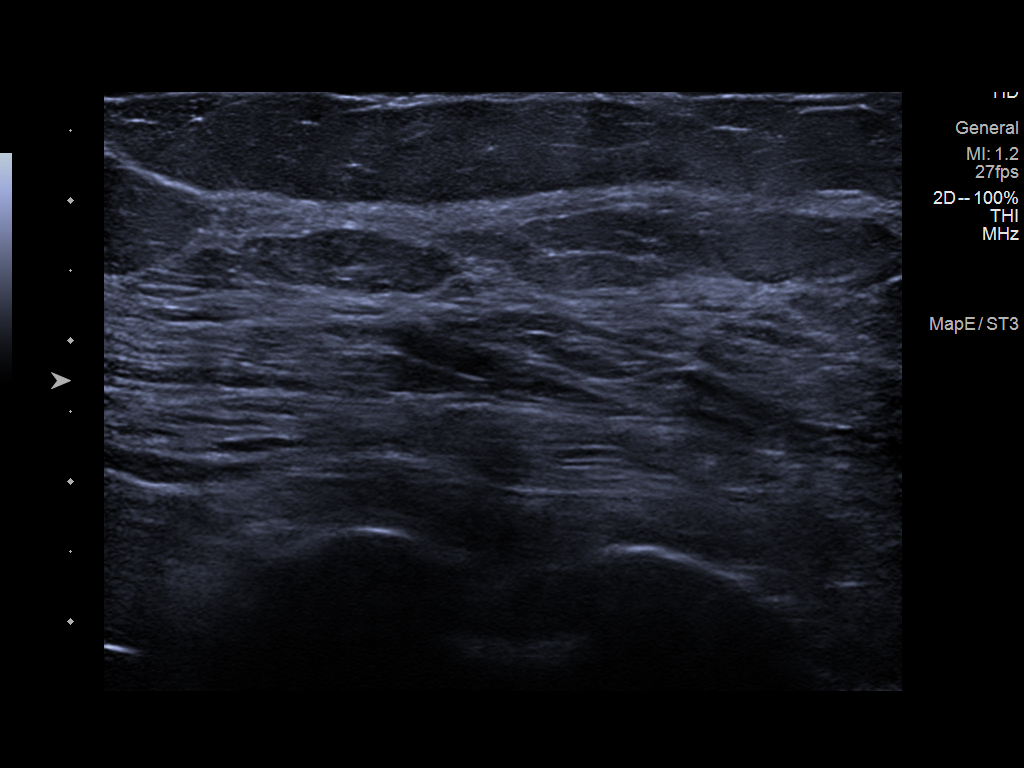
[im 3/5]
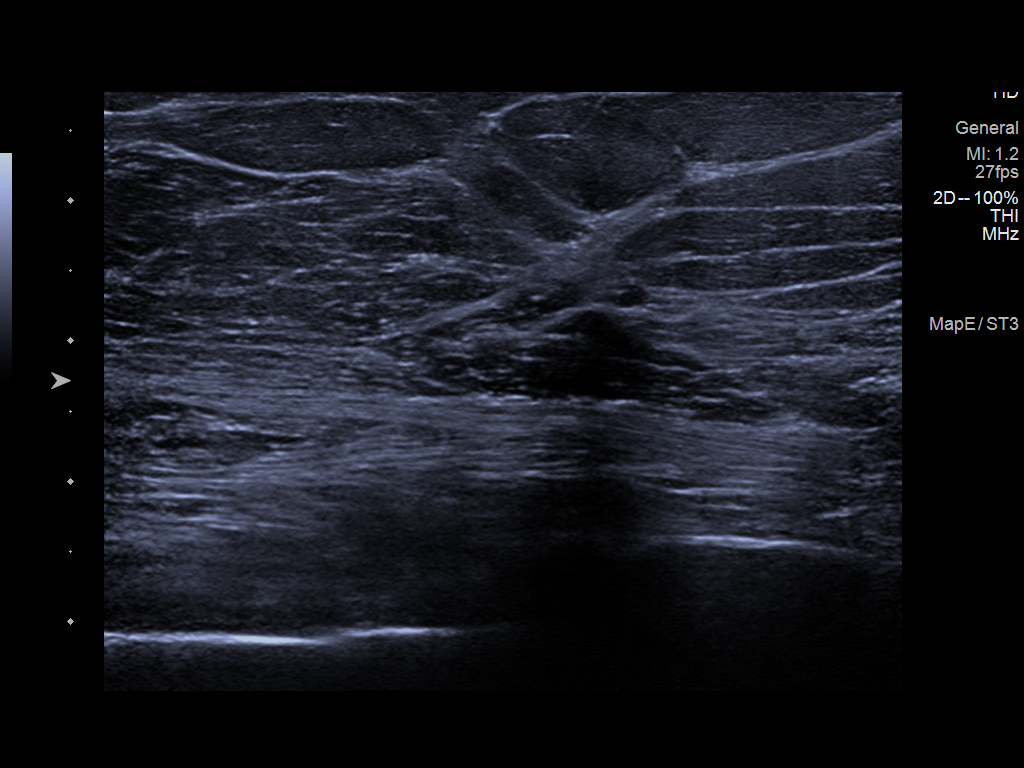
[im 4/5]
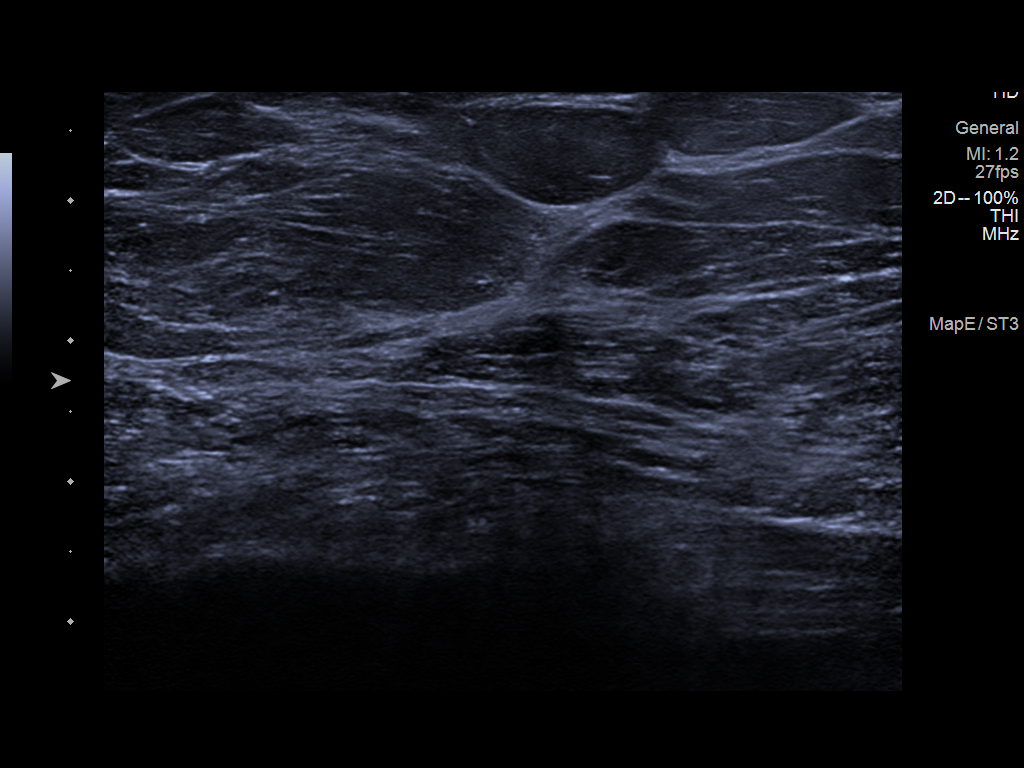
[im 5/5]
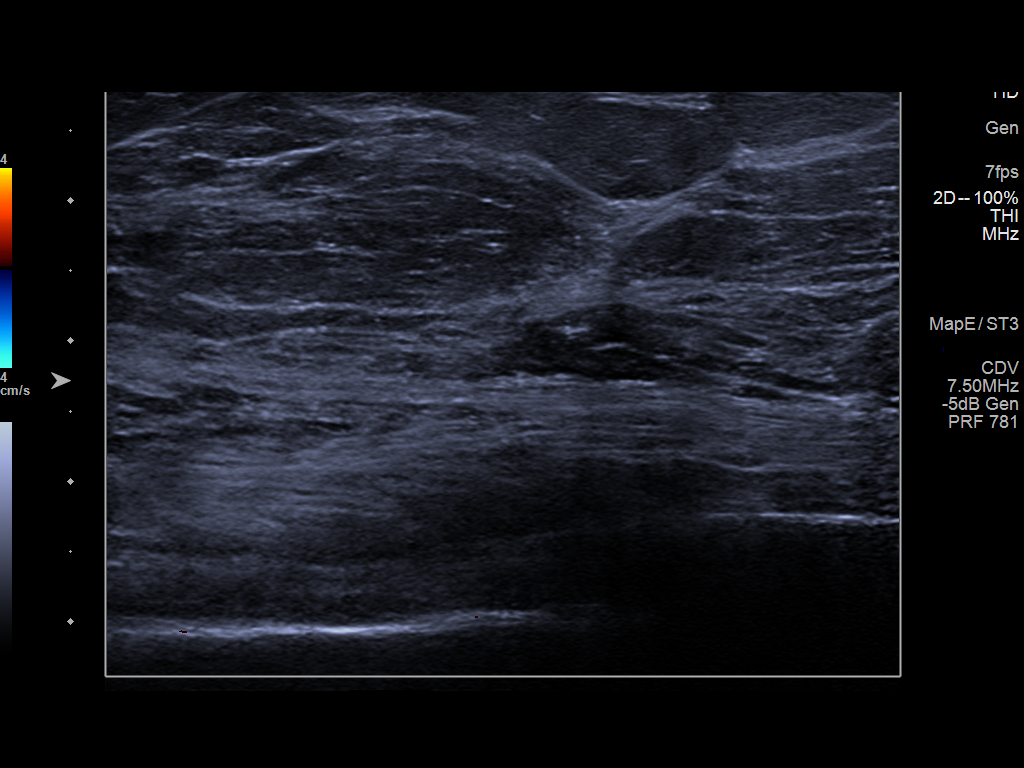

[5 of 5 positions shown; findings below may reference images not displayed]

FINDINGS: No suspicious masses or calcifications are seen in either breast.
There is no mammographic evidence of malignancy in either breast.

Physical examination at site of (previous) palpable concern in the
outer right breast does not reveal any palpable masses.

Targeted ultrasound of the right breast was performed. No suspicious
masses or any other abnormalities seen, only normal-appearing
fibrofatty tissue identified.
IMPRESSION: No findings of malignancy in either breast.

RECOMMENDATION:
Screening mammogram in one year.(Code:[id number])

I have discussed the findings and recommendations with the patient.
If applicable, a reminder letter will be sent to the patient
regarding the next appointment.

BI-RADS CATEGORY  1: Negative.
